# Patient Record
Sex: Female | Born: 1980 | Race: Black or African American | Hispanic: No | Marital: Single | Smoking: Former smoker
Health system: Southern US, Community
[De-identification: ages and names within clinical notes are randomized; demographics above are authoritative.]

## PROBLEM LIST (undated history)

## (undated) ENCOUNTER — Inpatient Hospital Stay (HOSPITAL_COMMUNITY): Payer: Self-pay

## (undated) ENCOUNTER — Emergency Department (HOSPITAL_COMMUNITY): Disposition: A | Discharge status: Home / Self Care | Payer: Self-pay

## (undated) ENCOUNTER — Emergency Department (HOSPITAL_COMMUNITY): Payer: Medicaid Other | Source: Home / Self Care

## (undated) DIAGNOSIS — F419 Anxiety disorder, unspecified: Secondary | ICD-10-CM

## (undated) DIAGNOSIS — F329 Major depressive disorder, single episode, unspecified: Secondary | ICD-10-CM

## (undated) DIAGNOSIS — K589 Irritable bowel syndrome without diarrhea: Secondary | ICD-10-CM

## (undated) DIAGNOSIS — Z87442 Personal history of urinary calculi: Secondary | ICD-10-CM

## (undated) DIAGNOSIS — L309 Dermatitis, unspecified: Secondary | ICD-10-CM

## (undated) DIAGNOSIS — N200 Calculus of kidney: Secondary | ICD-10-CM

## (undated) DIAGNOSIS — R51 Headache: Secondary | ICD-10-CM

## (undated) DIAGNOSIS — J302 Other seasonal allergic rhinitis: Secondary | ICD-10-CM

## (undated) DIAGNOSIS — Z8709 Personal history of other diseases of the respiratory system: Secondary | ICD-10-CM

## (undated) DIAGNOSIS — R519 Headache, unspecified: Secondary | ICD-10-CM

## (undated) DIAGNOSIS — R509 Fever, unspecified: Secondary | ICD-10-CM

## (undated) DIAGNOSIS — B009 Herpesviral infection, unspecified: Secondary | ICD-10-CM

## (undated) DIAGNOSIS — O24419 Gestational diabetes mellitus in pregnancy, unspecified control: Secondary | ICD-10-CM

## (undated) DIAGNOSIS — F32A Depression, unspecified: Secondary | ICD-10-CM

## (undated) DIAGNOSIS — J189 Pneumonia, unspecified organism: Secondary | ICD-10-CM

## (undated) HISTORY — DX: Irritable bowel syndrome, unspecified: K58.9

## (undated) HISTORY — DX: Headache, unspecified: R51.9

## (undated) HISTORY — PX: WISDOM TOOTH EXTRACTION: SHX21

## (undated) HISTORY — PX: HERNIA REPAIR: SHX51

## (undated) HISTORY — DX: Headache: R51

## (undated) HISTORY — PX: TMJ ARTHROPLASTY: SHX1066

---
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
## 2002-01-15 ENCOUNTER — Encounter: Admission: RE | Admit: 2002-01-15 | Discharge: 2002-01-15 | Payer: Self-pay | Admitting: Family Medicine

## 2002-02-05 ENCOUNTER — Encounter: Admission: RE | Admit: 2002-02-05 | Discharge: 2002-02-05 | Payer: Self-pay | Admitting: Family Medicine

## 2002-03-07 ENCOUNTER — Encounter: Admission: RE | Admit: 2002-03-07 | Discharge: 2002-03-07 | Payer: Self-pay | Admitting: Family Medicine

## 2002-03-10 ENCOUNTER — Encounter: Admission: RE | Admit: 2002-03-10 | Discharge: 2002-03-10 | Payer: Self-pay | Admitting: Family Medicine

## 2002-03-16 ENCOUNTER — Emergency Department (HOSPITAL_COMMUNITY): Admission: EM | Admit: 2002-03-16 | Discharge: 2002-03-16 | Payer: Self-pay | Admitting: Emergency Medicine

## 2002-04-03 ENCOUNTER — Encounter: Admission: RE | Admit: 2002-04-03 | Discharge: 2002-04-03 | Payer: Self-pay | Admitting: Family Medicine

## 2002-04-04 ENCOUNTER — Emergency Department (HOSPITAL_COMMUNITY): Admission: EM | Admit: 2002-04-04 | Discharge: 2002-04-04 | Payer: Self-pay | Admitting: Emergency Medicine

## 2002-05-29 ENCOUNTER — Inpatient Hospital Stay (HOSPITAL_COMMUNITY): Admission: AD | Admit: 2002-05-29 | Discharge: 2002-05-29 | Payer: Self-pay | Admitting: *Deleted

## 2002-06-04 ENCOUNTER — Encounter: Admission: RE | Admit: 2002-06-04 | Discharge: 2002-06-04 | Payer: Self-pay | Admitting: Family Medicine

## 2002-06-26 ENCOUNTER — Encounter: Admission: RE | Admit: 2002-06-26 | Discharge: 2002-06-26 | Payer: Self-pay | Admitting: Family Medicine

## 2002-06-30 ENCOUNTER — Inpatient Hospital Stay (HOSPITAL_COMMUNITY): Admission: AD | Admit: 2002-06-30 | Discharge: 2002-06-30 | Payer: Self-pay | Admitting: *Deleted

## 2002-07-21 ENCOUNTER — Emergency Department (HOSPITAL_COMMUNITY): Admission: EM | Admit: 2002-07-21 | Discharge: 2002-07-21 | Payer: Self-pay | Admitting: Emergency Medicine

## 2002-08-11 ENCOUNTER — Encounter: Admission: RE | Admit: 2002-08-11 | Discharge: 2002-08-11 | Payer: Self-pay | Admitting: Family Medicine

## 2002-10-09 ENCOUNTER — Emergency Department (HOSPITAL_COMMUNITY): Admission: EM | Admit: 2002-10-09 | Discharge: 2002-10-09 | Payer: Self-pay | Admitting: Emergency Medicine

## 2002-12-24 ENCOUNTER — Encounter: Admission: RE | Admit: 2002-12-24 | Discharge: 2002-12-24 | Payer: Self-pay | Admitting: Family Medicine

## 2002-12-24 ENCOUNTER — Other Ambulatory Visit: Admission: RE | Admit: 2002-12-24 | Discharge: 2002-12-24 | Payer: Self-pay | Admitting: Family Medicine

## 2003-01-15 ENCOUNTER — Encounter: Payer: Self-pay | Admitting: Emergency Medicine

## 2003-01-15 ENCOUNTER — Emergency Department (HOSPITAL_COMMUNITY): Admission: EM | Admit: 2003-01-15 | Discharge: 2003-01-15 | Payer: Self-pay | Admitting: Emergency Medicine

## 2003-01-16 ENCOUNTER — Encounter: Payer: Self-pay | Admitting: Emergency Medicine

## 2003-01-21 ENCOUNTER — Encounter: Payer: Self-pay | Admitting: Obstetrics and Gynecology

## 2003-01-21 ENCOUNTER — Inpatient Hospital Stay (HOSPITAL_COMMUNITY): Admission: AD | Admit: 2003-01-21 | Discharge: 2003-01-21 | Payer: Self-pay | Admitting: Obstetrics and Gynecology

## 2003-01-28 ENCOUNTER — Encounter: Payer: Self-pay | Admitting: Obstetrics and Gynecology

## 2003-01-28 ENCOUNTER — Inpatient Hospital Stay (HOSPITAL_COMMUNITY): Admission: AD | Admit: 2003-01-28 | Discharge: 2003-01-28 | Payer: Self-pay | Admitting: Obstetrics and Gynecology

## 2003-01-28 ENCOUNTER — Ambulatory Visit (HOSPITAL_COMMUNITY): Admission: RE | Admit: 2003-01-28 | Discharge: 2003-01-28 | Payer: Self-pay | Admitting: Obstetrics and Gynecology

## 2003-02-11 ENCOUNTER — Encounter: Admission: RE | Admit: 2003-02-11 | Discharge: 2003-02-11 | Payer: Self-pay

## 2003-02-17 ENCOUNTER — Inpatient Hospital Stay (HOSPITAL_COMMUNITY): Admission: AD | Admit: 2003-02-17 | Discharge: 2003-02-17 | Payer: Self-pay | Admitting: *Deleted

## 2003-02-18 ENCOUNTER — Encounter: Admission: RE | Admit: 2003-02-18 | Discharge: 2003-02-18 | Payer: Self-pay | Admitting: Family Medicine

## 2003-02-18 ENCOUNTER — Other Ambulatory Visit: Admission: RE | Admit: 2003-02-18 | Discharge: 2003-02-18 | Payer: Self-pay | Admitting: Family Medicine

## 2003-03-02 ENCOUNTER — Inpatient Hospital Stay (HOSPITAL_COMMUNITY): Admission: AD | Admit: 2003-03-02 | Discharge: 2003-03-02 | Payer: Self-pay | Admitting: Family Medicine

## 2003-03-06 ENCOUNTER — Inpatient Hospital Stay (HOSPITAL_COMMUNITY): Admission: AD | Admit: 2003-03-06 | Discharge: 2003-03-06 | Payer: Self-pay | Admitting: *Deleted

## 2003-03-20 ENCOUNTER — Encounter: Admission: RE | Admit: 2003-03-20 | Discharge: 2003-03-20 | Payer: Self-pay | Admitting: Family Medicine

## 2003-04-23 ENCOUNTER — Encounter: Admission: RE | Admit: 2003-04-23 | Discharge: 2003-04-23 | Payer: Self-pay | Admitting: Family Medicine

## 2003-04-23 ENCOUNTER — Inpatient Hospital Stay (HOSPITAL_COMMUNITY): Admission: AD | Admit: 2003-04-23 | Discharge: 2003-04-23 | Payer: Self-pay | Admitting: Obstetrics and Gynecology

## 2003-04-27 ENCOUNTER — Ambulatory Visit (HOSPITAL_COMMUNITY): Admission: RE | Admit: 2003-04-27 | Discharge: 2003-04-27 | Payer: Self-pay | Admitting: *Deleted

## 2003-04-29 ENCOUNTER — Encounter: Admission: RE | Admit: 2003-04-29 | Discharge: 2003-04-29 | Payer: Self-pay | Admitting: Family Medicine

## 2003-05-03 ENCOUNTER — Inpatient Hospital Stay (HOSPITAL_COMMUNITY): Admission: AD | Admit: 2003-05-03 | Discharge: 2003-05-03 | Payer: Self-pay | Admitting: *Deleted

## 2003-05-25 ENCOUNTER — Encounter: Admission: RE | Admit: 2003-05-25 | Discharge: 2003-05-25 | Payer: Self-pay | Admitting: Family Medicine

## 2003-06-15 ENCOUNTER — Inpatient Hospital Stay (HOSPITAL_COMMUNITY): Admission: AD | Admit: 2003-06-15 | Discharge: 2003-06-15 | Payer: Self-pay | Admitting: Obstetrics & Gynecology

## 2003-06-22 ENCOUNTER — Encounter: Admission: RE | Admit: 2003-06-22 | Discharge: 2003-06-22 | Payer: Self-pay | Admitting: Family Medicine

## 2003-06-28 ENCOUNTER — Inpatient Hospital Stay (HOSPITAL_COMMUNITY): Admission: AD | Admit: 2003-06-28 | Discharge: 2003-06-28 | Payer: Self-pay | Admitting: Obstetrics and Gynecology

## 2003-07-10 ENCOUNTER — Encounter: Admission: RE | Admit: 2003-07-10 | Discharge: 2003-07-10 | Payer: Self-pay | Admitting: Family Medicine

## 2003-07-14 ENCOUNTER — Inpatient Hospital Stay (HOSPITAL_COMMUNITY): Admission: AD | Admit: 2003-07-14 | Discharge: 2003-07-14 | Payer: Self-pay | Admitting: *Deleted

## 2003-07-24 ENCOUNTER — Encounter: Admission: RE | Admit: 2003-07-24 | Discharge: 2003-07-24 | Payer: Self-pay | Admitting: Family Medicine

## 2003-07-25 ENCOUNTER — Inpatient Hospital Stay (HOSPITAL_COMMUNITY): Admission: AD | Admit: 2003-07-25 | Discharge: 2003-07-26 | Payer: Self-pay | Admitting: *Deleted

## 2003-07-25 ENCOUNTER — Encounter: Payer: Self-pay | Admitting: Emergency Medicine

## 2003-08-07 ENCOUNTER — Encounter: Admission: RE | Admit: 2003-08-07 | Discharge: 2003-08-07 | Payer: Self-pay | Admitting: Family Medicine

## 2003-08-10 ENCOUNTER — Inpatient Hospital Stay (HOSPITAL_COMMUNITY): Admission: AD | Admit: 2003-08-10 | Discharge: 2003-08-11 | Payer: Self-pay | Admitting: Obstetrics and Gynecology

## 2003-08-13 ENCOUNTER — Inpatient Hospital Stay (HOSPITAL_COMMUNITY): Admission: AD | Admit: 2003-08-13 | Discharge: 2003-08-13 | Payer: Self-pay | Admitting: Obstetrics & Gynecology

## 2003-08-14 ENCOUNTER — Encounter: Payer: Self-pay | Admitting: Obstetrics & Gynecology

## 2003-08-14 ENCOUNTER — Inpatient Hospital Stay (HOSPITAL_COMMUNITY): Admission: AD | Admit: 2003-08-14 | Discharge: 2003-08-14 | Payer: Self-pay | Admitting: Obstetrics & Gynecology

## 2003-08-21 ENCOUNTER — Encounter: Admission: RE | Admit: 2003-08-21 | Discharge: 2003-08-21 | Payer: Self-pay | Admitting: Family Medicine

## 2003-08-27 ENCOUNTER — Inpatient Hospital Stay (HOSPITAL_COMMUNITY): Admission: AD | Admit: 2003-08-27 | Discharge: 2003-08-27 | Payer: Self-pay | Admitting: Family Medicine

## 2003-09-02 ENCOUNTER — Encounter: Admission: RE | Admit: 2003-09-02 | Discharge: 2003-09-02 | Payer: Self-pay | Admitting: Sports Medicine

## 2003-09-09 ENCOUNTER — Encounter: Admission: RE | Admit: 2003-09-09 | Discharge: 2003-09-09 | Payer: Self-pay | Admitting: Family Medicine

## 2003-09-09 ENCOUNTER — Inpatient Hospital Stay (HOSPITAL_COMMUNITY): Admission: AD | Admit: 2003-09-09 | Discharge: 2003-09-09 | Payer: Self-pay | Admitting: Obstetrics and Gynecology

## 2003-09-16 ENCOUNTER — Encounter: Admission: RE | Admit: 2003-09-16 | Discharge: 2003-09-16 | Payer: Self-pay | Admitting: Family Medicine

## 2003-09-18 ENCOUNTER — Encounter (INDEPENDENT_AMBULATORY_CARE_PROVIDER_SITE_OTHER): Payer: Self-pay

## 2003-09-18 ENCOUNTER — Inpatient Hospital Stay (HOSPITAL_COMMUNITY): Admission: RE | Admit: 2003-09-18 | Discharge: 2003-09-21 | Payer: Self-pay | Admitting: Family Medicine

## 2003-11-23 ENCOUNTER — Encounter: Admission: RE | Admit: 2003-11-23 | Discharge: 2003-11-23 | Payer: Self-pay | Admitting: Family Medicine

## 2003-12-30 ENCOUNTER — Encounter: Admission: RE | Admit: 2003-12-30 | Discharge: 2003-12-30 | Payer: Self-pay | Admitting: Family Medicine

## 2004-02-24 ENCOUNTER — Encounter: Admission: RE | Admit: 2004-02-24 | Discharge: 2004-02-24 | Payer: Self-pay | Admitting: Family Medicine

## 2004-03-16 ENCOUNTER — Encounter: Admission: RE | Admit: 2004-03-16 | Discharge: 2004-03-16 | Payer: Self-pay | Admitting: Family Medicine

## 2004-03-30 ENCOUNTER — Encounter: Admission: RE | Admit: 2004-03-30 | Discharge: 2004-03-30 | Payer: Self-pay | Admitting: Family Medicine

## 2004-04-13 ENCOUNTER — Encounter: Admission: RE | Admit: 2004-04-13 | Discharge: 2004-04-13 | Payer: Self-pay | Admitting: Family Medicine

## 2004-05-24 ENCOUNTER — Emergency Department (HOSPITAL_COMMUNITY): Admission: EM | Admit: 2004-05-24 | Discharge: 2004-05-24 | Payer: Self-pay | Admitting: Emergency Medicine

## 2004-06-26 ENCOUNTER — Emergency Department (HOSPITAL_COMMUNITY): Admission: EM | Admit: 2004-06-26 | Discharge: 2004-06-26 | Payer: Self-pay | Admitting: Emergency Medicine

## 2004-06-27 ENCOUNTER — Encounter (INDEPENDENT_AMBULATORY_CARE_PROVIDER_SITE_OTHER): Payer: Self-pay | Admitting: *Deleted

## 2004-06-27 LAB — CONVERTED CEMR LAB

## 2004-07-20 ENCOUNTER — Encounter (INDEPENDENT_AMBULATORY_CARE_PROVIDER_SITE_OTHER): Payer: Self-pay | Admitting: Family Medicine

## 2004-07-20 ENCOUNTER — Encounter: Admission: RE | Admit: 2004-07-20 | Discharge: 2004-07-20 | Payer: Self-pay | Admitting: Family Medicine

## 2004-07-27 ENCOUNTER — Encounter: Admission: RE | Admit: 2004-07-27 | Discharge: 2004-07-27 | Payer: Self-pay | Admitting: Family Medicine

## 2004-08-31 ENCOUNTER — Emergency Department (HOSPITAL_COMMUNITY): Admission: EM | Admit: 2004-08-31 | Discharge: 2004-08-31 | Payer: Self-pay | Admitting: Family Medicine

## 2004-09-29 ENCOUNTER — Ambulatory Visit: Payer: Self-pay | Admitting: Family Medicine

## 2004-10-05 ENCOUNTER — Ambulatory Visit: Payer: Self-pay | Admitting: Family Medicine

## 2004-11-17 ENCOUNTER — Emergency Department (HOSPITAL_COMMUNITY): Admission: EM | Admit: 2004-11-17 | Discharge: 2004-11-17 | Payer: Self-pay | Admitting: Family Medicine

## 2004-11-26 ENCOUNTER — Emergency Department (HOSPITAL_COMMUNITY): Admission: EM | Admit: 2004-11-26 | Discharge: 2004-11-26 | Payer: Self-pay | Admitting: Family Medicine

## 2005-08-24 ENCOUNTER — Inpatient Hospital Stay (HOSPITAL_COMMUNITY): Admission: AD | Admit: 2005-08-24 | Discharge: 2005-08-24 | Payer: Self-pay | Admitting: Obstetrics

## 2005-12-06 ENCOUNTER — Inpatient Hospital Stay (HOSPITAL_COMMUNITY): Admission: AD | Admit: 2005-12-06 | Discharge: 2005-12-06 | Payer: Self-pay | Admitting: Obstetrics

## 2005-12-11 ENCOUNTER — Emergency Department (HOSPITAL_COMMUNITY): Admission: EM | Admit: 2005-12-11 | Discharge: 2005-12-11 | Payer: Self-pay | Admitting: Family Medicine

## 2006-01-10 ENCOUNTER — Ambulatory Visit (HOSPITAL_COMMUNITY): Admission: RE | Admit: 2006-01-10 | Discharge: 2006-01-10 | Payer: Self-pay | Admitting: Obstetrics

## 2006-03-14 ENCOUNTER — Ambulatory Visit (HOSPITAL_BASED_OUTPATIENT_CLINIC_OR_DEPARTMENT_OTHER): Admission: RE | Admit: 2006-03-14 | Discharge: 2006-03-14 | Payer: Self-pay | Admitting: General Surgery

## 2006-04-13 ENCOUNTER — Inpatient Hospital Stay (HOSPITAL_COMMUNITY): Admission: AD | Admit: 2006-04-13 | Discharge: 2006-04-13 | Payer: Self-pay | Admitting: Obstetrics & Gynecology

## 2006-06-23 ENCOUNTER — Emergency Department (HOSPITAL_COMMUNITY): Admission: EM | Admit: 2006-06-23 | Discharge: 2006-06-23 | Payer: Self-pay | Admitting: Emergency Medicine

## 2006-10-06 ENCOUNTER — Emergency Department (HOSPITAL_COMMUNITY): Admission: EM | Admit: 2006-10-06 | Discharge: 2006-10-06 | Payer: Self-pay | Admitting: Family Medicine

## 2006-11-27 DIAGNOSIS — B009 Herpesviral infection, unspecified: Secondary | ICD-10-CM

## 2006-11-27 HISTORY — DX: Herpesviral infection, unspecified: B00.9

## 2007-01-03 ENCOUNTER — Encounter: Admission: RE | Admit: 2007-01-03 | Discharge: 2007-01-03 | Payer: Self-pay | Admitting: Internal Medicine

## 2007-01-16 ENCOUNTER — Encounter: Admission: RE | Admit: 2007-01-16 | Discharge: 2007-01-16 | Payer: Self-pay | Admitting: Internal Medicine

## 2007-01-24 DIAGNOSIS — A630 Anogenital (venereal) warts: Secondary | ICD-10-CM | POA: Insufficient documentation

## 2007-01-25 ENCOUNTER — Encounter (INDEPENDENT_AMBULATORY_CARE_PROVIDER_SITE_OTHER): Payer: Self-pay | Admitting: *Deleted

## 2007-02-14 ENCOUNTER — Emergency Department (HOSPITAL_COMMUNITY): Admission: EM | Admit: 2007-02-14 | Discharge: 2007-02-14 | Payer: Self-pay | Admitting: Family Medicine

## 2007-03-18 ENCOUNTER — Emergency Department (HOSPITAL_COMMUNITY): Admission: EM | Admit: 2007-03-18 | Discharge: 2007-03-18 | Payer: Self-pay | Admitting: Family Medicine

## 2007-05-27 ENCOUNTER — Emergency Department (HOSPITAL_COMMUNITY): Admission: EM | Admit: 2007-05-27 | Discharge: 2007-05-27 | Payer: Self-pay | Admitting: Emergency Medicine

## 2007-05-31 ENCOUNTER — Inpatient Hospital Stay (HOSPITAL_COMMUNITY): Admission: AD | Admit: 2007-05-31 | Discharge: 2007-05-31 | Payer: Self-pay | Admitting: Obstetrics & Gynecology

## 2007-06-01 ENCOUNTER — Inpatient Hospital Stay (HOSPITAL_COMMUNITY): Admission: AD | Admit: 2007-06-01 | Discharge: 2007-06-01 | Payer: Self-pay | Admitting: Obstetrics

## 2007-06-01 ENCOUNTER — Emergency Department (HOSPITAL_COMMUNITY): Admission: EM | Admit: 2007-06-01 | Discharge: 2007-06-01 | Payer: Self-pay | Admitting: Emergency Medicine

## 2007-06-30 ENCOUNTER — Inpatient Hospital Stay (HOSPITAL_COMMUNITY): Admission: AD | Admit: 2007-06-30 | Discharge: 2007-06-30 | Payer: Self-pay | Admitting: Obstetrics

## 2007-09-02 ENCOUNTER — Other Ambulatory Visit: Payer: Self-pay

## 2007-09-02 ENCOUNTER — Other Ambulatory Visit: Payer: Self-pay | Admitting: Emergency Medicine

## 2007-10-18 ENCOUNTER — Emergency Department (HOSPITAL_COMMUNITY): Admission: EM | Admit: 2007-10-18 | Discharge: 2007-10-18 | Payer: Self-pay | Admitting: Family Medicine

## 2007-12-22 ENCOUNTER — Emergency Department (HOSPITAL_COMMUNITY): Admission: EM | Admit: 2007-12-22 | Discharge: 2007-12-22 | Payer: Self-pay | Admitting: Emergency Medicine

## 2008-02-11 ENCOUNTER — Inpatient Hospital Stay (HOSPITAL_COMMUNITY): Admission: AD | Admit: 2008-02-11 | Discharge: 2008-02-11 | Payer: Self-pay | Admitting: Obstetrics & Gynecology

## 2008-02-18 ENCOUNTER — Inpatient Hospital Stay (HOSPITAL_COMMUNITY): Admission: AD | Admit: 2008-02-18 | Discharge: 2008-02-18 | Payer: Self-pay | Admitting: Obstetrics & Gynecology

## 2008-03-10 ENCOUNTER — Emergency Department (HOSPITAL_COMMUNITY): Admission: EM | Admit: 2008-03-10 | Discharge: 2008-03-10 | Payer: Self-pay | Admitting: Emergency Medicine

## 2008-05-06 ENCOUNTER — Emergency Department (HOSPITAL_COMMUNITY): Admission: EM | Admit: 2008-05-06 | Discharge: 2008-05-06 | Payer: Self-pay | Admitting: Family Medicine

## 2008-06-22 ENCOUNTER — Encounter: Payer: Self-pay | Admitting: *Deleted

## 2008-08-25 ENCOUNTER — Emergency Department (HOSPITAL_COMMUNITY): Admission: EM | Admit: 2008-08-25 | Discharge: 2008-08-25 | Payer: Self-pay | Admitting: Emergency Medicine

## 2008-09-07 ENCOUNTER — Emergency Department (HOSPITAL_COMMUNITY): Admission: EM | Admit: 2008-09-07 | Discharge: 2008-09-07 | Payer: Self-pay | Admitting: Emergency Medicine

## 2008-09-21 ENCOUNTER — Inpatient Hospital Stay (HOSPITAL_COMMUNITY): Admission: AD | Admit: 2008-09-21 | Discharge: 2008-09-21 | Payer: Self-pay | Admitting: Obstetrics

## 2009-02-01 ENCOUNTER — Emergency Department (HOSPITAL_COMMUNITY): Admission: EM | Admit: 2009-02-01 | Discharge: 2009-02-01 | Payer: Self-pay | Admitting: Family Medicine

## 2009-02-24 ENCOUNTER — Emergency Department (HOSPITAL_COMMUNITY): Admission: EM | Admit: 2009-02-24 | Discharge: 2009-02-24 | Payer: Self-pay | Admitting: Emergency Medicine

## 2009-02-26 ENCOUNTER — Inpatient Hospital Stay (HOSPITAL_COMMUNITY): Admission: AD | Admit: 2009-02-26 | Discharge: 2009-02-26 | Payer: Self-pay | Admitting: Obstetrics & Gynecology

## 2009-05-12 ENCOUNTER — Emergency Department (HOSPITAL_COMMUNITY): Admission: EM | Admit: 2009-05-12 | Discharge: 2009-05-12 | Payer: Self-pay | Admitting: Family Medicine

## 2009-05-25 ENCOUNTER — Inpatient Hospital Stay (HOSPITAL_COMMUNITY): Admission: AD | Admit: 2009-05-25 | Discharge: 2009-05-25 | Payer: Self-pay | Admitting: Obstetrics & Gynecology

## 2009-07-23 ENCOUNTER — Emergency Department (HOSPITAL_COMMUNITY): Admission: EM | Admit: 2009-07-23 | Discharge: 2009-07-23 | Payer: Self-pay | Admitting: Family Medicine

## 2009-09-21 ENCOUNTER — Emergency Department (HOSPITAL_COMMUNITY): Admission: EM | Admit: 2009-09-21 | Discharge: 2009-09-21 | Payer: Self-pay | Admitting: Family Medicine

## 2010-02-08 ENCOUNTER — Emergency Department (HOSPITAL_COMMUNITY): Admission: EM | Admit: 2010-02-08 | Discharge: 2010-02-08 | Payer: Self-pay | Admitting: Emergency Medicine

## 2010-02-16 ENCOUNTER — Ambulatory Visit (HOSPITAL_COMMUNITY): Admission: RE | Admit: 2010-02-16 | Discharge: 2010-02-16 | Payer: Self-pay | Admitting: Cardiology

## 2010-02-16 ENCOUNTER — Encounter (INDEPENDENT_AMBULATORY_CARE_PROVIDER_SITE_OTHER): Payer: Self-pay | Admitting: Cardiology

## 2010-04-18 ENCOUNTER — Encounter: Admission: RE | Admit: 2010-04-18 | Discharge: 2010-04-18 | Payer: Self-pay | Admitting: Gastroenterology

## 2010-05-24 ENCOUNTER — Ambulatory Visit (HOSPITAL_COMMUNITY): Admission: RE | Admit: 2010-05-24 | Discharge: 2010-05-24 | Payer: Self-pay | Admitting: Obstetrics

## 2010-06-16 ENCOUNTER — Emergency Department (HOSPITAL_COMMUNITY): Admission: EM | Admit: 2010-06-16 | Discharge: 2010-06-16 | Payer: Self-pay | Admitting: Emergency Medicine

## 2010-07-11 ENCOUNTER — Encounter: Admission: RE | Admit: 2010-07-11 | Discharge: 2010-07-11 | Payer: Self-pay | Admitting: Oral Surgery

## 2010-11-22 ENCOUNTER — Encounter
Admission: RE | Admit: 2010-11-22 | Discharge: 2010-11-22 | Payer: Self-pay | Source: Home / Self Care | Attending: Obstetrics | Admitting: Obstetrics

## 2010-12-18 ENCOUNTER — Encounter: Payer: Self-pay | Admitting: Family Medicine

## 2010-12-18 ENCOUNTER — Encounter: Payer: Self-pay | Admitting: Cardiology

## 2010-12-18 ENCOUNTER — Encounter: Payer: Self-pay | Admitting: Gastroenterology

## 2011-01-26 DIAGNOSIS — R509 Fever, unspecified: Secondary | ICD-10-CM

## 2011-01-26 HISTORY — DX: Fever, unspecified: R50.9

## 2011-01-30 ENCOUNTER — Inpatient Hospital Stay (INDEPENDENT_AMBULATORY_CARE_PROVIDER_SITE_OTHER)
Admission: RE | Admit: 2011-01-30 | Discharge: 2011-01-30 | Disposition: A | Discharge status: Home / Self Care | Payer: Medicaid Other | Source: Ambulatory Visit | Attending: Family Medicine | Admitting: Family Medicine

## 2011-01-30 DIAGNOSIS — B373 Candidiasis of vulva and vagina: Secondary | ICD-10-CM

## 2011-01-30 DIAGNOSIS — B3731 Acute candidiasis of vulva and vagina: Secondary | ICD-10-CM

## 2011-01-30 DIAGNOSIS — N760 Acute vaginitis: Secondary | ICD-10-CM

## 2011-01-30 LAB — POCT URINALYSIS DIPSTICK
Ketones, ur: NEGATIVE mg/dL
Nitrite: NEGATIVE
Protein, ur: NEGATIVE mg/dL
Specific Gravity, Urine: >=1.03 (ref 1.005–1.030)

## 2011-01-30 LAB — WET PREP, GENITAL
Clue Cells Wet Prep HPF POC: NONE SEEN
Trich, Wet Prep: NONE SEEN

## 2011-01-30 LAB — POCT PREGNANCY, URINE: Preg Test, Ur: NEGATIVE

## 2011-02-11 ENCOUNTER — Emergency Department (HOSPITAL_COMMUNITY)
Admission: EM | Admit: 2011-02-11 | Discharge: 2011-02-11 | Disposition: A | Discharge status: Home / Self Care | Payer: Medicaid Other | Attending: Emergency Medicine | Admitting: Emergency Medicine

## 2011-02-11 ENCOUNTER — Emergency Department (HOSPITAL_COMMUNITY): Payer: Medicaid Other

## 2011-02-11 DIAGNOSIS — N390 Urinary tract infection, site not specified: Secondary | ICD-10-CM | POA: Insufficient documentation

## 2011-02-11 DIAGNOSIS — R509 Fever, unspecified: Secondary | ICD-10-CM | POA: Insufficient documentation

## 2011-02-11 LAB — URINE MICROSCOPIC-ADD ON

## 2011-02-11 LAB — URINALYSIS, ROUTINE W REFLEX MICROSCOPIC
Leukocytes, UA: NEGATIVE
Protein, ur: 100 mg/dL — AB
Urobilinogen, UA: 0.2 mg/dL (ref 0.0–1.0)

## 2011-02-11 LAB — POCT PREGNANCY, URINE: Preg Test, Ur: NEGATIVE

## 2011-02-13 ENCOUNTER — Emergency Department (HOSPITAL_COMMUNITY): Payer: Medicaid Other

## 2011-02-13 ENCOUNTER — Emergency Department (HOSPITAL_COMMUNITY)
Admission: EM | Admit: 2011-02-13 | Discharge: 2011-02-13 | Disposition: A | Discharge status: Home / Self Care | Payer: Medicaid Other | Attending: Emergency Medicine | Admitting: Emergency Medicine

## 2011-02-13 DIAGNOSIS — R509 Fever, unspecified: Secondary | ICD-10-CM | POA: Insufficient documentation

## 2011-02-13 DIAGNOSIS — J069 Acute upper respiratory infection, unspecified: Secondary | ICD-10-CM | POA: Insufficient documentation

## 2011-02-13 DIAGNOSIS — R079 Chest pain, unspecified: Secondary | ICD-10-CM | POA: Insufficient documentation

## 2011-02-13 LAB — URINE MICROSCOPIC-ADD ON

## 2011-02-13 LAB — DIFFERENTIAL
Eosinophils Relative: 9 % — ABNORMAL HIGH (ref 0–5)
Monocytes Relative: 5 % (ref 3–12)
Neutrophils Relative %: 58 % (ref 43–77)

## 2011-02-13 LAB — CBC
HCT: 40.5 % (ref 36.0–46.0)
Hemoglobin: 13.2 g/dL (ref 12.0–15.0)
WBC: 5.8 10*3/uL (ref 4.0–10.5)

## 2011-02-13 LAB — POCT I-STAT, CHEM 8
BUN: 8 mg/dL (ref 6–23)
Chloride: 106 mEq/L (ref 96–112)
Glucose, Bld: 97 mg/dL (ref 70–99)
HCT: 41 % (ref 36.0–46.0)
Potassium: 3.2 mEq/L — ABNORMAL LOW (ref 3.5–5.1)

## 2011-02-13 LAB — URINALYSIS, ROUTINE W REFLEX MICROSCOPIC
Bilirubin Urine: NEGATIVE
Ketones, ur: NEGATIVE mg/dL
Nitrite: NEGATIVE
Protein, ur: 30 mg/dL — AB
pH: 6 (ref 5.0–8.0)

## 2011-02-13 LAB — POCT PREGNANCY, URINE: Preg Test, Ur: NEGATIVE

## 2011-02-15 ENCOUNTER — Emergency Department (HOSPITAL_COMMUNITY): Payer: Medicaid Other

## 2011-02-15 ENCOUNTER — Inpatient Hospital Stay (HOSPITAL_COMMUNITY)
Admission: EM | Admit: 2011-02-15 | Discharge: 2011-02-20 | DRG: 866 | Disposition: A | Discharge status: Home / Self Care | Payer: Medicaid Other | Attending: Internal Medicine | Admitting: Internal Medicine

## 2011-02-15 DIAGNOSIS — B009 Herpesviral infection, unspecified: Secondary | ICD-10-CM | POA: Diagnosis present

## 2011-02-15 DIAGNOSIS — B259 Cytomegaloviral disease, unspecified: Secondary | ICD-10-CM | POA: Diagnosis present

## 2011-02-15 DIAGNOSIS — R042 Hemoptysis: Secondary | ICD-10-CM | POA: Diagnosis present

## 2011-02-15 DIAGNOSIS — B2790 Infectious mononucleosis, unspecified without complication: Principal | ICD-10-CM | POA: Diagnosis present

## 2011-02-15 DIAGNOSIS — B2710 Cytomegaloviral mononucleosis without complications: Secondary | ICD-10-CM | POA: Insufficient documentation

## 2011-02-15 LAB — DIFFERENTIAL
Eosinophils Relative: 7 % — ABNORMAL HIGH (ref 0–5)
Lymphs Abs: 1.9 10*3/uL (ref 0.7–4.0)
Monocytes Absolute: 0.7 10*3/uL (ref 0.1–1.0)

## 2011-02-15 LAB — URINE MICROSCOPIC-ADD ON

## 2011-02-15 LAB — COMPREHENSIVE METABOLIC PANEL
ALT: 225 U/L — ABNORMAL HIGH (ref 0–35)
BUN: 7 mg/dL (ref 6–23)
Calcium: 8.7 mg/dL (ref 8.4–10.5)
Glucose, Bld: 102 mg/dL — ABNORMAL HIGH (ref 70–99)
Sodium: 141 mEq/L (ref 135–145)
Total Protein: 6.5 g/dL (ref 6.0–8.3)

## 2011-02-15 LAB — CBC
HCT: 39.7 % (ref 36.0–46.0)
MCHC: 33 g/dL (ref 30.0–36.0)
RDW: 14 % (ref 11.5–15.5)

## 2011-02-15 LAB — URINALYSIS, ROUTINE W REFLEX MICROSCOPIC
Glucose, UA: NEGATIVE mg/dL
Specific Gravity, Urine: 1.016 (ref 1.005–1.030)
pH: 6 (ref 5.0–8.0)

## 2011-02-16 ENCOUNTER — Inpatient Hospital Stay (HOSPITAL_COMMUNITY): Payer: Medicaid Other

## 2011-02-16 DIAGNOSIS — R509 Fever, unspecified: Secondary | ICD-10-CM

## 2011-02-16 DIAGNOSIS — K759 Inflammatory liver disease, unspecified: Secondary | ICD-10-CM

## 2011-02-16 LAB — COMPREHENSIVE METABOLIC PANEL
ALT: 263 U/L — ABNORMAL HIGH (ref 0–35)
Albumin: 2.9 g/dL — ABNORMAL LOW (ref 3.5–5.2)
Alkaline Phosphatase: 96 U/L (ref 39–117)
Calcium: 8.1 mg/dL — ABNORMAL LOW (ref 8.4–10.5)
Potassium: 3.5 mEq/L (ref 3.5–5.1)
Sodium: 144 mEq/L (ref 135–145)
Total Protein: 5.8 g/dL — ABNORMAL LOW (ref 6.0–8.3)

## 2011-02-16 LAB — PROTIME-INR
INR: 1.13 (ref 0.00–1.49)
Prothrombin Time: 14.7 seconds (ref 11.6–15.2)

## 2011-02-16 LAB — DRUGS OF ABUSE SCREEN W/O ALC, ROUTINE URINE
Creatinine,U: 189.3 mg/dL
Marijuana Metabolite: NEGATIVE
Methadone: NEGATIVE
Opiate Screen, Urine: NEGATIVE
Propoxyphene: NEGATIVE

## 2011-02-16 LAB — APTT: aPTT: 32 seconds (ref 24–37)

## 2011-02-16 LAB — SEDIMENTATION RATE: Sed Rate: 6 mm/hr (ref 0–22)

## 2011-02-16 LAB — HEPATITIS PANEL, ACUTE
Hep A IgM: NEGATIVE
Hepatitis B Surface Ag: NEGATIVE

## 2011-02-16 LAB — DIFFERENTIAL
Basophils Absolute: 0.2 10*3/uL — ABNORMAL HIGH (ref 0.0–0.1)
Eosinophils Absolute: 0.6 10*3/uL (ref 0.0–0.7)
Monocytes Absolute: 0.3 10*3/uL (ref 0.1–1.0)
Neutrophils Relative %: 47 % (ref 43–77)

## 2011-02-16 LAB — CBC
Platelets: 186 10*3/uL (ref 150–400)
RDW: 14 % (ref 11.5–15.5)
WBC: 7.2 10*3/uL (ref 4.0–10.5)

## 2011-02-16 LAB — HEPATITIS B SURFACE ANTIGEN: Hepatitis B Surface Ag: NEGATIVE

## 2011-02-16 LAB — PHOSPHORUS: Phosphorus: 2.9 mg/dL (ref 2.3–4.6)

## 2011-02-17 DIAGNOSIS — K759 Inflammatory liver disease, unspecified: Secondary | ICD-10-CM

## 2011-02-17 DIAGNOSIS — R509 Fever, unspecified: Secondary | ICD-10-CM

## 2011-02-17 LAB — EPSTEIN-BARR VIRUS VCA ANTIBODY PANEL
EBV EA IgG: 0.77 {ISR}
EBV NA IgG: 2.23 {ISR} — ABNORMAL HIGH
EBV VCA IgG: 2.42 {ISR} — ABNORMAL HIGH

## 2011-02-17 LAB — ROCKY MTN SPOTTED FVR AB, IGG-BLOOD: RMSF IgG: 0.09 IV

## 2011-02-17 LAB — COMPREHENSIVE METABOLIC PANEL
Albumin: 2.9 g/dL — ABNORMAL LOW (ref 3.5–5.2)
BUN: 5 mg/dL — ABNORMAL LOW (ref 6–23)
Creatinine, Ser: 0.89 mg/dL (ref 0.4–1.2)
Glucose, Bld: 97 mg/dL (ref 70–99)
Total Protein: 5.8 g/dL — ABNORMAL LOW (ref 6.0–8.3)

## 2011-02-17 LAB — URINE CULTURE
Colony Count: 5000
Culture  Setup Time: 201203220954

## 2011-02-17 LAB — CBC
HCT: 36.6 % (ref 36.0–46.0)
Hemoglobin: 11.9 g/dL — ABNORMAL LOW (ref 12.0–15.0)
RBC: 4.54 MIL/uL (ref 3.87–5.11)
WBC: 8 10*3/uL (ref 4.0–10.5)

## 2011-02-17 LAB — HEPATITIS B E ANTIBODY: Hep B E Ab: NONREACTIVE

## 2011-02-17 LAB — ANA: Anti Nuclear Antibody(ANA): POSITIVE — AB

## 2011-02-17 LAB — ANTI-NUCLEAR AB-TITER (ANA TITER): ANA Titer 1: NEGATIVE

## 2011-02-18 ENCOUNTER — Encounter (HOSPITAL_COMMUNITY): Payer: Self-pay | Admitting: Radiology

## 2011-02-18 ENCOUNTER — Inpatient Hospital Stay (HOSPITAL_COMMUNITY): Payer: Medicaid Other

## 2011-02-18 LAB — CK: Total CK: 3804 U/L — ABNORMAL HIGH (ref 7–177)

## 2011-02-18 LAB — BASIC METABOLIC PANEL
CO2: 24 mEq/L (ref 19–32)
Chloride: 105 mEq/L (ref 96–112)
Creatinine, Ser: 0.92 mg/dL (ref 0.4–1.2)
GFR calc Af Amer: >60 mL/min (ref >60)
Potassium: 3.7 mEq/L (ref 3.5–5.1)

## 2011-02-18 LAB — CBC
Hemoglobin: 11.7 g/dL — ABNORMAL LOW (ref 12.0–15.0)
MCH: 26.3 pg (ref 26.0–34.0)
Platelets: 196 10*3/uL (ref 150–400)
RBC: 4.45 MIL/uL (ref 3.87–5.11)
WBC: 8.3 10*3/uL (ref 4.0–10.5)

## 2011-02-18 LAB — MAGNESIUM: Magnesium: 1.7 mg/dL (ref 1.5–2.5)

## 2011-02-18 LAB — ANGIOTENSIN CONVERTING ENZYME: Angiotensin-Converting Enzyme: 40 U/L (ref 8–52)

## 2011-02-18 LAB — LACTATE DEHYDROGENASE: LDH: 882 U/L — ABNORMAL HIGH (ref 94–250)

## 2011-02-18 LAB — RPR: RPR Ser Ql: NONREACTIVE

## 2011-02-18 LAB — HIV-1 RNA QUANT-NO REFLEX-BLD: HIV 1 RNA Quant: <20 copies/mL (ref <20)

## 2011-02-18 MED ORDER — IOHEXOL 300 MG/ML  SOLN
80.0000 mL | Freq: Once | INTRAMUSCULAR | Status: AC | PRN
  Administered 2011-02-18: 80 mL via INTRAVENOUS

## 2011-02-19 LAB — CBC
Hemoglobin: 12.5 g/dL (ref 12.0–15.0)
MCH: 26.9 pg (ref 26.0–34.0)
MCHC: 33.7 g/dL (ref 30.0–36.0)

## 2011-02-19 LAB — TOXOPLASMA GONDII ANTIBODY, IGG: Toxoplasma IgG Ratio: 0.7 IU/mL (ref <6.4)

## 2011-02-19 LAB — DIFFERENTIAL
Basophils Absolute: 0.2 10*3/uL — ABNORMAL HIGH (ref 0.0–0.1)
Eosinophils Relative: 7 % — ABNORMAL HIGH (ref 0–5)
Lymphs Abs: 2.5 10*3/uL (ref 0.7–4.0)
Monocytes Absolute: 0.8 10*3/uL (ref 0.1–1.0)

## 2011-02-19 LAB — COMPREHENSIVE METABOLIC PANEL
ALT: 344 U/L — ABNORMAL HIGH (ref 0–35)
AST: 206 U/L — ABNORMAL HIGH (ref 0–37)
Albumin: 2.8 g/dL — ABNORMAL LOW (ref 3.5–5.2)
Alkaline Phosphatase: 110 U/L (ref 39–117)
Glucose, Bld: 116 mg/dL — ABNORMAL HIGH (ref 70–99)
Potassium: 3.4 mEq/L — ABNORMAL LOW (ref 3.5–5.1)
Sodium: 138 mEq/L (ref 135–145)
Total Protein: 6 g/dL (ref 6.0–8.3)

## 2011-02-19 LAB — EXPECTORATED SPUTUM ASSESSMENT W GRAM STAIN, RFLX TO RESP C

## 2011-02-19 LAB — FERRITIN: Ferritin: 2964 ng/mL — ABNORMAL HIGH (ref 10–291)

## 2011-02-20 LAB — URINALYSIS, ROUTINE W REFLEX MICROSCOPIC
Nitrite: NEGATIVE
Specific Gravity, Urine: 1.025 (ref 1.005–1.030)
pH: 7.5 (ref 5.0–8.0)

## 2011-02-20 LAB — CBC
Platelets: 245 10*3/uL (ref 150–400)
RBC: 4.62 MIL/uL (ref 3.87–5.11)
WBC: 9.7 10*3/uL (ref 4.0–10.5)

## 2011-02-20 LAB — COMPREHENSIVE METABOLIC PANEL
ALT: 37 U/L — ABNORMAL HIGH (ref 0–35)
AST: 36 U/L (ref 0–37)
Albumin: 3.6 g/dL (ref 3.5–5.2)
CO2: 26 mEq/L (ref 19–32)
Chloride: 106 mEq/L (ref 96–112)
GFR calc Af Amer: >60 mL/min (ref >60)
GFR calc non Af Amer: >60 mL/min (ref >60)
Sodium: 139 mEq/L (ref 135–145)
Total Bilirubin: 0.7 mg/dL (ref 0.3–1.2)

## 2011-02-20 LAB — DIFFERENTIAL
Eosinophils Absolute: 0.1 10*3/uL (ref 0.0–0.7)
Eosinophils Relative: 1 % (ref 0–5)
Lymphs Abs: 0.5 10*3/uL — ABNORMAL LOW (ref 0.7–4.0)
Monocytes Absolute: 0.6 10*3/uL (ref 0.1–1.0)

## 2011-02-20 LAB — LIPASE, BLOOD: Lipase: 27 U/L (ref 11–59)

## 2011-02-20 LAB — PREGNANCY, URINE: Preg Test, Ur: NEGATIVE

## 2011-02-20 LAB — EHRLICHIA ANTIBODY PANEL
E chaffeensis (HGE) Ab, IgG: NEGATIVE
E chaffeensis (HGE) Ab, IgM: NEGATIVE

## 2011-02-21 LAB — PROTEIN ELECTROPH W RFLX QUANT IMMUNOGLOBULINS
Albumin ELP: 50.4 % — ABNORMAL LOW (ref 55.8–66.1)
Alpha-2-Globulin: 8.1 % (ref 7.1–11.8)
Beta Globulin: 6.8 % (ref 4.7–7.2)
Gamma Globulin: 20.1 % — ABNORMAL HIGH (ref 11.1–18.8)
M-Spike, %: NOT DETECTED g/dL
Total Protein ELP: 6.6 g/dL (ref 6.0–8.3)

## 2011-02-22 LAB — CYTOMEGALOVIRUS PCR, QUALITATIVE: Cytomegalovirus DNA: DETECTED — AB

## 2011-02-22 LAB — CULTURE, BLOOD (ROUTINE X 2)
Culture  Setup Time: 201203220928
Culture: NO GROWTH

## 2011-02-22 LAB — HCV RNA QUANT

## 2011-02-22 NOTE — Discharge Summary (Signed)
Connie Jenkins, Connie Jenkins               ACCOUNT NO.:  [id number]  MEDICAL RECORD NO.:  [id number]           PATIENT TYPE:  I  LOCATION:  5505                         FACILITY:  [facility]  PHYSICIAN:  Andreas Blower, MD       DATE OF BIRTH:  09-10-1981  DATE OF ADMISSION:  02/15/2011 DATE OF DISCHARGE:  02/20/2011                              DISCHARGE SUMMARY   PRIMARY CARE PHYSICIAN:  The patient does not have one.  INFECTIOUS DISEASES DOCTOR:  Dr. Cliffton Asters.  DISCHARGE DIAGNOSES: 1. Acute cytomegalovirus mononucleosis. 2. Fever most likely due to acute cytomegalovirus mononucleosis. 3. Cough and hemoptysis, resolved. 4. Elevated liver function tests most likely due to acute     cytomegalovirus infection.  DISCHARGE MEDICATIONS: 1. Acetaminophen 650 mg every 6 hours as needed.  The patient was     instructed not to exceed more than 4 g per day. 2. Guaifenesin/DM 100/10 mg per 5 mL every 6 hours as needed for     cough. 3. Ibuprofen 400 mg p.o. q.6 h. as needed for fever.  BRIEF ADMITTING HISTORY AND PHYSICAL:  Connie Jenkins is a 30 year old African American female with no significant past medical history, who presented on February 15, 2003 with fevers and chills.  The patient had presented to the ER multiple times and was initially diagnosed as having a questionable UTI and then was also later diagnosed with having possible upper respiratory infection and was given azithromycin.  She presented on February 15, 2011 with fever and was admitted for further workup.  RADIOLOGY/IMAGING: 1. The patient had chest x-ray two-view on February 15, 2011 which showed     small left pleural effusion. 2. The patient complete abdominal ultrasound on February 16, 2011 which     showed no abdominal pathology. 3. The patient had CT of the chest, abdomen and pelvis with contrast     on February 18, 2011, which showed small bilateral pleural effusions.     Bibasilar opacities are noted left greater than right.   Appendix is     normal.  No acute findings in abdomen and pelvis.  Fatty     infiltration of liver. 4. The patient had a 2-D echocardiogram on February 16, 2011, which     showed her cavity size was normal.  Wall thickness was normal.     Systolic function was normal.  Ejection fraction was 55-60%, wall     motion was normal.  There were no regional wall motion     abnormalities.  Ventricle diastolic parameters were normal.  LABORATORY DATA:  CBC shows a white count of 7.1, hemoglobin 12.5, hematocrit 37.1, and platelet count 221.  Electrolytes normal with a creatinine of 0.92.  Liver function tests normal except AST is 209, ALT is 334, albumin is 2.8, calcium is 8.2, LDH is 882, CK is 2553, angiotensin converting enzyme was 40, and CRP is 8.0.  ESR is 26. Hepatitis panel was negative.  HIV was nonreactive.  HIV1, RNA was less than 20.  Urine drug screen was negative.  ANA was positive, but titer was negative.  RPR is nonreactive.  CMV, IgM is positive at 1.59. Ehrlichia, IgG and IgM was negative.  EBV pattern of the past infection. [facility] spotted fever.  IgM is 0.24.  Toxoplasma antibody is 0.7. Blood cultures x2 on March 21, March 24, March 25, showed no growth to date.  Sputum culture shows findings are not representative of lower respiratory secretions.  Urine culture shows 5000 colonies/mL, insignificant growth.  UA initially on presentation was negative for nitrites and leukocytes.  HOSPITAL COURSE BY PROBLEM: 1. Fever of unknown origin.  The patient was admitted and had an     extensive workup.  Infectious Diseases was consulted.  The above     labs were obtained.  The patient has hepatitis C RNA, hepatitis B     DNA, cryoglobulin, QuantiFERON-TB Gold, SPEP is pending at this     time.  After much discussion with ID.  ID believes that her fever     is most likely due to acute CMV mononucleosis for which the     treatment and supportive care.  The patient continues to  have     fevers, as a result the patient was instructed to take     acetaminophen 650 mg every 6 hours as needed for fever and not to     exceed more than 4 g daily.  She was also instructed to take     ibuprofen 400 mg every 6 hours as needed.  She was also a given     guaifenesin/DM 5 mL every 6 hours as needed for cough. 2. Cough and hemoptysis, etiology unclear.  The patient has had above     imaging.  Hemoptysis resolved and cough is improved during the     course of hospital stay. 3. Elevated liver enzymes, I suspect may be most likely secondary to     acute CMV infection.  The patient had a right upper quadrant     ultrasound and hepatitis panel which was negative.  DISPOSITION AND FOLLOWUP:  The patient was instructed to follow up with Dr. Orvan Falconer with Infectious Diseases on March 16, 2011 at 2:45 p.m. if any openings are available sooner the patient will be called with the appointment date and time.  Time spent on discharge talking to the patient, consulting and coordinating care was 35 minutes.     Andreas Blower, MD     SR/MEDQ  D:  02/20/2011  T:  02/21/2011  Job:  [id number]  Electronically Signed by Wardell Heath Katalea Ucci  on 02/22/2011 02:17:42 PM

## 2011-02-23 NOTE — H&P (Signed)
NAMEELVENA, OYER               ACCOUNT NO.:  [id number]  MEDICAL RECORD NO.:  [id number]           PATIENT TYPE:  E  LOCATION:  [facility]                         FACILITY:  [facility]  PHYSICIAN:  Michiel Cowboy, MDDATE OF BIRTH:  1981/02/24  DATE OF ADMISSION:  02/15/2011 DATE OF DISCHARGE:                             HISTORY & PHYSICAL   ATTENDING PHYSICIAN:  Michiel Cowboy, MD  PRIMARY CARE PROVIDER:  None.  CHIEF COMPLAINT:  Fevers since Sunday with slight epigastric/right upper quadrant abdominal pain, and today some mild headache.  The patient is a 30 year old female with no significant past medical history who lives at home with her daughter.  Since Sunday last week, has been having fevers and chills.  Fever was measured up to 102-103. She presented a number of times to the emergency department, once she was diagnosed with questionable UTI, was given Macrobid and another time, she was treated for possible upper respiratory infection, although she never really had a cough or runny nose.  She is on Zithromax.  She took only one dose and then stopped because it made her feel nauseous. She denies any muscle aches.  She denies any history of tick bite.  She does have a small dog, but she rarely comes in contact with.  Because it is mainly being cared by her kids.  Her daughter, a preteen and has been taking care of her, but has been well and has not contracted this illness.  There have not been any sick contacts around her.  She has not had any rashes.  She has not traveled anywhere recently.  No nausea except when she took her medications.  No vomiting.  No diarrhea.  Very mild if any cough, practically none.  No upper respiratory symptoms. Otherwise review of systems is negative except for HPI.  PAST MEDICAL HISTORY:  None.  SOCIAL HISTORY:  The patient denies tobacco abuse, alcohol abuse, or drug abuse.  Of note, she is interviewed in the presence of  her daughter, her mother, her sister, and her nephew.  FAMILY HISTORY:  Noncontributory.  ALLERGIES:  PENICILLIN, LATEX.  MEDICATIONS:  Recently, she was given azithromycin.  She is not currently taking Macrobid.  She is not taking Tylenol as needed for pain.  PHYSICAL EXAMINATION:  VITAL SIGNS:  Temperature T-max of 102.8, blood pressure 127/87, pulse 93, respirations 18, satting 98% on room air. GENERAL:  The patient appears to be in no acute distress. HEENT:  Head is nontraumatic.  Moist mucous membranes. NECK:  Supple. SKIN:  Clean and dry without any evidence of rashes. LUNGS:  Clear to auscultation bilaterally, although the patient is not making a good effort. HEART:  Regular rate and rhythm.  No murmurs appreciated.  No rubs. ABDOMEN:  Soft.  There maybe very mild epigastric tenderness, but not significant.  Good bowel sounds present. LOWER EXTREMITIES:  Without clubbing, cyanosis, or edema, although somewhat obese. NEUROLOGIC:  Grossly intact.  LABORATORY DATA:  White blood cell count 7.4 with elevated basophils and eosinophil count, hemoglobin 13.1.  Sodium 141, potassium 3.4, creatinine 0.98, total bili 0.8, alk phos 104, AST  elevated at 235, ALT 225, total protein 6.5, albumin 3.4.  Chest x-ray did show small left pleural effusion, but she has had three chest x-rays the past few days, which is showing a significant pneumonia.  She had a UA done that did show 3-6 white blood cell and few bacteria.  The prior UA did show many bacteria, but it was a clean catch.  No evidence of significant urinary tract infection at this point.  ASSESSMENT AND PLAN:  This is a 30 year old female with prolonged history of fever for a total of about 10 days now, with transaminitis.  Fevers, transaminitis:  I wonder if she has some kind of hepatitis.  We will order hepatitis serologies.  We will order right upper quadrant ultrasound to evaluate liver and gallbladder, this is most likely  an obstructive picture also this may help [place] to find if there are any lesions, but also for completion, sedimentation rate, CRP, ANA, HIV serologies.  Given that it has been lately, we will check her and assess, and Ehrlichia serologies, I would recommend Infectious Disease consult, would order also 2D echocardiogram.  I will order blood cultures and urine culture.  For now given that she did develop some headache and has prolonged fever, would cover with doxycycline, although this may be somewhat overaggressive.  Low potassium, would replace.  Prophylaxis, Lovenox with p.o. intakes.     Michiel Cowboy, MD     AVD/MEDQ  D:  02/15/2011  T:  02/15/2011  Job:  [id number]  Electronically Signed by Therisa Doyne MD on 02/23/2011 02:39:53 AM

## 2011-02-24 LAB — CULTURE, BLOOD (ROUTINE X 2)
Culture  Setup Time: 201203242028
Culture: NO GROWTH

## 2011-02-24 LAB — HEPATITIS B DNA, ULTRAQUANTITATIVE, PCR

## 2011-02-25 LAB — CULTURE, BLOOD (ROUTINE X 2)

## 2011-03-01 DIAGNOSIS — B2710 Cytomegaloviral mononucleosis without complications: Secondary | ICD-10-CM

## 2011-03-02 LAB — URINALYSIS, MICROSCOPIC ONLY
Bilirubin Urine: NEGATIVE
Glucose, UA: NEGATIVE mg/dL
Specific Gravity, Urine: 1.024 (ref 1.005–1.030)
Urobilinogen, UA: 0.2 mg/dL (ref 0.0–1.0)
pH: 5.5 (ref 5.0–8.0)

## 2011-03-02 LAB — POCT URINALYSIS DIP (DEVICE)
Bilirubin Urine: NEGATIVE
Ketones, ur: NEGATIVE mg/dL
Protein, ur: NEGATIVE mg/dL
Specific Gravity, Urine: 1.025 (ref 1.005–1.030)
pH: 5.5 (ref 5.0–8.0)

## 2011-03-02 LAB — GC/CHLAMYDIA PROBE AMP, GENITAL
Chlamydia, DNA Probe: NEGATIVE
GC Probe Amp, Genital: NEGATIVE

## 2011-03-02 LAB — QUANTIFERON TB GOLD ASSAY (BLOOD): Interferon Gamma Release Assay: NEGATIVE

## 2011-03-02 LAB — WET PREP, GENITAL: Clue Cells Wet Prep HPF POC: NONE SEEN

## 2011-03-04 LAB — WET PREP, GENITAL
Trich, Wet Prep: NONE SEEN
Yeast Wet Prep HPF POC: NONE SEEN

## 2011-03-04 LAB — POCT URINALYSIS DIP (DEVICE)
Glucose, UA: NEGATIVE mg/dL
Hgb urine dipstick: NEGATIVE
Nitrite: NEGATIVE
Protein, ur: NEGATIVE mg/dL
Specific Gravity, Urine: 1.02 (ref 1.005–1.030)
Urobilinogen, UA: 0.2 mg/dL (ref 0.0–1.0)

## 2011-03-04 LAB — GC/CHLAMYDIA PROBE AMP, GENITAL: Chlamydia, DNA Probe: NEGATIVE

## 2011-03-06 LAB — GC/CHLAMYDIA PROBE AMP, GENITAL
Chlamydia, DNA Probe: NEGATIVE
GC Probe Amp, Genital: NEGATIVE

## 2011-03-06 LAB — CBC
HCT: 41.6 % (ref 36.0–46.0)
MCV: 85.3 fL (ref 78.0–100.0)
Platelets: 251 10*3/uL (ref 150–400)
RDW: 13.2 % (ref 11.5–15.5)

## 2011-03-06 LAB — POCT URINALYSIS DIP (DEVICE)
Glucose, UA: NEGATIVE mg/dL
Nitrite: NEGATIVE
Protein, ur: NEGATIVE mg/dL
Urobilinogen, UA: 0.2 mg/dL (ref 0.0–1.0)

## 2011-03-06 LAB — URINE CULTURE
Colony Count: NO GROWTH
Culture: NO GROWTH

## 2011-03-06 LAB — POCT PREGNANCY, URINE: Preg Test, Ur: NEGATIVE

## 2011-03-08 LAB — COMPREHENSIVE METABOLIC PANEL
AST: 33 U/L (ref 0–37)
Albumin: 3.9 g/dL (ref 3.5–5.2)
Alkaline Phosphatase: 44 U/L (ref 39–117)
BUN: 15 mg/dL (ref 6–23)
GFR calc Af Amer: >60 mL/min (ref >60)
Potassium: 3.9 mEq/L (ref 3.5–5.1)
Total Protein: 6.6 g/dL (ref 6.0–8.3)

## 2011-03-08 LAB — WET PREP, GENITAL
Clue Cells Wet Prep HPF POC: NONE SEEN
Trich, Wet Prep: NONE SEEN

## 2011-03-08 LAB — CBC
HCT: 39.7 % (ref 36.0–46.0)
Platelets: 225 10*3/uL (ref 150–400)
RDW: 13 % (ref 11.5–15.5)

## 2011-03-08 LAB — GC/CHLAMYDIA PROBE AMP, GENITAL
Chlamydia, DNA Probe: NEGATIVE
GC Probe Amp, Genital: NEGATIVE

## 2011-03-08 LAB — URINALYSIS, ROUTINE W REFLEX MICROSCOPIC
Glucose, UA: NEGATIVE mg/dL
Ketones, ur: NEGATIVE mg/dL
Nitrite: NEGATIVE
Protein, ur: NEGATIVE mg/dL
Urobilinogen, UA: 0.2 mg/dL (ref 0.0–1.0)

## 2011-03-09 LAB — POCT URINALYSIS DIP (DEVICE)
Protein, ur: 30 mg/dL — AB
Specific Gravity, Urine: >=1.03 (ref 1.005–1.030)
Urobilinogen, UA: 0.2 mg/dL (ref 0.0–1.0)

## 2011-03-09 LAB — WET PREP, GENITAL

## 2011-03-09 LAB — POCT PREGNANCY, URINE: Preg Test, Ur: NEGATIVE

## 2011-03-16 ENCOUNTER — Ambulatory Visit (INDEPENDENT_AMBULATORY_CARE_PROVIDER_SITE_OTHER): Payer: Medicaid Other | Admitting: Internal Medicine

## 2011-03-16 ENCOUNTER — Encounter: Payer: Self-pay | Admitting: Internal Medicine

## 2011-03-16 DIAGNOSIS — B2790 Infectious mononucleosis, unspecified without complication: Secondary | ICD-10-CM

## 2011-03-16 DIAGNOSIS — B2710 Cytomegaloviral mononucleosis without complications: Secondary | ICD-10-CM

## 2011-03-16 NOTE — Assessment & Plan Note (Signed)
Her fever of unknown origin is now known to be due to the acute cytomegalovirus mononucleosis. It is now clinically resolved and no further evaluation or treatment is indicated. She is unlikely to have any sequelae in the future.

## 2011-03-16 NOTE — Progress Notes (Signed)
  Subjective:    Patient ID: Connie Jenkins, female    DOB: [birth year], 30 y.o.   MRN: [id number]  HPI Ms. Ackroyd is a 30 year old female who was hospitalized last month with high fever and hepatitis. She is previously healthy. Her temperatures were spiking daily to 103 and she had hepatic transaminase levels in the 2-300 range. An extensive evaluation including blood cultures, HIV antibody, RPR, GC probe, chlamydia probe, hepatitis A, B, and C serologies, on interferon cold TBSA, [place] spotted fever and early TF antibodies were all negative. I suspect that she had mononucleosis. 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 Just after discharge her CMV IgM antibody returned positive as well as her CMV PCR from serum. She is now feeling much better and back to normal. She has not had any further fever since shortly after discharge.    Review of Systems     Objective:   Physical Exam  Constitutional: She appears well-developed and well-nourished. No distress.  HENT:  Mouth/Throat: Oropharynx is clear and moist. No oropharyngeal exudate.  Abdominal: Soft. Bowel sounds are normal. She exhibits no distension and no mass. There is no tenderness.  Psychiatric: She has a normal mood and affect.          Assessment & Plan:

## 2011-03-19 ENCOUNTER — Inpatient Hospital Stay (INDEPENDENT_AMBULATORY_CARE_PROVIDER_SITE_OTHER)
Admission: RE | Admit: 2011-03-19 | Discharge: 2011-03-19 | Disposition: A | Discharge status: Home / Self Care | Payer: Medicaid Other | Source: Ambulatory Visit | Attending: Emergency Medicine | Admitting: Emergency Medicine

## 2011-03-19 DIAGNOSIS — N760 Acute vaginitis: Secondary | ICD-10-CM

## 2011-03-19 DIAGNOSIS — N3090 Cystitis, unspecified without hematuria: Secondary | ICD-10-CM

## 2011-03-19 LAB — POCT URINALYSIS DIP (DEVICE)
Bilirubin Urine: NEGATIVE
Ketones, ur: NEGATIVE mg/dL

## 2011-03-19 LAB — WET PREP, GENITAL
Trich, Wet Prep: NONE SEEN
Yeast Wet Prep HPF POC: NONE SEEN

## 2011-03-19 LAB — POCT PREGNANCY, URINE: Preg Test, Ur: NEGATIVE

## 2011-03-20 LAB — GC/CHLAMYDIA PROBE AMP, GENITAL: Chlamydia, DNA Probe: NEGATIVE

## 2011-03-21 LAB — URINE CULTURE

## 2011-04-14 ENCOUNTER — Encounter: Payer: Self-pay | Admitting: Infectious Disease

## 2011-04-14 NOTE — H&P (Signed)
[place]. [facility]  Patient:    Connie Jenkins, Connie Jenkins                      MRN: [id number] Adm. Date:  [id number] Attending:  Willow Ora Dictator:   Patricia Pesa, M.D.                         History and Physical  ATTENDING:  Asencion Partridge, M.D.  RESIDENT:  Patricia Pesa, M.D.  CHIEF COMPLAINT:  Fever and back pain.  HISTORY OF PRESENT ILLNESS:  Connie Jenkins is a 30 year old female recently treated for pyelonephritis, who presents with one-day history of fever, chills, nausea, vomiting, abdominal pain, myalgia, back pain - left greater than right, vaginal itching, and yellow vaginal discharge.  Patient was seen in the emergency department approximately two weeks ago, was discharged with UTI versus pyelonephritis, and received antibiotics for it.  Later, she returned to the emergency department because she said she was not feeling well.  At that time, her UA was normal and she had discontinued antibiotics after four days.  She states she had been feeling well until last night.  She states her abdominal pain has been on and off for the past 10 months since undergoing a C section.  She describes the pain as being over the incision site.  She has no recent sick contacts, no unusual foods.  She does complain of seeing some blood in her urine and bright red blood per rectum.  PAST MEDICAL HISTORY:  Chronic abdominal pain, proteinuria, and genital herpes simplex.  MEDICATIONS:  Depo-Provera 150 mg every three months.  ALLERGIES:  PENICILLIN.  SOCIAL HISTORY:  Patient lives in [place] with her mom.  She attends [employer]. Denies alcohol use or drug use.  FAMILY HISTORY:  Significant for hypertension.  No diabetes, renal disease, or coronary artery disease.  REVIEW OF SYSTEMS:  No weight loss.  She does complain of palpitations but no crushing or sharp chest pain.  Currently, no shortness of breath.  She does complain of diarrhea.  Denies dysuria or polyuria.   No anemia.  PHYSICAL EXAMINATION:  VITAL SIGNS:  Temperature 100.6, blood pressure 103/62, pulse 126, respiratory rate 20, SAO2 97% on room air.  GENERAL:  Ill-appearing female in no respiratory distress.  Patient is alert and oriented x 3.  HEENT:  PERRL, EOMI.  Mucous membranes are tacky.  No pharyngeal erythema or exudate.  NECK:  No lymphadenopathy.  No thyromegaly.  CORONARY:  Rapid rate, regular rhythm.  No murmurs.  LUNGS:  Clear to auscultation bilaterally.  ABDOMEN:  Soft, mild tenderness in lower quadrant midline.  No rebound, no guarding.  Bowel sounds are normal.  No masses.  There is an 8 cm incision lower quadrant.  BACK:  Diffuse tenderness along the LS spine with CVA tenderness, left greater than right.  PELVIC:  Speculum exam reveals very little vaginal discharge for EDP. Bimanual exam revealed mild cervical motion tenderness per EDP.  RECTAL:  No external hemorrhoids visualized, was weakly heme positive.  LABORATORY DATA:  Sodium 143, potassium  3.9, chloride 110, CO2 29, BUN 21, creatinine 0.6, with a glucose of 91.  WBC 11.4, hemoglobin 13.1, hematocrit 38.7, platelets 250, 93% neutrophils, 3% lymphocytes.  Wet prep is negative. UA with specific gravity 1.023; ketones greater than 80; negative for bilirubin, protein, nitrate, or leukocyte esterase.  Urine pregnancy is negative.  Urine culture from May 31, 2000 grew  greater than 100,000 colonies of multiple species.  GC and chlamydia from June 01, 2000 were both negative.  ASSESSMENT AND PLAN:  A 30 year old with recent UTI/pyelonephritis, was partially treated, presents with one-day history of fever, chills, CVA tenderness, tachycardia, and dehydration.  1. Fever.  Likely infectious etiology, given mildly increased wbcs with left    shift.  Most likely, this is due to partially-treated pyelonephritis.    Will treat for this with Levaquin and reculture the urine.  Other    infectious etiologies to  consider include PID, appendicitis, viral    syndrome, [place] Spotted Fever. 2. Dehydration, mild.  Will give a normal saline bolus and treat with    maintenance IV fluids. 3. FEN.  Will give clear liquid diet and advance as tolerated. 4. Abdominal pain.  This is a chronic problem that may be exacerbated by    pyelonephritis.  Will follow clinically in the hospital. 5. History of proteinuria.  Stable at this time.  Will discuss further with    patients primary care physician. DD:  06/11/00 TD:  06/11/00 Job: [id number]

## 2011-04-14 NOTE — Op Note (Signed)
Connie Jenkins, Connie Jenkins               ACCOUNT NO.:  [id number]   MEDICAL RECORD NO.:  [id number]          PATIENT TYPE:  AMB   LOCATION:  [facility]                          FACILITY:  [facility]   PHYSICIAN:  Leonie Man, M.D.   DATE OF BIRTH:  July 04, 1981   DATE OF PROCEDURE:  03/14/2006  DATE OF DISCHARGE:                                 OPERATIVE REPORT   PREOPERATIVE DIAGNOSES:  Ventral hernia   POSTOPERATIVE DIAGNOSIS:  Ventral hernia.   PROCEDURE:  Repair of ventral hernia with mesh (Ventralex).   SURGEON:  Leonie Man, M.D.   ASSISTANT:  OR tech.   ANESTHESIA:  General.   SPECIMENS:  No specimens to lab.   ESTIMATED BLOOD LOSS:  Minimal.   COMPLICATIONS:  There were no complications during this operation.   DISPOSITION:  The patient returned to the PACU in excellent condition.   INDICATIONS:  The patient is a 30 year old female with a recurrent mass  extending above the umbilicus which causes pain, nausea, and occasional  vomiting.  She comes to the operating room for repair of a ventral hernia  having discussed the risks and potential benefits of surgery and having had  all questions answered and consent obtained.   PROCEDURE:  The patient was positioned supinely. Following the induction of  satisfactory general anesthesia, the abdomen is prepped with Betadine.  A  midline incision is carried down over the hernia extending from the superior  rim of the umbilicus up towards the hernia mass.  This was deepened through  the skin and subcutaneous tissue with dissection carried down to the hernia  sac.  The hernia sac was dissected free on all sides with dissection carried  down to the fascia.  The incarcerated omentum was dissected free and reduced  back into the peritoneal cavity.  The space beneath the hernia was cleared.  I use a 6.4 cm Ventralex patch and placed this into the defect and this was  then sewn to the fascial edges with interrupted 2-0 Novofil sutures.  The  repair was noted to be intact.  I then close the fascia over the mesh with a  running 2-0 Novofil suture.  The sponge, instrument, and sharp counts were  verified.  The subcutaneous tissues were then closed with interrupted 2-0  Vicryl sutures and the skin was closed with 4-0  Monocryl suture and then reinforced with Steri-Strips.  Sterile dressings  were applied, the anesthetic reversed, and the patient removed from the  operating room to the recovery room in stable condition.  She tolerated the  procedure well.      Leonie Man, M.D.  Electronically Signed     PB/MEDQ  D:  03/14/2006  T:  03/14/2006  Job:  [id number]

## 2011-04-14 NOTE — Discharge Summary (Signed)
[place]. [facility]  Patient:    Connie Jenkins, Connie Jenkins                      MRN: [id number] Adm. Date:  [id number] Disc. Date: [id number] Attending:  Asencion Partridge L Dictator:   Ilene Qua, M.D.                           Discharge Summary  DATE OF BIRTH:  22-Sep-1981  SERVICE:  Family practice teaching service.  CONSULTS:  None.  PROCEDURES:  None.  HISTORY OF PRESENT ILLNESS:  The patient is a 30 year old black female who came into the emergency room on July 16 with a one day history of fevers, chills, nausea, and vomiting along with abdominal pain, diffuse myalgias, vaginal itching, and yellow vaginal discharge.  The patient has complained of chronic abdominal pain on and off for the past 10 months.  She did not have any tick exposures or sick contacts, but she did notice some blood in her urine and bright red blood per rectum.  She also had severe back pain, right greater than left.  She was diagnosed with a urinary tract infection two weeks ago and took four days of antibiotics, and then returned to the emergency department and was told that the urinary tract infection resolved and discontinue her antibiotic.  ADMISSION PHYSICAL:  Vitals - temperature was 100.6, blood pressure 103/62, pulse 126.  She is saturating 97% on room air.  In general, she was ill-appearing, but in no respiratory distress.  HEENT - moist mucous membranes.  There was no erythema or exudate.  Cardiovascular - she had a rapid rate, but with regular rhythm.  GI - she was tender over the lower quadrant over her C-section incision, but she did not have rebound and did have positive bowel sounds.  Skin was warm and dry.  Rectal was heme positive and no evidence of external hemorrhoids.  LABS ON ADMISSION:  White count was 11.4, hemoglobin 13.1, hematocrit 38.7 with a platelet count of 250.  Differential was 93 neutrophils, 7% lymphs.  Wet prep was negative.  BMP was  normal.  Urine pregnancy was negative.  UA revealed no protein, but greater than 80 ketones.  Chest x-ray was normal.  Urine culture on May 31, 2000 revealed greater than 100,000 colonies, multi-species.  GC and Chlamydia was negative on June 01, 2000.  HOSPITAL COURSE:  #1 - PYELONEPHRITIS.  _______ as the patient had a partially treated urinary tract infection with taking only four days of antibiotics before discontinuing then, and with her CVA tenderness, and fevers and chills, it was felt that it would likely represent pyelonephritis.  She was started on Levaquin 250 mg IV a day.  GC and Chlamydia cultures were obtained to rule out PID.  On the same evening of her hospital admission, the patients temperature came down after Tylenol was given and her first dose of Levaquin, and she remained afebrile throughout the course of her hospital stay.  Her temperature on the day of discharge was 98.5.  Also, a follow up CBC revealed a white count down to 4.4 with only 60% segs and ANC was 2.7 on the day of discharge.  The urine cultures were negative x 1 day on the day of discharge, and also blood cultures drawn on the day of admission were negative x 1 day.  GC, Chlamydia, and beta Strep cultures were  still pending upon discharge.  #2 - DEHYDRATION.  The patient was given a bolus of fluid and then continued on IV maintenance therapy and she did tolerate p.o. on the evening of admission and the following morning with nausea or vomiting, and so her IV was discontinued on the day of discharge.  She was felt to be adequately rehydrated.  #3 - HYPOKALEMIA.  On the day of discharge, the patients labs revealed a potassium of 3.3 which was replaced with Micro-K, and she was told on follow up to eat high potassium foods such as bananas, etc.  #4 - CHRONIC ABDOMINAL PAIN.  This seems to have resolved on the day of discharge.  She does not complain of any abdominal pain and is not tender on exam.   This could have been secondary to a flare secondary to her pyelonephritis versus the chronic pain she has suffered after her C-section. She is not felt to have an acute abdomen and since her abdominal pain had resolved on the day discharge, no further workup was obtained.  DISCHARGE CONDITION:  Good.  DISPOSITION:  The patient is discharged to home.  DISCHARGE MEDICATIONS: 1. Levaquin 250 mg one p.o. q.d. x 8 days. 2. Tylenol as needed for pain.  ACTIVITY:  The patient can resume her same activity prior to admission.  She may return to work on Thursday, June 14, 2000.  She is given a note for this.  DIET:  She can advance to a regular diet as she tolerates.  FOLLOWUP:  She does have a follow up appointment with Asencion Partridge, M.D. at the [facility] on Friday, July 27 at 10:45 a.m. DD:  06/12/00 TD:  06/13/00 Job: [id number]
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
## 2011-04-14 NOTE — Op Note (Signed)
NAME:  Connie Jenkins, Connie Jenkins                         ACCOUNT NO.:  [id number]   MEDICAL RECORD NO.:  [id number]                   PATIENT TYPE:  INP   LOCATION:  9140                                 [facility]   PHYSICIAN:  Lesly Dukes, M.D.              DATE OF BIRTH:  [birth year]   DATE OF PROCEDURE:  09/18/2003  DATE OF DISCHARGE:                                 OPERATIVE REPORT   PREOPERATIVE DIAGNOSIS:  Para 0-1-0-1 at 11 weeks estimated gestational age  in labor for repeat low flap transverse cesarean section.   POSTOPERATIVE DIAGNOSIS:  Para 0-1-0-1 at 39 weeks estimated gestational age  in labor for repeat low flap transverse cesarean section.   PROCEDURE:  Repeat low flap transverse cesarean section.   SURGEON:  Lesly Dukes, M.D.   ASSISTANT:  Shelbie Proctor. Shawnie Pons, M.D.   ANESTHESIA:  Spinal anesthesia.   ESTIMATED BLOOD LOSS:  800 mL.   PATHOLOGY:  Placenta.   FINDINGS:  A viable female infant, Apgars 8 at one and 9 at five, delivered  by vertex OP presentation.  There was meconium stained fluid.  The lower  uterine segment was thin and had a 3 cm window.  Normal tubes and ovaries  bilaterally and normal uterus.  Pediatrics at delivery.   DESCRIPTION OF PROCEDURE:  After informed consent was obtained, the patient  was taken to the operating room with IV running.  Spinal anesthesia was  found to be adequate and the patient was placed in the dorsal supine  position and prepared in the normal sterile fashion.  Foley was placed in  the bladder.  A Pfannenstiel skin incision was made with the scalpel and  carried down to the underlying layer of fascia.  The fascia was incised in  the midline and extended bilaterally with the Mayo scissors.  The superior  and inferior aspect of the fascial incision were grasped with Kocher clamps,  tented up and dissected off sharply and bluntly from the underlying layers  of rectus muscles.  The rectus muscles were separated in the  midline and the  peritoneum was identified, tented up and entered sharply with Metzenbaum  scissors.  This incision was extended both superiorly and inferiorly with  good position of the bladder.  The bladder blade was inserted.  The  vesicouterine peritoneum was identified, tented up and entered sharply with  Metzenbaum scissors. This incision was extended bilaterally and bladder flap  was created digitally.  Bladder blade was reinserted.  At this point,  it  was noted that there was a window in the lower uterine segment.  The uterine  incision was made with the scalpel in the transverse fashion in lower  uterine segment.  This is extended bilaterally.  There was meconium stained  fluid.  The infant's head delivered without incident.  The nose and mouth  were suctioned with the DeLee suction.  The rest  of the baby's body was  delivered without incident.  The cord was doubly clamped and cut and the  baby was handed off to the awaiting pediatrician.  Cord blood was sent for  type and screen.  Cord gases were also sent.  The placenta was delivered  spontaneously with three vessel cord.  The uterus was cleared of all clots  and debris.  The uterine incision was closed with 0 Vicryl in a running  locked fashion.  Excellent hemostasis was noted.  The abdomen was copiously  irrigated and hemostasis was noted one last time.  Intercede was laced on  the uterine incision to help avoid adhesion formation.  The fascia was then  closed with 0 Vicryl in a running fashion.  Good hemostasis was noted from  these layers as well.  The subcutaneous tissue was copiously irrigated and  the skin was closed with staples.  Dressing was placed in the skin and the  patient went to the recovery room with Foley in place.                                               Lesly Dukes, M.D.    Lora Paula  D:  09/18/2003  T:  09/18/2003  Job:  [id number]

## 2011-05-16 ENCOUNTER — Ambulatory Visit (HOSPITAL_COMMUNITY): Admission: RE | Admit: 2011-05-16 | Payer: Medicaid Other | Source: Ambulatory Visit | Admitting: Oral Surgery

## 2011-06-11 ENCOUNTER — Inpatient Hospital Stay (INDEPENDENT_AMBULATORY_CARE_PROVIDER_SITE_OTHER)
Admission: RE | Admit: 2011-06-11 | Discharge: 2011-06-11 | Disposition: A | Discharge status: Home / Self Care | Payer: Medicaid Other | Source: Ambulatory Visit | Attending: Family Medicine | Admitting: Family Medicine

## 2011-06-11 DIAGNOSIS — N390 Urinary tract infection, site not specified: Secondary | ICD-10-CM

## 2011-06-11 LAB — POCT URINALYSIS DIP (DEVICE)
Glucose, UA: NEGATIVE mg/dL
Nitrite: NEGATIVE
Urobilinogen, UA: 0.2 mg/dL (ref 0.0–1.0)

## 2011-06-11 LAB — WET PREP, GENITAL

## 2011-06-11 LAB — POCT PREGNANCY, URINE: Preg Test, Ur: NEGATIVE

## 2011-06-12 LAB — URINE CULTURE: Colony Count: 75000

## 2011-06-13 LAB — GC/CHLAMYDIA PROBE AMP, GENITAL: Chlamydia, DNA Probe: NEGATIVE

## 2011-07-21 ENCOUNTER — Inpatient Hospital Stay (HOSPITAL_COMMUNITY)
Admission: AD | Admit: 2011-07-21 | Discharge: 2011-07-21 | Disposition: A | Discharge status: Home / Self Care | Payer: Medicaid Other | Source: Ambulatory Visit | Attending: Obstetrics | Admitting: Obstetrics

## 2011-07-21 ENCOUNTER — Encounter (HOSPITAL_COMMUNITY): Payer: Self-pay | Admitting: Obstetrics and Gynecology

## 2011-07-21 DIAGNOSIS — R109 Unspecified abdominal pain: Secondary | ICD-10-CM | POA: Diagnosis present

## 2011-07-21 DIAGNOSIS — R1110 Vomiting, unspecified: Secondary | ICD-10-CM

## 2011-07-21 DIAGNOSIS — A499 Bacterial infection, unspecified: Secondary | ICD-10-CM

## 2011-07-21 DIAGNOSIS — R1032 Left lower quadrant pain: Secondary | ICD-10-CM | POA: Insufficient documentation

## 2011-07-21 DIAGNOSIS — N760 Acute vaginitis: Secondary | ICD-10-CM | POA: Insufficient documentation

## 2011-07-21 DIAGNOSIS — O2390 Unspecified genitourinary tract infection in pregnancy, unspecified trimester: Secondary | ICD-10-CM | POA: Insufficient documentation

## 2011-07-21 DIAGNOSIS — B9689 Other specified bacterial agents as the cause of diseases classified elsewhere: Secondary | ICD-10-CM

## 2011-07-21 HISTORY — DX: Fever, unspecified: R50.9

## 2011-07-21 LAB — GC/CHLAMYDIA PROBE AMP, GENITAL: Chlamydia, DNA Probe: NEGATIVE

## 2011-07-21 LAB — URINE MICROSCOPIC-ADD ON

## 2011-07-21 LAB — URINALYSIS, ROUTINE W REFLEX MICROSCOPIC
Bilirubin Urine: NEGATIVE
Ketones, ur: NEGATIVE mg/dL
Nitrite: NEGATIVE
pH: 6 (ref 5.0–8.0)

## 2011-07-21 LAB — WET PREP, GENITAL
Trich, Wet Prep: NONE SEEN
Yeast Wet Prep HPF POC: NONE SEEN

## 2011-07-21 LAB — CBC
MCHC: 32 g/dL (ref 30.0–36.0)
Platelets: 259 10*3/uL (ref 150–400)
RDW: 14 % (ref 11.5–15.5)

## 2011-07-21 MED ORDER — CLINDAMYCIN HCL 300 MG PO CAPS: 300.0000 mg | ORAL_CAPSULE | Freq: Two times a day (BID) | ORAL | Status: AC

## 2011-07-21 NOTE — Progress Notes (Signed)
"  I've been vomiting for about 2 weeks.  I'm having LLQ pain for a month.  I went to Urgent Care and they said I had a UTI, but it hasn't gone away."

## 2011-07-21 NOTE — ED Provider Notes (Signed)
History     Chief Complaint  Patient presents with  . Abdominal Pain   HPI Pt c/o LLQ pain x 1 month, nausea and vomiting x  2 weeks, "every time I eat". + vaginal discharge, but no itching or pain. States that abdominal pain improves with ibuprofen, but didn't want to keep taking it for fear that it would mask the pain getting worse. No vaginal bleeding, dysuria, fever, chills, constipation or diarrhea. LMP in June. Was treated for UTI recently by Urgent Care, but symptoms never improved.   OB History    Grav Para Term Preterm Abortions TAB SAB Ect Mult Living   3 2 1 1 1  1   2       Past Medical History  Diagnosis Date  . Febrile illness, acute 01/2011    Hospitalized for 6 days    Past Surgical History  Procedure Date  . Cesarean section 2000 & 2004    "1st one I dilated 1cm, then the 2nd one was a rpt    No family history on file.  History  Substance Use Topics  . Smoking status: Never Smoker   . Smokeless tobacco: Never Used  . Alcohol Use: No    Allergies:  Allergies  Allergen Reactions  . Flagyl (Metronidazole) Nausea And Vomiting  . Latex Itching  . Penicillins Other (See Comments)    Childhood allergy; reaction unknown    No prescriptions prior to admission    Review of Systems  Constitutional: Negative.   Respiratory: Negative.   Cardiovascular: Negative.   Gastrointestinal: Positive for nausea, vomiting and abdominal pain. Negative for diarrhea and constipation.  Genitourinary: Negative for dysuria, urgency, frequency, hematuria and flank pain.       Negative for vaginal bleeding, vaginal discharge, dyspareunia  Musculoskeletal: Negative.   Neurological: Negative.   Psychiatric/Behavioral: Negative.    Physical Exam   Blood pressure 105/70, pulse 80, temperature 99.3 F (37.4 C), temperature source Oral, resp. rate 18, height 5\' 3"  (1.6 m), weight 82.736 kg (182 lb 6.4 oz), last menstrual period 05/01/2011.  Physical Exam  Constitutional:  She is oriented to person, place, and time. She appears well-developed and well-nourished. No distress.  HENT:  Head: Normocephalic and atraumatic.  Cardiovascular: Normal rate, regular rhythm and normal heart sounds.   Respiratory: Effort normal and breath sounds normal. No respiratory distress.  GI: Soft. Bowel sounds are normal. She exhibits no distension and no mass. There is no tenderness. There is no rebound and no guarding.  Genitourinary: There is no rash or lesion on the right labia. There is no rash or lesion on the left labia. Uterus is not deviated, not enlarged, not fixed and not tender. Cervix exhibits no motion tenderness, no discharge and no friability. Right adnexum displays no mass, no tenderness and no fullness. Left adnexum displays no mass, no tenderness and no fullness. No erythema, tenderness or bleeding around the vagina. No vaginal discharge found.  Neurological: She is alert and oriented to person, place, and time.  Skin: Skin is warm and dry.  Psychiatric: She has a normal mood and affect.    MAU Course  Procedures Results for orders placed during the hospital encounter of 07/21/11 (from the past 24 hour(s))  POCT PREGNANCY, URINE     Status: Normal   Collection Time   07/21/11  1:59 AM      Component Value Range   Preg Test, Ur NEGATIVE    URINALYSIS, ROUTINE W REFLEX MICROSCOPIC  Status: Abnormal   Collection Time   07/21/11  2:00 AM      Component Value Range   Color, Urine YELLOW  YELLOW    Appearance CLEAR  CLEAR    Specific Gravity, Urine >1.030 (*) 1.005 - 1.030    pH 6.0  5.0 - 8.0    Glucose, UA NEGATIVE  NEGATIVE (mg/dL)   Hgb urine dipstick TRACE (*) NEGATIVE    Bilirubin Urine NEGATIVE  NEGATIVE    Ketones, ur NEGATIVE  NEGATIVE (mg/dL)   Protein, ur NEGATIVE  NEGATIVE (mg/dL)   Urobilinogen, UA 0.2  0.0 - 1.0 (mg/dL)   Nitrite NEGATIVE  NEGATIVE    Leukocytes, UA NEGATIVE  NEGATIVE   URINE MICROSCOPIC-ADD ON     Status: Abnormal    Collection Time   07/21/11  2:00 AM      Component Value Range   Squamous Epithelial / LPF RARE  RARE    WBC, UA 3-6  <3 (WBC/hpf)   RBC / HPF 7-10  <3 (RBC/hpf)   Bacteria, UA FEW (*) RARE   WET PREP, GENITAL     Status: Abnormal   Collection Time   07/21/11  2:32 AM      Component Value Range   Yeast, Wet Prep NONE SEEN  NONE SEEN    Trich, Wet Prep NONE SEEN  NONE SEEN    Clue Cells, Wet Prep MODERATE (*) NONE SEEN    WBC, Wet Prep HPF POC FEW (*) NONE SEEN      Offered Ibuprofen in MAU, pt declines, states, "I have some at home if I need it, I don't really need anything now".   Assessment and Plan  30 y.o. [id number] BV - treat with Clindamycin  Vomiting and abd pain - If symptoms do not improve, pt states she will call her prior GI doctor  Connie Jenkins 07/21/2011, 2:50 AM

## 2011-07-27 ENCOUNTER — Other Ambulatory Visit (HOSPITAL_COMMUNITY): Payer: Self-pay | Admitting: Obstetrics

## 2011-07-27 DIAGNOSIS — R1032 Left lower quadrant pain: Secondary | ICD-10-CM

## 2011-08-02 ENCOUNTER — Ambulatory Visit (HOSPITAL_COMMUNITY)
Admission: RE | Admit: 2011-08-02 | Discharge: 2011-08-02 | Payer: Medicaid Other | Source: Ambulatory Visit | Attending: Obstetrics | Admitting: Obstetrics

## 2011-08-18 LAB — GC/CHLAMYDIA PROBE AMP, GENITAL: Chlamydia, DNA Probe: NEGATIVE

## 2011-08-18 LAB — WET PREP, GENITAL: WBC, Wet Prep HPF POC: NONE SEEN

## 2011-08-18 LAB — POCT URINALYSIS DIP (DEVICE)
Bilirubin Urine: NEGATIVE
Ketones, ur: NEGATIVE
Specific Gravity, Urine: 1.02
pH: 7

## 2011-08-18 LAB — POCT PREGNANCY, URINE
Operator id: 235561
Preg Test, Ur: NEGATIVE

## 2011-08-21 LAB — URINALYSIS, ROUTINE W REFLEX MICROSCOPIC
Bilirubin Urine: NEGATIVE
Glucose, UA: NEGATIVE
Nitrite: POSITIVE — AB
Specific Gravity, Urine: >1.03 — ABNORMAL HIGH
pH: 6

## 2011-08-21 LAB — WET PREP, GENITAL
Clue Cells Wet Prep HPF POC: NONE SEEN
Trich, Wet Prep: NONE SEEN

## 2011-08-21 LAB — URINE MICROSCOPIC-ADD ON

## 2011-08-22 LAB — URINALYSIS, ROUTINE W REFLEX MICROSCOPIC
Bilirubin Urine: NEGATIVE
Glucose, UA: NEGATIVE
Hgb urine dipstick: NEGATIVE
Ketones, ur: NEGATIVE
Nitrite: NEGATIVE
Protein, ur: NEGATIVE
Specific Gravity, Urine: 1.027
Urobilinogen, UA: 1
pH: 7

## 2011-08-22 LAB — POCT PREGNANCY, URINE
Operator id: 257131
Preg Test, Ur: NEGATIVE

## 2011-08-22 LAB — WET PREP, GENITAL
Trich, Wet Prep: NEGATIVE — AB
Yeast Wet Prep HPF POC: NEGATIVE — AB

## 2011-08-22 LAB — URINE MICROSCOPIC-ADD ON

## 2011-08-22 LAB — GC/CHLAMYDIA PROBE AMP, GENITAL
Chlamydia, DNA Probe: NEGATIVE
GC Probe Amp, Genital: NEGATIVE

## 2011-08-22 LAB — RPR: RPR Ser Ql: NONREACTIVE

## 2011-08-24 LAB — URINE CULTURE: Colony Count: 60000

## 2011-08-24 LAB — POCT URINALYSIS DIP (DEVICE): pH: >=9

## 2011-08-24 LAB — GC/CHLAMYDIA PROBE AMP, GENITAL: Chlamydia, DNA Probe: NEGATIVE

## 2011-08-24 LAB — WET PREP, GENITAL: WBC, Wet Prep HPF POC: NONE SEEN

## 2011-08-24 LAB — POCT PREGNANCY, URINE: Operator id: 239701

## 2011-08-28 LAB — URINE MICROSCOPIC-ADD ON

## 2011-08-28 LAB — URINALYSIS, ROUTINE W REFLEX MICROSCOPIC
Glucose, UA: NEGATIVE
Hgb urine dipstick: NEGATIVE
Leukocytes, UA: NEGATIVE
Nitrite: NEGATIVE
Protein, ur: NEGATIVE
Specific Gravity, Urine: 1.03
Specific Gravity, Urine: >1.03 — ABNORMAL HIGH
Urobilinogen, UA: 1
pH: 6

## 2011-08-28 LAB — GC/CHLAMYDIA PROBE AMP, GENITAL
Chlamydia, DNA Probe: NEGATIVE
GC Probe Amp, Genital: NEGATIVE

## 2011-08-28 LAB — POCT PREGNANCY, URINE: Preg Test, Ur: NEGATIVE

## 2011-08-28 LAB — WET PREP, GENITAL: Trich, Wet Prep: NONE SEEN

## 2011-08-29 LAB — URINALYSIS, ROUTINE W REFLEX MICROSCOPIC
Protein, ur: 30 — AB
Urobilinogen, UA: 1

## 2011-08-29 LAB — URINE MICROSCOPIC-ADD ON

## 2011-08-29 LAB — WET PREP, GENITAL: Yeast Wet Prep HPF POC: NONE SEEN

## 2011-08-29 LAB — POCT PREGNANCY, URINE: Preg Test, Ur: NEGATIVE

## 2011-08-29 LAB — URINE CULTURE: Colony Count: NO GROWTH

## 2011-09-05 LAB — POCT URINALYSIS DIP (DEVICE)
Glucose, UA: NEGATIVE
Ketones, ur: NEGATIVE
Operator id: 247071
Specific Gravity, Urine: 1.02

## 2011-09-05 LAB — WET PREP, GENITAL
Clue Cells Wet Prep HPF POC: NONE SEEN
Trich, Wet Prep: NONE SEEN

## 2011-09-05 LAB — POCT PREGNANCY, URINE
Operator id: 247071
Preg Test, Ur: NEGATIVE

## 2011-09-07 LAB — POCT URINALYSIS DIP (DEVICE)
Ketones, ur: NEGATIVE
Protein, ur: NEGATIVE
Specific Gravity, Urine: 1.025
pH: 6.5

## 2011-09-07 LAB — POCT PREGNANCY, URINE: Preg Test, Ur: NEGATIVE

## 2011-09-11 LAB — URINE MICROSCOPIC-ADD ON

## 2011-09-11 LAB — URINALYSIS, ROUTINE W REFLEX MICROSCOPIC
Glucose, UA: NEGATIVE
Hgb urine dipstick: NEGATIVE
Specific Gravity, Urine: >1.03 — ABNORMAL HIGH
Urobilinogen, UA: 1

## 2011-09-11 LAB — POCT PREGNANCY, URINE: Operator id: 263291

## 2011-09-13 LAB — WET PREP, GENITAL: Trich, Wet Prep: NONE SEEN

## 2011-09-13 LAB — HERPES SIMPLEX VIRUS CULTURE: Culture: DETECTED

## 2011-09-13 LAB — POCT URINALYSIS DIP (DEVICE)
Bilirubin Urine: NEGATIVE
Glucose, UA: NEGATIVE
Ketones, ur: NEGATIVE
Operator id: 200941
Protein, ur: 100 — AB
Specific Gravity, Urine: >=1.03

## 2011-09-13 LAB — POCT PREGNANCY, URINE: Operator id: 200941

## 2011-09-18 ENCOUNTER — Emergency Department (HOSPITAL_COMMUNITY)
Admission: EM | Admit: 2011-09-18 | Discharge: 2011-09-18 | Discharge status: Against Medical Advice | Payer: Medicaid Other | Attending: Emergency Medicine | Admitting: Emergency Medicine

## 2011-12-13 ENCOUNTER — Encounter (HOSPITAL_COMMUNITY): Payer: Self-pay | Admitting: *Deleted

## 2011-12-13 ENCOUNTER — Inpatient Hospital Stay (HOSPITAL_COMMUNITY)
Admission: AD | Admit: 2011-12-13 | Discharge: 2011-12-13 | Disposition: A | Discharge status: Home / Self Care | Payer: Medicaid Other | Source: Ambulatory Visit | Attending: Obstetrics | Admitting: Obstetrics

## 2011-12-13 DIAGNOSIS — R109 Unspecified abdominal pain: Secondary | ICD-10-CM | POA: Insufficient documentation

## 2011-12-13 DIAGNOSIS — M545 Low back pain, unspecified: Secondary | ICD-10-CM | POA: Insufficient documentation

## 2011-12-13 DIAGNOSIS — M549 Dorsalgia, unspecified: Secondary | ICD-10-CM

## 2011-12-13 LAB — WET PREP, GENITAL
Clue Cells Wet Prep HPF POC: NONE SEEN
Trich, Wet Prep: NONE SEEN
Yeast Wet Prep HPF POC: NONE SEEN

## 2011-12-13 LAB — URINALYSIS, ROUTINE W REFLEX MICROSCOPIC
Ketones, ur: NEGATIVE mg/dL
Nitrite: NEGATIVE
Protein, ur: NEGATIVE mg/dL

## 2011-12-13 LAB — POCT PREGNANCY, URINE: Preg Test, Ur: NEGATIVE

## 2011-12-13 NOTE — Progress Notes (Signed)
Pt states she does not know what color.

## 2011-12-13 NOTE — Progress Notes (Signed)
SSE per CNM.  Wet prep and cultures collected.  VE done.  

## 2011-12-13 NOTE — Progress Notes (Signed)
Pt presents to [facility] withc/o back pain that started a week ago.  States she is having some cramping as well.  States it is worse on left side.

## 2011-12-13 NOTE — Progress Notes (Signed)
N. Frazier, CNM at bedside.  Assessment done and poc discussed with pt.  

## 2011-12-13 NOTE — ED Provider Notes (Signed)
History     Chief Complaint  Patient presents with  . Back Pain   HPI 31 y.o. [id number] with low back pain , worse on left side, x 1 week. Has just started school and has been carrying a heavy bag. Hurts with bending and lifting. Also c/o some low abd pain and vaginal discharge, "runny", + odor.    Past Medical History  Diagnosis Date  . Febrile illness, acute 01/2011    Hospitalized for 6 days    Past Surgical History  Procedure Date  . Cesarean section 2000 & 2004    "1st one I dilated 1cm, then the 2nd one was a rpt  . Hernia repair     History reviewed. No pertinent family history.  History  Substance Use Topics  . Smoking status: Never Smoker   . Smokeless tobacco: Never Used  . Alcohol Use: No    Allergies:  Allergies  Allergen Reactions  . Flagyl (Metronidazole) Nausea And Vomiting  . Latex Itching  . Penicillins Other (See Comments)    Childhood allergy; reaction unknown    No prescriptions prior to admission    Review of Systems  Constitutional: Negative.   Respiratory: Negative.   Cardiovascular: Negative.   Gastrointestinal: Negative for nausea, vomiting, abdominal pain, diarrhea and constipation.  Genitourinary: Negative for dysuria, urgency, frequency, hematuria and flank pain.       Positive for vaginal discharge  Musculoskeletal: Positive for back pain.  Neurological: Negative.   Psychiatric/Behavioral: Negative.    Physical Exam   Blood pressure 122/72, pulse 67, resp. rate 18, height 5\' 3"  (1.6 m), weight 201 lb (91.173 kg), last menstrual period 12/13/2011.  Physical Exam  Nursing note and vitals reviewed. Constitutional: She is oriented to person, place, and time. She appears well-developed and well-nourished. No distress.       Appears in no distress at all, laughing/talking/ambulating with no difficulty   HENT:  Head: Normocephalic and atraumatic.  Cardiovascular: Normal rate, regular rhythm and normal heart sounds.   Respiratory:  Effort normal and breath sounds normal. No respiratory distress.  GI: Soft. Bowel sounds are normal. She exhibits no distension and no mass. There is no tenderness. There is no rebound, no guarding and no CVA tenderness.  Genitourinary: There is no rash or lesion on the right labia. There is no rash or lesion on the left labia. Uterus is not deviated, not enlarged, not fixed and not tender. Cervix exhibits no motion tenderness, no discharge and no friability. Right adnexum displays no mass, no tenderness and no fullness. Left adnexum displays no mass, no tenderness and no fullness. No erythema, tenderness or bleeding around the vagina. Vaginal discharge (thin, white) found.  Musculoskeletal: Normal range of motion.       Mild left lumbar tenderness and tightness  Neurological: She is alert and oriented to person, place, and time.  Skin: Skin is warm and dry.  Psychiatric: She has a normal mood and affect.    MAU Course  Procedures  Results for orders placed during the hospital encounter of 12/13/11 (from the past 24 hour(s))  WET PREP, GENITAL     Status: Abnormal   Collection Time   12/13/11  3:20 AM      Component Value Range   Yeast, Wet Prep NONE SEEN  NONE SEEN    Trich, Wet Prep NONE SEEN  NONE SEEN    Clue Cells, Wet Prep NONE SEEN  NONE SEEN    WBC, Wet Prep HPF POC MODERATE (*)  NONE SEEN   URINALYSIS, ROUTINE W REFLEX MICROSCOPIC     Status: Abnormal   Collection Time   12/13/11  3:20 AM      Component Value Range   Color, Urine YELLOW  YELLOW    APPearance CLEAR  CLEAR    Specific Gravity, Urine >1.030 (*) 1.005 - 1.030    pH 5.5  5.0 - 8.0    Glucose, UA NEGATIVE  NEGATIVE (mg/dL)   Hgb urine dipstick TRACE (*) NEGATIVE    Bilirubin Urine NEGATIVE  NEGATIVE    Ketones, ur NEGATIVE  NEGATIVE (mg/dL)   Protein, ur NEGATIVE  NEGATIVE (mg/dL)   Urobilinogen, UA 0.2  0.0 - 1.0 (mg/dL)   Nitrite NEGATIVE  NEGATIVE    Leukocytes, UA TRACE (*) NEGATIVE   URINE MICROSCOPIC-ADD  ON     Status: Normal   Collection Time   12/13/11  3:20 AM      Component Value Range   Squamous Epithelial / LPF RARE  RARE    WBC, UA 0-2  <3 (WBC/hpf)   RBC / HPF 0-2  <3 (RBC/hpf)   Bacteria, UA RARE  RARE      Assessment and Plan  31 y.o. [id number] with back pain, likely muscle strain Ibuprofen, ice, heat Exercises rev'd F/U PRN  FRAZIER,NATALIE 12/13/2011, 3:53 AM

## 2012-01-12 ENCOUNTER — Inpatient Hospital Stay (HOSPITAL_COMMUNITY)
Admission: AD | Admit: 2012-01-12 | Discharge: 2012-01-12 | Disposition: A | Discharge status: Home / Self Care | Payer: Medicaid Other | Source: Ambulatory Visit | Attending: Obstetrics | Admitting: Obstetrics

## 2012-01-12 ENCOUNTER — Encounter (HOSPITAL_COMMUNITY): Payer: Self-pay | Admitting: Family

## 2012-01-12 DIAGNOSIS — N72 Inflammatory disease of cervix uteri: Secondary | ICD-10-CM

## 2012-01-12 DIAGNOSIS — M549 Dorsalgia, unspecified: Secondary | ICD-10-CM | POA: Insufficient documentation

## 2012-01-12 DIAGNOSIS — B3731 Acute candidiasis of vulva and vagina: Secondary | ICD-10-CM | POA: Insufficient documentation

## 2012-01-12 DIAGNOSIS — N390 Urinary tract infection, site not specified: Secondary | ICD-10-CM | POA: Insufficient documentation

## 2012-01-12 DIAGNOSIS — R109 Unspecified abdominal pain: Secondary | ICD-10-CM | POA: Insufficient documentation

## 2012-01-12 DIAGNOSIS — B373 Candidiasis of vulva and vagina: Secondary | ICD-10-CM | POA: Insufficient documentation

## 2012-01-12 HISTORY — DX: Herpesviral infection, unspecified: B00.9

## 2012-01-12 LAB — URINALYSIS, ROUTINE W REFLEX MICROSCOPIC
Ketones, ur: NEGATIVE mg/dL
Nitrite: NEGATIVE
Specific Gravity, Urine: 1.025 (ref 1.005–1.030)
Urobilinogen, UA: 0.2 mg/dL (ref 0.0–1.0)
pH: 6.5 (ref 5.0–8.0)

## 2012-01-12 LAB — URINE MICROSCOPIC-ADD ON

## 2012-01-12 LAB — WET PREP, GENITAL
Clue Cells Wet Prep HPF POC: NONE SEEN
Trich, Wet Prep: NONE SEEN

## 2012-01-12 MED ORDER — SULFAMETHOXAZOLE-TRIMETHOPRIM 800-160 MG PO TABS: 1.0000 | ORAL_TABLET | Freq: Two times a day (BID) | ORAL | Status: AC

## 2012-01-12 MED ORDER — CEFTRIAXONE SODIUM 250 MG IJ SOLR
250.0000 mg | Freq: Once | INTRAMUSCULAR | Status: AC
  Administered 2012-01-12: 250 mg via INTRAMUSCULAR
  Filled 2012-01-12: qty 250

## 2012-01-12 MED ORDER — AZITHROMYCIN 250 MG PO TABS
1000.0000 mg | ORAL_TABLET | Freq: Once | ORAL | Status: AC
  Administered 2012-01-12: 1000 mg via ORAL
  Filled 2012-01-12: qty 4

## 2012-01-12 NOTE — Progress Notes (Signed)
"  I'm having sharp, deep pains in my lower abd.  My d/c vaginal d/c is thick, runny and itchy.  I treated myself with Monostat about a week ago, because I thought it was a yeast infection.  I've been hurting for about a month now.  I was nauseated and had loose stools last week, but nothing this week.  I called Dr. Elsie Stain office this Monday and they won't be able to see me until March.  No new sexual partners, but not sure if partner does (been with this partner about 6 months)."

## 2012-01-12 NOTE — Discharge Instructions (Signed)
Cervicitis Cervicitis is a soreness and swelling (inflammation) of the cervix. Your cervix is located at the bottom of your uterus which opens up to the vagina.  CAUSES   Sexually transmitted infections (STIs).   Allergic reaction.   Medicines or birth control devices that are put in the vagina.   Injury to the cervix.   Bacterial infections.  SYMPTOMS  There may be no symptoms. If symptoms occur, they may include:  Grey, white, yellow, or bad smelling vaginal discharge.   Pain or itching of the area outside the vagina.   Painful sexual intercourse.   Lower abdominal or lower back pain, especially during intercourse.   Frequent urination.   Abnormal vaginal bleeding between periods, after sexual intercourse, or after menopause.   Pressure or a heavy feeling in the pelvis.  DIAGNOSIS  Diagnosis is made after a pelvic exam. Other tests may include:  Examination of any discharge under a microscope (wet prep).   A Pap test.  TREATMENT  Treatment will depend on the cause of cervicitis. If it is caused by an STI, both you and your partner will need to be treated. Antibiotic medicines will be given. HOME CARE INSTRUCTIONS   Do not have sexual intercourse until your caregiver says it is okay.   Do not have sexual intercourse until your partner has been treated if your cervicitis is caused by an STI.   Take your antibiotics as directed. Finish them even if you start to feel better.  SEEK IMMEDIATE MEDICAL CARE IF:   Your symptoms come back.   You have a fever.   You experience any problems that may be related to the medicine you are taking.  MAKE SURE YOU:   Understand these instructions.   Will watch your condition.   Will get help right away if you are not doing well or get worse.  Document Released: 11/13/2005 Document Revised: 07/26/2011 Document Reviewed: 06/12/2011 [employer] Patient Information 2012 [employer]. 

## 2012-01-12 NOTE — ED Provider Notes (Signed)
History     Chief Complaint  Patient presents with  . Abdominal Pain  . Back Pain  . Vaginal Discharge   HPI  Pt states, " I've had vaginal discharge with itching for 2 1/2 wks. Denies UTI symptoms.  I treated it with Monistat for 7 days, but it didn't help. I've had pain in my low abdomen and low back for about a month."  Does not recall LMP do to recently stopping Depo Provera.   Past Medical History  Diagnosis Date  . Febrile illness, acute 01/2011    Hospitalized for 6 days    Past Surgical History  Procedure Date  . Cesarean section 2000 & 2004    "1st one I dilated 1cm, then the 2nd one was a rpt  . Hernia repair     No family history on file.  History  Substance Use Topics  . Smoking status: Never Smoker   . Smokeless tobacco: Never Used  . Alcohol Use: No    Allergies:  Allergies  Allergen Reactions  . Flagyl (Metronidazole) Nausea And Vomiting  . Latex Itching  . Penicillins Other (See Comments)    Childhood allergy; reaction unknown    Prescriptions prior to admission  Medication Sig Dispense Refill  . DiphenhydrAMINE HCl (BENADRYL ALLERGY PO) Take 2 capsules by mouth daily as needed. Patient took this medication because of itching that occurred from eating fish.      Marland Kitchen OVER THE COUNTER MEDICATION Take 5 mLs by mouth daily as needed. Patient used this medication for a cold.  The medication was called Nite Time.      . valACYclovir (VALTREX) 500 MG tablet Take 500 mg by mouth 2 (two) times daily.        Review of Systems  Gastrointestinal: Positive for abdominal pain.  Genitourinary:       +vaginal itching and discharge  Musculoskeletal: Positive for back pain.   Physical Exam   Blood pressure 109/71, pulse 76, temperature 99.4 F (37.4 C), temperature source Oral, resp. rate 16, height 5' 2.75" (1.594 m), weight 86.24 kg (190 lb 2 oz).  Physical Exam  Constitutional: She is oriented to person, place, and time. She appears well-developed and  well-nourished.  HENT:  Head: Normocephalic.  Neck: Normal range of motion. Neck supple.  Cardiovascular: Normal rate and regular rhythm.   Respiratory: Effort normal and breath sounds normal.  GI: Soft. She exhibits no mass. There is no tenderness. There is no guarding.  Genitourinary: Cervix exhibits no motion tenderness. Vaginal discharge (copius yellowish discharge) found.       +fishy odor  Neurological: She is alert and oriented to person, place, and time.  Skin: Skin is warm and dry.    MAU Course  Procedures  Wet prep GC/CT Rocephin IM Zithromax PO  Results for orders placed during the hospital encounter of 01/12/12 (from the past 24 hour(s))  URINALYSIS, ROUTINE W REFLEX MICROSCOPIC     Status: Abnormal   Collection Time   01/12/12  5:55 PM      Component Value Range   Color, Urine YELLOW  YELLOW    APPearance HAZY (*) CLEAR    Specific Gravity, Urine 1.025  1.005 - 1.030    pH 6.5  5.0 - 8.0    Glucose, UA NEGATIVE  NEGATIVE (mg/dL)   Hgb urine dipstick SMALL (*) NEGATIVE    Bilirubin Urine NEGATIVE  NEGATIVE    Ketones, ur NEGATIVE  NEGATIVE (mg/dL)   Protein, ur NEGATIVE  NEGATIVE (mg/dL)   Urobilinogen, UA 0.2  0.0 - 1.0 (mg/dL)   Nitrite NEGATIVE  NEGATIVE    Leukocytes, UA LARGE (*) NEGATIVE   URINE MICROSCOPIC-ADD ON     Status: Abnormal   Collection Time   01/12/12  5:55 PM      Component Value Range   Squamous Epithelial / LPF MANY (*) RARE    WBC, UA 11-20  <3 (WBC/hpf)   RBC / HPF 0-2  <3 (RBC/hpf)   Bacteria, UA MANY (*) RARE   POCT PREGNANCY, URINE     Status: Normal   Collection Time   01/12/12  5:57 PM      Component Value Range   Preg Test, Ur NEGATIVE  NEGATIVE   WET PREP, GENITAL     Status: Abnormal   Collection Time   01/12/12  7:59 PM      Component Value Range   Yeast Wet Prep HPF POC FEW (*) NONE SEEN    Trich, Wet Prep NONE SEEN  NONE SEEN    Clue Cells Wet Prep HPF POC NONE SEEN  NONE SEEN    WBC, Wet Prep HPF POC TOO NUMEROUS TO  COUNT (*) NONE SEEN     Assessment and Plan  Cervicitis UTI Yeast Infection  Plan: Bactrim DS OTC Yeast Infection cream Rocephin IM Zithromax PO    MUHAMMAD,Ottis Sarnowski 01/12/2012, 7:42 PM

## 2012-01-12 NOTE — Progress Notes (Signed)
Pt states, " I've had vaginal discharge with itching for 2 1/2 wks. I treated it with Monistat for 7 days, but it didn't help. I've had pain in my low abdomen and low back for about a month."

## 2012-01-13 LAB — GC/CHLAMYDIA PROBE AMP, GENITAL: Chlamydia, DNA Probe: NEGATIVE

## 2012-04-01 ENCOUNTER — Encounter (HOSPITAL_COMMUNITY): Payer: Self-pay

## 2012-04-01 ENCOUNTER — Emergency Department (HOSPITAL_COMMUNITY)
Admission: EM | Admit: 2012-04-01 | Discharge: 2012-04-01 | Disposition: A | Discharge status: Home / Self Care | Payer: Medicaid Other | Source: Home / Self Care

## 2012-04-01 DIAGNOSIS — N949 Unspecified condition associated with female genital organs and menstrual cycle: Secondary | ICD-10-CM

## 2012-04-01 DIAGNOSIS — N9489 Other specified conditions associated with female genital organs and menstrual cycle: Secondary | ICD-10-CM

## 2012-04-01 LAB — POCT URINALYSIS DIP (DEVICE)
Glucose, UA: NEGATIVE mg/dL
Leukocytes, UA: NEGATIVE
Specific Gravity, Urine: 1.025 (ref 1.005–1.030)
Urobilinogen, UA: 0.2 mg/dL (ref 0.0–1.0)

## 2012-04-01 LAB — WET PREP, GENITAL: Yeast Wet Prep HPF POC: NONE SEEN

## 2012-04-01 LAB — POCT PREGNANCY, URINE: Preg Test, Ur: NEGATIVE

## 2012-04-01 MED ORDER — AZITHROMYCIN 250 MG PO TABS
ORAL_TABLET | ORAL | Status: AC
  Filled 2012-04-01: qty 4

## 2012-04-01 MED ORDER — CEFTRIAXONE SODIUM 1 G IJ SOLR
INTRAMUSCULAR | Status: AC
  Filled 2012-04-01: qty 10

## 2012-04-01 MED ORDER — CEFTRIAXONE SODIUM 250 MG IJ SOLR
250.0000 mg | Freq: Once | INTRAMUSCULAR | Status: AC
  Administered 2012-04-01: 250 mg via INTRAMUSCULAR

## 2012-04-01 MED ORDER — LIDOCAINE HCL (PF) 1 % IJ SOLN
INTRAMUSCULAR | Status: AC
  Filled 2012-04-01: qty 5

## 2012-04-01 MED ORDER — AZITHROMYCIN 1 G PO PACK: 1.0000 g | PACK | Freq: Once | ORAL | Status: DC

## 2012-04-01 MED ORDER — AZITHROMYCIN 250 MG PO TABS
1000.0000 mg | ORAL_TABLET | Freq: Once | ORAL | Status: AC
  Administered 2012-04-01: 1000 mg via ORAL

## 2012-04-01 NOTE — ED Provider Notes (Signed)
Connie Jenkins is a 31 y.o. female who presents to Urgent Care today for days of vaginal pain. Associated with mild discharge and dysuria. Patient has pain with urination. She denies any fevers or chills or new sexual partners. Her last dose. Was several years ago however she has been on Depo-Provera until one year ago.  She denies any abdominal pain nausea vomiting or diarrhea.   PMH reviewed. Significant for genital herpes and anovulatory state. ROS as above otherwise neg.  no chest pains, palpitations, fevers, chills, abdominal pain nausea or vomiting. Medications reviewed. Current Facility-Administered Medications  Medication Dose Route Frequency Provider Last Rate Last Dose  . azithromycin (ZITHROMAX) tablet 1,000 mg  1,000 mg Oral Once Rodolph Bong, MD      . cefTRIAXone (ROCEPHIN) injection 250 mg  250 mg Intramuscular Once Rodolph Bong, MD      . DISCONTD: azithromycin (ZITHROMAX) powder 1 g  1 g Oral Once Rodolph Bong, MD       Current Outpatient Prescriptions  Medication Sig Dispense Refill  . DiphenhydrAMINE HCl (BENADRYL ALLERGY PO) Take 2 capsules by mouth daily as needed. Patient took this medication because of itching that occurred from eating fish.      Marland Kitchen OVER THE COUNTER MEDICATION Take 5 mLs by mouth daily as needed. Patient used this medication for a cold.  The medication was called Nite Time.      . valACYclovir (VALTREX) 500 MG tablet Take 500 mg by mouth 2 (two) times daily.       patient has a known allergy to penicillin.  She cannot remember what exactly she had however there was a rash.  She notes that she has had a ceftriaxone injection before and done well without any anaphylaxis.  Exam:  BP 100/64  Pulse 80  Temp(Src) 99.8 F (37.7 C) (Oral)  Resp 18  SpO2 97% Gen: Well NAD ABD: NABS, NT, ND GYN: Normal external genitalia. White discharge present. Cervical motion tenderness. No adnexal mass or tenderness  Results for orders placed during the hospital  encounter of 04/01/12 (from the past 24 hour(s))  WET PREP, GENITAL     Status: Abnormal   Collection Time   04/01/12  5:03 PM      Component Value Range   Yeast Wet Prep HPF POC NONE SEEN  NONE SEEN    Trich, Wet Prep NONE SEEN  NONE SEEN    Clue Cells Wet Prep HPF POC FEW (*) NONE SEEN    WBC, Wet Prep HPF POC FEW (*) NONE SEEN   POCT URINALYSIS DIP (DEVICE)     Status: Abnormal   Collection Time   04/01/12  5:04 PM      Component Value Range   Glucose, UA NEGATIVE  NEGATIVE (mg/dL)   Bilirubin Urine SMALL (*) NEGATIVE    Ketones, ur TRACE (*) NEGATIVE (mg/dL)   Specific Gravity, Urine 1.025  1.005 - 1.030    Hgb urine dipstick TRACE (*) NEGATIVE    pH 6.0  5.0 - 8.0    Protein, ur NEGATIVE  NEGATIVE (mg/dL)   Urobilinogen, UA 0.2  0.0 - 1.0 (mg/dL)   Nitrite POSITIVE (*) NEGATIVE    Leukocytes, UA NEGATIVE  NEGATIVE   POCT PREGNANCY, URINE     Status: Normal   Collection Time   04/01/12  5:09 PM      Component Value Range   Preg Test, Ur NEGATIVE  NEGATIVE    No results found.  Assessment and  Plan: 31 y.o. female with cervical motion tenderness.  This is obviously concerning for pelvic inflammatory disease. Plan to treat empirically with ceftriaxone 250 mg IM and azithromycin 1 g by mouth.  We will followup wet prep and gonorrhea chlamydia swabs were taken today. Recommend followup with OB/GYN or primary care doctor in one week.  Additionally her urine shows positive nitrites however it also shows protein ketones and hemoglobin which makes me think this is probably contaminated.  Regardless 250 mg IM ceftriaxone should take care of urinary tract infection as well.       Rodolph Bong, MD 04/01/12 [phone]

## 2012-04-01 NOTE — Discharge Instructions (Signed)
Thank you for coming in today. I think you may have PID.  We will know for sure in a few days.  We will do the treatment today to be sure.  Let me know if you get worse.

## 2012-04-01 NOTE — ED Notes (Signed)
C/o vaginal pain and discharge, pressure with urination, urinary frequency and voiding small amounts.  Sx started on Saturday.

## 2012-04-02 NOTE — ED Provider Notes (Signed)
Medical screening examination/treatment/procedure(s) were performed by PGY-3 [facility] resident and as supervising physician I was immediately available for consultation/collaboration.   Sharin Grave, MD   Sharin Grave, MD 04/02/12 [phone]

## 2012-06-12 ENCOUNTER — Inpatient Hospital Stay (HOSPITAL_COMMUNITY)
Admission: AD | Admit: 2012-06-12 | Discharge: 2012-06-12 | Disposition: A | Discharge status: Home / Self Care | Payer: Medicaid Other | Source: Ambulatory Visit | Attending: Obstetrics | Admitting: Obstetrics

## 2012-06-12 ENCOUNTER — Encounter (HOSPITAL_COMMUNITY): Payer: Self-pay | Admitting: *Deleted

## 2012-06-12 DIAGNOSIS — R1013 Epigastric pain: Secondary | ICD-10-CM | POA: Insufficient documentation

## 2012-06-12 DIAGNOSIS — N912 Amenorrhea, unspecified: Secondary | ICD-10-CM | POA: Insufficient documentation

## 2012-06-12 DIAGNOSIS — Z3202 Encounter for pregnancy test, result negative: Secondary | ICD-10-CM | POA: Insufficient documentation

## 2012-06-12 DIAGNOSIS — R109 Unspecified abdominal pain: Secondary | ICD-10-CM | POA: Insufficient documentation

## 2012-06-12 DIAGNOSIS — D279 Benign neoplasm of unspecified ovary: Secondary | ICD-10-CM | POA: Diagnosis present

## 2012-06-12 DIAGNOSIS — R1010 Upper abdominal pain, unspecified: Secondary | ICD-10-CM

## 2012-06-12 LAB — URINALYSIS, ROUTINE W REFLEX MICROSCOPIC
Bilirubin Urine: NEGATIVE
Ketones, ur: NEGATIVE mg/dL
Leukocytes, UA: NEGATIVE
Nitrite: NEGATIVE
Specific Gravity, Urine: 1.02 (ref 1.005–1.030)
Urobilinogen, UA: 0.2 mg/dL (ref 0.0–1.0)
pH: 7 (ref 5.0–8.0)

## 2012-06-12 LAB — WET PREP, GENITAL: Clue Cells Wet Prep HPF POC: NONE SEEN

## 2012-06-12 MED ORDER — GI COCKTAIL ~~LOC~~
30.0000 mL | Freq: Once | ORAL | Status: AC
  Administered 2012-06-12: 30 mL via ORAL
  Filled 2012-06-12: qty 30

## 2012-06-12 MED ORDER — PANTOPRAZOLE SODIUM 20 MG PO TBEC: 20.0000 mg | DELAYED_RELEASE_TABLET | Freq: Every day | ORAL | Status: DC

## 2012-06-12 NOTE — MAU Provider Note (Signed)
History     CSN: [id number]  Arrival date and time: 06/12/12 1657   First Provider Initiated Contact with Patient 06/12/12 1758      Chief Complaint  Patient presents with  . Abdominal Pain  . Nausea  . Possible Pregnancy   HPI This is a 31 y.o. female who presents with c/o upper abdominal pain for 3-4 days. Also has had nausea for that long. Also concerned that her last DepoProvera was 4 yrs ago and she has  Not had a period since. Had unprotected IC 30 days ago and is worried she might have an STD or be pregnant.  Denies vomiting, diarrhea, constipation or fever.  Eating makes epigastric pain worse.  States has called office twice and they never called her back.  Hx remarkable for febrile illness last year (?diagnosis) and hernia surgery.   OB History    Grav Para Term Preterm Abortions TAB SAB Ect Mult Living   3 2 1 1 1  1   2       Past Medical History  Diagnosis Date  . Febrile illness, acute 01/2011    Hospitalized for 6 days  . Herpes 2008  . Asthma     Past Surgical History  Procedure Date  . Cesarean section 2000 & 2004    "1st one I dilated 1cm, then the 2nd one was a rpt  . Hernia repair     History reviewed. No pertinent family history.  History  Substance Use Topics  . Smoking status: Never Smoker   . Smokeless tobacco: Never Used  . Alcohol Use: No    Allergies:  Allergies  Allergen Reactions  . Flagyl (Metronidazole) Nausea And Vomiting  . Latex Itching  . Penicillins Other (See Comments)    Childhood allergy; reaction unknown    Prescriptions prior to admission  Medication Sig Dispense Refill  . DiphenhydrAMINE HCl (BENADRYL ALLERGY PO) Take 2 capsules by mouth daily as needed. Patient took this medication because of itching that occurred from eating fish.      Marland Kitchen OVER THE COUNTER MEDICATION Take 5 mLs by mouth daily as needed. Patient used this medication for a cold.  The medication was called Nite Time.      . valACYclovir (VALTREX)  500 MG tablet Take 500 mg by mouth 2 (two) times daily.        ROS As listed in HPI  Physical Exam   Blood pressure 111/68, pulse 104, temperature 99.5 F (37.5 C), temperature source Oral, resp. rate 18, height 5' 3.5" (1.613 m), weight 204 lb (92.534 kg), SpO2 100.00%.  Physical Exam  Constitutional: She is oriented to person, place, and time. She appears well-developed and well-nourished. No distress.  HENT:  Head: Normocephalic.  Cardiovascular: Normal rate.   Respiratory: Effort normal.  GI: Soft. She exhibits no distension and no mass. There is tenderness (slight epigastric). There is no rebound and no guarding.  Genitourinary: Uterus normal. Vaginal discharge (thick white) found.       No adnexal tenderness No uterine tenderness No CMT  Musculoskeletal: Normal range of motion.  Neurological: She is alert and oriented to person, place, and time.  Skin: Skin is warm and dry.  Psychiatric: She has a normal mood and affect.   Results for orders placed during the hospital encounter of 06/12/12 (from the past 24 hour(s))  POCT PREGNANCY, URINE     Status: Normal   Collection Time   06/12/12  5:23 PM  Component Value Range   Preg Test, Ur NEGATIVE  NEGATIVE  URINALYSIS, ROUTINE W REFLEX MICROSCOPIC     Status: Normal   Collection Time   06/12/12  5:32 PM      Component Value Range   Color, Urine YELLOW  YELLOW   APPearance CLEAR  CLEAR   Specific Gravity, Urine 1.020  1.005 - 1.030   pH 7.0  5.0 - 8.0   Glucose, UA NEGATIVE  NEGATIVE mg/dL   Hgb urine dipstick NEGATIVE  NEGATIVE   Bilirubin Urine NEGATIVE  NEGATIVE   Ketones, ur NEGATIVE  NEGATIVE mg/dL   Protein, ur NEGATIVE  NEGATIVE mg/dL   Urobilinogen, UA 0.2  0.0 - 1.0 mg/dL   Nitrite NEGATIVE  NEGATIVE   Leukocytes, UA NEGATIVE  NEGATIVE  WET PREP, GENITAL     Status: Abnormal   Collection Time   06/12/12  6:10 PM      Component Value Range   Yeast Wet Prep HPF POC NONE SEEN  NONE SEEN   Trich, Wet Prep  NONE SEEN  NONE SEEN   Clue Cells Wet Prep HPF POC NONE SEEN  NONE SEEN   WBC, Wet Prep HPF POC FEW (*) NONE SEEN     MAU Course  Procedures  MDM GC/Chlamydia and Wet Prep done >> Wet prep normal  Gave GI cocktail to see if it helped upper abd. Pain >> feels somewhat better  Assessment and Plan  A:  Upper abdominal/epigastric pain, suspect GERD or PUD      Secondary Amenorrhea 4 yrs following DepoProvera      Negative Pregnancy test P:  Discharge home       Rx Protonix       Followup in office      Will refer to [facility] for medical evaluation and care of upper GI pain  [facility] 06/12/2012, 6:12 PM

## 2012-06-12 NOTE — MAU Note (Signed)
Patient states she started having mid to upper abdominal pain and nausea with eating on 7-14. Has been having a white discharge for about one week. States she had Depo last about 4 years ago and has never had a period since that time.

## 2012-06-13 LAB — GC/CHLAMYDIA PROBE AMP, GENITAL: Chlamydia, DNA Probe: NEGATIVE

## 2012-09-25 ENCOUNTER — Encounter (HOSPITAL_COMMUNITY): Payer: Self-pay

## 2012-09-25 ENCOUNTER — Emergency Department (HOSPITAL_COMMUNITY)
Admission: EM | Admit: 2012-09-25 | Discharge: 2012-09-25 | Disposition: A | Discharge status: Home / Self Care | Payer: Medicaid Other | Source: Home / Self Care | Attending: Family Medicine | Admitting: Family Medicine

## 2012-09-25 DIAGNOSIS — J40 Bronchitis, not specified as acute or chronic: Secondary | ICD-10-CM

## 2012-09-25 DIAGNOSIS — J069 Acute upper respiratory infection, unspecified: Secondary | ICD-10-CM

## 2012-09-25 MED ORDER — IBUPROFEN 600 MG PO TABS: 600.0000 mg | ORAL_TABLET | Freq: Three times a day (TID) | ORAL | Status: DC | PRN

## 2012-09-25 MED ORDER — PREDNISONE 20 MG PO TABS: ORAL_TABLET | ORAL | Status: DC

## 2012-09-25 MED ORDER — GUAIFENESIN-CODEINE 100-10 MG/5ML PO SYRP: 5.0000 mL | ORAL_SOLUTION | Freq: Three times a day (TID) | ORAL | Status: DC | PRN

## 2012-09-25 MED ORDER — BENZONATATE 100 MG PO CAPS: 100.0000 mg | ORAL_CAPSULE | Freq: Three times a day (TID) | ORAL | Status: DC

## 2012-09-25 MED ORDER — CETIRIZINE HCL 10 MG PO TABS: 10.0000 mg | ORAL_TABLET | Freq: Every day | ORAL | Status: DC

## 2012-09-25 MED ORDER — ALBUTEROL SULFATE HFA 108 (90 BASE) MCG/ACT IN AERS: 1.0000 | INHALATION_SPRAY | Freq: Four times a day (QID) | RESPIRATORY_TRACT | Status: DC | PRN

## 2012-09-25 NOTE — ED Notes (Signed)
Sore throat, non productive cough, fever on Sat/Sun

## 2012-09-26 NOTE — ED Provider Notes (Signed)
History     CSN: [id number]  Arrival date & time 09/25/12  7829   First MD Initiated Contact with Patient 09/25/12 [phone]      Chief Complaint  Patient presents with  . Sore Throat    (Consider location/radiation/quality/duration/timing/severity/associated sxs/prior treatment) HPI Comments: 31 y/o female with h/o seasonal allergies, second hand smoking and asthma here c/o persistent cough with non productive sputum associated with nasal congestion, intermittent wheezing and sore throat. Patient reports subjective fever last time 3 days ago. Tolerating fluids and solids well. No rash, no nausea vomiting or diarrhea. No chest pain or abdominal pain although chest feels thight at night time. Does not have albuterol inhaler at home. Cough really bothering her.   Past Medical History  Diagnosis Date  . Febrile illness, acute 01/2011    Hospitalized for 6 days  . Herpes 2008  . Asthma     Past Surgical History  Procedure Date  . Cesarean section 2000 & 2004    "1st one I dilated 1cm, then the 2nd one was a rpt  . Hernia repair     No family history on file.  History  Substance Use Topics  . Smoking status: Never Smoker   . Smokeless tobacco: Never Used  . Alcohol Use: No    OB History    Grav Para Term Preterm Abortions TAB SAB Ect Mult Living   3 2 1 1 1  1   2       Review of Systems  Constitutional: Negative for diaphoresis, appetite change and fatigue.  HENT: Positive for congestion, sore throat, rhinorrhea and sneezing. Negative for ear pain, nosebleeds, facial swelling, trouble swallowing and neck pain.   Eyes: Negative for discharge.  Respiratory: Positive for cough, chest tightness and wheezing.   Cardiovascular: Negative for chest pain and leg swelling.  Gastrointestinal: Negative for nausea, vomiting, abdominal pain, diarrhea and abdominal distention.  Musculoskeletal: Negative for myalgias and arthralgias.  Skin: Negative for rash.  Neurological: Negative  for dizziness and headaches.  All other systems reviewed and are negative.    Allergies  Flagyl; Latex; and Penicillins  Home Medications   Current Outpatient Rx  Name Route Sig Dispense Refill  . ALBUTEROL SULFATE HFA 108 (90 BASE) MCG/ACT IN AERS Inhalation Inhale 1-2 puffs into the lungs every 6 (six) hours as needed for wheezing. 1 Inhaler 0  . BENZONATATE 100 MG PO CAPS Oral Take 1 capsule (100 mg total) by mouth every 8 (eight) hours. 20 capsule 0  . CETIRIZINE HCL 10 MG PO TABS Oral Take 1 tablet (10 mg total) by mouth daily. 30 tablet 0  . GUAIFENESIN-CODEINE 100-10 MG/5ML PO SYRP Oral Take 5 mLs by mouth 3 (three) times daily as needed for cough. 120 mL 0  . IBUPROFEN 600 MG PO TABS Oral Take 1 tablet (600 mg total) by mouth every 8 (eight) hours as needed for pain or fever. 20 tablet 0  . OVER THE COUNTER MEDICATION Oral Take 5 mLs by mouth daily as needed. Patient used this medication for a cold.  The medication was called Nite Time.    Marland Kitchen PANTOPRAZOLE SODIUM 20 MG PO TBEC Oral Take 1 tablet (20 mg total) by mouth daily. 30 tablet 1  . PREDNISONE 20 MG PO TABS  2 tabs po daily for 3 days. 10 tablet 0  . VALACYCLOVIR HCL 500 MG PO TABS Oral Take 500 mg by mouth 2 (two) times daily.      BP 120/75  Pulse  88  Temp 98.3 F (36.8 C) (Oral)  Resp 17  SpO2 97%  Physical Exam  Nursing note and vitals reviewed. Constitutional: She is oriented to person, place, and time. She appears well-developed and well-nourished. No distress.  HENT:  Head: Normocephalic and atraumatic.       Nasal Congestion with erythema and swelling of nasal turbinates, clear rhinorrhea.  pharyngeal erythema no exudates. No uvula deviation. No trismus. TM's normal  Eyes: Conjunctivae normal and EOM are normal. Pupils are equal, round, and reactive to light. Right eye exhibits no discharge. Left eye exhibits no discharge.  Neck: Normal range of motion. Neck supple. No JVD present.  Cardiovascular: Normal  rate, regular rhythm and normal heart sounds.   Pulmonary/Chest: Effort normal. No respiratory distress. She has no wheezes. She has no rales. She exhibits no tenderness.       Scattered rhonchi bilaterally. Bronchitic cough. No active wheezing.  Lymphadenopathy:    She has no cervical adenopathy.  Neurological: She is alert and oriented to person, place, and time.  Skin: No rash noted. She is not diaphoretic.    ED Course  Procedures (including critical care time)   Labs Reviewed  POCT RAPID STREP A ([facility] ONLY)   No results found.   1. URI (upper respiratory infection)   2. Bronchitis       MDM  Negative rapid strep test. Impress viral bronchitis vs asthma exacerbation triggered by URI. Prescribed albuterol, prednisone, tessalon Perls, guaifenesin/codein and ibuprofen. Supportive care and red flags that should prompt her return to medical attention discussed with patient and provided in writing.         Sharin Grave, MD 09/26/12 [phone]

## 2012-09-27 DIAGNOSIS — Z8709 Personal history of other diseases of the respiratory system: Secondary | ICD-10-CM

## 2012-09-27 HISTORY — DX: Personal history of other diseases of the respiratory system: Z87.09

## 2012-10-01 ENCOUNTER — Encounter (HOSPITAL_BASED_OUTPATIENT_CLINIC_OR_DEPARTMENT_OTHER): Payer: Self-pay | Admitting: *Deleted

## 2012-10-01 ENCOUNTER — Emergency Department (HOSPITAL_BASED_OUTPATIENT_CLINIC_OR_DEPARTMENT_OTHER)
Admission: EM | Admit: 2012-10-01 | Discharge: 2012-10-01 | Disposition: A | Discharge status: Home / Self Care | Payer: Medicaid Other | Attending: Emergency Medicine | Admitting: Emergency Medicine

## 2012-10-01 DIAGNOSIS — Z3202 Encounter for pregnancy test, result negative: Secondary | ICD-10-CM | POA: Insufficient documentation

## 2012-10-01 DIAGNOSIS — R197 Diarrhea, unspecified: Secondary | ICD-10-CM

## 2012-10-01 DIAGNOSIS — J45909 Unspecified asthma, uncomplicated: Secondary | ICD-10-CM | POA: Insufficient documentation

## 2012-10-01 DIAGNOSIS — Z791 Long term (current) use of non-steroidal anti-inflammatories (NSAID): Secondary | ICD-10-CM | POA: Insufficient documentation

## 2012-10-01 DIAGNOSIS — Z79899 Other long term (current) drug therapy: Secondary | ICD-10-CM | POA: Insufficient documentation

## 2012-10-01 DIAGNOSIS — Z8619 Personal history of other infectious and parasitic diseases: Secondary | ICD-10-CM | POA: Insufficient documentation

## 2012-10-01 LAB — URINALYSIS, ROUTINE W REFLEX MICROSCOPIC
Leukocytes, UA: NEGATIVE
Nitrite: NEGATIVE
Protein, ur: NEGATIVE mg/dL
Specific Gravity, Urine: 1.024 (ref 1.005–1.030)
Urobilinogen, UA: 0.2 mg/dL (ref 0.0–1.0)

## 2012-10-01 LAB — PREGNANCY, URINE: Preg Test, Ur: NEGATIVE

## 2012-10-01 LAB — URINE MICROSCOPIC-ADD ON

## 2012-10-01 MED ORDER — LOPERAMIDE HCL 2 MG PO CAPS: 2.0000 mg | ORAL_CAPSULE | Freq: Four times a day (QID) | ORAL | Status: DC | PRN

## 2012-10-01 NOTE — ED Notes (Signed)
One hour ago started having sharp abdominal pain hot flashes ambulatory from ems to exam room with steady gait

## 2012-10-01 NOTE — ED Provider Notes (Addendum)
History     CSN: [id number]  Arrival date & time 10/01/12  1315   First MD Initiated Contact with Patient 10/01/12 1316      Chief Complaint  Patient presents with  . Abdominal Pain    HPI One hour prior to arrival the patient had some abdominal pain and diarrhea associated with some crampy abdominal pain.  2 associated with some hot flashes and sweatiness.  Pain is now gone.  Patient feels back to normal.  Patient denies any blood in stool.  Patient has had no antibiotics recently.  Unsure about pregnancy status. Past Medical History  Diagnosis Date  . Febrile illness, acute 01/2011    Hospitalized for 6 days  . Herpes 2008  . Asthma     Past Surgical History  Procedure Date  . Cesarean section 2000 & 2004    "1st one I dilated 1cm, then the 2nd one was a rpt  . Hernia repair     History reviewed. No pertinent family history.  History  Substance Use Topics  . Smoking status: Never Smoker   . Smokeless tobacco: Never Used  . Alcohol Use: No    OB History    Grav Para Term Preterm Abortions TAB SAB Ect Mult Living   3 2 1 1 1  1   2       Review of Systems All other systems reviewed and are negative Allergies  Flagyl; Latex; and Penicillins  Home Medications   Current Outpatient Rx  Name  Route  Sig  Dispense  Refill  . ALBUTEROL SULFATE HFA 108 (90 BASE) MCG/ACT IN AERS   Inhalation   Inhale 1-2 puffs into the lungs every 6 (six) hours as needed for wheezing.   1 Inhaler   0   . BENZONATATE 100 MG PO CAPS   Oral   Take 1 capsule (100 mg total) by mouth every 8 (eight) hours.   20 capsule   0   . CETIRIZINE HCL 10 MG PO TABS   Oral   Take 1 tablet (10 mg total) by mouth daily.   30 tablet   0   . GUAIFENESIN-CODEINE 100-10 MG/5ML PO SYRP   Oral   Take 5 mLs by mouth 3 (three) times daily as needed for cough.   120 mL   0   . IBUPROFEN 600 MG PO TABS   Oral   Take 1 tablet (600 mg total) by mouth every 8 (eight) hours as needed for pain or  fever.   20 tablet   0   . LOPERAMIDE HCL 2 MG PO CAPS   Oral   Take 1 capsule (2 mg total) by mouth 4 (four) times daily as needed for diarrhea or loose stools.   12 capsule   0   . OVER THE COUNTER MEDICATION   Oral   Take 5 mLs by mouth daily as needed. Patient used this medication for a cold.  The medication was called Nite Time.         Marland Kitchen PANTOPRAZOLE SODIUM 20 MG PO TBEC   Oral   Take 1 tablet (20 mg total) by mouth daily.   30 tablet   1   . PREDNISONE 20 MG PO TABS      2 tabs po daily for 3 days.   10 tablet   0   . VALACYCLOVIR HCL 500 MG PO TABS   Oral   Take 500 mg by mouth 2 (two) times daily.  BP 111/78  Pulse 73  Temp 98.3 F (36.8 C) (Oral)  Resp 18  Ht 5\' 2"  (1.575 m)  Wt 200 lb (90.719 kg)  BMI 36.58 kg/m2  SpO2 100%  Physical Exam  Nursing note and vitals reviewed. Constitutional: She is oriented to person, place, and time. She appears well-developed and well-nourished. No distress.  HENT:  Head: Normocephalic and atraumatic.  Eyes: Pupils are equal, round, and reactive to light.  Neck: Normal range of motion.  Cardiovascular: Normal rate and intact distal pulses.   Pulmonary/Chest: No respiratory distress.  Abdominal: Normal appearance. She exhibits no distension. There is no tenderness. There is no rebound and no guarding.  Musculoskeletal: Normal range of motion.  Neurological: She is alert and oriented to person, place, and time. No cranial nerve deficit.  Skin: Skin is warm and dry. No rash noted.  Psychiatric: She has a normal mood and affect. Her behavior is normal.    ED Course  Procedures (including critical care time)  Labs Reviewed  URINALYSIS, ROUTINE W REFLEX MICROSCOPIC - Abnormal; Notable for the following:    APPearance CLOUDY (*)     Hgb urine dipstick TRACE (*)     All other components within normal limits  URINE MICROSCOPIC-ADD ON - Abnormal; Notable for the following:    Squamous Epithelial / LPF  MANY (*)     Bacteria, UA MANY (*)     All other components within normal limits  PREGNANCY, URINE  URINE CULTURE   No results found.   1. Diarrhea       MDM          Nelia Shi, MD 10/01/12 1410  Nelia Shi, MD 10/01/12 [phone]

## 2012-10-01 NOTE — ED Notes (Signed)
MD at bedside. 

## 2012-10-03 LAB — URINE CULTURE: Colony Count: 40000

## 2012-10-04 NOTE — ED Notes (Signed)
+   Urine Chart sent to EDP office for review. 

## 2012-10-05 ENCOUNTER — Telehealth (HOSPITAL_COMMUNITY): Payer: Self-pay | Admitting: Emergency Medicine

## 2012-10-05 NOTE — ED Notes (Signed)
Chart returned from EDP office. Prescribed Cipro 500 mg PO BID x 7 days. Prescribed by Hannah Muthersbaugh PA-C. °

## 2012-10-06 ENCOUNTER — Telehealth (HOSPITAL_COMMUNITY): Payer: Self-pay | Admitting: Emergency Medicine

## 2012-12-14 ENCOUNTER — Encounter (HOSPITAL_COMMUNITY): Payer: Self-pay | Admitting: Obstetrics and Gynecology

## 2012-12-14 ENCOUNTER — Inpatient Hospital Stay (HOSPITAL_COMMUNITY)
Admission: AD | Admit: 2012-12-14 | Discharge: 2012-12-14 | Disposition: A | Discharge status: Home / Self Care | Payer: Medicaid Other | Source: Ambulatory Visit | Attending: Obstetrics | Admitting: Obstetrics

## 2012-12-14 DIAGNOSIS — Z202 Contact with and (suspected) exposure to infections with a predominantly sexual mode of transmission: Secondary | ICD-10-CM

## 2012-12-14 DIAGNOSIS — N949 Unspecified condition associated with female genital organs and menstrual cycle: Secondary | ICD-10-CM

## 2012-12-14 DIAGNOSIS — N911 Secondary amenorrhea: Secondary | ICD-10-CM

## 2012-12-14 DIAGNOSIS — N912 Amenorrhea, unspecified: Secondary | ICD-10-CM

## 2012-12-14 LAB — URINALYSIS, ROUTINE W REFLEX MICROSCOPIC
Bilirubin Urine: NEGATIVE
Ketones, ur: NEGATIVE mg/dL
Nitrite: NEGATIVE
Protein, ur: 100 mg/dL — AB
Specific Gravity, Urine: >1.03 — ABNORMAL HIGH (ref 1.005–1.030)
Urobilinogen, UA: 0.2 mg/dL (ref 0.0–1.0)

## 2012-12-14 LAB — URINE MICROSCOPIC-ADD ON

## 2012-12-14 MED ORDER — CEFTRIAXONE SODIUM 250 MG IJ SOLR
250.0000 mg | INTRAMUSCULAR | Status: AC
  Administered 2012-12-14: 250 mg via INTRAMUSCULAR
  Filled 2012-12-14: qty 250

## 2012-12-14 MED ORDER — AZITHROMYCIN 250 MG PO TABS
1000.0000 mg | ORAL_TABLET | ORAL | Status: AC
  Administered 2012-12-14: 1000 mg via ORAL
  Filled 2012-12-14: qty 4
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
## 2012-12-14 NOTE — MAU Provider Note (Signed)
Chief Complaint: Vaginal Discharge and Exposure to STD   First Provider Initiated Contact with Patient 12/14/12 1011     SUBJECTIVE HPI: Connie Jenkins is a 32 y.o. [id number] at Unknown by LMP who presents to maternity admissions reporting known exposure to chlamydia by her ex-boyfriend.  She has hx of HSV and is having irritation in her vagina x3 days but is not sure if she is having an outbreak or if something else is going on.  She has not had a menses since 2008 but has had some breast tenderness recently and is concerned she is pregnant.  She had regular periods prior to Depo Provera but has not had any vaginal bleeding since stopping Depo in 2008, despite various hormonal treatments per pt.  She denies vaginal bleeding, urinary symptoms, h/a, dizziness, n/v, or fever/chills.     Past Medical History  Diagnosis Date  . Febrile illness, acute 01/2011    Hospitalized for 6 days  . Herpes 2008  . Asthma    Past Surgical History  Procedure Date  . Cesarean section 2000 & 2004    "1st one I dilated 1cm, then the 2nd one was a rpt  . Hernia repair    History   Social History  . Marital Status: Single    Spouse Name: N/A    Number of Children: N/A  . Years of Education: N/A   Occupational History  . Not on file.   Social History Main Topics  . Smoking status: Never Smoker   . Smokeless tobacco: Never Used  . Alcohol Use: No  . Drug Use: No  . Sexually Active: Yes -- Female partner(s)    Birth Control/ Protection: Condom     Comment: last intercourse was 2 weeks   Other Topics Concern  . Not on file   Social History Narrative  . No narrative on file   No current facility-administered medications on file prior to encounter.   Current Outpatient Prescriptions on File Prior to Encounter  Medication Sig Dispense Refill  . valACYclovir (VALTREX) 500 MG tablet Take 500 mg by mouth daily.       Marland Kitchen albuterol (PROVENTIL HFA;VENTOLIN HFA) 108 (90 BASE) MCG/ACT inhaler Inhale 1-2  puffs into the lungs every 6 (six) hours as needed for wheezing.  1 Inhaler  0  . [DISCONTINUED] DiphenhydrAMINE HCl (BENADRYL ALLERGY PO) Take 2 capsules by mouth daily as needed. Patient took this medication because of itching that occurred from eating fish.       Allergies  Allergen Reactions  . Flagyl (Metronidazole) Nausea And Vomiting  . Latex Itching  . Penicillins Hives and Other (See Comments)    Childhood allergy; reaction unknown    ROS: Pertinent items in HPI  OBJECTIVE Blood pressure 111/74, pulse 96, temperature 98.3 F (36.8 C), temperature source Oral, resp. rate 18, height 5\' 3"  (1.6 m), weight 95.981 kg (211 lb 9.6 oz). GENERAL: Well-developed, well-nourished female in no acute distress.  HEENT: Normocephalic HEART: normal rate RESP: normal effort ABDOMEN: Soft, non-tender EXTREMITIES: Nontender, no edema NEURO: Alert and oriented Pelvic exam: Cervix pink, visually closed, without lesion, moderate amount yellow thick discharge, vaginal walls and external genitalia normal Bimanual exam: Cervix 0/long/high, firm, anterior, neg CMT, uterus nontender, nonenlarged, adnexa without tenderness, enlargement, or mass  LAB RESULTS Results for orders placed during the hospital encounter of 12/14/12 (from the past 24 hour(s))  URINALYSIS, ROUTINE W REFLEX MICROSCOPIC     Status: Abnormal   Collection Time  12/14/12  9:30 AM      Component Value Range   Color, Urine YELLOW  YELLOW   APPearance CLOUDY (*) CLEAR   Specific Gravity, Urine >1.030 (*) 1.005 - 1.030   pH 6.0  5.0 - 8.0   Glucose, UA NEGATIVE  NEGATIVE mg/dL   Hgb urine dipstick LARGE (*) NEGATIVE   Bilirubin Urine NEGATIVE  NEGATIVE   Ketones, ur NEGATIVE  NEGATIVE mg/dL   Protein, ur 161 (*) NEGATIVE mg/dL   Urobilinogen, UA 0.2  0.0 - 1.0 mg/dL   Nitrite NEGATIVE  NEGATIVE   Leukocytes, UA TRACE (*) NEGATIVE  URINE MICROSCOPIC-ADD ON     Status: Abnormal   Collection Time   12/14/12  9:30 AM       Component Value Range   Squamous Epithelial / LPF MANY (*) RARE   WBC, UA TOO NUMEROUS TO COUNT  <3 WBC/hpf   RBC / HPF 21-50  <3 RBC/hpf   Bacteria, UA MANY (*) RARE  POCT PREGNANCY, URINE     Status: Normal   Collection Time   12/14/12  9:54 AM      Component Value Range   Preg Test, Ur NEGATIVE  NEGATIVE  WET PREP, GENITAL     Status: Abnormal   Collection Time   12/14/12 10:07 AM      Component Value Range   Yeast Wet Prep HPF POC NONE SEEN  NONE SEEN   Trich, Wet Prep NONE SEEN  NONE SEEN   Clue Cells Wet Prep HPF POC FEW (*) NONE SEEN   WBC, Wet Prep HPF POC TOO NUMEROUS TO COUNT (*) NONE SEEN     ASSESSMENT 1. Exposure to sexually transmitted disease (STD)   2. Secondary amenorrhea     PLAN Azithromycin 1000 mg PO and Rocephin 250 mg IM given in MAU for STD exposure Discharge home F/U with provider regarding amenorrhea Increase daily water intake Return to MAU as needed    Medication List     As of 12/14/2012 10:21 AM    ASK your doctor about these medications         albuterol 108 (90 BASE) MCG/ACT inhaler   Commonly known as: PROVENTIL HFA;VENTOLIN HFA   Inhale 1-2 puffs into the lungs every 6 (six) hours as needed for wheezing.      valACYclovir 500 MG tablet   Commonly known as: VALTREX   Take 500 mg by mouth daily.         Sharen Counter Certified Nurse-Midwife 12/14/2012  10:21 AM

## 2012-12-14 NOTE — MAU Note (Signed)
Pt reports she was exposed to chlamydia. C/o vag discharge that is white and thick. Reports she has not had a period for 4-5 years. Has seen her doctor about it no dx. Worried she might be pregnant.

## 2012-12-15 LAB — GC/CHLAMYDIA PROBE AMP: CT Probe RNA: NEGATIVE

## 2012-12-16 LAB — URINE CULTURE

## 2012-12-18 ENCOUNTER — Other Ambulatory Visit: Payer: Self-pay | Admitting: Medical

## 2012-12-18 ENCOUNTER — Other Ambulatory Visit (HOSPITAL_COMMUNITY): Payer: Self-pay | Admitting: Medical

## 2012-12-18 DIAGNOSIS — N390 Urinary tract infection, site not specified: Secondary | ICD-10-CM

## 2012-12-18 MED ORDER — CIPROFLOXACIN HCL 500 MG PO TABS: 500.0000 mg | ORAL_TABLET | Freq: Two times a day (BID) | ORAL | Status: DC

## 2012-12-18 NOTE — Progress Notes (Signed)
Called patient and informed her of UTI and Rx at pharmacy. Patient voiced understanding. Patient confirmed she is not currently pregnant.

## 2013-01-02 ENCOUNTER — Other Ambulatory Visit (HOSPITAL_COMMUNITY)
Admission: RE | Admit: 2013-01-02 | Discharge: 2013-01-02 | Disposition: A | Discharge status: Home / Self Care | Payer: Medicaid Other | Source: Ambulatory Visit | Attending: Medical | Admitting: Medical

## 2013-01-02 ENCOUNTER — Ambulatory Visit (INDEPENDENT_AMBULATORY_CARE_PROVIDER_SITE_OTHER): Payer: Medicaid Other | Admitting: Medical

## 2013-01-02 ENCOUNTER — Encounter: Payer: Self-pay | Admitting: Medical

## 2013-01-02 VITALS — BP 115/77 | HR 86 | Temp 98.0°F | Ht 63.5 in | Wt 209.9 lb

## 2013-01-02 DIAGNOSIS — Z113 Encounter for screening for infections with a predominantly sexual mode of transmission: Secondary | ICD-10-CM | POA: Insufficient documentation

## 2013-01-02 DIAGNOSIS — N898 Other specified noninflammatory disorders of vagina: Secondary | ICD-10-CM

## 2013-01-02 DIAGNOSIS — Z7251 High risk heterosexual behavior: Secondary | ICD-10-CM

## 2013-01-02 DIAGNOSIS — N760 Acute vaginitis: Secondary | ICD-10-CM | POA: Insufficient documentation

## 2013-01-02 DIAGNOSIS — N912 Amenorrhea, unspecified: Secondary | ICD-10-CM

## 2013-01-02 LAB — POCT URINALYSIS DIP (DEVICE)
Bilirubin Urine: NEGATIVE
Ketones, ur: NEGATIVE mg/dL
Leukocytes, UA: NEGATIVE
pH: 5.5 (ref 5.0–8.0)

## 2013-01-02 LAB — HEMOGLOBIN A1C
Hgb A1c MFr Bld: 5.4 % (ref <5.7)
Mean Plasma Glucose: 108 mg/dL (ref <117)

## 2013-01-02 NOTE — Progress Notes (Signed)
Patient ID: Connie Jenkins, female   DOB: 12-28-1980, 32 y.o.   MRN: [id number]  Patient c/o unresolved UTI symptoms, discharge, and odor.

## 2013-01-02 NOTE — Patient Instructions (Signed)
Secondary Amenorrhea   Secondary amenorrhea is the stopping of menstrual flow for 3 to 6 months in a female who has previously had periods. There are many possible causes. Most of these causes are not serious. Usually treating the underlying problem causing the loss of menses will return your periods to normal.  CAUSES   Some common and uncommon causes of not menstruating include:  · Malnutrition.  · Low blood sugar (hypoglycemia).  · Polycystic ovarian disease.  · Stress or fear.  · Breastfeeding.  · Hormone imbalance.  · Ovarian failure.  · Medications.  · Extreme obesity.  · Cystic fibrosis.  · Low body weight or drastic weight reduction from any cause.  · Early menopause.  · Removal of ovaries or uterus.  · Contraceptives.  · Illness.  · Long term (chronic) illnesses.  · Cushing's syndrome.  · Thyroid problems.  · Birth control pills, patches, or vaginal rings for birth control.  DIAGNOSIS   This diagnosis is made by your caregiver taking a medical history and doing a physical exam. Pregnancy must be ruled out. Often times, numerous blood tests of different hormones in the body may be measured. Urine testing may be done. Specialized x-rays may have to be done as well as measuring the body mass index (BMI).  TREATMENT   Treatment depends on the cause of the amenorrhea. If an eating disorder is present, this can be treated with an adequate diet and therapy. Chronic illnesses may improve with treatment of the illness. Overall, the outlook is good. The amenorrhea may be corrected with medications, lifestyle changes, or surgery. If the amenorrhea cannot be corrected, it is sometimes possible to create a false menstruation with medications.  Document Released: 12/25/2006 Document Revised: 02/05/2012 Document Reviewed: 11/01/2007  Exit[employer]® Patient Information ©2013 [employer].

## 2013-01-02 NOTE — Progress Notes (Signed)
Patient ID: Connie Jenkins, female   DOB: 30-Jul-1981, 30 y.o.   MRN: [id number]  History:  Connie Jenkins is a 32 y.o. [id number] who presents to clinic today for evaluation of amenorrhea x 5 years. The patient states that she has previously seen Dr. Gaynell Face for this, but would prefer to see a female provider. She was on Depo Provera x 1 year and since discontinuing the Depo she has not had a period. The patient has been put on OCPs x 3 months and did not have a period. The patient also had a 10 day Provera challenge and did not have a period. The patient states that she has breast tenderness and mild cramping around the time that she would have a period most months. The patient is currently sexually active and uses condoms. She would like STD testing today. She is having a vaginal discharge and odor. She often has BV and yeast infections.    The following portions of the patient's history were reviewed and updated as appropriate: allergies, current medications, past family history, past medical history, past social history, past surgical history and problem list.  Review of Systems:  Pertinent items are noted in HPI.  Objective:  Physical Exam BP 115/77  Pulse 86  Temp 98 F (36.7 C) (Oral)  Ht 5' 3.5" (1.613 m)  Wt 209 lb 14.4 oz (95.21 kg)  BMI 36.60 kg/m2 GENERAL: Well-developed, well-nourished female in no acute distress.  HEENT: Normocephalic, atraumatic.  NECK: Supple. Normal thyroid.  LUNGS: Normal rate. Clear to auscultation bilaterally.  HEART: Regular rate and rhythm with no adventitious sounds.  ABDOMEN: Soft, nontender, nondistended. No organomegaly. Normal bowel sounds appreciated in all quadrants.  PELVIC: Normal external female genitalia. Vagina is pink and rugated.  Normal discharge. Normal cervix contour. Aptima obtained. Uterus is normal in size. No adnexal mass or tenderness.  EXTREMITIES: No cyanosis, clubbing, or edema.  Labs and Imaging FSH, LH, Prolactin, TSH and  Hgb A1c obtained today Wet prep, GC/Chlamydia, HIV, RPR, Hep B and Hep C obtained today  Assessment & Plan:  Assessment: Amenorrhea, secondary Vaginal discharge HSV  Plans: 1. Patient will follow-up in 2 weeks for results of labs obtained today 2. Patient will be contacted with any abnormal results from her wet prep and STD testing today  3. Patient will continue on Acyclovir as previously prescribed 4. Patient will need appointment for annual exam following results visit for March, 2014  Freddi Starr, [place] 01/02/2013 4:22 PM

## 2013-01-03 LAB — HIV ANTIBODY (ROUTINE TESTING W REFLEX): HIV: NONREACTIVE

## 2013-01-03 LAB — FOLLICLE STIMULATING HORMONE: FSH: 4.3 m[IU]/mL

## 2013-01-03 LAB — LUTEINIZING HORMONE: LH: 3.4 m[IU]/mL

## 2013-01-03 LAB — PROLACTIN: Prolactin: 11 ng/mL

## 2013-01-03 LAB — HEPATITIS B SURFACE ANTIGEN: Hepatitis B Surface Ag: NEGATIVE

## 2013-01-16 ENCOUNTER — Encounter: Payer: Self-pay | Admitting: Obstetrics & Gynecology

## 2013-01-16 ENCOUNTER — Ambulatory Visit (INDEPENDENT_AMBULATORY_CARE_PROVIDER_SITE_OTHER): Payer: Medicaid Other | Admitting: Obstetrics & Gynecology

## 2013-01-16 VITALS — BP 118/72 | HR 71 | Temp 97.3°F | Ht 63.0 in | Wt 211.8 lb

## 2013-01-16 DIAGNOSIS — N912 Amenorrhea, unspecified: Secondary | ICD-10-CM

## 2013-01-16 DIAGNOSIS — N911 Secondary amenorrhea: Secondary | ICD-10-CM | POA: Insufficient documentation

## 2013-01-16 NOTE — Progress Notes (Signed)
Subjective:     Patient ID: Connie Jenkins, female   DOB: 12-01-1980, 32 y.o.   MRN: [id number]  HPI Pt is a 32 yo G2P2002 with a 5 year h/o amenorrhea.  Pt reports that she has had many evals and no answer for her amenorrhea.  She reports molimenal sx every month at the same time her cycles are scheduled to come.  She reports that she has been on OCP's and Provera in the past with no withdrawal bleed.  She denies h/o uterine instrumentation after her section.  She was on Depo Provera for a while after the delivery of her child and bled on the Depo but, has not had a menses since that time.   Review of Systems     Objective:   Physical Exam BP 118/72  Pulse 71  Temp(Src) 97.3 F (36.3 C) (Oral)  Ht 5\' 3"  (1.6 m)  Wt 211 lb 12.8 oz (96.072 kg)  BMI 37.53 kg/m2     Assessment:     Secondary amenorrhea- reviewed pts labs from her last visit. All were within normal limits.  I discussed with her the option of an other Provera challenge which she declined.  She is interested in having her endometrium viewed directly to r/o Asherman's.  She expressed understanding that it may result in a normal finding     Plan:     Patient desires surgical management with diagnostic hysteroscopy.  The risks of surgery were discussed in detail with the patient including but not limited to: perforation , infection and bleeding The patient also understands the alternative treatment options which were discussed in full. All questions were answered.  She was told that she will be contacted by our surgical scheduler regarding the time and date of her surgery; routine preoperative instructions of having nothing to eat or drink after midnight on the day prior to surgery and also coming to the hospital 1 1/2 hours prior to her time of surgery were also emphasized.  She was told she may be called for a preoperative appointment about a week prior to surgery and will be given further preoperative instructions at that visit.  Printed patient education handouts about the procedure were given to the patient to review at home.

## 2013-01-16 NOTE — Patient Instructions (Addendum)
Secondary Amenorrhea  Secondary amenorrhea is the stopping of menstrual flow for 3 to 6 months in a female who has previously had periods. There are many possible causes. Most of these causes are not serious. Usually treating the underlying problem causing the loss of menses will return your periods to normal. CAUSES  Some common and uncommon causes of not menstruating include:  Malnutrition.  Low blood sugar (hypoglycemia).  Polycystic ovarian disease.  Stress or fear.  Breastfeeding.  Hormone imbalance.  Ovarian failure.  Medications.  Extreme obesity.  Cystic fibrosis.  Low body weight or drastic weight reduction from any cause.  Early menopause.  Removal of ovaries or uterus.  Contraceptives.  Illness.  Long term (chronic) illnesses.  Cushing's syndrome.  Thyroid problems.  Birth control pills, patches, or vaginal rings for birth control. DIAGNOSIS  This diagnosis is made by your caregiver taking a medical history and doing a physical exam. Pregnancy must be ruled out. Often times, numerous blood tests of different hormones in the body may be measured. Urine testing may be done. Specialized x-rays may have to be done as well as measuring the body mass index (BMI). TREATMENT  Treatment depends on the cause of the amenorrhea. If an eating disorder is present, this can be treated with an adequate diet and therapy. Chronic illnesses may improve with treatment of the illness. Overall, the outlook is good. The amenorrhea may be corrected with medications, lifestyle changes, or surgery. If the amenorrhea cannot be corrected, it is sometimes possible to create a false menstruation with medications. Document Released: 12/25/2006 Document Revised: 02/05/2012 Document Reviewed: 11/01/2007 [facility] Patient Information 2013 [place], [place]. Hysteroscopy Hysteroscopy is a procedure used for looking inside the womb (uterus). It may be done for many different reasons,  including:  To evaluate abnormal bleeding, fibroid (benign, noncancerous) tumors, polyps, scar tissue (adhesions), and possibly cancer of the uterus.  To look for lumps (tumors) and other uterine growths.  To look for causes of why a woman cannot get pregnant (infertility), causes of recurrent loss of pregnancy (miscarriages), or a lost intrauterine device (IUD).  To perform a sterilization by blocking the fallopian tubes from inside the uterus. A hysteroscopy should be done right after a menstrual period to be sure you are not pregnant. LET YOUR CAREGIVER KNOW ABOUT:   Allergies.  Medicines taken, including herbs, eyedrops, over-the-counter medicines, and creams.  Use of steroids (by mouth or creams).  Previous problems with anesthetics or numbing medicines.  History of bleeding or blood problems.  History of blood clots.  Possibility of pregnancy, if this applies.  Previous surgery.  Other health problems. RISKS AND COMPLICATIONS   Putting a hole in the uterus.  Excessive bleeding.  Infection.  Damage to the cervix.  Injury to other organs.  Allergic reaction to medicines.  Too much fluid used in the uterus for the procedure. BEFORE THE PROCEDURE   Do not take aspirin or blood thinners for a week before the procedure, or as directed. It can cause bleeding.  Arrive at least 60 minutes before the procedure or as directed to read and sign the necessary forms.  Arrange for someone to take you home after the procedure.  If you smoke, do not smoke for 2 weeks before the procedure. PROCEDURE   Your caregiver may give you medicine to relax you. He or she may also give you a medicine that numbs the area around the cervix (local anesthetic) or a medicine that makes you sleep (general anesthesia).  Sometimes,  a medicine is placed in the cervix the day before the procedure. This medicine makes the cervix have a larger opening (dilate). This makes it easier for the  instrument to be inserted into the uterus.  A small instrument (hysteroscope) is inserted through the vagina into the uterus. This instrument is similar to a pencil-sized telescope with a light.  During the procedure, air or a liquid is put into the uterus, which allows the surgeon to see better.  Sometimes, tissue is gently scraped from inside the uterus. These tissue samples are sent to a specialist who looks at tissue samples (pathologist). The pathologist will give a report to your caregiver. This will help your caregiver decide if further treatment is necessary. The report will also help your caregiver decide on the best treatment if the test comes back abnormal. AFTER THE PROCEDURE   If you had a general anesthetic, you may be groggy for a couple hours after the procedure.  If you had a local anesthetic, you will be advised to rest at the surgical center or caregiver's office until you are stable and feel ready to go home.  You may have some cramping for a couple days.  You may have bleeding, which varies from light spotting for a few days to menstrual-like bleeding for up to 3 to 7 days. This is normal.  Have someone take you home. FINDING OUT THE RESULTS OF YOUR TEST Not all test results are available during your visit. If your test results are not back during the visit, make an appointment with your caregiver to find out the results. Do not assume everything is normal if you have not heard from your caregiver or the medical facility. It is important for you to follow up on all of your test results. HOME CARE INSTRUCTIONS   Do not drive for 24 hours or as instructed.  Only take over-the-counter or prescription medicines for pain, discomfort, or fever as directed by your caregiver.  Do not take aspirin. It can cause or aggravate bleeding.  Do not drive or drink alcohol while taking pain medicine.  You may resume your usual diet.  Do not use tampons, douche, or have sexual  intercourse for 2 weeks, or as advised by your caregiver.  Rest and sleep for the first 24 to 48 hours.  Take your temperature twice a day for 4 to 5 days. Write it down. Give these temperatures to your caregiver if they are abnormal (above 98.6 F or 37.0 C).  Take medicines your caregiver has ordered as directed.  Follow your caregiver's advice regarding diet, exercise, lifting, driving, and general activities.  Take showers instead of baths for 2 weeks, or as recommended by your caregiver.  If you develop constipation:  Take a mild laxative with the advice of your caregiver.  Eat bran foods.  Drink enough water and fluids to keep your urine clear or pale yellow.  Try to have someone with you or available to you for the first 24 to 48 hours, especially if you had a general anesthetic.  Make sure you and your family understand everything about your operation and recovery.  Follow your caregiver's advice regarding follow-up appointments and Pap smears. SEEK MEDICAL CARE IF:   You feel dizzy or lightheaded.  You feel sick to your stomach (nauseous).  You develop abnormal vaginal discharge.  You develop a rash.  You have an abnormal reaction or allergy to your medicine.  You need stronger pain medicine. SEEK IMMEDIATE MEDICAL CARE  IF:   Bleeding is heavier than a normal menstrual period or you have blood clots.  You have an oral temperature above 102 F (38.9 C), not controlled by medicine.  You have increasing cramps or pains not relieved with medicine.  You develop belly (abdominal) pain that does not seem to be related to the same area of earlier cramping and pain.  You pass out.  You develop pain in the tops of your shoulders (shoulder strap areas).  You develop shortness of breath. MAKE SURE YOU:   Understand these instructions.  Will watch your condition.  Will get help right away if you are not doing well or get worse. Document Released: 02/19/2001  Document Revised: 02/05/2012 Document Reviewed: 06/14/2009 [facility] Patient Information 2013 [place], [place].

## 2013-03-03 ENCOUNTER — Encounter (HOSPITAL_COMMUNITY): Payer: Self-pay | Admitting: *Deleted

## 2013-03-12 ENCOUNTER — Ambulatory Visit (INDEPENDENT_AMBULATORY_CARE_PROVIDER_SITE_OTHER): Payer: Medicaid Other | Admitting: Obstetrics & Gynecology

## 2013-03-12 ENCOUNTER — Encounter: Payer: Self-pay | Admitting: Obstetrics & Gynecology

## 2013-03-12 VITALS — BP 113/71 | HR 79 | Temp 98.7°F | Ht 63.0 in | Wt 208.6 lb

## 2013-03-12 DIAGNOSIS — N912 Amenorrhea, unspecified: Secondary | ICD-10-CM

## 2013-03-12 DIAGNOSIS — N911 Secondary amenorrhea: Secondary | ICD-10-CM

## 2013-03-12 NOTE — Progress Notes (Signed)
Patient ID: Connie Jenkins, female   DOB: 07-28-1981, 32 y.o.   MRN: [id number] Patient desires surgical management with diagnostic hysteroscopy.  The risks of surgery were discussed in detail with the patient including but not limited to: bleeding which may require transfusion or reoperation; infection which may require prolonged hospitalization or re-hospitalization and antibiotic therapy; injury to bowel, bladder, ureters and major vessels or other surrounding organs; need for additional procedures including laparotomy; thromboembolic phenomenon, incisional problems and other postoperative or anesthesia complications.  Patient was told that this is a diagnostic procedure and that it is unlikely to alter her symptoms; the postoperative expectations were also discussed in detail. All questions were answered.  Routine preoperative instructions of having nothing to eat or drink after midnight on the day prior to surgery and also coming to the hospital 1 1/2 hours prior to her time of surgery were also emphasized.  Printed patient education handouts about the procedure were previously given to the patient to review at home.

## 2013-03-12 NOTE — Patient Instructions (Addendum)
Secondary Amenorrhea   Secondary amenorrhea is the stopping of menstrual flow for 3 to 6 months in a female who has previously had periods. There are many possible causes. Most of these causes are not serious. Usually treating the underlying problem causing the loss of menses will return your periods to normal.  CAUSES   Some common and uncommon causes of not menstruating include:  · Malnutrition.  · Low blood sugar (hypoglycemia).  · Polycystic ovarian disease.  · Stress or fear.  · Breastfeeding.  · Hormone imbalance.  · Ovarian failure.  · Medications.  · Extreme obesity.  · Cystic fibrosis.  · Low body weight or drastic weight reduction from any cause.  · Early menopause.  · Removal of ovaries or uterus.  · Contraceptives.  · Illness.  · Long term (chronic) illnesses.  · Cushing's syndrome.  · Thyroid problems.  · Birth control pills, patches, or vaginal rings for birth control.  DIAGNOSIS   This diagnosis is made by your caregiver taking a medical history and doing a physical exam. Pregnancy must be ruled out. Often times, numerous blood tests of different hormones in the body may be measured. Urine testing may be done. Specialized x-rays may have to be done as well as measuring the body mass index (BMI).  TREATMENT   Treatment depends on the cause of the amenorrhea. If an eating disorder is present, this can be treated with an adequate diet and therapy. Chronic illnesses may improve with treatment of the illness. Overall, the outlook is good. The amenorrhea may be corrected with medications, lifestyle changes, or surgery. If the amenorrhea cannot be corrected, it is sometimes possible to create a false menstruation with medications.  Document Released: 12/25/2006 Document Revised: 02/05/2012 Document Reviewed: 11/01/2007  [employer]® Patient Information ©2013 [employer].

## 2013-03-17 ENCOUNTER — Encounter (HOSPITAL_COMMUNITY): Payer: Self-pay | Admitting: Pharmacy Technician

## 2013-03-24 ENCOUNTER — Ambulatory Visit (HOSPITAL_COMMUNITY)
Admission: RE | Admit: 2013-03-24 | Discharge: 2013-03-24 | Disposition: A | Discharge status: Home / Self Care | Payer: Medicaid Other | Source: Ambulatory Visit | Attending: Obstetrics & Gynecology | Admitting: Obstetrics & Gynecology

## 2013-03-24 ENCOUNTER — Encounter (HOSPITAL_COMMUNITY): Payer: Self-pay | Admitting: *Deleted

## 2013-03-24 ENCOUNTER — Ambulatory Visit (HOSPITAL_COMMUNITY): Payer: Medicaid Other | Admitting: Anesthesiology

## 2013-03-24 ENCOUNTER — Encounter (HOSPITAL_COMMUNITY): Payer: Self-pay | Admitting: Anesthesiology

## 2013-03-24 ENCOUNTER — Inpatient Hospital Stay (HOSPITAL_COMMUNITY)
Admission: AD | Admit: 2013-03-24 | Discharge: 2013-03-24 | Disposition: A | Discharge status: Home / Self Care | Payer: Medicaid Other | Source: Ambulatory Visit | Attending: Obstetrics and Gynecology | Admitting: Obstetrics and Gynecology

## 2013-03-24 ENCOUNTER — Encounter (HOSPITAL_COMMUNITY)
Admission: RE | Disposition: A | Discharge status: Home / Self Care | Payer: Self-pay | Source: Ambulatory Visit | Attending: Obstetrics & Gynecology

## 2013-03-24 DIAGNOSIS — G8918 Other acute postprocedural pain: Secondary | ICD-10-CM

## 2013-03-24 DIAGNOSIS — R5082 Postprocedural fever: Secondary | ICD-10-CM | POA: Insufficient documentation

## 2013-03-24 DIAGNOSIS — N912 Amenorrhea, unspecified: Secondary | ICD-10-CM | POA: Insufficient documentation

## 2013-03-24 DIAGNOSIS — N911 Secondary amenorrhea: Secondary | ICD-10-CM

## 2013-03-24 HISTORY — PX: HYSTEROSCOPY: SHX211

## 2013-03-24 HISTORY — DX: Anxiety disorder, unspecified: F41.9

## 2013-03-24 HISTORY — DX: Other seasonal allergic rhinitis: J30.2

## 2013-03-24 HISTORY — DX: Personal history of other diseases of the respiratory system: Z87.09

## 2013-03-24 LAB — CBC
HCT: 41.4 % (ref 36.0–46.0)
Hemoglobin: 13.2 g/dL (ref 12.0–15.0)
MCH: 27.3 pg (ref 26.0–34.0)
MCHC: 32.5 g/dL (ref 30.0–36.0)
MCHC: 32.6 g/dL (ref 30.0–36.0)
MCV: 83.5 fL (ref 78.0–100.0)
Platelets: 281 10*3/uL (ref 150–400)
RDW: 13.3 % (ref 11.5–15.5)
RDW: 13.1 % (ref 11.5–15.5)
WBC: 9.1 10*3/uL (ref 4.0–10.5)

## 2013-03-24 LAB — COMPREHENSIVE METABOLIC PANEL
ALT: 26 U/L (ref 0–35)
Albumin: 3.9 g/dL (ref 3.5–5.2)
Alkaline Phosphatase: 68 U/L (ref 39–117)
Potassium: 4.2 mEq/L (ref 3.5–5.1)
Sodium: 136 mEq/L (ref 135–145)
Total Protein: 7 g/dL (ref 6.0–8.3)

## 2013-03-24 LAB — PREGNANCY, URINE: Preg Test, Ur: NEGATIVE

## 2013-03-24 SURGERY — HYSTEROSCOPY
Anesthesia: General | Site: Vagina | Wound class: Clean Contaminated

## 2013-03-24 MED ORDER — BUPIVACAINE HCL (PF) 0.5 % IJ SOLN
INTRAMUSCULAR | Status: AC
  Filled 2013-03-24: qty 30

## 2013-03-24 MED ORDER — LACTATED RINGERS IV SOLN
INTRAVENOUS | Status: DC
  Administered 2013-03-24 (×2): via INTRAVENOUS

## 2013-03-24 MED ORDER — KETOROLAC TROMETHAMINE 30 MG/ML IJ SOLN: 15.0000 mg | Freq: Once | INTRAMUSCULAR | Status: DC | PRN

## 2013-03-24 MED ORDER — DEXAMETHASONE SODIUM PHOSPHATE 4 MG/ML IJ SOLN
INTRAMUSCULAR | Status: DC | PRN
  Administered 2013-03-24: 10 mg via INTRAVENOUS

## 2013-03-24 MED ORDER — ALBUTEROL SULFATE HFA 108 (90 BASE) MCG/ACT IN AERS: 2.0000 | INHALATION_SPRAY | Freq: Once | RESPIRATORY_TRACT | Status: AC

## 2013-03-24 MED ORDER — ONDANSETRON HCL 4 MG/2ML IJ SOLN
INTRAMUSCULAR | Status: DC | PRN
  Administered 2013-03-24: 4 mg via INTRAVENOUS

## 2013-03-24 MED ORDER — PROPOFOL 10 MG/ML IV EMUL
INTRAVENOUS | Status: DC | PRN
  Administered 2013-03-24: 200 mg via INTRAVENOUS

## 2013-03-24 MED ORDER — FENTANYL CITRATE 0.05 MG/ML IJ SOLN
INTRAMUSCULAR | Status: AC
  Filled 2013-03-24: qty 2

## 2013-03-24 MED ORDER — LACTATED RINGERS IR SOLN
Status: DC | PRN
  Administered 2013-03-24: 3000 mL

## 2013-03-24 MED ORDER — MIDAZOLAM HCL 2 MG/2ML IJ SOLN
INTRAMUSCULAR | Status: AC
  Filled 2013-03-24: qty 2

## 2013-03-24 MED ORDER — LIDOCAINE HCL (CARDIAC) 20 MG/ML IV SOLN
INTRAVENOUS | Status: DC | PRN
  Administered 2013-03-24: 100 mg via INTRAVENOUS

## 2013-03-24 MED ORDER — FENTANYL CITRATE 0.05 MG/ML IJ SOLN
INTRAMUSCULAR | Status: DC | PRN
  Administered 2013-03-24 (×2): 50 ug via INTRAVENOUS

## 2013-03-24 MED ORDER — FENTANYL CITRATE 0.05 MG/ML IJ SOLN
INTRAMUSCULAR | Status: AC
  Administered 2013-03-24: 50 ug via INTRAVENOUS
  Filled 2013-03-24: qty 2

## 2013-03-24 MED ORDER — FENTANYL CITRATE 0.05 MG/ML IJ SOLN
25.0000 ug | INTRAMUSCULAR | Status: DC | PRN
  Administered 2013-03-24: 50 ug via INTRAVENOUS

## 2013-03-24 MED ORDER — MEPERIDINE HCL 25 MG/ML IJ SOLN: 6.2500 mg | INTRAMUSCULAR | Status: DC | PRN

## 2013-03-24 MED ORDER — MIDAZOLAM HCL 5 MG/5ML IJ SOLN
INTRAMUSCULAR | Status: DC | PRN
  Administered 2013-03-24: 2 mg via INTRAVENOUS

## 2013-03-24 MED ORDER — PROMETHAZINE HCL 25 MG/ML IJ SOLN: 6.2500 mg | INTRAMUSCULAR | Status: DC | PRN

## 2013-03-24 MED ORDER — ALBUTEROL SULFATE HFA 108 (90 BASE) MCG/ACT IN AERS
INHALATION_SPRAY | RESPIRATORY_TRACT | Status: AC
  Administered 2013-03-24: 2 via RESPIRATORY_TRACT
  Filled 2013-03-24: qty 6.7

## 2013-03-24 MED ORDER — ONDANSETRON HCL 4 MG/2ML IJ SOLN
INTRAMUSCULAR | Status: AC
  Filled 2013-03-24: qty 2

## 2013-03-24 MED ORDER — BUPIVACAINE HCL (PF) 0.5 % IJ SOLN
INTRAMUSCULAR | Status: DC | PRN
  Administered 2013-03-24: 10 mL

## 2013-03-24 MED ORDER — VALACYCLOVIR HCL 500 MG PO TABS: 500.0000 mg | ORAL_TABLET | Freq: Three times a day (TID) | ORAL | Status: DC

## 2013-03-24 MED ORDER — MIDAZOLAM HCL 2 MG/2ML IJ SOLN: 0.5000 mg | Freq: Once | INTRAMUSCULAR | Status: DC | PRN

## 2013-03-24 MED ORDER — LACTATED RINGERS IV SOLN: INTRAVENOUS | Status: DC

## 2013-03-24 SURGICAL SUPPLY — 16 items
CATH ROBINSON RED A/P 16FR (CATHETERS) ×1 IMPLANT
CLOTH BEACON ORANGE TIMEOUT ST (SAFETY) ×2 IMPLANT
CONTAINER PREFILL 10% NBF 60ML (FORM) ×1 IMPLANT
DRESSING TELFA 8X3 (GAUZE/BANDAGES/DRESSINGS) ×2 IMPLANT
GLOVE BIO SURGEON STRL SZ7 (GLOVE) ×1 IMPLANT
GLOVE BIOGEL PI IND STRL 7.0 (GLOVE) ×1 IMPLANT
GLOVE BIOGEL PI INDICATOR 7.0 (GLOVE)
GLOVE SKINSENSE NS SZ6.5 (GLOVE) ×1
GLOVE SKINSENSE NS SZ7.0 (GLOVE) ×1
GLOVE SKINSENSE STRL SZ6.5 (GLOVE) IMPLANT
GLOVE SKINSENSE STRL SZ7.0 (GLOVE) IMPLANT
GOWN STRL REIN XL XLG (GOWN DISPOSABLE) ×4 IMPLANT
PACK HYSTEROSCOPY LF (CUSTOM PROCEDURE TRAY) ×2 IMPLANT
PAD OB MATERNITY 4.3X12.25 (PERSONAL CARE ITEMS) ×2 IMPLANT
TOWEL OR 17X24 6PK STRL BLUE (TOWEL DISPOSABLE) ×4 IMPLANT
WATER STERILE IRR 1000ML POUR (IV SOLUTION) ×2 IMPLANT

## 2013-03-24 NOTE — Op Note (Signed)
03/24/2013  12:03 PM  PATIENT:  Connie Jenkins  32 y.o. female  PRE-OPERATIVE DIAGNOSIS:  cpt [phone] - amenorrhea  POST-OPERATIVE DIAGNOSIS:  cpt# [phone] amenorrhea  PROCEDURE:  Procedure(s) with comments: HYSTEROSCOPY / Dilitation and curettage/endometrial curettings (N/A) - Diagnostic hysteroscopy  SURGEON:  Surgeon(s) and Role:    * Willodean Rosenthal, MD - Primary  ANESTHESIA:   general  EBL:  Total I/O In: 1000 [I.V.:1000] Out: 30 [Urine:30]  BLOOD ADMINISTERED:none  DRAINS: none   LOCAL MEDICATIONS USED:  MARCAINE     SPECIMEN:  Source of Specimen:  enodometrial currettings   DISPOSITION OF SPECIMEN:  PATHOLOGY  COUNTS:  YES  TOURNIQUET:  * No tourniquets in log *  DICTATION: .Note written in EPIC  PLAN OF CARE: Discharge to home after PACU  PATIENT DISPOSITION:  PACU - hemodynamically stable.   Delay start of Pharmacological VTE agent (>24hrs) due to surgical blood loss or risk of bleeding: yes  INDICATIONS: 32 y.o. [id number]  here for scheduled surgery for hysteroscopy and dilation and currettage.   Risks of surgery were discussed with the patient including but not limited to: bleeding which may require transfusion; infection which may require antibiotics; injury to uterus or surrounding organs; intrauterine scarring which may impair future fertility; need for additional procedures including laparotomy or laparoscopy; and other postoperative/anesthesia complications. Written informed consent was obtained.    FINDINGS:  A 8 week size uterus.  Diffuse proliferative endometrium.  Normal ostia bilaterally.  PROCEDURE DETAILS:  The patient was taken to the operating room where general anesthesia was administered with LMA and was found to be adequate.  After an adequate timeout was performed, she was placed in the dorsal lithotomy position and examined; then prepped and draped in the sterile manner.   Her bladder was catheterized for an unmeasured amount of clear,  yellow urine. A speculum was then placed in the patient's vagina and a single tooth tenaculum was applied to the anterior lip of the cervix.   The cervix was sounded to 8cm and dilated manually with metal dilators to accommodate the 5 mm diagnostic hysteroscope.  Once the cervix was dilated, the hysteroscope was inserted under direct visualization using 1.5% glycine as a suspension medium.  The uterine cavity was carefully examined, both ostia were recognized, and diffusely proliferative endometrium was noted.   After further careful visualization of the uterine cavity, the hysteroscope was removed under direct visualization.  A sharp curettage was then performed to obtain a moderate amount of endometrial curettings.  The hysteroscope was reinserted and the cavity was found to be intact with no lesions or masses appreciated. The tenaculum was removed from the anterior lip of the cervix and the vaginal speculum was removed after noting good hemostasis.  The patient tolerated the procedure well and was taken to the recovery area awake, extubated and in stable condition.  The patient will be discharged to home as per PACU criteria.  Routine postoperative instructions given.  She was prescribed Percocet, Ibuprofen and Colace.  She will follow up in the clinic in 6 weeks for postoperative evaluation.

## 2013-03-24 NOTE — MAU Note (Signed)
Pt reports she had a hysteroscopy today. Went home felt hot took her temp and it was 107. Pt called and was told to come to the hospital. Pt came temp 97.8 on out=r thermometer. Pt had her electronic thermometer and it registered her temp at 105.4. Pt skin temp is normal HR 79. Dr. Malen Gauze at bedside cbc ordered and other labs canceled at this time. Pt stable. Home thermometer in error.

## 2013-03-24 NOTE — Anesthesia Preprocedure Evaluation (Signed)
Anesthesia Evaluation  Patient identified by MRN, date of birth, ID band Patient awake    Reviewed: Allergy & Precautions, H&P , Patient's Chart, lab work & pertinent test results, reviewed documented beta blocker date and time   History of Anesthesia Complications Negative for: history of anesthetic complications  Airway Mallampati: III TM Distance: >3 FB Neck ROM: full    Dental no notable dental hx.    Pulmonary neg pulmonary ROS, asthma ,  breath sounds clear to auscultation  Pulmonary exam normal       Cardiovascular Exercise Tolerance: Good negative cardio ROS  Rhythm:regular Rate:Normal     Neuro/Psych Anxiety negative neurological ROS  negative psych ROS   GI/Hepatic negative GI ROS, Neg liver ROS,   Endo/Other  negative endocrine ROS  Renal/GU negative Renal ROS     Musculoskeletal   Abdominal   Peds  Hematology negative hematology ROS (+)   Anesthesia Other Findings Febrile illness, acute 01/2011 Hospitalized for 6 days Herpes 2008      Seasonal allergies     Asthma   inhaler rarely used - twice year    Anxiety   no meds Hx of bronchitis 09/2012    Reproductive/Obstetrics negative OB ROS                           Anesthesia Physical Anesthesia Plan  ASA: II  Anesthesia Plan: General LMA   Post-op Pain Management:    Induction:   Airway Management Planned:   Additional Equipment:   Intra-op Plan:   Post-operative Plan:   Informed Consent: I have reviewed the patients History and Physical, chart, labs and discussed the procedure including the risks, benefits and alternatives for the proposed anesthesia with the patient or authorized representative who has indicated his/her understanding and acceptance.   Dental Advisory Given  Plan Discussed with: CRNA, Surgeon and Anesthesiologist  Anesthesia Plan Comments:         Anesthesia Quick Evaluation

## 2013-03-24 NOTE — Anesthesia Procedure Notes (Addendum)
Procedure Name: LMA Insertion Date/Time: 03/24/2013 11:34 AM Performed by: Graciela Husbands Pre-anesthesia Checklist: Patient identified, Emergency Drugs available, Suction available, Patient being monitored and Timeout performed Patient Re-evaluated:Patient Re-evaluated prior to inductionOxygen Delivery Method: Circle system utilized and Simple face mask Preoxygenation: Pre-oxygenation with 100% oxygen Intubation Type: IV induction LMA: LMA inserted LMA Size: 4.0 Number of attempts: 1 Placement Confirmation: positive ETCO2,  CO2 detector and breath sounds checked- equal and bilateral Dental Injury: Teeth and Oropharynx as per pre-operative assessment

## 2013-03-24 NOTE — Transfer of Care (Signed)
Immediate Anesthesia Transfer of Care Note  Patient: Connie Jenkins  Procedure(s) Performed: Procedure(s) with comments: HYSTEROSCOPY / Dilitation and curettage/endometrial curettings (N/A) - Diagnostic hysteroscopy  Patient Location: PACU  Anesthesia Type:General  Level of Consciousness: awake, alert  and oriented  Airway & Oxygen Therapy: Patient Spontanous Breathing and Patient connected to nasal cannula oxygen  Post-op Assessment: Report given to PACU RN and Post -op Vital signs reviewed and stable  Post vital signs: Reviewed and stable  Complications: No apparent anesthesia complications

## 2013-03-24 NOTE — Progress Notes (Signed)
Patient called up earlier this pm to report a temperature of 107.75F She was instructed to proceed directly to MAU. Suspicion for MH was high. She arrived in MAU approximately 2 1/2 hours after initial phone call. Her temperature on admission to MAU was 97.7 per the hospital thermometer. She had brought her digital thermometer from home and it registered 105.90F Clearly this was a case of a malfunctioning personal thermometer. CBC was normal. She was discharged home.

## 2013-03-24 NOTE — Addendum Note (Signed)
Addendum created 03/24/13 2256 by Tyrone Apple. Malen Gauze, MD   Modules edited: Clinical Notes   Clinical Notes:  File: [id number]

## 2013-03-24 NOTE — Anesthesia Postprocedure Evaluation (Signed)
  Anesthesia Post Note  Patient: Connie Jenkins  Procedure(s) Performed: Procedure(s) (LRB): HYSTEROSCOPY / Dilitation and curettage/endometrial curettings (N/A)  Anesthesia type: GA  Patient location: PACU  Post pain: Pain level controlled  Post assessment: Post-op Vital signs reviewed  Last Vitals:  Filed Vitals:   03/24/13 1300  BP: 126/74  Pulse: 70  Temp:   Resp: 24    Post vital signs: Reviewed  Level of consciousness: sedated  Complications: No apparent anesthesia complications

## 2013-03-24 NOTE — MAU Provider Note (Signed)
[name] is a 32 y.o. [id number] who presents to MAU today post-op day #1 after D&C with complaint of fever. The patient called in stating fever was 107.50F at home.   BP 139/69  Pulse 81  Temp(Src) 97.8 F (36.6 C) (Oral)  Resp 22  SpO2 98% GENERAL: Well-developed, well-nourished female in no acute distress.  HEENT: Normocephalic, atraumatic.   LUNGS: Effort normal HEART: Regular rate  SKIN: Warm, dry and without erythema PSYCH: Normal mood and affect  MDM Discussed with Dr. Malen Gauze and Dr. Jolayne Panther. Patient ok for discharge. Follow-up in the clinic as scheduled  A: Normal post op exam  P: Discharge home Care after procedure information given on AVS Patient may return to MAU as needed or if her condition were to change or worsen  Freddi Starr, PA-C 03/24/2013 11:08 PM

## 2013-03-24 NOTE — H&P (Signed)
  HPI Pt is a 32 yo G2P2002 with a 5 year h/o amenorrhea. Pt reports that she has had many evals and no answer for her amenorrhea. She reports molimenal sx every month at the same time her cycles are scheduled to come. She was on Depo Provera for 3 shots in 2008 and has not had bleeding since that time. She reports that she has been on OCP's and Provera in the past with no withdrawal bleed. She denies h/o uterine instrumentation after her section.    Past Medical History  Diagnosis Date  . Febrile illness, acute 01/2011    Hospitalized for 6 days  . Herpes 2008  . Seasonal allergies   . Asthma     inhaler rarely used - twice year  . Anxiety     no meds  . Hx of bronchitis 09/2012   Past Surgical History  Procedure Laterality Date  . Cesarean section  2000 & 2004    "1st one I dilated 1cm, then the 2nd one was a rpt  . Hernia repair    . Wisdom tooth extraction    . Tmj arthroplasty     No current facility-administered medications on file prior to encounter.   Current Outpatient Prescriptions on File Prior to Encounter  Medication Sig Dispense Refill  . albuterol (PROVENTIL HFA;VENTOLIN HFA) 108 (90 BASE) MCG/ACT inhaler Inhale 1-2 puffs into the lungs every 6 (six) hours as needed for wheezing.  1 Inhaler  0  . cetirizine (ZYRTEC) 10 MG tablet Take 10 mg by mouth daily.      . valACYclovir (VALTREX) 500 MG tablet Take 500 mg by mouth daily.       . [DISCONTINUED] DiphenhydrAMINE HCl (BENADRYL ALLERGY PO) Take 2 capsules by mouth daily as needed. Patient took this medication because of itching that occurred from eating fish.       Allergies  Allergen Reactions  . Flagyl (Metronidazole) Nausea And Vomiting  . Latex Itching  . Penicillins Hives and Other (See Comments)    Childhood allergy; reaction unknown   History reviewed. No pertinent family history. History   Social History  . Marital Status: Single    Spouse Name: N/A    Number of Children: N/A  . Years of Education:  N/A   Occupational History  . Not on file.   Social History Main Topics  . Smoking status: Never Smoker   . Smokeless tobacco: Never Used  . Alcohol Use: No  . Drug Use: No  . Sexually Active: Yes -- Female partner(s)    Birth Control/ Protection: Condom     Comment: last intercourse was 2 weeks   Other Topics Concern  . Not on file   Social History Narrative  . No narrative on file    Review of Systems  Objective:   Physical Exam  BP 114/67  Pulse 70  Temp(Src) 98.2 F (36.8 C) (Oral)  Resp 20  Ht 5\' 3"  (1.6 m)  Wt 205 lb (92.987 kg)  BMI 36.32 kg/m2  SpO2 100% Lungs: CV: Abd: GYN: not done   Assessment:   Secondary amenorrhea- reviewed chart and labs all were within normal limits. I discussed with her the option of an other Provera challenge which she declined. Proceeding with hysteroscopy to eval for Asherman's pr other structural causes of secondary amenorrhea.  Plan: proceed with hysteroscopy

## 2013-03-25 ENCOUNTER — Encounter (HOSPITAL_COMMUNITY): Payer: Self-pay | Admitting: Obstetrics & Gynecology

## 2013-03-25 NOTE — MAU Provider Note (Signed)
According to MAU staff, patient's thermometer takes inaccurate readings as she was afebrile in MAU but her home thermometer read 105 in MAU.  Attestation of Attending Supervision of Advanced Practitioner (CNM/NP): Evaluation and management procedures were performed by the Advanced Practitioner under my supervision and collaboration.  I have reviewed the Advanced Practitioner's note and chart, and I agree with the management and plan.  Johniya Durfee 03/25/2013 4:35 AM

## 2013-03-27 ENCOUNTER — Encounter: Payer: Self-pay | Admitting: *Deleted

## 2013-04-08 ENCOUNTER — Encounter (HOSPITAL_COMMUNITY): Payer: Self-pay | Admitting: *Deleted

## 2013-04-08 ENCOUNTER — Inpatient Hospital Stay (HOSPITAL_COMMUNITY)
Admission: AD | Admit: 2013-04-08 | Discharge: 2013-04-08 | Disposition: A | Discharge status: Home / Self Care | Payer: Medicaid Other | Source: Ambulatory Visit | Attending: Obstetrics & Gynecology | Admitting: Obstetrics & Gynecology

## 2013-04-08 DIAGNOSIS — L03317 Cellulitis of buttock: Secondary | ICD-10-CM

## 2013-04-08 DIAGNOSIS — L723 Sebaceous cyst: Secondary | ICD-10-CM

## 2013-04-08 DIAGNOSIS — D235 Other benign neoplasm of skin of trunk: Secondary | ICD-10-CM

## 2013-04-08 DIAGNOSIS — L0231 Cutaneous abscess of buttock: Secondary | ICD-10-CM | POA: Insufficient documentation

## 2013-04-08 DIAGNOSIS — L720 Epidermal cyst: Secondary | ICD-10-CM

## 2013-04-08 MED ORDER — OXYCODONE-ACETAMINOPHEN 5-325 MG PO TABS: 2.0000 | ORAL_TABLET | ORAL | Status: DC | PRN

## 2013-04-08 MED ORDER — CEPHALEXIN 500 MG PO CAPS: 500.0000 mg | ORAL_CAPSULE | Freq: Four times a day (QID) | ORAL | Status: DC

## 2013-04-08 MED ORDER — HYDROMORPHONE HCL PF 1 MG/ML IJ SOLN
2.0000 mg | Freq: Once | INTRAMUSCULAR | Status: AC
  Administered 2013-04-08: 2 mg via INTRAMUSCULAR
  Filled 2013-04-08: qty 2

## 2013-04-08 MED ORDER — KETOROLAC TROMETHAMINE 60 MG/2ML IM SOLN
60.0000 mg | Freq: Once | INTRAMUSCULAR | Status: AC
  Administered 2013-04-08: 60 mg via INTRAMUSCULAR
  Filled 2013-04-08: qty 2

## 2013-04-08 NOTE — MAU Note (Signed)
Boil at the crack of her...; first noted it hurting yesterday. [facility] did hysteroscopy on April 28.  Been feeling flu like: fever and chills, eyes watery, throat sore- ?? Allergies.

## 2013-04-08 NOTE — Progress Notes (Addendum)
Upon exam, R side of buttock near center has small raised area. Out from that has appearance of cellulitis. Firm and tender to touch. Foul odor noted from area, but no drainage. Took ibuprofen 600mg  around 0730. Did not relieve pain.

## 2013-04-08 NOTE — MAU Provider Note (Signed)
History     CSN: [id number]  Arrival date and time: 04/08/13 1215   None     Chief Complaint  Patient presents with  . Abscess   HPI Connie Jenkins is a 32 y.o. female who presents today complaining of a painful bump that is at the top and between her buttocks, involving the right.  She states that she first noticed this yesterday morning and that it has gotten bigger since then.  She states that she has 9/10 pain in the area, but a 10/10 when palpated.  She characterizes the pain as waxing and waining and is sharp and throbbing, but also dull. She states that this is the first time this has occurred to her; she has taken ibuprofen for the pain which helped a little and soaked herself in a sitz bath which helped with the throbbing pain while she was in the water.  Incidentally, she is complaining of a fishy vaginal odor that she has been seen in clinic for and has a follow up in the morning.     OB History   Grav Para Term Preterm Abortions TAB SAB Ect Mult Living   3 2 1 1 1  1   2       Past Medical History  Diagnosis Date  . Febrile illness, acute 01/2011    Hospitalized for 6 days  . Herpes 2008  . Seasonal allergies   . Asthma     inhaler rarely used - twice year  . Anxiety     no meds  . Hx of bronchitis 09/2012    Past Surgical History  Procedure Laterality Date  . Cesarean section  2000 & 2004    "1st one I dilated 1cm, then the 2nd one was a rpt  . Hernia repair    . Wisdom tooth extraction    . Tmj arthroplasty    . Hysteroscopy N/A 03/24/2013    Procedure: HYSTEROSCOPY / Dilitation and curettage/endometrial curettings;  Surgeon: Willodean Rosenthal, MD;  Location: [facility];  Service: Gynecology;  Laterality: N/A;  Diagnostic hysteroscopy    History reviewed. No pertinent family history.  History  Substance Use Topics  . Smoking status: Never Smoker   . Smokeless tobacco: Never Used  . Alcohol Use: No    Allergies:  Allergies  Allergen Reactions  .  Flagyl (Metronidazole) Nausea And Vomiting  . Latex Itching  . Penicillins Hives and Other (See Comments)    Childhood allergy; reaction unknown    Prescriptions prior to admission  Medication Sig Dispense Refill  . ibuprofen (ADVIL,MOTRIN) 200 MG tablet Take 200 mg by mouth every 6 (six) hours as needed for pain.      Marland Kitchen albuterol (PROVENTIL HFA;VENTOLIN HFA) 108 (90 BASE) MCG/ACT inhaler Inhale 1-2 puffs into the lungs every 6 (six) hours as needed for wheezing.  1 Inhaler  0  . valACYclovir (VALTREX) 500 MG tablet Take 1 tablet (500 mg total) by mouth 3 (three) times daily.  60 tablet  1    Review of Systems  Constitutional: Positive for chills. Negative for fever and malaise/fatigue.  Gastrointestinal: Negative for nausea, vomiting, abdominal pain, diarrhea, constipation, blood in stool and melena.  Genitourinary: Negative for dysuria, urgency and frequency.  Skin: Negative for itching and rash.       Area involving the right buttock as described above.     Physical Exam   Blood pressure 118/73, pulse 95, temperature 99.1 F (37.3 C), temperature source Oral, resp. rate 20.  Physical Exam  Constitutional: She appears well-developed and well-nourished.  Skin: Skin is warm, dry and intact.     Patient has a 5cmx5cm area on the superior aspect of the right buttock that is warm to palpation.  This is exquisitely tender to touch and appears to be fluctuant.  There is a central raised area measuring 1cmx1cm that is more erythematous.  There is no head to the lesion and there is no discharge from the lesion.  (Area highlighted in red on graphic).      MAU Course  Procedures Patient was given dilaudid IM for pain with satisfactory repsonse  PROCEDURE: I&D   A timeout protocol was performed prior to initiating the procedure and the patient's consent was viewed to be signed. The area was prepared and draped in the usual, sterile manner. The site was anesthetized with 1% lidocaine. A  linear incision was made into the central raised area and purulent material expressed from the abcess. Bleeding was minimal. The wound was covered with a peripad.    Followup: The patient tolerated the procedure well without complications. Standard post-procedure care was explained and return precautions were given.   Pt got great relief from I&D  Assessment and Plan  Inclusion cyst ?pilonidial cyst with ?cellulitis- I&D and Keflex 500mg  QID for 7days F/u in GYN clinic tomorrow as scheduled-consider surgical consult;.  She was further instructed to follow up sooner should she develop signs of a worsening infection such as worsening pain, redness, or fever.   Prescription for Keflex QID for 7 days and Percocet Warm soaks I saw pt with Anna Genre and agree with evaluation and management Anna Genre 04/08/2013, 1:36 PM

## 2013-04-09 ENCOUNTER — Encounter: Payer: Self-pay | Admitting: Obstetrics & Gynecology

## 2013-04-09 ENCOUNTER — Ambulatory Visit (INDEPENDENT_AMBULATORY_CARE_PROVIDER_SITE_OTHER): Payer: Medicaid Other | Admitting: Obstetrics & Gynecology

## 2013-04-09 VITALS — BP 119/77 | HR 98 | Temp 97.3°F | Ht 63.0 in | Wt 210.0 lb

## 2013-04-09 DIAGNOSIS — A499 Bacterial infection, unspecified: Secondary | ICD-10-CM

## 2013-04-09 DIAGNOSIS — B9689 Other specified bacterial agents as the cause of diseases classified elsewhere: Secondary | ICD-10-CM

## 2013-04-09 DIAGNOSIS — N912 Amenorrhea, unspecified: Secondary | ICD-10-CM

## 2013-04-09 DIAGNOSIS — N760 Acute vaginitis: Secondary | ICD-10-CM

## 2013-04-09 MED ORDER — METRONIDAZOLE 500 MG PO TABS: 500.0000 mg | ORAL_TABLET | Freq: Two times a day (BID) | ORAL | Status: AC

## 2013-04-09 MED ORDER — ESTROGENS CONJUGATED 1.25 MG PO TABS: 1.2500 mg | ORAL_TABLET | Freq: Every day | ORAL | Status: DC

## 2013-04-09 NOTE — Progress Notes (Signed)
Subjective:     Patient ID: Connie Jenkins, female   DOB: [birth year], 32 y.o.   MRN: [id number]  HPI Pt with no complaints after her Endometrial ablation. She had a pilonidal cyst drained last night.  Now in significant pain because she did not take her meds because she did not want to be sleepy during her GYN visit.  She had some bleeding after the D&C but, not like a normal period.   Review of Systems     Objective:   Physical Exam BP 119/77  Pulse 98  Temp(Src) 97.3 F (36.3 C) (Oral)  Ht 5\' 3"  (1.6 m)  Wt 210 lb (95.255 kg)  BMI 37.21 kg/m2 Pt appears uncomfortable  Buttocks:  There is purulent drainage ~1TBSP coming from area of I&D.  Non fluctuant. SL tender     03/24/2013 Diagnosis Endometrium, curettage - BLOOD WITH SCANTY INACTIVE ENDOMETRIUM AND BENIGN ENDOCERVIX. NO HYPERPLASIA OR CARCINOMA.     Assessment:     Amenorrhea- inactive endometrium.  Will attempt to build up with EES BV Pilonidal cyst- s/p drainage in ER last pm     Plan:     Premarin 1.25mg  1 po q day x 2 months Metronidazole 500mg  bid x 7 days F/u in 3 months or sooner prn

## 2013-04-09 NOTE — Patient Instructions (Signed)
Pilonidal Cyst Care After A pilonidal cyst occurs when hairs get trapped (ingrown) beneath the skin in the crease between the buttocks over your sacrum (the bone under that crease). Pilonidal cysts are most common in young men with a lot of body hair. When the cyst breaks(ruptured) or leaks, fluid from the cyst may cause burning and itching. If the cyst becomes infected, it causes a painful swelling filled with pus (abscess). The pus and trapped hairs need to be removed (often by lancing) so that the infection can heal. The word pilonidal means hair nest. HOME CARE INSTRUCTIONS If the pilonidal sinus was NOT DRAINING OR LANCED:  Keep the area clean and dry. Bathe or shower daily. Wash the area well with a germ-killing soap. Hot tub baths may help prevent infection. Dry the area well with a towel.  Avoid tight clothing in order to keep area as moisture-free as possible.  Keep area between buttocks as free from hair as possible. A depilatory may be used.  Take antibiotics as directed.  Only take over-the-counter or prescription medicines for pain, discomfort, or fever as directed by your caregiver. If the cyst WAS INFECTED AND NEEDED TO BE DRAINED:  Your caregiver may have packed the wound with gauze to keep the wound open. This allows the wound to heal from the inside outward and continue to drain.  Return as directed for a wound check.  If you take tub baths or showers, repack the wound with gauze as directed following. Sponge baths are a good alternative. Sitz baths may be used three to four times a day or as directed.  If an antibiotic was ordered to fight the infection, take as directed.  Only take over-the-counter or prescription medicines for pain, discomfort, or fever as directed by your caregiver.  If a drain was in place and removed, use sitz baths for 20 minutes 4 times per day. Clean the wound gently with mild unscented soap, pat dry, and then apply a dry dressing as  directed. If you had surgery and IT WAS MARSUPIALIZED (LEFT OPEN):  Your wound was packed with gauze to keep the wound open. This allows the wound to heal from the inside outwards and continue draining. The changing of the dressing regularly also helps keep the wound clean.  Return as directed for a wound check.  If you take tub baths or showers, repack the wound with gauze as directed following. Sponge baths are a good alternative. Sitz baths can also be used. This may be done three to four times a day or as directed.  If an antibiotic was ordered to fight the infection, take as directed.  Only take over-the-counter or prescription medicines for pain, discomfort, or fever as directed by your caregiver.  If you had surgery and the wound was closed you may care for it as directed. This generally includes keeping it dry and clean and dressing it as directed. SEEK MEDICAL CARE IF:   You have increased pain, swelling, redness, drainage, or bleeding from the area.  You have a fever.  You have muscles aches, dizziness, or a general ill feeling. Document Released: 12/14/2006 Document Revised: 02/05/2012 Document Reviewed: 02/28/2007 [facility] Patient Information 2013 [place], [place]. Pilonidal Cyst A pilonidal cyst occurs when hairs get trapped (ingrown) beneath the skin in the crease between the buttocks over your sacrum (the bone under that crease). Pilonidal cysts are most common in young men with a lot of body hair. When the cyst is ruptured (breaks) or leaking, fluid  from the cyst may cause burning and itching. If the cyst becomes infected, it causes a painful swelling filled with pus (abscess). The pus and trapped hairs need to be removed (often by lancing) so that the infection can heal. However, recurrence is common and an operation may be needed to remove the cyst. HOME CARE INSTRUCTIONS   If the cyst was NOT INFECTED:  Keep the area clean and dry. Bathe or shower daily. Wash the area  well with a germ-killing soap. Warm tub baths may help prevent infection and help with drainage. Dry the area well with a towel.  Avoid tight clothing to keep area as moisture free as possible.  Keep area between buttocks as free of hair as possible. A depilatory may be used.  If the cyst WAS INFECTED and needed to be drained:  Your caregiver packed the wound with gauze to keep the wound open. This allows the wound to heal from the inside outwards and continue draining.  Return for a wound check in 1 day or as suggested.  If you take tub baths or showers, repack the wound with gauze following them. Sponge baths (at the sink) are a good alternative.  If an antibiotic was ordered to fight the infection, take as directed.  Only take over-the-counter or prescription medicines for pain, discomfort, or fever as directed by your caregiver.  After the drain is removed, use sitz baths for 20 minutes 4 times per day. Clean the wound gently with mild unscented soap, pat dry, and then apply a dry dressing. SEEK MEDICAL CARE IF:   You have increased pain, swelling, redness, drainage, or bleeding from the area.  You have a fever.  You have muscles aches, dizziness, or a general ill feeling. Document Released: 11/10/2000 Document Revised: 02/05/2012 Document Reviewed: 01/08/2009 [facility] Patient Information 2013 [place], [place].

## 2013-04-10 ENCOUNTER — Telehealth: Payer: Self-pay

## 2013-04-10 MED ORDER — CLINDAMYCIN HCL 300 MG PO CAPS: 300.0000 mg | ORAL_CAPSULE | Freq: Two times a day (BID) | ORAL | Status: DC

## 2013-04-10 NOTE — Telephone Encounter (Signed)
Pt called and stated that she allergic to flagyl can someone please call in something else.  Called pt and pt informed me that she wanted clindamycin.  Per Dr. Erin Fulling pt can clindamycin 300mg  po bid for 7 days.  Pt stated understanding.

## 2013-07-22 ENCOUNTER — Other Ambulatory Visit (HOSPITAL_COMMUNITY)
Admission: RE | Admit: 2013-07-22 | Discharge: 2013-07-22 | Disposition: A | Discharge status: Home / Self Care | Payer: Medicaid Other | Source: Ambulatory Visit | Attending: Family Medicine | Admitting: Family Medicine

## 2013-07-22 ENCOUNTER — Encounter (HOSPITAL_COMMUNITY): Payer: Self-pay | Admitting: Emergency Medicine

## 2013-07-22 ENCOUNTER — Emergency Department (HOSPITAL_COMMUNITY)
Admission: EM | Admit: 2013-07-22 | Discharge: 2013-07-22 | Disposition: A | Discharge status: Home / Self Care | Payer: Medicaid Other | Source: Home / Self Care | Attending: Family Medicine | Admitting: Family Medicine

## 2013-07-22 DIAGNOSIS — N760 Acute vaginitis: Secondary | ICD-10-CM | POA: Insufficient documentation

## 2013-07-22 DIAGNOSIS — Z113 Encounter for screening for infections with a predominantly sexual mode of transmission: Secondary | ICD-10-CM | POA: Insufficient documentation

## 2013-07-22 DIAGNOSIS — R300 Dysuria: Secondary | ICD-10-CM

## 2013-07-22 LAB — POCT URINALYSIS DIP (DEVICE)
Ketones, ur: NEGATIVE mg/dL
Leukocytes, UA: NEGATIVE
Protein, ur: NEGATIVE mg/dL
pH: 6 (ref 5.0–8.0)

## 2013-07-22 MED ORDER — CEPHALEXIN 500 MG PO CAPS: 500.0000 mg | ORAL_CAPSULE | Freq: Two times a day (BID) | ORAL | Status: DC

## 2013-07-22 MED ORDER — PHENAZOPYRIDINE HCL 200 MG PO TABS: 200.0000 mg | ORAL_TABLET | Freq: Three times a day (TID) | ORAL | Status: DC

## 2013-07-22 NOTE — ED Provider Notes (Signed)
CSN: [id number]     Arrival date & time 07/22/13  1758 History   First MD Initiated Contact with Patient 07/22/13 1833     Chief Complaint  Patient presents with  . 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 Here complaining of burning on urination and suprapubic pressure and urinary frequency for 2 days. Patient just started with her menstrual period today. She also wants to be checked for STDs as she admits of having unprotected sex with multiple partners. Denies vaginal discharge prior the beginning of her menstrual period. Denies fever or chills. No back pain. No headache. No nausea or vomiting. Patient had a urine culture in January this year with >100,000 colonies of Escherichia coli.   Past Medical History  Diagnosis Date  . Febrile illness, acute 01/2011    Hospitalized for 6 days  . Herpes 2008  . Seasonal allergies   . Asthma     inhaler rarely used - twice year  . Anxiety     no meds  . Hx of bronchitis 09/2012   Past Surgical History  Procedure Laterality Date  . Cesarean section  2000 & 2004    "1st one I dilated 1cm, then the 2nd one was a rpt  . Hernia repair    . Wisdom tooth extraction    . Tmj arthroplasty    . Hysteroscopy N/A 03/24/2013    Procedure: HYSTEROSCOPY / Dilitation and curettage/endometrial curettings;  Surgeon: Willodean Rosenthal, MD;  Location: [facility];  Service: Gynecology;  Laterality: N/A;  Diagnostic hysteroscopy   History reviewed. No pertinent family history. History  Substance Use Topics  . Smoking status: Never Smoker   . Smokeless tobacco: Never Used  . Alcohol Use: No   OB History   Grav Para Term Preterm Abortions TAB SAB Ect Mult Living   3 2 1 1 1  1   2      Review of Systems  Constitutional: Negative for fever, chills, diaphoresis and appetite change.   Gastrointestinal: Negative for nausea, vomiting and abdominal pain.  Genitourinary: Positive for dysuria, frequency and vaginal bleeding. Negative for flank pain and pelvic pain.  Musculoskeletal: Negative for back pain.  Neurological: Negative for headaches.    Allergies  Flagyl; Latex; and Penicillins  Home Medications   Current Outpatient Rx  Name  Route  Sig  Dispense  Refill  . albuterol (PROVENTIL HFA;VENTOLIN HFA) 108 (90 BASE) MCG/ACT inhaler   Inhalation   Inhale 1-2 puffs into the lungs every 6 (six) hours as needed for wheezing.   1 Inhaler   0   . cephALEXin (KEFLEX) 500 MG capsule   Oral   Take 1 capsule (500 mg total) by mouth 2 (two) times daily.   20 capsule   0   . estrogens, conjugated, (PREMARIN) 1.25 MG tablet   Oral   Take 1 tablet (1.25 mg total) by mouth daily.   30 tablet   1   . ibuprofen (ADVIL,MOTRIN) 200 MG tablet   Oral   Take 200 mg by mouth every 6 (six) hours as needed for pain.         . phenazopyridine (PYRIDIUM) 200 MG tablet   Oral   Take 1 tablet (200 mg total) by mouth 3 (three) times daily.   6 tablet   0   . valACYclovir (VALTREX) 500 MG tablet   Oral   Take 1 tablet (500 mg  total) by mouth 3 (three) times daily.   60 tablet   1     Take 500mg  tid for 7 days then 2 a day.    LMP 07/22/2013 Physical Exam  Nursing note and vitals reviewed. Constitutional: She is oriented to person, place, and time. She appears well-developed and well-nourished. No distress.  HENT:  Head: Normocephalic and atraumatic.  Mouth/Throat: Oropharynx is clear and moist.  Eyes: No scleral icterus.  Cardiovascular: Normal heart sounds.   Pulmonary/Chest: Breath sounds normal.  Abdominal: Soft. There is no tenderness.  No CVT  Genitourinary: Uterus normal. Cervix exhibits no motion tenderness and no friability. Right adnexum displays no mass, no tenderness and no fullness. Left adnexum displays no mass, no tenderness and no fullness. There  is bleeding around the vagina.  Neurological: She is alert and oriented to person, place, and time.  Skin: No rash noted. She is not diaphoretic.    ED Course  Procedures (including critical care time) Labs Review Labs Reviewed  POCT URINALYSIS DIP (DEVICE) - Abnormal; Notable for the following:    Hgb urine dipstick LARGE (*)    All other components within normal limits  POCT PREGNANCY, URINE  CERVICOVAGINAL ANCILLARY ONLY   Imaging Review No results found.  MDM   1. Dysuria    No nitrates or LE on point-of-care urinalysis. Blood in urine likely related to menstrual period. No rash suggestive of genital herpes outbreak.  Patient perseverates she felt like she has a UTI; symptoms consistent with a possible UTI. Treated with Keflex and Pyridium. Urine sent for culture. GC Chlamydia and affirm test results pending. Supportive care and red flags that should prompt her return to medical attention discussed with patient and provided in writing.  Sharin Grave, MD 07/24/13 [phone]

## 2013-07-22 NOTE — ED Notes (Signed)
C/o UTI.  Patient is having pressure in abd area and burns when she urinates.  Patient wants to check for STD while here.

## 2013-07-24 LAB — URINE CULTURE: Special Requests: NORMAL

## 2013-07-24 NOTE — ED Notes (Signed)
GC/Chlamydia and Affirm tests all neg., Urine culture: >100,000 colonies E. Coli. Pt. adequately treated with Keflex. Vassie Moselle 07/24/2013

## 2013-07-29 ENCOUNTER — Telehealth (HOSPITAL_COMMUNITY): Payer: Self-pay | Admitting: *Deleted

## 2013-07-29 NOTE — ED Notes (Signed)
Pt. called and said someone had called her today from here. I accessed her chart and told her I did not call.  Pt. verified x 2 and given results.  Pt. told she was adequately treated for UTI with Keflex.  If any symptoms of UTI after she finished antibiotics to come back and be rechecked. Pt. voiced understanding. Vassie Moselle 07/29/2013

## 2013-11-21 ENCOUNTER — Emergency Department (HOSPITAL_BASED_OUTPATIENT_CLINIC_OR_DEPARTMENT_OTHER)
Admission: EM | Admit: 2013-11-21 | Discharge: 2013-11-21 | Disposition: A | Discharge status: Home / Self Care | Payer: Medicaid Other | Attending: Emergency Medicine | Admitting: Emergency Medicine

## 2013-11-21 ENCOUNTER — Encounter (HOSPITAL_BASED_OUTPATIENT_CLINIC_OR_DEPARTMENT_OTHER): Payer: Self-pay | Admitting: Emergency Medicine

## 2013-11-21 DIAGNOSIS — Z8659 Personal history of other mental and behavioral disorders: Secondary | ICD-10-CM | POA: Insufficient documentation

## 2013-11-21 DIAGNOSIS — Z880 Allergy status to penicillin: Secondary | ICD-10-CM | POA: Insufficient documentation

## 2013-11-21 DIAGNOSIS — Z8619 Personal history of other infectious and parasitic diseases: Secondary | ICD-10-CM | POA: Insufficient documentation

## 2013-11-21 DIAGNOSIS — R197 Diarrhea, unspecified: Secondary | ICD-10-CM | POA: Insufficient documentation

## 2013-11-21 DIAGNOSIS — Z79899 Other long term (current) drug therapy: Secondary | ICD-10-CM | POA: Insufficient documentation

## 2013-11-21 DIAGNOSIS — J45901 Unspecified asthma with (acute) exacerbation: Secondary | ICD-10-CM | POA: Insufficient documentation

## 2013-11-21 DIAGNOSIS — Z91040 Latex allergy status: Secondary | ICD-10-CM | POA: Insufficient documentation

## 2013-11-21 DIAGNOSIS — Z3202 Encounter for pregnancy test, result negative: Secondary | ICD-10-CM | POA: Insufficient documentation

## 2013-11-21 DIAGNOSIS — N390 Urinary tract infection, site not specified: Secondary | ICD-10-CM | POA: Insufficient documentation

## 2013-11-21 LAB — CBC
Hemoglobin: 13.2 g/dL (ref 12.0–15.0)
MCHC: 32.6 g/dL (ref 30.0–36.0)
Platelets: 261 10*3/uL (ref 150–400)
RDW: 13.8 % (ref 11.5–15.5)

## 2013-11-21 LAB — COMPREHENSIVE METABOLIC PANEL
ALT: 27 U/L (ref 0–35)
AST: 45 U/L — ABNORMAL HIGH (ref 0–37)
Albumin: 4 g/dL (ref 3.5–5.2)
Alkaline Phosphatase: 62 U/L (ref 39–117)
Potassium: 4 mEq/L (ref 3.5–5.1)
Sodium: 137 mEq/L (ref 135–145)
Total Protein: 7.2 g/dL (ref 6.0–8.3)

## 2013-11-21 LAB — OCCULT BLOOD X 1 CARD TO LAB, STOOL: Fecal Occult Bld: POSITIVE — AB

## 2013-11-21 LAB — URINALYSIS, ROUTINE W REFLEX MICROSCOPIC
Bilirubin Urine: NEGATIVE
Hgb urine dipstick: NEGATIVE
Specific Gravity, Urine: 1.019 (ref 1.005–1.030)
pH: 8 (ref 5.0–8.0)

## 2013-11-21 LAB — URINE MICROSCOPIC-ADD ON: RBC / HPF: NONE SEEN RBC/hpf (ref <3)

## 2013-11-21 LAB — CG4 I-STAT (LACTIC ACID): Lactic Acid, Venous: 1.81 mmol/L (ref 0.5–2.2)

## 2013-11-21 MED ORDER — DICYCLOMINE HCL 10 MG PO CAPS
10.0000 mg | ORAL_CAPSULE | Freq: Once | ORAL | Status: AC
  Administered 2013-11-21: 10 mg via ORAL
  Filled 2013-11-21: qty 1

## 2013-11-21 MED ORDER — CIPROFLOXACIN IN D5W 400 MG/200ML IV SOLN
400.0000 mg | Freq: Once | INTRAVENOUS | Status: AC
  Administered 2013-11-21: 400 mg via INTRAVENOUS
  Filled 2013-11-21: qty 200

## 2013-11-21 MED ORDER — MORPHINE SULFATE 4 MG/ML IJ SOLN
4.0000 mg | Freq: Once | INTRAMUSCULAR | Status: AC
  Administered 2013-11-21: 4 mg via INTRAVENOUS
  Filled 2013-11-21: qty 1

## 2013-11-21 MED ORDER — CIPROFLOXACIN HCL 500 MG PO TABS: 500.0000 mg | ORAL_TABLET | Freq: Two times a day (BID) | ORAL | Status: DC

## 2013-11-21 NOTE — ED Notes (Signed)
Pt reports lower abdominal pain and bloody diarrhea since this morning.  Denies vomiting.  Denies fever.

## 2013-11-21 NOTE — ED Provider Notes (Signed)
CSN: [id number]     Arrival date & time 11/21/13  7829 History   First MD Initiated Contact with Patient 11/21/13 [phone]     Chief Complaint  Patient presents with  . Abdominal Pain   (Consider location/radiation/quality/duration/timing/severity/associated sxs/prior Treatment) Patient is a 32 y.o. female presenting with abdominal pain. The history is provided by the patient.  Abdominal Pain Pain location:  Generalized Pain quality: cramping and squeezing   Pain radiates to:  Does not radiate Pain severity:  Moderate Onset quality:  Sudden Timing:  Intermittent Progression:  Worsening Chronicity:  New Context: recent illness (was treated for unknown bacterial infection recently, cannot tell me if she was on an antibiotic) and suspicious food intake   Context: not diet changes, not eating and not recent travel   Relieved by:  Bowel activity Worsened by:  Nothing tried Associated symptoms: chills, diarrhea and shortness of breath   Associated symptoms: no cough, no fever and no vomiting     Past Medical History  Diagnosis Date  . Febrile illness, acute 01/2011    Hospitalized for 6 days  . Herpes 2008  . Seasonal allergies   . Asthma     inhaler rarely used - twice year  . Anxiety     no meds  . Hx of bronchitis 09/2012   Past Surgical History  Procedure Laterality Date  . Cesarean section  2000 & 2004    "1st one I dilated 1cm, then the 2nd one was a rpt  . Hernia repair    . Wisdom tooth extraction    . Tmj arthroplasty    . Hysteroscopy N/A 03/24/2013    Procedure: HYSTEROSCOPY / Dilitation and curettage/endometrial curettings;  Surgeon: Willodean Rosenthal, MD;  Location: [facility];  Service: Gynecology;  Laterality: N/A;  Diagnostic hysteroscopy   History reviewed. No pertinent family history. History  Substance Use Topics  . Smoking status: Never Smoker   . Smokeless tobacco: Never Used  . Alcohol Use: No   OB History   Grav Para Term Preterm Abortions TAB SAB  Ect Mult Living   3 2 1 1 1  1   2      Review of Systems  Constitutional: Positive for chills. Negative for fever.  Respiratory: Positive for shortness of breath. Negative for cough.   Gastrointestinal: Positive for abdominal pain (cramping) and diarrhea. Negative for vomiting.  All other systems reviewed and are negative.    Allergies  Flagyl; Latex; and Penicillins  Home Medications   Current Outpatient Rx  Name  Route  Sig  Dispense  Refill  . albuterol (PROVENTIL HFA;VENTOLIN HFA) 108 (90 BASE) MCG/ACT inhaler   Inhalation   Inhale 1-2 puffs into the lungs every 6 (six) hours as needed for wheezing.   1 Inhaler   0   . cephALEXin (KEFLEX) 500 MG capsule   Oral   Take 1 capsule (500 mg total) by mouth 2 (two) times daily.   20 capsule   0   . estrogens, conjugated, (PREMARIN) 1.25 MG tablet   Oral   Take 1 tablet (1.25 mg total) by mouth daily.   30 tablet   1   . ibuprofen (ADVIL,MOTRIN) 200 MG tablet   Oral   Take 200 mg by mouth every 6 (six) hours as needed for pain.         . phenazopyridine (PYRIDIUM) 200 MG tablet   Oral   Take 1 tablet (200 mg total) by mouth 3 (three) times daily.  6 tablet   0   . valACYclovir (VALTREX) 500 MG tablet   Oral   Take 1 tablet (500 mg total) by mouth 3 (three) times daily.   60 tablet   1     Take 500mg  tid for 7 days then 2 a day.    BP 120/76  Temp(Src) 98.4 F (36.9 C) (Oral)  Resp 20  Ht 5\' 3"  (1.6 m)  Wt 195 lb (88.451 kg)  BMI 34.55 kg/m2  SpO2 100% Physical Exam  Nursing note and vitals reviewed. Constitutional: She is oriented to person, place, and time. She appears well-developed and well-nourished. No distress.  HENT:  Head: Normocephalic and atraumatic.  Eyes: EOM are normal. Pupils are equal, round, and reactive to light.  Neck: Normal range of motion. Neck supple.  Cardiovascular: Normal rate and regular rhythm.  Exam reveals no friction rub.   No murmur heard. Pulmonary/Chest: Effort  normal and breath sounds normal. No respiratory distress. She has no wheezes. She has no rales.  Abdominal: Soft. She exhibits no distension. There is tenderness (mild, diffuse). There is no rebound and no guarding.  Musculoskeletal: Normal range of motion. She exhibits no edema.  Neurological: She is alert and oriented to person, place, and time. No cranial nerve deficit. She exhibits normal muscle tone.  Skin: No rash noted. She is not diaphoretic.    ED Course  Procedures (including critical care time) Labs Review Labs Reviewed  CBC - Abnormal; Notable for the following:    WBC 10.6 (*)    All other components within normal limits  COMPREHENSIVE METABOLIC PANEL - Abnormal; Notable for the following:    Glucose, Bld 118 (*)    AST 45 (*)    All other components within normal limits  URINALYSIS, ROUTINE W REFLEX MICROSCOPIC - Abnormal; Notable for the following:    APPearance CLOUDY (*)    Leukocytes, UA SMALL (*)    All other components within normal limits  OCCULT BLOOD X 1 CARD TO LAB, STOOL - Abnormal; Notable for the following:    Fecal Occult Bld POSITIVE (*)    All other components within normal limits  URINE MICROSCOPIC-ADD ON - Abnormal; Notable for the following:    Bacteria, UA FEW (*)    All other components within normal limits  LIPASE, BLOOD  PREGNANCY, URINE  CG4 I-STAT (LACTIC ACID)   Imaging Review No results found.  EKG Interpretation   None       MDM   1. UTI (urinary tract infection)   2. Diarrhea    32 year old female presents with lower abdominal pain and diarrhea since this morning. She's had multiple episodes of crampy abdominal pain and diarrhea. She reports one episode of blood in her diarrhea. Has not been persistent blood in her diarrhea. She is not on any anticoagulants. She had ham for the first time yesterday, but is a normal pork he urinates patient regularly. She had a large persistent as she took yesterday. She did not have any other  people gets sick from the scanner. She has had no recent travel. She is not febrile. She has normal blood pressures. She has mild abdominal pain without any focal tenderness with guarding or rebound. I think patient likely has a colitis or diarrhea. I'll check labs, give morphine and Bentyl. Brown stool rectal exam, no gross blood. She reports possible antibiotic use recently, however cannot tell me what for. Labs show UTI. Will treat with Cipro. Patient has mild white count, nonspecific.  She has hemoccult positive blood, with young age and likely colitis, no need for admission. No frank hematochezia, she is not on blood thinners.  Sleeping comfortably on re-exam, stable for discharge.     Dagmar Hait, MD 11/21/13 [phone]

## 2014-03-15 ENCOUNTER — Emergency Department (HOSPITAL_BASED_OUTPATIENT_CLINIC_OR_DEPARTMENT_OTHER)
Admission: EM | Admit: 2014-03-15 | Discharge: 2014-03-16 | Disposition: A | Discharge status: Home / Self Care | Payer: Medicaid Other | Attending: Emergency Medicine | Admitting: Emergency Medicine

## 2014-03-15 ENCOUNTER — Encounter (HOSPITAL_BASED_OUTPATIENT_CLINIC_OR_DEPARTMENT_OTHER): Payer: Self-pay | Admitting: Emergency Medicine

## 2014-03-15 DIAGNOSIS — Z8659 Personal history of other mental and behavioral disorders: Secondary | ICD-10-CM | POA: Insufficient documentation

## 2014-03-15 DIAGNOSIS — Z8619 Personal history of other infectious and parasitic diseases: Secondary | ICD-10-CM | POA: Insufficient documentation

## 2014-03-15 DIAGNOSIS — N949 Unspecified condition associated with female genital organs and menstrual cycle: Secondary | ICD-10-CM | POA: Insufficient documentation

## 2014-03-15 DIAGNOSIS — Z79899 Other long term (current) drug therapy: Secondary | ICD-10-CM | POA: Insufficient documentation

## 2014-03-15 DIAGNOSIS — R102 Pelvic and perineal pain: Secondary | ICD-10-CM

## 2014-03-15 DIAGNOSIS — J45909 Unspecified asthma, uncomplicated: Secondary | ICD-10-CM | POA: Insufficient documentation

## 2014-03-15 DIAGNOSIS — Z880 Allergy status to penicillin: Secondary | ICD-10-CM | POA: Insufficient documentation

## 2014-03-15 DIAGNOSIS — N72 Inflammatory disease of cervix uteri: Secondary | ICD-10-CM | POA: Insufficient documentation

## 2014-03-15 DIAGNOSIS — Z3202 Encounter for pregnancy test, result negative: Secondary | ICD-10-CM | POA: Insufficient documentation

## 2014-03-15 DIAGNOSIS — Z792 Long term (current) use of antibiotics: Secondary | ICD-10-CM | POA: Insufficient documentation

## 2014-03-15 DIAGNOSIS — Z91040 Latex allergy status: Secondary | ICD-10-CM | POA: Insufficient documentation

## 2014-03-15 DIAGNOSIS — N898 Other specified noninflammatory disorders of vagina: Secondary | ICD-10-CM

## 2014-03-15 LAB — CBC WITH DIFFERENTIAL/PLATELET
BASOS PCT: 0 % (ref 0–1)
Basophils Absolute: 0 10*3/uL (ref 0.0–0.1)
EOS ABS: 0.2 10*3/uL (ref 0.0–0.7)
EOS PCT: 2 % (ref 0–5)
HCT: 38.2 % (ref 36.0–46.0)
Hemoglobin: 12.4 g/dL (ref 12.0–15.0)
Lymphocytes Relative: 30 % (ref 12–46)
Lymphs Abs: 2.5 10*3/uL (ref 0.7–4.0)
MCH: 27.9 pg (ref 26.0–34.0)
MCHC: 32.5 g/dL (ref 30.0–36.0)
MCV: 85.8 fL (ref 78.0–100.0)
Monocytes Absolute: 0.7 10*3/uL (ref 0.1–1.0)
Monocytes Relative: 8 % (ref 3–12)
Neutro Abs: 5.1 10*3/uL (ref 1.7–7.7)
Neutrophils Relative %: 60 % (ref 43–77)
PLATELETS: 271 10*3/uL (ref 150–400)
RBC: 4.45 MIL/uL (ref 3.87–5.11)
RDW: 13 % (ref 11.5–15.5)
WBC: 8.5 10*3/uL (ref 4.0–10.5)

## 2014-03-15 LAB — URINALYSIS, ROUTINE W REFLEX MICROSCOPIC
BILIRUBIN URINE: NEGATIVE
Glucose, UA: NEGATIVE mg/dL
Hgb urine dipstick: NEGATIVE
KETONES UR: NEGATIVE mg/dL
Leukocytes, UA: NEGATIVE
NITRITE: NEGATIVE
PROTEIN: NEGATIVE mg/dL
Specific Gravity, Urine: 1.024 (ref 1.005–1.030)
Urobilinogen, UA: 0.2 mg/dL (ref 0.0–1.0)
pH: 6 (ref 5.0–8.0)

## 2014-03-15 LAB — BASIC METABOLIC PANEL
BUN: 17 mg/dL (ref 6–23)
CALCIUM: 9.5 mg/dL (ref 8.4–10.5)
CO2: 26 mEq/L (ref 19–32)
Chloride: 105 mEq/L (ref 96–112)
Creatinine, Ser: 0.8 mg/dL (ref 0.50–1.10)
GFR calc Af Amer: >90 mL/min (ref >90)
Glucose, Bld: 92 mg/dL (ref 70–99)
Potassium: 3.8 mEq/L (ref 3.7–5.3)
SODIUM: 142 meq/L (ref 137–147)

## 2014-03-15 LAB — WET PREP, GENITAL
TRICH WET PREP: NONE SEEN
YEAST WET PREP: NONE SEEN

## 2014-03-15 LAB — PREGNANCY, URINE: Preg Test, Ur: NEGATIVE

## 2014-03-15 MED ORDER — CEFTRIAXONE SODIUM 250 MG IJ SOLR
250.0000 mg | Freq: Once | INTRAMUSCULAR | Status: AC
  Administered 2014-03-16: 250 mg via INTRAMUSCULAR
  Filled 2014-03-15: qty 250

## 2014-03-15 MED ORDER — AZITHROMYCIN 250 MG PO TABS
1000.0000 mg | ORAL_TABLET | Freq: Once | ORAL | Status: AC
  Administered 2014-03-16: 1000 mg via ORAL
  Filled 2014-03-15: qty 4

## 2014-03-15 NOTE — ED Provider Notes (Signed)
CSN: [id number]     Arrival date & time 03/15/14  2227 History   First MD Initiated Contact with Patient 03/15/14 2243     Chief Complaint  Patient presents with  . Abdominal Pain     (Consider location/radiation/quality/duration/timing/severity/associated sxs/prior Treatment) Patient is a 33 y.o. female presenting with vaginal discharge. The history is provided by the patient.  Vaginal Discharge Quality:  Yellow Severity:  Moderate Onset quality:  Gradual Duration:  2 weeks Timing:  Constant Progression:  Worsening Chronicity:  New Context: spontaneously   Relieved by:  Nothing Worsened by:  Nothing tried Ineffective treatments:  None tried Associated symptoms: abdominal pain and vaginal itching   Associated symptoms: no dysuria, no fever, no nausea and no vomiting   Associated symptoms comment:  Pelvic pain Risk factors: new sexual partner, STI and unprotected sex  STD exposure: ?    Connie Jenkins is a 33 y.o. female who presents to the ED with vaginal discharge that started 2 weeks ago and pelvic pain for the past couple days. She reports having had unprotected sex a little over a month ago and thinks she may have gotten an infection. Request STD testing. She has a history of HSV.  Past Medical History  Diagnosis Date  . Febrile illness, acute 01/2011    Hospitalized for 6 days  . Herpes 2008  . Seasonal allergies   . Asthma     inhaler rarely used - twice year  . Anxiety     no meds  . Hx of bronchitis 09/2012   Past Surgical History  Procedure Laterality Date  . Cesarean section  2000 & 2004    "1st one I dilated 1cm, then the 2nd one was a rpt  . Hernia repair    . Wisdom tooth extraction    . Tmj arthroplasty    . Hysteroscopy N/A 03/24/2013    Procedure: HYSTEROSCOPY / Dilitation and curettage/endometrial curettings;  Surgeon: Lavonia Drafts, MD;  Location: [facility];  Service: Gynecology;  Laterality: N/A;  Diagnostic hysteroscopy   History reviewed.  No pertinent family history. History  Substance Use Topics  . Smoking status: Never Smoker   . Smokeless tobacco: Never Used  . Alcohol Use: No   OB History   Grav Para Term Preterm Abortions TAB SAB Ect Mult Living   3 2 1 1 1  1   2      Review of Systems  Constitutional: Negative for fever and chills.  HENT: Positive for congestion and sore throat. Negative for dental problem and ear pain.   Gastrointestinal: Positive for abdominal pain. Negative for nausea and vomiting.  Genitourinary: Positive for vaginal discharge and pelvic pain. Negative for dysuria and frequency.  Musculoskeletal: Negative for back pain and myalgias.  Skin: Negative for rash.  Neurological: Negative for light-headedness and headaches.  Psychiatric/Behavioral: Negative for confusion. The patient is not nervous/anxious.       Allergies  Flagyl; Latex; and Penicillins  Home Medications   Prior to Admission medications   Medication Sig Start Date End Date Taking? Authorizing Provider  albuterol (PROVENTIL HFA;VENTOLIN HFA) 108 (90 BASE) MCG/ACT inhaler Inhale 1-2 puffs into the lungs every 6 (six) hours as needed for wheezing. 09/25/12   Adlih Moreno-Coll, MD  cephALEXin (KEFLEX) 500 MG capsule Take 1 capsule (500 mg total) by mouth 2 (two) times daily. 07/22/13   Adlih Moreno-Coll, MD  ciprofloxacin (CIPRO) 500 MG tablet Take 1 tablet (500 mg total) by mouth 2 (two) times daily. 11/21/13  Osvaldo Shipper, MD  estrogens, conjugated, (PREMARIN) 1.25 MG tablet Take 1 tablet (1.25 mg total) by mouth daily. 04/09/13   Lavonia Drafts, MD  ibuprofen (ADVIL,MOTRIN) 200 MG tablet Take 200 mg by mouth every 6 (six) hours as needed for pain.    Historical Provider, MD  phenazopyridine (PYRIDIUM) 200 MG tablet Take 1 tablet (200 mg total) by mouth 3 (three) times daily. 07/22/13   Adlih Moreno-Coll, MD  valACYclovir (VALTREX) 500 MG tablet Take 1 tablet (500 mg total) by mouth 3 (three) times daily. 03/24/13    Lavonia Drafts, MD   BP 132/78  Pulse 76  Temp(Src) 98.5 F (36.9 C) (Oral)  Resp 16  SpO2 100%  LMP 01/26/2014 Physical Exam  Nursing note and vitals reviewed. Constitutional: She is oriented to person, place, and time. She appears well-developed and well-nourished. No distress.  HENT:  Head: Normocephalic.  Eyes: EOM are normal.  Neck: Neck supple.  Cardiovascular: Normal rate.   Pulmonary/Chest: Effort normal.  Abdominal: Soft. There is tenderness. There is no rebound, no guarding and no CVA tenderness.  Tender with palpation lower abdomen  Genitourinary:  External genitalia without lesions, frothy discharge vaginal vault, positive CMT, bilateral adnexal tenderness that is mild, uterus without palpable enlargement.    Musculoskeletal: Normal range of motion.  Neurological: She is alert and oriented to person, place, and time. No cranial nerve deficit.  Skin: Skin is warm and dry.  Psychiatric: She has a normal mood and affect. Her behavior is normal.    ED Course: Labs, Rocephin 250 mg IM, Zithromax 1 gram PO  Procedures  MDM   Results for orders placed during the hospital encounter of 03/15/14 (from the past 24 hour(s))  URINALYSIS, ROUTINE W REFLEX MICROSCOPIC     Status: None   Collection Time    03/15/14 10:45 PM      Result Value Ref Range   Color, Urine YELLOW  YELLOW   APPearance CLEAR  CLEAR   Specific Gravity, Urine 1.024  1.005 - 1.030   pH 6.0  5.0 - 8.0   Glucose, UA NEGATIVE  NEGATIVE mg/dL   Hgb urine dipstick NEGATIVE  NEGATIVE   Bilirubin Urine NEGATIVE  NEGATIVE   Ketones, ur NEGATIVE  NEGATIVE mg/dL   Protein, ur NEGATIVE  NEGATIVE mg/dL   Urobilinogen, UA 0.2  0.0 - 1.0 mg/dL   Nitrite NEGATIVE  NEGATIVE   Leukocytes, UA NEGATIVE  NEGATIVE  PREGNANCY, URINE     Status: None   Collection Time    03/15/14 10:45 PM      Result Value Ref Range   Preg Test, Ur NEGATIVE  NEGATIVE  WET PREP, GENITAL     Status: Abnormal   Collection Time     03/15/14 11:10 PM      Result Value Ref Range   Yeast Wet Prep HPF POC NONE SEEN  NONE SEEN   Trich, Wet Prep NONE SEEN  NONE SEEN   Clue Cells Wet Prep HPF POC MANY (*) NONE SEEN   WBC, Wet Prep HPF POC FEW (*) NONE SEEN  CBC WITH DIFFERENTIAL     Status: None   Collection Time    03/15/14 11:15 PM      Result Value Ref Range   WBC 8.5  4.0 - 10.5 K/uL   RBC 4.45  3.87 - 5.11 MIL/uL   Hemoglobin 12.4  12.0 - 15.0 g/dL   HCT 38.2  36.0 - 46.0 %   MCV 85.8  78.0 -  100.0 fL   MCH 27.9  26.0 - 34.0 pg   MCHC 32.5  30.0 - 36.0 g/dL   RDW 13.0  11.5 - 15.5 %   Platelets 271  150 - 400 K/uL   Neutrophils Relative % 60  43 - 77 %   Neutro Abs 5.1  1.7 - 7.7 K/uL   Lymphocytes Relative 30  12 - 46 %   Lymphs Abs 2.5  0.7 - 4.0 K/uL   Monocytes Relative 8  3 - 12 %   Monocytes Absolute 0.7  0.1 - 1.0 K/uL   Eosinophils Relative 2  0 - 5 %   Eosinophils Absolute 0.2  0.0 - 0.7 K/uL   Basophils Relative 0  0 - 1 %   Basophils Absolute 0.0  0.0 - 0.1 K/uL   33 y.o. female with vaginal discharge and pelvic pain. Will treat for cervicitis and BV. Cultures sent for GC and Chlamydia. Patient stable for discharge with negative pregnancy test so no concern for ectopic, normal CBC. Discussed with the patient and all questioned fully answered. She will return tomorrow for pelvic ultrasound.    Medication List    TAKE these medications       clindamycin 150 MG capsule  Commonly known as:  CLEOCIN  Take 1 capsule (150 mg total) by mouth 3 (three) times daily.      ASK your doctor about these medications       albuterol 108 (90 BASE) MCG/ACT inhaler  Commonly known as:  PROVENTIL HFA;VENTOLIN HFA  Inhale 1-2 puffs into the lungs every 6 (six) hours as needed for wheezing.     cephALEXin 500 MG capsule  Commonly known as:  KEFLEX  Take 1 capsule (500 mg total) by mouth 2 (two) times daily.     ciprofloxacin 500 MG tablet  Commonly known as:  CIPRO  Take 1 tablet (500 mg total) by  mouth 2 (two) times daily.     estrogens (conjugated) 1.25 MG tablet  Commonly known as:  PREMARIN  Take 1 tablet (1.25 mg total) by mouth daily.     ibuprofen 200 MG tablet  Commonly known as:  ADVIL,MOTRIN  Take 200 mg by mouth every 6 (six) hours as needed for pain.     phenazopyridine 200 MG tablet  Commonly known as:  PYRIDIUM  Take 1 tablet (200 mg total) by mouth 3 (three) times daily.     valACYclovir 500 MG tablet  Commonly known as:  VALTREX  Take 1 tablet (500 mg total) by mouth 3 (three) times daily.               [facility] Bunnie Pion, [place] 03/16/14 [phone]

## 2014-03-15 NOTE — ED Notes (Signed)
Pt reports abd pain and vaginal discharge x 2 weeks and believes it is related to unprotected sex 1.5 mos ago

## 2014-03-16 ENCOUNTER — Ambulatory Visit (HOSPITAL_BASED_OUTPATIENT_CLINIC_OR_DEPARTMENT_OTHER)
Admission: RE | Admit: 2014-03-16 | Discharge: 2014-03-16 | Disposition: A | Discharge status: Home / Self Care | Payer: Medicaid Other | Source: Ambulatory Visit | Attending: Nurse Practitioner | Admitting: Nurse Practitioner

## 2014-03-16 ENCOUNTER — Other Ambulatory Visit (HOSPITAL_BASED_OUTPATIENT_CLINIC_OR_DEPARTMENT_OTHER): Payer: Self-pay | Admitting: Nurse Practitioner

## 2014-03-16 DIAGNOSIS — R102 Pelvic and perineal pain: Secondary | ICD-10-CM

## 2014-03-16 LAB — GC/CHLAMYDIA PROBE AMP
CT Probe RNA: NEGATIVE
GC PROBE AMP APTIMA: NEGATIVE

## 2014-03-16 MED ORDER — CLINDAMYCIN HCL 150 MG PO CAPS: 150.0000 mg | ORAL_CAPSULE | Freq: Three times a day (TID) | ORAL | Status: DC

## 2014-03-16 MED ORDER — LIDOCAINE HCL (PF) 1 % IJ SOLN
INTRAMUSCULAR | Status: AC
  Administered 2014-03-16: 5 mL
  Filled 2014-03-16: qty 5

## 2014-03-16 NOTE — ED Provider Notes (Signed)
Medical screening examination/treatment/procedure(s) were performed by non-physician practitioner and as supervising physician I was immediately available for consultation/collaboration.   EKG Interpretation None       Alice Burnside K Colletta Spillers-Rasch, MD 03/16/14 [phone]

## 2014-03-16 NOTE — Discharge Instructions (Signed)
Return tomorrow for an ultrasound. Take the medication as directed. Take ibuprofen for pain.

## 2014-05-12 ENCOUNTER — Inpatient Hospital Stay (HOSPITAL_COMMUNITY)
Admission: AD | Admit: 2014-05-12 | Discharge: 2014-05-12 | Disposition: A | Discharge status: Home / Self Care | Payer: Medicaid Other | Source: Ambulatory Visit | Attending: Obstetrics | Admitting: Obstetrics

## 2014-05-12 ENCOUNTER — Encounter (HOSPITAL_COMMUNITY): Payer: Self-pay | Admitting: *Deleted

## 2014-05-12 DIAGNOSIS — N949 Unspecified condition associated with female genital organs and menstrual cycle: Secondary | ICD-10-CM | POA: Insufficient documentation

## 2014-05-12 DIAGNOSIS — N898 Other specified noninflammatory disorders of vagina: Secondary | ICD-10-CM | POA: Insufficient documentation

## 2014-05-12 DIAGNOSIS — R102 Pelvic and perineal pain: Secondary | ICD-10-CM

## 2014-05-12 DIAGNOSIS — N926 Irregular menstruation, unspecified: Secondary | ICD-10-CM | POA: Insufficient documentation

## 2014-05-12 LAB — WET PREP, GENITAL
CLUE CELLS WET PREP: NONE SEEN
Trich, Wet Prep: NONE SEEN
Yeast Wet Prep HPF POC: NONE SEEN

## 2014-05-12 LAB — URINALYSIS, ROUTINE W REFLEX MICROSCOPIC
BILIRUBIN URINE: NEGATIVE
Glucose, UA: NEGATIVE mg/dL
HGB URINE DIPSTICK: NEGATIVE
Ketones, ur: NEGATIVE mg/dL
Nitrite: NEGATIVE
PROTEIN: NEGATIVE mg/dL
Specific Gravity, Urine: 1.015 (ref 1.005–1.030)
UROBILINOGEN UA: 0.2 mg/dL (ref 0.0–1.0)
pH: 7 (ref 5.0–8.0)

## 2014-05-12 LAB — POCT PREGNANCY, URINE: Preg Test, Ur: NEGATIVE

## 2014-05-12 LAB — URINE MICROSCOPIC-ADD ON

## 2014-05-12 MED ORDER — CEFTRIAXONE SODIUM 250 MG IJ SOLR
250.0000 mg | INTRAMUSCULAR | Status: DC
  Administered 2014-05-12: 250 mg via INTRAMUSCULAR
  Filled 2014-05-12: qty 250

## 2014-05-12 MED ORDER — AZITHROMYCIN 250 MG PO TABS
1000.0000 mg | ORAL_TABLET | Freq: Once | ORAL | Status: AC
  Administered 2014-05-12: 1000 mg via ORAL
  Filled 2014-05-12: qty 4

## 2014-05-12 NOTE — MAU Note (Signed)
Patient presents with complaints of pelvic pain X 1 month and a vaginal discharge X 1 week and possible pregnancy.

## 2014-05-12 NOTE — Progress Notes (Signed)
Written and verbal d/c instructions given and understanding voiced. 

## 2014-05-12 NOTE — MAU Provider Note (Signed)
History     CSN: [id number]  Arrival date and time: 05/12/14 1329   First Provider Initiated Contact with Patient 05/12/14 1428      Chief Complaint  Patient presents with  . Vaginal Discharge  . Possible Pregnancy  . Pelvic Pain   HPI [name] is a 32 y.o. [phone] who presents to MAU today with complaint of vaginal discharge x 1 week and pelvic pain x 1 month. The patient states a thick, grey discharge that is itching and irritative. She states that in April she was diagnosed with an ovarian cyst and is unsure if that could be causing her pain. The patient also states that her partner mentioned burning with urination and went to the doctor and "was given a shot." She also endorses mild nausea without vomiting, diarrhea or constipation. She denies UTI symptoms, vaginal bleeding or fever. She states LMP of 03/28/14 and a history of irregular periods.   OB History   Grav Para Term Preterm Abortions TAB SAB Ect Mult Living   3 2 1 1 1  1   2       Past Medical History  Diagnosis Date  . Febrile illness, acute 01/2011    Hospitalized for 6 days  . Herpes 2008  . Seasonal allergies   . Asthma     inhaler rarely used - twice year  . Anxiety     no meds  . Hx of bronchitis 09/2012    Past Surgical History  Procedure Laterality Date  . Cesarean section  2000 & 2004    "1st one I dilated 1cm, then the 2nd one was a rpt  . Hernia repair    . Wisdom tooth extraction    . Tmj arthroplasty    . Hysteroscopy N/A 03/24/2013    Procedure: HYSTEROSCOPY / Dilitation and curettage/endometrial curettings;  Surgeon: Lavonia Drafts, MD;  Location: [facility];  Service: Gynecology;  Laterality: N/A;  Diagnostic hysteroscopy    History reviewed. No pertinent family history.  History  Substance Use Topics  . Smoking status: Never Smoker   . Smokeless tobacco: Never Used  . Alcohol Use: No    Allergies:  Allergies  Allergen Reactions  . Flagyl [Metronidazole] Nausea And  Vomiting  . Latex Itching  . Penicillins Hives and Other (See Comments)    Childhood allergy    Prescriptions prior to admission  Medication Sig Dispense Refill  . valACYclovir (VALTREX) 500 MG tablet Take 500 mg by mouth daily as needed (For outbreak.).      . [DISCONTINUED] valACYclovir (VALTREX) 500 MG tablet Take 1 tablet (500 mg total) by mouth 3 (three) times daily.  60 tablet  1  . albuterol (PROVENTIL HFA;VENTOLIN HFA) 108 (90 BASE) MCG/ACT inhaler Inhale 1-2 puffs into the lungs every 6 (six) hours as needed for wheezing.  1 Inhaler  0    Review of Systems  Constitutional: Negative for fever and malaise/fatigue.  Gastrointestinal: Positive for nausea and abdominal pain. Negative for vomiting, diarrhea and constipation.  Genitourinary: Negative for dysuria, urgency and frequency.       + vaginal discharge Neg - vaginal bleeding   Physical Exam   Blood pressure 127/81, pulse 68, temperature 98 F (36.7 C), temperature source Oral, resp. rate 18, height 5\' 3"  (1.6 m), weight 200 lb (90.719 kg), last menstrual period 03/28/2014.  Physical Exam  Constitutional: She is oriented to person, place, and time. She appears well-developed and well-nourished. No distress.  HENT:  Head: Normocephalic  and atraumatic.  Cardiovascular: Normal rate.   Respiratory: Effort normal.  GI: Soft. Bowel sounds are normal. She exhibits no distension and no mass. There is no tenderness. There is no rebound and no guarding.  Genitourinary: Uterus is not enlarged and not tender. Cervix exhibits no motion tenderness, no discharge and no friability. Right adnexum displays tenderness (mild). Right adnexum displays no mass. Left adnexum displays tenderness. Left adnexum displays no mass. No bleeding around the vagina. Vaginal discharge (small amount of thin, white discharge noted) found.  Neurological: She is alert and oriented to person, place, and time.  Skin: Skin is warm and dry. No erythema.   Psychiatric: She has a normal mood and affect.   Results for orders placed during the hospital encounter of 05/12/14 (from the past 24 hour(s))  URINALYSIS, ROUTINE W REFLEX MICROSCOPIC     Status: Abnormal   Collection Time    05/12/14  1:52 PM      Result Value Ref Range   Color, Urine YELLOW  YELLOW   APPearance CLEAR  CLEAR   Specific Gravity, Urine 1.015  1.005 - 1.030   pH 7.0  5.0 - 8.0   Glucose, UA NEGATIVE  NEGATIVE mg/dL   Hgb urine dipstick NEGATIVE  NEGATIVE   Bilirubin Urine NEGATIVE  NEGATIVE   Ketones, ur NEGATIVE  NEGATIVE mg/dL   Protein, ur NEGATIVE  NEGATIVE mg/dL   Urobilinogen, UA 0.2  0.0 - 1.0 mg/dL   Nitrite NEGATIVE  NEGATIVE   Leukocytes, UA TRACE (*) NEGATIVE  URINE MICROSCOPIC-ADD ON     Status: Abnormal   Collection Time    05/12/14  1:52 PM      Result Value Ref Range   Squamous Epithelial / LPF MANY (*) RARE   WBC, UA 3-6  <3 WBC/hpf   RBC / HPF 0-2  <3 RBC/hpf   Urine-Other MUCOUS PRESENT    POCT PREGNANCY, URINE     Status: None   Collection Time    05/12/14  1:53 PM      Result Value Ref Range   Preg Test, Ur NEGATIVE  NEGATIVE  WET PREP, GENITAL     Status: Abnormal   Collection Time    05/12/14  2:40 PM      Result Value Ref Range   Yeast Wet Prep HPF POC NONE SEEN  NONE SEEN   Trich, Wet Prep NONE SEEN  NONE SEEN   Clue Cells Wet Prep HPF POC NONE SEEN  NONE SEEN   WBC, Wet Prep HPF POC FEW (*) NONE SEEN    MAU Course  Procedures None  MDM Will treat today for possible STD based on partner ? Treatment and patient symptoms 1 G PO Zithromax and 250 mg Rocephin IM given today Assessment and Plan  A: Irregular menses Pelvic pain  P: Discharge home Patient treated with Zithromax and Rocephin in MAU today Patient advised to follow-up with Dr. Ruthann Cancer for annual exam and if pain persists or worsens Patient may return to MAU as needed or if her condition were to change or worsen  Farris Has, PA-C  05/12/2014, 3:16 PM

## 2014-05-12 NOTE — Discharge Instructions (Signed)
Pelvic Pain, Female °Female pelvic pain can be caused by many different things and start from a variety of places. Pelvic pain refers to pain that is located in the lower half of the abdomen and between your hips. The pain may occur over a short period of time (acute) or may be reoccurring (chronic). The cause of pelvic pain may be related to disorders affecting the female reproductive organs (gynecologic), but it may also be related to the bladder, kidney stones, an intestinal complication, or muscle or skeletal problems. Getting help right away for pelvic pain is important, especially if there has been severe, sharp, or a sudden onset of unusual pain. It is also important to get help right away because some types of pelvic pain can be life threatening.  °CAUSES  °Below are only some of the causes of pelvic pain. The causes of pelvic pain can be in one of several categories.  °· Gynecologic. °· Pelvic inflammatory disease. °· Sexually transmitted infection. °· Ovarian cyst or a twisted ovarian ligament (ovarian torsion). °· Uterine lining that grows outside the uterus (endometriosis). °· Fibroids, cysts, or tumors. °· Ovulation. °· Pregnancy. °· Pregnancy that occurs outside the uterus (ectopic pregnancy). °· Miscarriage. °· Labor. °· Abruption of the placenta or ruptured uterus. °· Infection. °· Uterine infection (endometritis). °· Bladder infection. °· Diverticulitis. °· Miscarriage related to a uterine infection (septic abortion). °· Bladder. °· Inflammation of the bladder (cystitis). °· Kidney stone(s). °· Gastrointenstinal. °· Constipation. °· Diverticulitis. °· Neurologic. °· Trauma. °· Feeling pelvic pain because of mental or emotional causes (psychosomatic). °· Cancers of the bowel or pelvis. °EVALUATION  °Your caregiver will want to take a careful history of your concerns. This includes recent changes in your health, a careful gynecologic history of your periods (menses), and a sexual history. Obtaining  your family history and medical history is also important. Your caregiver may suggest a pelvic exam. A pelvic exam will help identify the location and severity of the pain. It also helps in the evaluation of which organ system may be involved. In order to identify the cause of the pelvic pain and be properly treated, your caregiver may order tests. These tests may include:  °· A pregnancy test. °· Pelvic ultrasonography. °· An X-ray exam of the abdomen. °· A urinalysis or evaluation of vaginal discharge. °· Blood tests. °HOME CARE INSTRUCTIONS  °· Only take over-the-counter or prescription medicines for pain, discomfort, or fever as directed by your caregiver.   °· Rest as directed by your caregiver.   °· Eat a balanced diet.   °· Drink enough fluids to make your urine clear or pale yellow, or as directed.   °· Avoid sexual intercourse if it causes pain.   °· Apply warm or cold compresses to the lower abdomen depending on which one helps the pain.   °· Avoid stressful situations.   °· Keep a journal of your pelvic pain. Write down when it started, where the pain is located, and if there are things that seem to be associated with the pain, such as food or your menstrual cycle. °· Follow up with your caregiver as directed.   °SEEK MEDICAL CARE IF: °· Your medicine does not help your pain. °· You have abnormal vaginal discharge. °SEEK IMMEDIATE MEDICAL CARE IF:  °· You have heavy bleeding from the vagina.   °· Your pelvic pain increases.   °· You feel lightheaded or faint.   °· You have chills.   °· You have pain with urination or blood in your urine.   °· You have uncontrolled   diarrhea or vomiting.   °· You have a fever or persistent symptoms for more than 3 days. °· You have a fever and your symptoms suddenly get worse.   °· You are being physically or sexually abused.   °MAKE SURE YOU: °· Understand these instructions. °· Will watch your condition. °· Will get help if you are not doing well or get worse. °Document  Released: 10/10/2004 Document Revised: 05/14/2012 Document Reviewed: 03/04/2012 °[employer]® Patient Information ©2014 [employer]. ° °

## 2014-05-13 LAB — GC/CHLAMYDIA PROBE AMP
CT Probe RNA: NEGATIVE
GC Probe RNA: NEGATIVE

## 2014-05-22 ENCOUNTER — Emergency Department (HOSPITAL_COMMUNITY)
Admission: EM | Admit: 2014-05-22 | Discharge: 2014-05-23 | Disposition: A | Discharge status: Home / Self Care | Payer: Medicaid Other | Attending: Emergency Medicine | Admitting: Emergency Medicine

## 2014-05-22 ENCOUNTER — Emergency Department (HOSPITAL_COMMUNITY): Payer: Medicaid Other

## 2014-05-22 ENCOUNTER — Encounter (HOSPITAL_COMMUNITY): Payer: Self-pay | Admitting: Emergency Medicine

## 2014-05-22 DIAGNOSIS — J309 Allergic rhinitis, unspecified: Secondary | ICD-10-CM | POA: Insufficient documentation

## 2014-05-22 DIAGNOSIS — R51 Headache: Secondary | ICD-10-CM | POA: Insufficient documentation

## 2014-05-22 DIAGNOSIS — Z8659 Personal history of other mental and behavioral disorders: Secondary | ICD-10-CM | POA: Insufficient documentation

## 2014-05-22 DIAGNOSIS — J45909 Unspecified asthma, uncomplicated: Secondary | ICD-10-CM | POA: Insufficient documentation

## 2014-05-22 DIAGNOSIS — B349 Viral infection, unspecified: Secondary | ICD-10-CM

## 2014-05-22 DIAGNOSIS — Z79899 Other long term (current) drug therapy: Secondary | ICD-10-CM | POA: Insufficient documentation

## 2014-05-22 DIAGNOSIS — B9789 Other viral agents as the cause of diseases classified elsewhere: Secondary | ICD-10-CM | POA: Insufficient documentation

## 2014-05-22 DIAGNOSIS — Z91040 Latex allergy status: Secondary | ICD-10-CM | POA: Insufficient documentation

## 2014-05-22 DIAGNOSIS — Z880 Allergy status to penicillin: Secondary | ICD-10-CM | POA: Insufficient documentation

## 2014-05-22 DIAGNOSIS — Z8619 Personal history of other infectious and parasitic diseases: Secondary | ICD-10-CM | POA: Insufficient documentation

## 2014-05-22 DIAGNOSIS — J069 Acute upper respiratory infection, unspecified: Secondary | ICD-10-CM | POA: Insufficient documentation

## 2014-05-22 DIAGNOSIS — Z9109 Other allergy status, other than to drugs and biological substances: Secondary | ICD-10-CM

## 2014-05-22 LAB — RAPID STREP SCREEN (MED CTR MEBANE ONLY): Streptococcus, Group A Screen (Direct): NEGATIVE

## 2014-05-22 NOTE — ED Notes (Signed)
Pt. reports sore throat with dry cough onset 4 days ago , denies fever or chills . Airway intact / respirations unlabored .

## 2014-05-22 NOTE — ED Notes (Signed)
Pt st's she has had a sore throat x's 1 week, painful to swallow.  No resp distress present.

## 2014-05-22 NOTE — ED Provider Notes (Signed)
CSN: [id number]     Arrival date & time 05/22/14  1950 History   First MD Initiated Contact with Patient 05/22/14 2104   This chart was scribed for non-physician practitioner Jamse Mead, PA-C  working with Richarda Blade, MD by Anastasia Pall, ED scribe. This patient was seen in room TR10C/TR10C and the patient's care was started at 10:24 PM.    Chief Complaint  Patient presents with  . Sore Throat   The history is provided by the patient. No language interpreter was used.   HPI Comments: Connie Jenkins is a 33 y.o. female with PMHx of herpes, seasonal allergies, asthma, anxiety, bronchitis presents to the Emergency Department complaining of a constant sore throat, onset 4 days ago, with associated symptoms of dry cough, headache, bilateral ear pain, and feverish feeling. Stated that she has been taking Aleve without relief. Pt reports having seasonal allergies, takes OTC allergy medications without relief. Pt reports that she has remained hydrated. Pt denies being around any known sick contacts. Pt denies chills, nausea, vomiting, neck pain, neck stiffness, abdominal pain, weakness. She denies h/o smoking.   Connie Heller, MD  Past Medical History  Diagnosis Date  . Febrile illness, acute 01/2011    Hospitalized for 6 days  . Herpes 2008  . Seasonal allergies   . Asthma     inhaler rarely used - twice year  . Anxiety     no meds  . Hx of bronchitis 09/2012   Past Surgical History  Procedure Laterality Date  . Cesarean section  2000 & 2004    "1st one I dilated 1cm, then the 2nd one was a rpt  . Hernia repair    . Wisdom tooth extraction    . Tmj arthroplasty    . Hysteroscopy N/A 03/24/2013    Procedure: HYSTEROSCOPY / Dilitation and curettage/endometrial curettings;  Surgeon: Lavonia Drafts, MD;  Location: [facility];  Service: Gynecology;  Laterality: N/A;  Diagnostic hysteroscopy   No family history on file. History  Substance Use Topics  . Smoking  status: Never Smoker   . Smokeless tobacco: Never Used  . Alcohol Use: No   OB History   Grav Para Term Preterm Abortions TAB SAB Ect Mult Living   3 2 1 1 1  1   2      Review of Systems  Constitutional: Positive for fever (low grade). Negative for chills.  HENT: Positive for congestion, ear pain and sore throat. Negative for ear discharge and trouble swallowing.   Eyes: Positive for itching. Negative for pain and discharge.  Respiratory: Positive for cough.   Gastrointestinal: Negative for nausea, vomiting and diarrhea.  Genitourinary: Negative for dysuria, urgency, frequency and difficulty urinating.  Musculoskeletal: Negative for back pain, neck pain and neck stiffness.  Allergic/Immunologic: Positive for environmental allergies.  Neurological: Positive for headaches.   Allergies  Flagyl; Latex; and Penicillins  Home Medications   Prior to Admission medications   Medication Sig Start Date End Date Taking? Authorizing Sabian Kuba  cetirizine (ZYRTEC) 10 MG tablet Take 1 tablet (10 mg total) by mouth daily. 05/23/14   Marissa Sciacca, PA-C   BP 107/62  Pulse 83  Temp(Src) 98.9 F (37.2 C) (Oral)  Resp 14  Ht 5\' 3"  (1.6 m)  Wt 198 lb (89.812 kg)  BMI 35.08 kg/m2  SpO2 99%  LMP 05/12/2014 Physical Exam  Nursing note and vitals reviewed. Constitutional: She is oriented to person, place, and time. She appears well-developed and well-nourished. No distress.  HENT:  Head: Normocephalic and atraumatic.  Right Ear: Hearing, tympanic membrane, external ear and ear canal normal.  Left Ear: Hearing, tympanic membrane, external ear and ear canal normal.  Mouth/Throat: Oropharynx is clear and moist. No oropharyngeal exudate.  Negative swelling to the tonsils bilaterally. Uvula midline with symmetrical elevation. Negative exudate. Negative petechiae. Negative posterior oropharynx swelling.  Eyes: Conjunctivae and EOM are normal. Pupils are equal, round, and reactive to light. Right eye  exhibits no discharge. Left eye exhibits no discharge.  Neck: Normal range of motion. Neck supple. No tracheal deviation present.  Negative neck stiffness Negative nuchal rigidity Next cervical lymphadenopathy Negative meningeal signs  Cardiovascular: Normal rate, regular rhythm and normal heart sounds.  Exam reveals no friction rub.   No murmur heard. Pulses:      Radial pulses are 2+ on the right side, and 2+ on the left side.       Dorsalis pedis pulses are 2+ on the right side, and 2+ on the left side.  Pulmonary/Chest: Effort normal and breath sounds normal. No respiratory distress. She has no wheezes. She has no rales.  Patient is able to speak in full sentences without difficulty Negative use of accessory muscles Negative stridor  Musculoskeletal: Normal range of motion.  Full ROM to upper and lower extremities without difficulty noted, negative ataxia noted.  Lymphadenopathy:    She has no cervical adenopathy.  Neurological: She is alert and oriented to person, place, and time. No cranial nerve deficit. She exhibits normal muscle tone. Coordination normal.  Cranial nerves III-XII grossly intact Strength 5+/5+ to upper and lower extremities bilaterally with resistance applied, equal distribution noted Equal grip strength bilaterally Negative arm drift Fine motor skills intact Heel to knee down shin normal bilaterally Gait proper, proper balance - negative sway, negative drift, negative step-offs  Skin: Skin is warm and dry. No rash noted. She is not diaphoretic. No erythema.  Psychiatric: She has a normal mood and affect. Her behavior is normal. Thought content normal.    ED Course  Procedures (including critical care time) DIAGNOSTIC STUDIES: Oxygen Saturation is 99% on RA, normal by my interpretation.    COORDINATION OF CARE: 10:30 PM- Discussed negative strep screen with pt. Discussed treatment plan which includes CXR with pt at bedside and pt agreed to plan.    Medications - No data to display  Results for orders placed during the hospital encounter of 05/22/14  RAPID STREP SCREEN      Result Value Ref Range   Streptococcus, Group A Screen (Direct) NEGATIVE  NEGATIVE   Dg Chest 2 View  05/22/2014   CLINICAL DATA:  Cough.  EXAM: CHEST  2 VIEW  COMPARISON:  February 15, 2011.  FINDINGS: The heart size and mediastinal contours are within normal limits. Both lungs are clear. No pneumothorax or pleural effusion is noted. The visualized skeletal structures are unremarkable.  IMPRESSION: No acute cardiopulmonary abnormality seen.   Electronically Signed   By: Sabino Dick M.D.   On: 05/22/2014 23:38  .   EKG Interpretation None      MDM   Final diagnoses:  Environmental allergies  Upper respiratory infection  Viral illness    Filed Vitals:   05/22/14 2008  BP: 107/62  Pulse: 83  Temp: 98.9 F (37.2 C)  TempSrc: Oral  Resp: 14  Height: 5\' 3"  (1.6 m)  Weight: 198 lb (89.812 kg)  SpO2: 99%   Rapid strep test negative. Chest x-ray negative for acute cardiac pulmonary disease.  Doubt peritonsillar abscess. Doubt Streptococcus pharyngitis. Doubt retropharyngeal abscess. Doubt pneumonia. Suspicion to be upper respiratory infection, viral in nature-cannot rule out possible allergies. Patient stable, afebrile. Patient not septic appearing. Discharged patient. Discharged patient with allergy medication. Discussed with patient to rest and stay hydrated. Discussed with patient to avoid any physical or shortness activity. Discussed with patient to stay hydrated and drink plenty of water. Referred patient to primary care Earnestene Angello. Discussed with patient to closely monitor symptoms and if symptoms are to worsen or change to report back to the ED - strict return instructions given.  Patient agreed to plan of care, understood, all questions answered.   Patient left without discharge paperwork.   Jamse Mead, PA-C 05/23/14 1301

## 2014-05-22 NOTE — ED Notes (Signed)
Pt to xray at this time.

## 2014-05-23 MED ORDER — CETIRIZINE HCL 10 MG PO TABS: 10.0000 mg | ORAL_TABLET | Freq: Every day | ORAL | Status: DC

## 2014-05-23 NOTE — Discharge Instructions (Signed)
Please call your doctor for a followup appointment within 24-48 hours. When you talk to your doctor please let them know that you were seen in the emergency department and have them acquire all of your records so that they can discuss the findings with you and formulate a treatment plan to fully care for your new and ongoing problems. Please call and set-up an appointment with your primary care provider to be seen and re-assessed  Please rest and stay hydrated Please avoid any physical or strenuous activity Please drink plenty of water Please continue to monitor symptoms closely and if symptoms are to worsen or change (fever greater than 101, chills, chest pain, shortness of breath, difficulty breathing, numbness, tingling, worsening or changes to pain pattern, coughing up blood, worsening changes to pain symptoms, neck swelling, nausea, vomiting, diarrhea, stomach pain) please report back to the ED immediately   Allergies  Allergies may happen from anything your body is sensitive to. This may be food, medicines, pollens, chemicals, and many other things. Food allergies can be severe and deadly.  HOME CARE  If you do not know what causes a reaction, keep a diary. Write down the foods you ate and the symptoms that followed. Avoid foods that cause reactions.  If you have red raised spots (hives) or a rash:  Take medicine as told by your doctor.  Use medicines for red raised spots and itching as needed.  Apply cold cloths (compresses) to the skin. Take a cool bath. Avoid hot baths or showers.  If you are severely allergic:  It is often necessary to go to the hospital after you have treated your reaction.  Wear your medical alert jewelry.  You and your family must learn how to give a allergy shot or use an allergy kit (anaphylaxis kit).  Always carry your allergy kit or shot with you. Use this medicine as told by your doctor if a severe reaction is occurring. GET HELP RIGHT AWAY IF:  You  have trouble breathing or are making high-pitched whistling sounds (wheezing).  You have a tight feeling in your chest or throat.  You have a puffy (swollen) mouth.  You have red raised spots, puffiness (swelling), or itching all over your body.  You have had a severe reaction that was helped by your allergy kit or shot. The reaction can return once the medicine has worn off.  You think you are having a food allergy. Symptoms most often happen within 30 minutes of eating a food.  Your symptoms have not gone away within 2 days or are getting worse.  You have new symptoms.  You want to retest yourself with a food or drink you think causes an allergic reaction. Only do this under the care of a doctor. MAKE SURE YOU:   Understand these instructions.  Will watch your condition.  Will get help right away if you are not doing well or get worse. Document Released: 03/10/2013 Document Reviewed: 03/10/2013 [facility] Patient Information 2015 [place]. This information is not intended to replace advice given to you by your health care provider. Make sure you discuss any questions you have with your health care provider. Upper Respiratory Infection, Adult An upper respiratory infection (URI) is also known as the common cold. It is often caused by a type of germ (virus). Colds are easily spread (contagious). You can pass it to others by kissing, coughing, sneezing, or drinking out of the same glass. Usually, you get better in 1 or 2 weeks.  HOME CARE   Only take medicine as told by your doctor.  Use a warm mist humidifier or breathe in steam from a hot shower.  Drink enough water and fluids to keep your pee (urine) clear or pale yellow.  Get plenty of rest.  Return to work when your temperature is back to normal or as told by your doctor. You may use a face mask and wash your hands to stop your cold from spreading. GET HELP RIGHT AWAY IF:   After the first few days, you feel you are  getting worse.  You have questions about your medicine.  You have chills, shortness of breath, or brown or red spit (mucus).  You have yellow or brown snot (nasal discharge) or pain in the face, especially when you bend forward.  You have a fever, puffy (swollen) neck, pain when you swallow, or white spots in the back of your throat.  You have a bad headache, ear pain, sinus pain, or chest pain.  You have a high-pitched whistling sound when you breathe in and out (wheezing).  You have a lasting cough or cough up blood.  You have sore muscles or a stiff neck. MAKE SURE YOU:   Understand these instructions.  Will watch your condition.  Will get help right away if you are not doing well or get worse. Document Released: 05/01/2008 Document Revised: 02/05/2012 Document Reviewed: 03/20/2011 [facility] Patient Information 2015 [place], [place]. This information is not intended to replace advice given to you by your health care provider. Make sure you discuss any questions you have with your health care provider.

## 2014-05-23 NOTE — ED Provider Notes (Signed)
Medical screening examination/treatment/procedure(s) were performed by non-physician practitioner and as supervising physician I was immediately available for consultation/collaboration.  Richarda Blade, MD 05/23/14 1539

## 2014-05-23 NOTE — ED Notes (Signed)
Pt left prior to discharge papers being received.

## 2014-05-24 LAB — CULTURE, GROUP A STREP

## 2014-06-30 LAB — PROCEDURE REPORT - SCANNED: Pap: NEGATIVE

## 2014-08-02 ENCOUNTER — Other Ambulatory Visit (HOSPITAL_COMMUNITY)
Admission: RE | Admit: 2014-08-02 | Discharge: 2014-08-02 | Disposition: A | Discharge status: Home / Self Care | Payer: Medicaid Other | Source: Ambulatory Visit | Attending: Emergency Medicine | Admitting: Emergency Medicine

## 2014-08-02 ENCOUNTER — Emergency Department (HOSPITAL_COMMUNITY)
Admission: EM | Admit: 2014-08-02 | Discharge: 2014-08-02 | Disposition: A | Discharge status: Home / Self Care | Payer: Medicaid Other | Source: Home / Self Care | Attending: Emergency Medicine | Admitting: Emergency Medicine

## 2014-08-02 DIAGNOSIS — N760 Acute vaginitis: Secondary | ICD-10-CM | POA: Diagnosis present

## 2014-08-02 DIAGNOSIS — Z113 Encounter for screening for infections with a predominantly sexual mode of transmission: Secondary | ICD-10-CM | POA: Diagnosis present

## 2014-08-02 DIAGNOSIS — N730 Acute parametritis and pelvic cellulitis: Secondary | ICD-10-CM

## 2014-08-02 LAB — POCT URINALYSIS DIP (DEVICE)
BILIRUBIN URINE: NEGATIVE
GLUCOSE, UA: NEGATIVE mg/dL
Ketones, ur: 15 mg/dL — AB
LEUKOCYTES UA: NEGATIVE
NITRITE: NEGATIVE
PH: 5.5 (ref 5.0–8.0)
Protein, ur: 30 mg/dL — AB
Specific Gravity, Urine: >=1.03 (ref 1.005–1.030)
UROBILINOGEN UA: 0.2 mg/dL (ref 0.0–1.0)

## 2014-08-02 LAB — POCT PREGNANCY, URINE: Preg Test, Ur: NEGATIVE

## 2014-08-02 MED ORDER — CEFTRIAXONE SODIUM 250 MG IJ SOLR
250.0000 mg | Freq: Once | INTRAMUSCULAR | Status: AC
  Administered 2014-08-02: 250 mg via INTRAMUSCULAR

## 2014-08-02 MED ORDER — IBUPROFEN 800 MG PO TABS
ORAL_TABLET | ORAL | Status: AC
  Filled 2014-08-02: qty 1

## 2014-08-02 MED ORDER — DOXYCYCLINE HYCLATE 100 MG PO TABS: 100.0000 mg | ORAL_TABLET | Freq: Two times a day (BID) | ORAL | Status: DC

## 2014-08-02 MED ORDER — LIDOCAINE HCL (PF) 1 % IJ SOLN
INTRAMUSCULAR | Status: AC
  Filled 2014-08-02: qty 5

## 2014-08-02 MED ORDER — AZITHROMYCIN 250 MG PO TABS
ORAL_TABLET | ORAL | Status: AC
  Filled 2014-08-02: qty 4

## 2014-08-02 MED ORDER — IBUPROFEN 800 MG PO TABS
800.0000 mg | ORAL_TABLET | Freq: Once | ORAL | Status: AC
  Administered 2014-08-02: 800 mg via ORAL

## 2014-08-02 MED ORDER — ONDANSETRON 8 MG PO TBDP: 8.0000 mg | ORAL_TABLET | Freq: Three times a day (TID) | ORAL | Status: DC | PRN

## 2014-08-02 MED ORDER — AZITHROMYCIN 250 MG PO TABS
1000.0000 mg | ORAL_TABLET | Freq: Once | ORAL | Status: AC
  Administered 2014-08-02: 1000 mg via ORAL

## 2014-08-02 MED ORDER — HYDROCODONE-ACETAMINOPHEN 5-325 MG PO TABS: ORAL_TABLET | ORAL | Status: DC

## 2014-08-02 MED ORDER — CEFTRIAXONE SODIUM 250 MG IJ SOLR
INTRAMUSCULAR | Status: AC
  Filled 2014-08-02: qty 250

## 2014-08-02 NOTE — ED Notes (Signed)
C/o abdominal pain since Thursday, sx include nausea and vomiting, last got sick yesterday, denies fever. Pain in middle of abdomen and left side, also c/o lower back pain.

## 2014-08-02 NOTE — Discharge Instructions (Signed)
Pelvic Inflammatory Disease Pelvic inflammatory disease (PID) refers to an infection in some or all of the female organs. The infection can be in the uterus, ovaries, fallopian tubes, or the surrounding tissues in the pelvis. PID can cause abdominal or pelvic pain that comes on suddenly (acute pelvic pain). PID is a serious infection because it can lead to lasting (chronic) pelvic pain or the inability to have children (infertile).  CAUSES  The infection is often caused by the normal bacteria found in the vaginal tissues. PID may also be caused by an infection that is spread during sexual contact. PID can also occur following:   The birth of a baby.   A miscarriage.   An abortion.   Major pelvic surgery.   The use of an intrauterine device (IUD).   A sexual assault.  RISK FACTORS Certain factors can put a person at higher risk for PID, such as:  Being younger than 25 years.  Being sexually active at [place] age.  Usingnonbarrier contraception.  Havingmultiple sexual partners.  Having sex with someone who has symptoms of a genital infection.  Using oral contraception. Other times, certain behaviors can increase the possibility of getting PID, such as:  Having sex during your period.  Using a vaginal douche.  Having an intrauterine device (IUD) in place. SYMPTOMS   Abdominal or pelvic pain.   Fever.   Chills.   Abnormal vaginal discharge.  Abnormal uterine bleeding.   Unusual pain shortly after finishing your period. DIAGNOSIS  Your caregiver will choose some of the following methods to make a diagnosis, such as:   Performinga physical exam and history. A pelvic exam typically reveals a very tender uterus and surrounding pelvis.   Ordering laboratory tests including a pregnancy test, blood tests, and urine test.  Orderingcultures of the vagina and cervix to check for a sexually transmitted infection (STI).  Performing an ultrasound.    Performing a laparoscopic procedure to look inside the pelvis.  TREATMENT   Antibiotic medicines may be prescribed and taken by mouth.   Sexual partners may be treated when the infection is caused by a sexually transmitted disease (STD).   Hospitalization may be needed to give antibiotics intravenously.  Surgery may be needed, but this is rare. It may take weeks until you are completely well. If you are diagnosed with PID, you should also be checked for human immunodeficiency virus (HIV). HOME CARE INSTRUCTIONS   If given, take your antibiotics as directed. Finish the medicine even if you start to feel better.   Only take over-the-counter or prescription medicines for pain, discomfort, or fever as directed by your caregiver.   Do not have sexual intercourse until treatment is completed or as directed by your caregiver. If PID is confirmed, your recent sexual partner(s) will need treatment.   Keep your follow-up appointments. SEEK MEDICAL CARE IF:   You have increased or abnormal vaginal discharge.   You need prescription medicine for your pain.   You vomit.   You cannot take your medicines.   Your partner has an STD.  SEEK IMMEDIATE MEDICAL CARE IF:   You have a fever.   You have increased abdominal or pelvic pain.   You have chills.   You have pain when you urinate.   You are not better after 72 hours following treatment.  MAKE SURE YOU:   Understand these instructions.  Will watch your condition.  Will get help right away if you are not doing well or get worse.  Document Released: 11/13/2005 Document Revised: 03/10/2013 Document Reviewed: 11/09/2011 [facility] Patient Information 2015 [place], [place]. This information is not intended to replace advice given to you by your health care provider. Make sure you discuss any questions you have with your health care provider.

## 2014-08-02 NOTE — ED Provider Notes (Signed)
Chief Complaint   Chief Complaint  Patient presents with  . Abdominal Pain    History of Present Illness   Connie Jenkins is a 33 year old female who has had a one-month history of crampy lower abdominal pain, worse for the past 4 days. This is crampy and feels like a labor pain. Is rated 7/10 at worst in right now is a 7/10. It's localized to the lower abdomen, it is worse on the left on the right. It's been associated with nausea and she vomited twice. She hasn't had much of an appetite. She notes some urinary frequency, and some lower back pain. The patient's last normal period it was July 16. Last month she had some bleeding on the fourth and the 29th. This was very heavy and dark. She is sexually active without use of birth control. She denies any fever, chills, or urinary symptoms. She's had no vaginal itching or discharge.  Review of Systems   Other than as noted above, the patient denies any of the following symptoms: Constitutional:  No fever, chills, weight loss or anorexia. Abdomen:  No nausea, vomiting, hematememesis, melena, diarrhea, or hematochezia. GU:  No dysuria, frequency, urgency, or hematuria. Gyn:  No vaginal discharge, itching, abnormal bleeding, dyspareunia, or pelvic pain.  [place]   Past medical history, family history, social history, meds, and allergies were reviewed. She is allergic to penicillin, Flagyl, and latex. No prior history of STDs or GYN problems.  Physical Exam     Vital signs:  BP 105/74  Pulse 69  Temp(Src) 98.1 F (36.7 C) (Oral)  Resp 14  SpO2 98%  LMP 07/25/2014 Gen:  Alert, oriented, in no distress. Lungs:  Breath sounds clear and equal bilaterally.  No wheezes, rales or rhonchi. Heart:  Regular rhythm.  No gallops or murmers.   Abdomen:  Soft, flat, nondistended. No organomegaly or mass. Bowel sounds are normally active. No tenderness, guarding, or rebound. Pelvic:  Has a moderate amount of white, clumpy discharge. Vaginal and  cervical mucosa were otherwise normal. She has mild pain on cervical motion. Uterus is normal in size and shape and nontender. She has mild adnexal tenderness on the right and no mass in moderate adnexal tenderness on the left without a mass.  DNA probes for gonorrhea, Chlamydia, Trichomonas, Gardnerella, and Candida were obtained. Skin:  Clear, warm and dry.  No rash.  Labs   Results for orders placed during the hospital encounter of 08/02/14  POCT URINALYSIS DIP (DEVICE)      Result Value Ref Range   Glucose, UA NEGATIVE  NEGATIVE mg/dL   Bilirubin Urine NEGATIVE  NEGATIVE   Ketones, ur 15 (*) NEGATIVE mg/dL   Specific Gravity, Urine >=1.030  1.005 - 1.030   Hgb urine dipstick MODERATE (*) NEGATIVE   pH 5.5  5.0 - 8.0   Protein, ur 30 (*) NEGATIVE mg/dL   Urobilinogen, UA 0.2  0.0 - 1.0 mg/dL   Nitrite NEGATIVE  NEGATIVE   Leukocytes, UA NEGATIVE  NEGATIVE  POCT PREGNANCY, URINE      Result Value Ref Range   Preg Test, Ur NEGATIVE  NEGATIVE   The urine was cultured.  Course in [facility]gent [place]   The following medications were given:  Medications  cefTRIAXone (ROCEPHIN) injection 250 mg (not administered)  azithromycin (ZITHROMAX) tablet 1,000 mg (not administered)  ibuprofen (ADVIL,MOTRIN) tablet 800 mg (not administered)   Assessment   The encounter diagnosis was PID (acute pelvic inflammatory disease).  There is no evidence  of pregnancy-related pain. Differential diagnosis also includes fibroid uterus or ovarian cyst.  Plan     1.  Meds:  The following meds were prescribed:   New Prescriptions   DOXYCYCLINE (VIBRA-TABS) 100 MG TABLET    Take 1 tablet (100 mg total) by mouth 2 (two) times daily.   HYDROCODONE-ACETAMINOPHEN (NORCO/VICODIN) 5-325 MG PER TABLET    1 to 2 tabs every 4 to 6 hours as needed for pain.   ONDANSETRON (ZOFRAN ODT) 8 MG DISINTEGRATING TABLET    Take 1 tablet (8 mg total) by mouth every 8 (eight) hours as needed for nausea.    2.  Patient  Education/Counseling:  The patient was given appropriate handouts, self care instructions, and instructed in symptomatic relief.  Suggested rest and avoidance of intercourse for the next 3-4 days.  3.  Follow up:  The patient was told to follow up here if no better in 3 to 4 days, or sooner if becoming worse in any way, and given some red flag symptoms such as worsening pain, fever, vomiting, or evidence of GI bleeding which would prompt immediate return. Followup at [facility] hospital clinics if no better in one week.    Harden Mo, MD 08/02/14 1027

## 2014-08-03 LAB — URINE CULTURE

## 2014-08-05 ENCOUNTER — Telehealth (HOSPITAL_COMMUNITY): Payer: Self-pay | Admitting: Emergency Medicine

## 2014-08-05 MED ORDER — FLUCONAZOLE 150 MG PO TABS: 150.0000 mg | ORAL_TABLET | Freq: Once | ORAL | Status: DC

## 2014-08-05 NOTE — ED Notes (Signed)
Patient's DNA probe came back positive for Candida. She was treated for PID, but did not receive any treatment for yeast. A prescription will be sent in to her pharmacy, [facility] on [address] for Diflucan 150 mg, 1 tablet, one time only. We will need to call the patient and inform her of this result.  Harden Mo, MD 08/05/14 [phone]

## 2014-08-05 NOTE — ED Notes (Signed)
I called pt. Pt. verified x 2 and given results.  Pt. Told she needs Diflucand for her yeast infection and where to pick up her Rx.  Pt. voiced understanding. Connie Jenkins 08/05/2014

## 2014-08-05 NOTE — ED Notes (Signed)
Opened in error  Harden Mo, MD 08/05/14 [phone]

## 2014-08-24 ENCOUNTER — Emergency Department (HOSPITAL_COMMUNITY)
Admission: EM | Admit: 2014-08-24 | Discharge: 2014-08-24 | Disposition: A | Discharge status: Home / Self Care | Payer: Medicaid Other | Attending: Emergency Medicine | Admitting: Emergency Medicine

## 2014-08-24 ENCOUNTER — Emergency Department (HOSPITAL_COMMUNITY): Payer: Medicaid Other

## 2014-08-24 ENCOUNTER — Encounter (HOSPITAL_COMMUNITY): Payer: Self-pay | Admitting: Emergency Medicine

## 2014-08-24 ENCOUNTER — Encounter (HOSPITAL_COMMUNITY): Payer: Self-pay

## 2014-08-24 ENCOUNTER — Inpatient Hospital Stay (EMERGENCY_DEPARTMENT_HOSPITAL)
Admission: AD | Admit: 2014-08-24 | Discharge: 2014-08-24 | Disposition: A | Discharge status: Home / Self Care | Payer: Medicaid Other | Source: Ambulatory Visit | Attending: Obstetrics | Admitting: Obstetrics

## 2014-08-24 DIAGNOSIS — Z792 Long term (current) use of antibiotics: Secondary | ICD-10-CM | POA: Insufficient documentation

## 2014-08-24 DIAGNOSIS — J45909 Unspecified asthma, uncomplicated: Secondary | ICD-10-CM | POA: Insufficient documentation

## 2014-08-24 DIAGNOSIS — Z8619 Personal history of other infectious and parasitic diseases: Secondary | ICD-10-CM | POA: Insufficient documentation

## 2014-08-24 DIAGNOSIS — Z880 Allergy status to penicillin: Secondary | ICD-10-CM | POA: Insufficient documentation

## 2014-08-24 DIAGNOSIS — R079 Chest pain, unspecified: Secondary | ICD-10-CM | POA: Insufficient documentation

## 2014-08-24 DIAGNOSIS — R52 Pain, unspecified: Secondary | ICD-10-CM

## 2014-08-24 DIAGNOSIS — R109 Unspecified abdominal pain: Secondary | ICD-10-CM | POA: Insufficient documentation

## 2014-08-24 DIAGNOSIS — Z8659 Personal history of other mental and behavioral disorders: Secondary | ICD-10-CM | POA: Insufficient documentation

## 2014-08-24 DIAGNOSIS — R1033 Periumbilical pain: Secondary | ICD-10-CM

## 2014-08-24 DIAGNOSIS — Z91040 Latex allergy status: Secondary | ICD-10-CM | POA: Diagnosis not present

## 2014-08-24 DIAGNOSIS — N200 Calculus of kidney: Secondary | ICD-10-CM | POA: Diagnosis not present

## 2014-08-24 LAB — COMPREHENSIVE METABOLIC PANEL
ALBUMIN: 3.5 g/dL (ref 3.5–5.2)
ALT: 39 U/L — AB (ref 0–35)
AST: 60 U/L — AB (ref 0–37)
Alkaline Phosphatase: 56 U/L (ref 39–117)
Anion gap: 15 (ref 5–15)
BILIRUBIN TOTAL: 0.3 mg/dL (ref 0.3–1.2)
BUN: 13 mg/dL (ref 6–23)
CO2: 23 meq/L (ref 19–32)
CREATININE: 0.82 mg/dL (ref 0.50–1.10)
Calcium: 8.8 mg/dL (ref 8.4–10.5)
Chloride: 102 mEq/L (ref 96–112)
GFR calc Af Amer: >90 mL/min (ref >90)
GFR calc non Af Amer: >90 mL/min (ref >90)
Glucose, Bld: 153 mg/dL — ABNORMAL HIGH (ref 70–99)
POTASSIUM: 3.6 meq/L — AB (ref 3.7–5.3)
Sodium: 140 mEq/L (ref 137–147)
Total Protein: 6.7 g/dL (ref 6.0–8.3)

## 2014-08-24 LAB — POCT PREGNANCY, URINE: Preg Test, Ur: NEGATIVE

## 2014-08-24 LAB — WET PREP, GENITAL
CLUE CELLS WET PREP: NONE SEEN
Trich, Wet Prep: NONE SEEN
Yeast Wet Prep HPF POC: NONE SEEN

## 2014-08-24 LAB — I-STAT TROPONIN, ED: TROPONIN I, POC: 0 ng/mL (ref 0.00–0.08)

## 2014-08-24 LAB — URINE MICROSCOPIC-ADD ON

## 2014-08-24 LAB — CBC
HCT: 38.4 % (ref 36.0–46.0)
Hemoglobin: 12.4 g/dL (ref 12.0–15.0)
MCH: 27.4 pg (ref 26.0–34.0)
MCHC: 32.3 g/dL (ref 30.0–36.0)
MCV: 85 fL (ref 78.0–100.0)
PLATELETS: 270 10*3/uL (ref 150–400)
RBC: 4.52 MIL/uL (ref 3.87–5.11)
RDW: 13.5 % (ref 11.5–15.5)
WBC: 9.7 10*3/uL (ref 4.0–10.5)

## 2014-08-24 LAB — URINALYSIS, ROUTINE W REFLEX MICROSCOPIC
BILIRUBIN URINE: NEGATIVE
Glucose, UA: NEGATIVE mg/dL
Ketones, ur: NEGATIVE mg/dL
Leukocytes, UA: NEGATIVE
Nitrite: NEGATIVE
PH: 6 (ref 5.0–8.0)
Protein, ur: NEGATIVE mg/dL
Urobilinogen, UA: 0.2 mg/dL (ref 0.0–1.0)

## 2014-08-24 LAB — HIV ANTIBODY (ROUTINE TESTING W REFLEX): HIV: NONREACTIVE

## 2014-08-24 MED ORDER — OXYCODONE-ACETAMINOPHEN 5-325 MG PO TABS: 1.0000 | ORAL_TABLET | Freq: Four times a day (QID) | ORAL | Status: DC | PRN

## 2014-08-24 MED ORDER — ONDANSETRON HCL 4 MG/2ML IJ SOLN
4.0000 mg | Freq: Once | INTRAMUSCULAR | Status: AC
  Administered 2014-08-24: 4 mg via INTRAVENOUS
  Filled 2014-08-24: qty 2

## 2014-08-24 MED ORDER — HYDROMORPHONE HCL 1 MG/ML IJ SOLN
1.0000 mg | Freq: Once | INTRAMUSCULAR | Status: AC
  Administered 2014-08-24: 1 mg via INTRAVENOUS
  Filled 2014-08-24: qty 1

## 2014-08-24 MED ORDER — PROMETHAZINE HCL 25 MG PO TABS
25.0000 mg | ORAL_TABLET | Freq: Once | ORAL | Status: DC
  Filled 2014-08-24: qty 1

## 2014-08-24 MED ORDER — TAMSULOSIN HCL 0.4 MG PO CAPS: 0.4000 mg | ORAL_CAPSULE | Freq: Every day | ORAL | Status: DC

## 2014-08-24 MED ORDER — SODIUM CHLORIDE 0.9 % IV SOLN
Freq: Once | INTRAVENOUS | Status: AC
  Administered 2014-08-24: 21:00:00 via INTRAVENOUS

## 2014-08-24 MED ORDER — KETOROLAC TROMETHAMINE 30 MG/ML IJ SOLN
30.0000 mg | Freq: Once | INTRAMUSCULAR | Status: AC
  Administered 2014-08-24: 30 mg via INTRAVENOUS
  Filled 2014-08-24: qty 1

## 2014-08-24 MED ORDER — PROMETHAZINE HCL 25 MG PO TABS: 25.0000 mg | ORAL_TABLET | Freq: Once | ORAL | Status: DC

## 2014-08-24 MED ORDER — PROMETHAZINE HCL 25 MG/ML IJ SOLN
12.5000 mg | Freq: Once | INTRAMUSCULAR | Status: AC
  Administered 2014-08-24: 12.5 mg via INTRAVENOUS
  Filled 2014-08-24: qty 1

## 2014-08-24 MED ORDER — PROMETHAZINE HCL 25 MG PO TABS: 25.0000 mg | ORAL_TABLET | Freq: Three times a day (TID) | ORAL | Status: DC | PRN

## 2014-08-24 MED ORDER — KETOROLAC TROMETHAMINE 60 MG/2ML IM SOLN
60.0000 mg | Freq: Once | INTRAMUSCULAR | Status: AC
  Administered 2014-08-24: 60 mg via INTRAMUSCULAR
  Filled 2014-08-24: qty 2

## 2014-08-24 MED ORDER — HYDROMORPHONE HCL 1 MG/ML IJ SOLN
1.0000 mg | Freq: Once | INTRAMUSCULAR | Status: DC
  Filled 2014-08-24: qty 1

## 2014-08-24 MED ORDER — OXYCODONE-ACETAMINOPHEN 5-325 MG PO TABS
1.0000 | ORAL_TABLET | Freq: Once | ORAL | Status: AC
  Administered 2014-08-24: 1 via ORAL
  Filled 2014-08-24: qty 1

## 2014-08-24 NOTE — ED Notes (Signed)
Pt vomited sandwich into emesis bag; verbal Zofran order given by PA

## 2014-08-24 NOTE — Discharge Instructions (Signed)
Food Choices to Help Relieve Diarrhea °When you have diarrhea, the foods you eat and your eating habits are very important. Choosing the right foods and drinks can help relieve diarrhea. Also, because diarrhea can last up to 7 days, you need to replace lost fluids and electrolytes (such as sodium, potassium, and chloride) in order to help prevent dehydration.  °WHAT GENERAL GUIDELINES DO I NEED TO FOLLOW? °· Slowly drink 1 cup (8 oz) of fluid for each episode of diarrhea. If you are getting enough fluid, your urine will be clear or pale yellow. °· Eat starchy foods. Some good choices include white rice, white toast, pasta, low-fiber cereal, baked potatoes (without the skin), saltine crackers, and bagels. °· Avoid large servings of any cooked vegetables. °· Limit fruit to two servings per day. A serving is ½ cup or 1 small piece. °· Choose foods with less than 2 g of fiber per serving. °· Limit fats to less than 8 tsp (38 g) per day. °· Avoid fried foods. °· Eat foods that have probiotics in them. Probiotics can be found in certain dairy products. °· Avoid foods and beverages that may increase the speed at which food moves through the stomach and intestines (gastrointestinal tract). Things to avoid include: °¨ High-fiber foods, such as dried fruit, raw fruits and vegetables, nuts, seeds, and whole grain foods. °¨ Spicy foods and high-fat foods. °¨ Foods and beverages sweetened with high-fructose corn syrup, honey, or sugar alcohols such as xylitol, sorbitol, and mannitol. °WHAT FOODS ARE RECOMMENDED? °Grains °White rice. White, French, or pita breads (fresh or toasted), including plain rolls, buns, or bagels. White pasta. Saltine, soda, or graham crackers. Pretzels. Low-fiber cereal. Cooked cereals made with water (such as cornmeal, farina, or cream cereals). Plain muffins. Matzo. Melba toast. Zwieback.  °Vegetables °Potatoes (without the skin). Strained tomato and vegetable juices. Most well-cooked and canned  vegetables without seeds. Tender lettuce. °Fruits °Cooked or canned applesauce, apricots, cherries, fruit cocktail, grapefruit, peaches, pears, or plums. Fresh bananas, apples without skin, cherries, grapes, cantaloupe, grapefruit, peaches, oranges, or plums.  °Meat and Other Protein Products °Baked or boiled chicken. Eggs. Tofu. Fish. Seafood. Smooth peanut butter. Ground or well-cooked tender beef, ham, veal, lamb, pork, or poultry.  °Dairy °Plain yogurt, kefir, and unsweetened liquid yogurt. Lactose-free milk, buttermilk, or soy milk. Plain hard cheese. °Beverages °Sport drinks. Clear broths. Diluted fruit juices (except prune). Regular, caffeine-free sodas such as ginger ale. Water. Decaffeinated teas. Oral rehydration solutions. Sugar-free beverages not sweetened with sugar alcohols. °Other °Bouillon, broth, or soups made from recommended foods.  °The items listed above may not be a complete list of recommended foods or beverages. Contact your dietitian for more options. °WHAT FOODS ARE NOT RECOMMENDED? °Grains °Whole grain, whole wheat, bran, or rye breads, rolls, pastas, crackers, and cereals. Wild or brown rice. Cereals that contain more than 2 g of fiber per serving. Corn tortillas or taco shells. Cooked or dry oatmeal. Granola. Popcorn. °Vegetables °Raw vegetables. Cabbage, broccoli, Brussels sprouts, artichokes, baked beans, beet greens, corn, kale, legumes, peas, sweet potatoes, and yams. Potato skins. Cooked spinach and cabbage. °Fruits °Dried fruit, including raisins and dates. Raw fruits. Stewed or dried prunes. Fresh apples with skin, apricots, mangoes, pears, raspberries, and strawberries.  °Meat and Other Protein Products °Chunky peanut butter. Nuts and seeds. Beans and lentils. Bacon.  °Dairy °High-fat cheeses. Milk, chocolate milk, and beverages made with milk, such as milk shakes. Cream. Ice cream. °Sweets and Desserts °Sweet rolls, doughnuts, and sweet breads. Pancakes   and waffles. °Fats and  Oils °Butter. Cream sauces. Margarine. Salad oils. Plain salad dressings. Olives. Avocados.  °Beverages °Caffeinated beverages (such as coffee, tea, soda, or energy drinks). Alcoholic beverages. Fruit juices with pulp. Prune juice. Soft drinks sweetened with high-fructose corn syrup or sugar alcohols. °Other °Coconut. Hot sauce. Chili powder. Mayonnaise. Gravy. Cream-based or milk-based soups.  °The items listed above may not be a complete list of foods and beverages to avoid. Contact your dietitian for more information. °WHAT SHOULD I DO IF I BECOME DEHYDRATED? °Diarrhea can sometimes lead to dehydration. Signs of dehydration include dark urine and dry mouth and skin. If you think you are dehydrated, you should rehydrate with an oral rehydration solution. These solutions can be purchased at pharmacies, retail stores, or online.  °Drink ½-1 cup (120-240 mL) of oral rehydration solution each time you have an episode of diarrhea. If drinking this amount makes your diarrhea worse, try drinking smaller amounts more often. For example, drink 1-3 tsp (5-15 mL) every 5-10 minutes.  °A general rule for staying hydrated is to drink 1½-2 L of fluid per day. Talk to your health care provider about the specific amount you should be drinking each day. Drink enough fluids to keep your urine clear or pale yellow. °Document Released: 02/03/2004 Document Revised: 11/18/2013 Document Reviewed: 10/06/2013 °Exit[employer]® Patient Information ©2015 [employer]. This information is not intended to replace advice given to you by your health care provider. Make sure you discuss any questions you have with your health care provider. °Viral Gastroenteritis °Viral gastroenteritis is also known as stomach flu. This condition affects the stomach and intestinal tract. It can cause sudden diarrhea and vomiting. The illness typically lasts 3 to 8 days. Most people develop an immune response that eventually gets rid of the virus. While this natural  response develops, the virus can make you quite ill. °CAUSES  °Many different viruses can cause gastroenteritis, such as rotavirus or noroviruses. You can catch one of these viruses by consuming contaminated food or water. You may also catch a virus by sharing utensils or other personal items with an infected person or by touching a contaminated surface. °SYMPTOMS  °The most common symptoms are diarrhea and vomiting. These problems can cause a severe loss of body fluids (dehydration) and a body salt (electrolyte) imbalance. Other symptoms may include: °· Fever. °· Headache. °· Fatigue. °· Abdominal pain. °DIAGNOSIS  °Your caregiver can usually diagnose viral gastroenteritis based on your symptoms and a physical exam. A stool sample may also be taken to test for the presence of viruses or other infections. °TREATMENT  °This illness typically goes away on its own. Treatments are aimed at rehydration. The most serious cases of viral gastroenteritis involve vomiting so severely that you are not able to keep fluids down. In these cases, fluids must be given through an intravenous line (IV). °HOME CARE INSTRUCTIONS  °· Drink enough fluids to keep your urine clear or pale yellow. Drink small amounts of fluids frequently and increase the amounts as tolerated. °· Ask your caregiver for specific rehydration instructions. °· Avoid: °¨ Foods high in sugar. °¨ Alcohol. °¨ Carbonated drinks. °¨ Tobacco. °¨ Juice. °¨ Caffeine drinks. °¨ Extremely hot or cold fluids. °¨ Fatty, greasy foods. °¨ Too much intake of anything at one time. °¨ Dairy products until 24 to 48 hours after diarrhea stops. °· You may consume probiotics. Probiotics are active cultures of beneficial bacteria. They may lessen the amount and number of diarrheal stools in adults. Probiotics   can be found in yogurt with active cultures and in supplements.  Wash your hands well to avoid spreading the virus.  Only take over-the-counter or prescription medicines for  pain, discomfort, or fever as directed by your caregiver. Do not give aspirin to children. Antidiarrheal medicines are not recommended.  Ask your caregiver if you should continue to take your regular prescribed and over-the-counter medicines.  Keep all follow-up appointments as directed by your caregiver. SEEK MEDICAL CARE IF:   You do not urinate at least once every 6 to 8 hours.  You develop shortness of breath.  You notice blood in your stool or vomit. This may look like coffee grounds.  You have persistent vomiting or diarrhea.  You have a fever.  MAKE SURE YOU:   Understand these instructions.  Will watch your condition.  Will get help right away if you are not doing well or get worse. Document Released: 11/13/2005 Document Revised: 02/05/2012 Document Reviewed: 08/30/2011 [facility] Patient Information 2015 [place], [place]. This information is not intended to replace advice given to you by your health care provider. Make sure you discuss any questions you have with your health care provider.

## 2014-08-24 NOTE — ED Notes (Signed)
Pt given Sprite and crackers per [place] PA

## 2014-08-24 NOTE — ED Provider Notes (Signed)
CSN: [id number]     Arrival date & time 08/24/14  1016 History   First MD Initiated Contact with Patient 08/24/14 1410     Chief Complaint  Patient presents with  . Chest Pain  . Back Pain  . Abdominal Pain     (Consider location/radiation/quality/duration/timing/severity/associated sxs/prior Treatment) HPI Connie Jenkins is a 33 y.o. female comes in for evaluation of abdominal pain. Patient states this morning at 4:00 AM she was awoken by a sharp, dull, crampy pain in her lower left abdomen that does not radiate. She reports 3 hours later at 7:00 AM she threw up "green stuff with streaks of blood in it". She also reports having [facility] to [facility] for this complaint, they did a pelvic exam and gave her a shot for pain and told her "your ovaries are good" and sent her here for further evaluation and to "check on my appendix". She also reports some shortness of breath when she gets up sometimes. Aggravating factors include palpation and movement. Patient states "it is difficult for me to find a comfortable position". She also reports some burning when she urinates, but no hematuria No abdominal surgeries. Last menstrual period started on September 20. She denies fevers, hematochezia, melena, diarrhea, constipation. No chest pain, numbness or weakness  Past Medical History  Diagnosis Date  . Febrile illness, acute 01/2011    Hospitalized for 6 days  . Herpes 2008  . Seasonal allergies   . Asthma     inhaler rarely used - twice year  . Anxiety     no meds  . Hx of bronchitis 09/2012   Past Surgical History  Procedure Laterality Date  . Cesarean section  2000 & 2004    "1st one I dilated 1cm, then the 2nd one was a rpt  . Hernia repair    . Wisdom tooth extraction    . Tmj arthroplasty    . Hysteroscopy N/A 03/24/2013    Procedure: HYSTEROSCOPY / Dilitation and curettage/endometrial curettings;  Surgeon: Lavonia Drafts, MD;  Location: [facility];  Service:  Gynecology;  Laterality: N/A;  Diagnostic hysteroscopy   No family history on file. History  Substance Use Topics  . Smoking status: Never Smoker   . Smokeless tobacco: Never Used  . Alcohol Use: No   OB History   Grav Para Term Preterm Abortions TAB SAB Ect Mult Living   3 2 1 1 1  1   2      Review of Systems  Constitutional: Negative for fever.  HENT: Negative for sore throat.   Eyes: Negative for visual disturbance.  Respiratory: Positive for shortness of breath.   Cardiovascular: Negative for chest pain.  Gastrointestinal: Positive for nausea, vomiting and abdominal pain.  Endocrine: Negative for polyuria.  Genitourinary: Positive for dysuria and flank pain.  Skin: Negative for rash.  Neurological: Negative for headaches.      Allergies  Flagyl; Latex; and Penicillins  Home Medications   Prior to Admission medications   Medication Sig Start Date End Date Taking? Authorizing Provider  doxycycline (VIBRA-TABS) 100 MG tablet Take 1 tablet (100 mg total) by mouth 2 (two) times daily. 08/02/14   Harden Mo, MD  fluconazole (DIFLUCAN) 150 MG tablet Take 1 tablet (150 mg total) by mouth once. 08/05/14   Harden Mo, MD  promethazine (PHENERGAN) 25 MG tablet Take 1 tablet (25 mg total) by mouth once. 08/24/14   Mathis Bud, CNM   BP 118/62  Pulse  76  Temp(Src) 98.2 F (36.8 C) (Oral)  Resp 18  Ht 5\' 3"  (1.6 m)  Wt 196 lb (88.905 kg)  BMI 34.73 kg/m2  SpO2 100%  LMP 07/23/2014 Physical Exam  Nursing note and vitals reviewed. Constitutional: She is oriented to person, place, and time. She appears well-developed and well-nourished.  HENT:  Head: Normocephalic and atraumatic.  Mouth/Throat: Oropharynx is clear and moist.  Eyes: Conjunctivae are normal. Pupils are equal, round, and reactive to light. Right eye exhibits no discharge. Left eye exhibits no discharge. No scleral icterus.  Neck: Neck supple.  Cardiovascular: Normal rate, regular rhythm and  normal heart sounds.   Pulmonary/Chest: Effort normal and breath sounds normal. No respiratory distress. She has no wheezes. She has no rales.  Abdominal: Soft.  Mild tenderness to deep palpation in the lower left quadrant, left upper quadrant. No rebound, guarding or masses. Negative McBurney's. No other obvious lesions or rashes appreciated  Musculoskeletal: She exhibits no tenderness.  Patient is able to ambulate independently without any ataxia or antalgic gait  Neurological: She is alert and oriented to person, place, and time.  Cranial Nerves II-XII grossly intact  Skin: Skin is warm and dry. No rash noted.  Psychiatric: She has a normal mood and affect.    ED Course  Procedures (including critical care time) Labs Review Labs Reviewed  Randolm Idol, ED    Imaging Review Dg Chest 2 View  08/24/2014   CLINICAL DATA:  Left flank pain.  EXAM: CHEST  2 VIEW  COMPARISON:  05/22/2014  FINDINGS: The heart size and mediastinal contours are within normal limits. Both lungs are clear. The visualized skeletal structures are unremarkable.  IMPRESSION: No active cardiopulmonary disease.   Electronically Signed   By: Shon Hale M.D.   On: 08/24/2014 12:10     EKG Interpretation None     Meds given in ED:  Medications  oxyCODONE-acetaminophen (PERCOCET/ROXICET) 5-325 MG per tablet 1 tablet (1 tablet Oral Given 08/24/14 1339)    New Prescriptions   No medications on file   Filed Vitals:   08/24/14 1333  BP: 118/62  Pulse: 76  Temp:   Resp: 18    MDM  Vitals stable - WNL -afebrile Pt resting comfortably in ED. PE and clinical picture not consistent with appendicitis. Lab work done this morning at [facility] are significant for large hemoglobin in the urine as well as 21-50 red blood cells in the urine. CBC, CMP, urinalysis otherwise noncontributory. Pelvic exam done at MAU also not concerning at this time. Due to blood in the urine and the patient presentation, decision made to  obtain Renal US and OB US.  4:11 PM Care signed out to [facility], PA-C, pending [place] results.      Final diagnoses:  None        Verl Dicker, PA-C 08/24/14 1820

## 2014-08-24 NOTE — ED Notes (Addendum)
Pt continues to vomit after zofran administration, pain 9/10 following dilaudid. PA aware; phenergan ordered, waiting for medication from pharmacy

## 2014-08-24 NOTE — MAU Provider Note (Signed)
History     CSN: [id number]  Arrival date and time: 08/24/14 6160   First Provider Initiated Contact with Patient 08/24/14 [phone]      Chief Complaint  Patient presents with  . Abdominal Pain   HPI  Connie Jenkins is a 33 y.o. [id number] who presents today with sudden onset left sided abdominal pain. She states that the pain is at the level of the umbilicus on the left side. She reports that she had a BM when she arrived here, and it was softer than normal. She rates her pain 8/10. She denies any VB, and her LMP was 07/25/14 . She has not taken anything for the pain at this time. She has had one episode of emesis while here, but no other nausea or vomiting prior to that. She denies any fever.   Past Medical History  Diagnosis Date  . Febrile illness, acute 01/2011    Hospitalized for 6 days  . Herpes 2008  . Seasonal allergies   . Asthma     inhaler rarely used - twice year  . Anxiety     no meds  . Hx of bronchitis 09/2012    Past Surgical History  Procedure Laterality Date  . Cesarean section  2000 & 2004    "1st one I dilated 1cm, then the 2nd one was a rpt  . Hernia repair    . Wisdom tooth extraction    . Tmj arthroplasty    . Hysteroscopy N/A 03/24/2013    Procedure: HYSTEROSCOPY / Dilitation and curettage/endometrial curettings;  Surgeon: Lavonia Drafts, MD;  Location: [facility];  Service: Gynecology;  Laterality: N/A;  Diagnostic hysteroscopy    History reviewed. No pertinent family history.  History  Substance Use Topics  . Smoking status: Never Smoker   . Smokeless tobacco: Never Used  . Alcohol Use: No    Allergies:  Allergies  Allergen Reactions  . Flagyl [Metronidazole] Nausea And Vomiting  . Latex Itching  . Penicillins Hives and Other (See Comments)    Childhood allergy    Prescriptions prior to admission  Medication Sig Dispense Refill  . doxycycline (VIBRA-TABS) 100 MG tablet Take 1 tablet (100 mg total) by mouth 2 (two) times daily.  20  tablet  0  . fluconazole (DIFLUCAN) 150 MG tablet Take 1 tablet (150 mg total) by mouth once.  1 tablet  0    ROS Physical Exam   Blood pressure 121/64, pulse 76, temperature 97.8 F (36.6 C), temperature source Oral, resp. rate 20, last menstrual period 07/25/2014, SpO2 100.00%.  Physical Exam  Nursing note and vitals reviewed. Constitutional: She is oriented to person, place, and time. She appears well-developed and well-nourished. No distress.  Cardiovascular: Normal rate.   Respiratory: Effort normal.  GI: Soft. She exhibits no mass. There is no tenderness. There is no rebound and no guarding.  Genitourinary:   External: no lesion Vagina: small amount of white discharge Cervix: pink, smooth, no CMT Uterus: NSSC Adnexa: NT   Neurological: She is alert and oriented to person, place, and time.  Skin: Skin is warm and dry.  Psychiatric: She has a normal mood and affect.    MAU Course  Procedures  Results for orders placed during the hospital encounter of 08/24/14 (from the past 24 hour(s))  URINALYSIS, ROUTINE W REFLEX MICROSCOPIC     Status: Abnormal   Collection Time    08/24/14  5:30 AM      Result Value Ref Range   Color,  Urine YELLOW  YELLOW   APPearance CLOUDY (*) CLEAR   Specific Gravity, Urine >1.030 (*) 1.005 - 1.030   pH 6.0  5.0 - 8.0   Glucose, UA NEGATIVE  NEGATIVE mg/dL   Hgb urine dipstick LARGE (*) NEGATIVE   Bilirubin Urine NEGATIVE  NEGATIVE   Ketones, ur NEGATIVE  NEGATIVE mg/dL   Protein, ur NEGATIVE  NEGATIVE mg/dL   Urobilinogen, UA 0.2  0.0 - 1.0 mg/dL   Nitrite NEGATIVE  NEGATIVE   Leukocytes, UA NEGATIVE  NEGATIVE  URINE MICROSCOPIC-ADD ON     Status: Abnormal   Collection Time    08/24/14  5:30 AM      Result Value Ref Range   Squamous Epithelial / LPF FEW (*) RARE   WBC, UA 0-2  <3 WBC/hpf   RBC / HPF 21-50  <3 RBC/hpf   Bacteria, UA RARE  RARE   Urine-Other MUCOUS PRESENT    POCT PREGNANCY, URINE     Status: None   Collection Time     08/24/14  5:52 AM      Result Value Ref Range   Preg Test, Ur NEGATIVE  NEGATIVE  WET PREP, GENITAL     Status: Abnormal   Collection Time    08/24/14  6:25 AM      Result Value Ref Range   Yeast Wet Prep HPF POC NONE SEEN  NONE SEEN   Trich, Wet Prep NONE SEEN  NONE SEEN   Clue Cells Wet Prep HPF POC NONE SEEN  NONE SEEN   WBC, Wet Prep HPF POC FEW (*) NONE SEEN  CBC     Status: None   Collection Time    08/24/14  6:26 AM      Result Value Ref Range   WBC 9.7  4.0 - 10.5 K/uL   RBC 4.52  3.87 - 5.11 MIL/uL   Hemoglobin 12.4  12.0 - 15.0 g/dL   HCT 38.4  36.0 - 46.0 %   MCV 85.0  78.0 - 100.0 fL   MCH 27.4  26.0 - 34.0 pg   MCHC 32.3  30.0 - 36.0 g/dL   RDW 13.5  11.5 - 15.5 %   Platelets 270  150 - 400 K/uL  COMPREHENSIVE METABOLIC PANEL     Status: Abnormal   Collection Time    08/24/14  6:26 AM      Result Value Ref Range   Sodium 140  137 - 147 mEq/L   Potassium 3.6 (*) 3.7 - 5.3 mEq/L   Chloride 102  96 - 112 mEq/L   CO2 23  19 - 32 mEq/L   Glucose, Bld 153 (*) 70 - 99 mg/dL   BUN 13  6 - 23 mg/dL   Creatinine, Ser 0.82  0.50 - 1.10 mg/dL   Calcium 8.8  8.4 - 10.5 mg/dL   Total Protein 6.7  6.0 - 8.3 g/dL   Albumin 3.5  3.5 - 5.2 g/dL   AST 60 (*) 0 - 37 U/L   ALT 39 (*) 0 - 35 U/L   Alkaline Phosphatase 56  39 - 117 U/L   Total Bilirubin 0.3  0.3 - 1.2 mg/dL   GFR calc non Af Amer >90  >90 mL/min   GFR calc Af Amer >90  >90 mL/min   Anion gap 15  5 - 15   0720: Patient has had IM toradol. Pain is improved. She has refused phenergan for the nausea.  Assessment and Plan   1.  Abdominal pain, acute    Likely related to a virus or food consumption BRAT diet discussed Return precautions reviewed  GC/CT pending Urine culture pending  Return to MAU as needed FU with Dr. Gretchen Portela, Tamala Bari 08/24/2014, 6:19 AM

## 2014-08-24 NOTE — ED Notes (Signed)
Pt updated on wait time.  

## 2014-08-24 NOTE — ED Notes (Signed)
Pt lethargic on reassessment; reports nausea and pain 9/10. Spoke to PA; NS bolus to be given then reassess

## 2014-08-24 NOTE — ED Notes (Signed)
Pt reports pain is 7/10 and can't get prescriptions fill tonight. PA aware

## 2014-08-24 NOTE — ED Notes (Addendum)
Pt states she woke from sleep this morning with central CP that radiates to her back.  Pt also c/o abdominal pain.  Pt states she presented to [facility] b/c she thought she had a cyst.  Pt was sent here for eval.  Pt did have some labs drawn at [facility] this morning.

## 2014-08-24 NOTE — Discharge Instructions (Signed)
Return here as needed. Your pain is most likely from a kidney stone. Follow up with the urologist provided

## 2014-08-24 NOTE — MAU Note (Signed)
Sudden onset of LLQ pain this morning around 5am that woke her up. Denies vaginal bleeding or discharge. LMP 8/29. No birth control. Some nausea since pain started. Denies vomiting/diarrhea/constipation.

## 2014-08-25 LAB — GC/CHLAMYDIA PROBE AMP
CT PROBE, AMP APTIMA: NEGATIVE
GC PROBE AMP APTIMA: NEGATIVE

## 2014-08-25 NOTE — ED Provider Notes (Signed)
Medical screening examination/treatment/procedure(s) were conducted as a shared visit with non-physician practitioner(s) and myself.  I personally evaluated the patient during the encounter.   EKG Interpretation None      Patient here with LLQ pain. Seen at [facility], sent home after toradol - reported to have benign exam. Patient states severe pain with pelvic exam - palpation of L adnexa. No hx of kidney stone. Hematuria on UA, other labs ok, not pregnant. Ordered US of pelvis and kidney - ovarian cyst/torsion vs stone.  Evelina Bucy, MD 08/25/14 [phone]

## 2014-08-26 ENCOUNTER — Other Ambulatory Visit: Payer: Self-pay | Admitting: Urology

## 2014-08-26 ENCOUNTER — Encounter (HOSPITAL_COMMUNITY): Payer: Medicaid Other | Admitting: Anesthesiology

## 2014-08-26 ENCOUNTER — Ambulatory Visit (HOSPITAL_COMMUNITY): Payer: Medicaid Other | Admitting: Anesthesiology

## 2014-08-26 ENCOUNTER — Encounter (HOSPITAL_COMMUNITY): Payer: Self-pay | Admitting: *Deleted

## 2014-08-26 ENCOUNTER — Encounter (HOSPITAL_COMMUNITY)
Admission: EM | Disposition: A | Discharge status: Home / Self Care | Payer: Self-pay | Source: Ambulatory Visit | Attending: Urology

## 2014-08-26 ENCOUNTER — Ambulatory Visit (HOSPITAL_COMMUNITY)
Admission: EM | Admit: 2014-08-26 | Discharge: 2014-08-26 | Disposition: A | Discharge status: Home / Self Care | Payer: Medicaid Other | Source: Ambulatory Visit | Attending: Urology | Admitting: Urology

## 2014-08-26 DIAGNOSIS — F411 Generalized anxiety disorder: Secondary | ICD-10-CM | POA: Diagnosis not present

## 2014-08-26 DIAGNOSIS — Z880 Allergy status to penicillin: Secondary | ICD-10-CM | POA: Diagnosis not present

## 2014-08-26 DIAGNOSIS — J45909 Unspecified asthma, uncomplicated: Secondary | ICD-10-CM | POA: Diagnosis not present

## 2014-08-26 DIAGNOSIS — Z91040 Latex allergy status: Secondary | ICD-10-CM | POA: Insufficient documentation

## 2014-08-26 DIAGNOSIS — Z8619 Personal history of other infectious and parasitic diseases: Secondary | ICD-10-CM | POA: Diagnosis not present

## 2014-08-26 DIAGNOSIS — Z883 Allergy status to other anti-infective agents status: Secondary | ICD-10-CM | POA: Insufficient documentation

## 2014-08-26 DIAGNOSIS — N200 Calculus of kidney: Secondary | ICD-10-CM

## 2014-08-26 DIAGNOSIS — N201 Calculus of ureter: Secondary | ICD-10-CM | POA: Insufficient documentation

## 2014-08-26 HISTORY — DX: Depression, unspecified: F32.A

## 2014-08-26 HISTORY — DX: Calculus of kidney: N20.0

## 2014-08-26 HISTORY — DX: Major depressive disorder, single episode, unspecified: F32.9

## 2014-08-26 HISTORY — PX: CYSTOSCOPY WITH RETROGRADE PYELOGRAM, URETEROSCOPY AND STENT PLACEMENT: SHX5789

## 2014-08-26 LAB — URINE CULTURE: Colony Count: 15000

## 2014-08-26 LAB — BASIC METABOLIC PANEL
ANION GAP: 10 (ref 5–15)
BUN: 14 mg/dL (ref 6–23)
CALCIUM: 8.9 mg/dL (ref 8.4–10.5)
CO2: 26 mEq/L (ref 19–32)
CREATININE: 1.38 mg/dL — AB (ref 0.50–1.10)
Chloride: 103 mEq/L (ref 96–112)
GFR calc non Af Amer: 50 mL/min — ABNORMAL LOW (ref >90)
GFR, EST AFRICAN AMERICAN: 58 mL/min — AB (ref >90)
Glucose, Bld: 85 mg/dL (ref 70–99)
Potassium: 4.2 mEq/L (ref 3.7–5.3)
Sodium: 139 mEq/L (ref 137–147)

## 2014-08-26 SURGERY — CYSTOURETEROSCOPY, WITH RETROGRADE PYELOGRAM AND STENT INSERTION
Anesthesia: General | Site: Ureter | Laterality: Left

## 2014-08-26 MED ORDER — ONDANSETRON HCL 4 MG/2ML IJ SOLN
INTRAMUSCULAR | Status: AC
  Filled 2014-08-26: qty 2

## 2014-08-26 MED ORDER — PROPOFOL 10 MG/ML IV BOLUS
INTRAVENOUS | Status: DC | PRN
  Administered 2014-08-26: 150 mg via INTRAVENOUS

## 2014-08-26 MED ORDER — MIDAZOLAM HCL 5 MG/5ML IJ SOLN
INTRAMUSCULAR | Status: DC | PRN
  Administered 2014-08-26: 2 mg via INTRAVENOUS

## 2014-08-26 MED ORDER — CIPROFLOXACIN IN D5W 400 MG/200ML IV SOLN
400.0000 mg | INTRAVENOUS | Status: AC
  Administered 2014-08-26: 400 mg via INTRAVENOUS

## 2014-08-26 MED ORDER — FENTANYL CITRATE 0.05 MG/ML IJ SOLN
INTRAMUSCULAR | Status: AC
  Filled 2014-08-26: qty 2

## 2014-08-26 MED ORDER — CIPROFLOXACIN IN D5W 400 MG/200ML IV SOLN
INTRAVENOUS | Status: AC
  Filled 2014-08-26: qty 200

## 2014-08-26 MED ORDER — SODIUM CHLORIDE 0.9 % IJ SOLN
INTRAMUSCULAR | Status: AC
  Filled 2014-08-26: qty 10

## 2014-08-26 MED ORDER — ONDANSETRON HCL 4 MG/2ML IJ SOLN
INTRAMUSCULAR | Status: DC | PRN
  Administered 2014-08-26: 4 mg via INTRAVENOUS

## 2014-08-26 MED ORDER — LACTATED RINGERS IV SOLN: INTRAVENOUS | Status: DC

## 2014-08-26 MED ORDER — FENTANYL CITRATE 0.05 MG/ML IJ SOLN: 25.0000 ug | INTRAMUSCULAR | Status: DC | PRN

## 2014-08-26 MED ORDER — FENTANYL CITRATE 0.05 MG/ML IJ SOLN
INTRAMUSCULAR | Status: DC | PRN
  Administered 2014-08-26 (×2): 50 ug via INTRAVENOUS

## 2014-08-26 MED ORDER — BELLADONNA ALKALOIDS-OPIUM 16.2-60 MG RE SUPP
RECTAL | Status: AC
  Filled 2014-08-26: qty 1

## 2014-08-26 MED ORDER — PROPOFOL 10 MG/ML IV BOLUS
INTRAVENOUS | Status: AC
  Filled 2014-08-26: qty 20

## 2014-08-26 MED ORDER — 0.9 % SODIUM CHLORIDE (POUR BTL) OPTIME
TOPICAL | Status: DC | PRN
  Administered 2014-08-26: 1000 mL

## 2014-08-26 MED ORDER — LACTATED RINGERS IV SOLN
INTRAVENOUS | Status: DC
  Administered 2014-08-26: 21:00:00 via INTRAVENOUS
  Administered 2014-08-26: 1000 mL via INTRAVENOUS

## 2014-08-26 MED ORDER — IOHEXOL 300 MG/ML  SOLN
INTRAMUSCULAR | Status: DC | PRN
  Administered 2014-08-26: 1.5 mL

## 2014-08-26 MED ORDER — MIDAZOLAM HCL 2 MG/2ML IJ SOLN
INTRAMUSCULAR | Status: AC
  Filled 2014-08-26: qty 2

## 2014-08-26 MED ORDER — OXYCODONE-ACETAMINOPHEN 5-325 MG PO TABS: 1.0000 | ORAL_TABLET | ORAL | Status: DC | PRN

## 2014-08-26 MED ORDER — LIDOCAINE HCL (CARDIAC) 20 MG/ML IV SOLN
INTRAVENOUS | Status: AC
  Filled 2014-08-26: qty 5

## 2014-08-26 MED ORDER — SODIUM CHLORIDE 0.9 % IR SOLN
Status: DC | PRN
  Administered 2014-08-26: 4000 mL

## 2014-08-26 SURGICAL SUPPLY — 15 items
BAG URO CATCHER STRL LF (DRAPE) ×2 IMPLANT
BASKET ZERO TIP NITINOL 2.4FR (BASKET) IMPLANT
BSKT STON RTRVL ZERO TP 2.4FR (BASKET)
CATH INTERMIT  6FR 70CM (CATHETERS) ×1 IMPLANT
CLOTH BEACON ORANGE TIMEOUT ST (SAFETY) ×2 IMPLANT
DRAPE CAMERA CLOSED 9X96 (DRAPES) ×2 IMPLANT
EXTRACTOR STONE NITINOL NGAGE (UROLOGICAL SUPPLIES) ×1 IMPLANT
FIBER LASER FLEXIVA 365 (UROLOGICAL SUPPLIES) ×1 IMPLANT
GLOVE BIOGEL M STRL SZ7.5 (GLOVE) ×1 IMPLANT
GOWN STRL REUS W/TWL LRG LVL3 (GOWN DISPOSABLE) ×4 IMPLANT
GUIDEWIRE ANG ZIPWIRE 038X150 (WIRE) IMPLANT
GUIDEWIRE STR DUAL SENSOR (WIRE) ×2 IMPLANT
MANIFOLD NEPTUNE II (INSTRUMENTS) ×2 IMPLANT
PACK CYSTO (CUSTOM PROCEDURE TRAY) ×2 IMPLANT
TUBING CONNECTING 10 (TUBING) ×2 IMPLANT

## 2014-08-26 NOTE — Anesthesia Postprocedure Evaluation (Signed)
  Anesthesia Post-op Note  Patient: Connie Jenkins  Procedure(s) Performed: Procedure(s) (LRB): CYSTOSCOPY WITH RETROGRADE PYELOGRAM, URETEROSCOPY, LASER, STONE EXTRACTION WITH BASKET (Left)  Patient Location: PACU  Anesthesia Type: General  Level of Consciousness: awake and alert   Airway and Oxygen Therapy: Patient Spontanous Breathing  Post-op Pain: mild  Post-op Assessment: Post-op Vital signs reviewed, Patient's Cardiovascular Status Stable, Respiratory Function Stable, Patent Airway and No signs of Nausea or vomiting  Last Vitals:  Filed Vitals:   08/26/14 2045  BP: 101/62  Pulse: 57  Temp:   Resp: 17    Post-op Vital Signs: stable   Complications: No apparent anesthesia complications

## 2014-08-26 NOTE — Op Note (Signed)
Preoperative diagnosis: Left ureteral calculus  Postoperative diagnosis: Left ureteral calculus  Procedure:  1. Cystoscopy 2. Left ureteroscopy and stone removal 3. Ureteroscopic laser lithotripsy 4. Left retrograde pyelography with interpretation  Surgeon: Pryor Curia. M.D.  Anesthesia: General  Complications: None  Intraoperative findings: Left retrograde pyelography demonstrated a filling defect within the distal left ureter consistent with the patient's known calculus without other abnormalities.  EBL: Minimal  Specimens: 1. Left ureteral calculus  Disposition of specimens: [facility] for stone analysis  Indication: Connie Jenkins is a 33 y.o. year old patient with urolithiasis and a distal left ureteral stone. After reviewing the management options for treatment, the patient elected to proceed with the above surgical procedure(s). We have discussed the potential benefits and risks of the procedure, side effects of the proposed treatment, the likelihood of the patient achieving the goals of the procedure, and any potential problems that might occur during the procedure or recuperation. Informed consent has been obtained.  Description of procedure:  The patient was taken to the operating room and general anesthesia was induced.  The patient was placed in the dorsal lithotomy position, prepped and draped in the usual sterile fashion, and preoperative antibiotics were administered. A preoperative time-out was performed.   Cystourethroscopy was performed.  The patient's urethra was examined and was normal. The bladder was then systematically examined in its entirety. There was no evidence for any bladder tumors, stones, or other mucosal pathology.    Attention then turned to the left ureteral orifice and a ureteral catheter was used to intubate the ureteral orifice.  Omnipaque contrast was injected through the ureteral catheter and a retrograde pyelogram  was performed with findings as dictated above.  A 0.38 sensor guidewire was then advanced up the left ureter into the renal pelvis under fluoroscopic guidance. The 6 Fr semirigid ureteroscope was then advanced into the ureter next to the guidewire and the calculus was identified.   The stone was then fragmented with the 365 micron holmium laser fiber on a setting of 0.6 J and frequency of 6 Hz.   All stones were then removed from the ureter with an N gauge basket.  Reinspection of the ureter revealed no remaining visible stones or fragments.  The wire was removed as it was felt no stent would be needed.  The bladder was then emptied and the procedure ended.  The patient appeared to tolerate the procedure well and without complications.  The patient was able to be awakened and transferred to the recovery unit in satisfactory condition.

## 2014-08-26 NOTE — Anesthesia Preprocedure Evaluation (Addendum)
Anesthesia Evaluation  Patient identified by MRN, date of birth, ID band Patient awake    Reviewed: Allergy & Precautions, H&P , Patient's Chart, lab work & pertinent test results  History of Anesthesia Complications Negative for: history of anesthetic complications  Airway Mallampati: II TM Distance: >3 FB Neck ROM: full    Dental no notable dental hx. (+) Teeth Intact, Dental Advisory Given   Pulmonary asthma ,  Mild asthma breath sounds clear to auscultation  Pulmonary exam normal       Cardiovascular Exercise Tolerance: Good negative cardio ROS  Rhythm:regular Rate:Normal  Sepsis. shock   Neuro/Psych Anxiety negative neurological ROS  negative psych ROS   GI/Hepatic negative GI ROS, Neg liver ROS,   Endo/Other  negative endocrine ROS  Renal/GU negative Renal ROS     Musculoskeletal   Abdominal   Peds  Hematology negative hematology ROS (+)   Anesthesia Other Findings Febrile illness, acute 01/2011 Hospitalized for 6 days Herpes 2008      Seasonal allergies     Asthma   inhaler rarely used - twice year    Anxiety   no meds Hx of bronchitis 09/2012    Reproductive/Obstetrics negative OB ROS                         Anesthesia Physical Anesthesia Plan  ASA: II  Anesthesia Plan:    Post-op Pain Management:    Induction:   Airway Management Planned: LMA  Additional Equipment:   Intra-op Plan:   Post-operative Plan: Extubation in OR  Informed Consent:   Plan Discussed with: Surgeon  Anesthesia Plan Comments:       Anesthesia Quick Evaluation

## 2014-08-26 NOTE — Discharge Instructions (Signed)
1. You may see some blood in the urine and may have some burning with urination for 48-72 hours. You also may notice that you have to urinate more frequently or urgently after your procedure which is normal.  °2. You should call should you develop an inability urinate, fever > 101, persistent nausea and vomiting that prevents you from eating or drinking to stay hydrated.  °

## 2014-08-26 NOTE — H&P (Signed)
Chief Complaint Left ureteral stone   History of Present Illness Connie Jenkins is a 33 year old female who developed severe left-sided flank and abdominal pain 2 days ago. She presented to the emergency department and underwent further evaluation including a CT scan of the abdomen and pelvis without contrast which demonstrated what appears to be a 3 mm distal left ureteral calculus with left hydronephrosis. She has had associated nausea and vomiting but denies any fever. Laboratory tests 2 days ago demonstrated a serum creatinine of 0.82, white blood count 9.7, urinalysis which demonstrated 20/1/50 red blood cells, and a urine pregnancy was negative. Her pain has been fairly poorly controlled with oral pain medication. In addition, she has had persistent nausea and vomiting and has been unable to keep anything down over the past 12 hours.   Past Medical History Problems  1. History of Anxiety (300.00) 2. History of asthma (V12.69) 3. History of herpes simplex infection (V12.09)  Surgical History Problems  1. History of Cesarean Section 2. History of Hernia Repair 3. History of Hysterectomy 4. History of Oral Surgery Tooth Extraction 5. History of TMJ Arthroscopy (Therapeutic)  Current Meds 1. Oxycodone-Acetaminophen 5-325 MG Oral Tablet;  Therapy: (Recorded:30Sep2015) to Recorded 2. Tamsulosin HCl - 0.4 MG Oral Capsule;  Therapy: (Recorded:30Sep2015) to Recorded  Allergies Medication  1. Flagyl TABS 2. Penicillins Non-Medication  3. Latex  Family History Problems  1. Family history of diabetes mellitus (V18.0) : Mother 2. Family history of hypertension (V17.49) : Grandmother  Social History Problems    Denied: History of Alcohol use   Denied: History of Caffeine use   Never a smoker   Single   Two children  Review of Systems Genitourinary, constitutional, skin, eye, otolaryngeal, hematologic/lymphatic, cardiovascular, pulmonary, endocrine, musculoskeletal,  gastrointestinal, neurological and psychiatric system(s) were reviewed and pertinent findings if present are noted.  Genitourinary: no hematuria.  Gastrointestinal: nausea and vomiting.  Constitutional: night sweats.  Respiratory: shortness of breath.  Musculoskeletal: back pain.  Neurological: dizziness.  Psychiatric: depression and anxiety.    Vitals Vital Signs [Data Includes: Last 1 Day]  Recorded: 30Sep2015 11:37AM  Height: 5 ft 3 in Weight: 196 lb  BMI Calculated: 34.72 BSA Calculated: 1.92 Blood Pressure: 117 / 82 Temperature: 98.1 F Heart Rate: 68  Physical Exam Constitutional: Well nourished and well developed . No acute distress.  ENT:. The ears and nose are normal in appearance.  Neck: The appearance of the neck is normal and no neck mass is present.  Pulmonary: No respiratory distress, normal respiratory rhythm and effort and clear bilateral breath sounds.  Cardiovascular: Heart rate and rhythm are normal . No peripheral edema.  Abdomen: No masses are palpated. Moderate tenderness in the LUQ is present. moderate left CVA tenderness. No hernias are palpable. No hepatosplenomegaly noted.  Skin: Normal skin turgor, no visible rash and no visible skin lesions.  Neuro/Psych:. Mood and affect are appropriate.    Results/Data Urine [Data Includes: Last 1 Day]   30Sep2015  COLOR YELLOW   APPEARANCE CLEAR   SPECIFIC GRAVITY 1.025   pH 6.0   GLUCOSE NEG mg/dL  BILIRUBIN SMALL   KETONE NEG mg/dL  BLOOD LARGE   PROTEIN NEG mg/dL  UROBILINOGEN 0.2 mg/dL  NITRITE NEG   LEUKOCYTE ESTERASE NEG   SQUAMOUS EPITHELIAL/HPF MANY   WBC 3-6 WBC/hpf  RBC 11-20 RBC/hpf  BACTERIA MODERATE   CRYSTALS NONE SEEN   CASTS NONE SEEN    Urine appears contaminated but will be sent for culture.    I  haven't apparently reviewed her recent medical records including her laboratory studies and her CT images as stated above from 08/24/14.   Assessment Assessed  1. Ureteral stone with  hydronephrosis (592.1,591)  Plan Health Maintenance  1. UA With REFLEX; [Do Not Release]; Status:Complete;   Done: [id number] 12:04PM Ureteral stone with hydronephrosis  2. Administer: Ketorolac Tromethamine 30 MG/ML Injection Solution; inject 1 ml; To Be  Done: 30Sep2015 3. URINE CULTURE; Status:In Progress - Specimen/Data Collected;   Done: 30Sep2015  Discussion/Summary Distal left ureteral calculus: Considering that her pain is poorly controlled and considering her persistent nausea and vomiting, she does wish to proceed with definitive therapy today. We therefore discussed options and I recommended receiving with left ureteroscopic laser lithotripsy and possible ureteral stent placement. We did review potential risks and complications as well as the expected recovery process and alternative treatment options. She wishes to proceed as planned.       Signatures Electronically signed by : Raynelle Bring, M.D.; Aug 26 2014 12:35PM EST

## 2014-08-26 NOTE — Transfer of Care (Signed)
Immediate Anesthesia Transfer of Care Note  Patient: Connie Jenkins  Procedure(s) Performed: Procedure(s): CYSTOSCOPY WITH RETROGRADE PYELOGRAM, URETEROSCOPY, LASER, STONE EXTRACTION WITH BASKET (Left)  Patient Location: PACU  Anesthesia Type:General  Level of Consciousness: awake, sedated and responds to stimulation  Airway & Oxygen Therapy: Patient Spontanous Breathing and Patient connected to face mask oxygen  Post-op Assessment: Report given to PACU RN and Post -op Vital signs reviewed and stable  Post vital signs: Reviewed and stable  Complications: No apparent anesthesia complications

## 2014-08-27 ENCOUNTER — Encounter (HOSPITAL_COMMUNITY): Payer: Self-pay | Admitting: Urology

## 2014-09-28 ENCOUNTER — Encounter (HOSPITAL_COMMUNITY): Payer: Self-pay | Admitting: Urology

## 2014-12-05 ENCOUNTER — Other Ambulatory Visit (HOSPITAL_COMMUNITY)
Admission: RE | Admit: 2014-12-05 | Discharge: 2014-12-05 | Disposition: A | Discharge status: Home / Self Care | Payer: Medicaid Other | Source: Ambulatory Visit | Attending: Emergency Medicine | Admitting: Emergency Medicine

## 2014-12-05 ENCOUNTER — Emergency Department (HOSPITAL_COMMUNITY)
Admission: EM | Admit: 2014-12-05 | Discharge: 2014-12-05 | Disposition: A | Discharge status: Home / Self Care | Payer: Medicaid Other | Source: Home / Self Care | Attending: Emergency Medicine | Admitting: Emergency Medicine

## 2014-12-05 ENCOUNTER — Encounter (HOSPITAL_COMMUNITY): Payer: Self-pay | Admitting: Emergency Medicine

## 2014-12-05 DIAGNOSIS — N730 Acute parametritis and pelvic cellulitis: Secondary | ICD-10-CM

## 2014-12-05 DIAGNOSIS — N760 Acute vaginitis: Secondary | ICD-10-CM | POA: Insufficient documentation

## 2014-12-05 DIAGNOSIS — Z113 Encounter for screening for infections with a predominantly sexual mode of transmission: Secondary | ICD-10-CM | POA: Diagnosis not present

## 2014-12-05 LAB — POCT URINALYSIS DIP (DEVICE)
Bilirubin Urine: NEGATIVE
Glucose, UA: NEGATIVE mg/dL
Hgb urine dipstick: NEGATIVE
KETONES UR: NEGATIVE mg/dL
Leukocytes, UA: NEGATIVE
NITRITE: NEGATIVE
PROTEIN: NEGATIVE mg/dL
Specific Gravity, Urine: >=1.03 (ref 1.005–1.030)
Urobilinogen, UA: 0.2 mg/dL (ref 0.0–1.0)
pH: 5.5 (ref 5.0–8.0)

## 2014-12-05 LAB — POCT PREGNANCY, URINE: PREG TEST UR: NEGATIVE

## 2014-12-05 MED ORDER — HYDROCODONE-ACETAMINOPHEN 5-325 MG PO TABS: ORAL_TABLET | ORAL | Status: DC

## 2014-12-05 MED ORDER — LEVOFLOXACIN 500 MG PO TABS: 500.0000 mg | ORAL_TABLET | Freq: Every day | ORAL | Status: DC

## 2014-12-05 MED ORDER — CLINDAMYCIN HCL 300 MG PO CAPS: 300.0000 mg | ORAL_CAPSULE | Freq: Four times a day (QID) | ORAL | Status: DC

## 2014-12-05 NOTE — ED Provider Notes (Signed)
Chief Complaint   Urinary Tract Infection and Vaginal Discharge   History of Present Illness   Connie Jenkins is a 34 year old female who has had a four-day history of sharp, bilateral pelvic pain, slightly worse with intercourse, rated 6-7/10 in intensity. No radiation to the back area and she's had some vaginal discharge and slight odor. She denies any itching. She's also had some dysuria and frequency and felt nauseated. She denies any fevers, chills, vomiting, diarrhea, or blood in the urine. She has had no prior history of STDs, female problems, or pelvic infections. Her last menstrual period was one month ago. She is sexually active.  Review of Systems   Other than as noted above, the patient denies any of the following symptoms: Systemic:  No fever or chills GI:  No abdominal pain, nausea, vomiting, diarrhea, constipation, melena or hematochezia. GU:  No dysuria, frequency, urgency, hematuria, vaginal discharge, itching, or abnormal vaginal bleeding.  [place]   Past medical history, family history, social history, meds, and allergies were reviewed.  She is allergic to penicillin which has caused her to break out in hives and to Flagyl.  Physical Examination    Vital signs:  BP 110/84 mmHg  Pulse 76  Temp(Src) 98.2 F (36.8 C) (Oral)  Resp 18  SpO2 99% General:  Alert, oriented and in no distress. Lungs:  Breath sounds clear and equal bilaterally.  No wheezes, rales or rhonchi. Heart:  Regular rhythm.  No gallops or murmers. Abdomen:  Soft, flat and non-distended.  No organomegaly or mass.  No tenderness, guarding or rebound.  Bowel sounds normally active. Pelvic exam:  Normal external genitalia. Vaginal and cervical mucosa were normal. There was no discharge, no odor, and no bleeding. She has moderate cervical motion pain. Uterus is normal in size and shape and moderately tender. She has moderate bilateral adnexal pain with no masses.  DNA probes for gonorrhea, Chlamydia,  Trichomonas, Gardnerella, Candida were obtained. Skin:  Clear, warm and dry.  Labs   Results for orders placed or performed during the hospital encounter of 12/05/14  POCT urinalysis dip (device)  Result Value Ref Range   Glucose, UA NEGATIVE NEGATIVE mg/dL   Bilirubin Urine NEGATIVE NEGATIVE   Ketones, ur NEGATIVE NEGATIVE mg/dL   Specific Gravity, Urine >=1.030 1.005 - 1.030   Hgb urine dipstick NEGATIVE NEGATIVE   pH 5.5 5.0 - 8.0   Protein, ur NEGATIVE NEGATIVE mg/dL   Urobilinogen, UA 0.2 0.0 - 1.0 mg/dL   Nitrite NEGATIVE NEGATIVE   Leukocytes, UA NEGATIVE NEGATIVE  Pregnancy, urine POC  Result Value Ref Range   Preg Test, Ur NEGATIVE NEGATIVE    Urine was cultured and serologies for HIV and syphilis were obtained.  Assessment   The encounter diagnosis was PID (acute pelvic inflammatory disease).  Since she has an urticarial reaction to penicillin and is allergic to Flagyl, treatment is somewhat problematic. Reviewing suggestions in the [employer] guide, suggest that if gonorrhea is not a problem, can use Levaquin 500 mg daily for 14 days. It was suggested that Flagyl be had to this, but since she is allergic to this we'll add instead clindamycin 300 mg 4 times a day for 14 days. If positive for gonorrhea, will need to rethink this regimen, and possibly consider azithromycin 2000 mg as a single dose.       Plan    1.  Meds:  The following meds were prescribed:   Discharge Medication List as of 12/05/2014 10:52 AM  START taking these medications   Details  clindamycin (CLEOCIN) 300 MG capsule Take 1 capsule (300 mg total) by mouth 4 (four) times daily., Starting 12/05/2014, Until Discontinued, Normal    HYDROcodone-acetaminophen (NORCO/VICODIN) 5-325 MG per tablet 1 to 2 tabs every 4 to 6 hours as needed for pain., Print    levofloxacin (LEVAQUIN) 500 MG tablet Take 1 tablet (500 mg total) by mouth daily., Starting 12/05/2014, Until Discontinued, Normal        2.  Patient  Education/Counseling:  The patient was given appropriate handouts, self care instructions, and instructed in symptomatic relief.    3.  Follow up:  The patient was told to follow up here if no better in 3 to 4 days, or sooner if becoming worse in any way, and given some red flag symptoms such as worsening pain, fever, persistent vomiting, or heavy vaginal bleeding which would prompt immediate return.       Harden Mo, MD 12/05/14 1134

## 2014-12-05 NOTE — Discharge Instructions (Signed)
Pelvic Inflammatory Disease Pelvic inflammatory disease (PID) refers to an infection in some or all of the female organs. The infection can be in the uterus, ovaries, fallopian tubes, or the surrounding tissues in the pelvis. PID can cause abdominal or pelvic pain that comes on suddenly (acute pelvic pain). PID is a serious infection because it can lead to lasting (chronic) pelvic pain or the inability to have children (infertile).  CAUSES  The infection is often caused by the normal bacteria found in the vaginal tissues. PID may also be caused by an infection that is spread during sexual contact. PID can also occur following:   The birth of a baby.   A miscarriage.   An abortion.   Major pelvic surgery.   The use of an intrauterine device (IUD).   A sexual assault.  RISK FACTORS Certain factors can put a person at higher risk for PID, such as:  Being younger than 25 years.  Being sexually active at [place] age.  Usingnonbarrier contraception.  Havingmultiple sexual partners.  Having sex with someone who has symptoms of a genital infection.  Using oral contraception. Other times, certain behaviors can increase the possibility of getting PID, such as:  Having sex during your period.  Using a vaginal douche.  Having an intrauterine device (IUD) in place. SYMPTOMS   Abdominal or pelvic pain.   Fever.   Chills.   Abnormal vaginal discharge.  Abnormal uterine bleeding.   Unusual pain shortly after finishing your period. DIAGNOSIS  Your caregiver will choose some of the following methods to make a diagnosis, such as:   Performinga physical exam and history. A pelvic exam typically reveals a very tender uterus and surrounding pelvis.   Ordering laboratory tests including a pregnancy test, blood tests, and urine test.  Orderingcultures of the vagina and cervix to check for a sexually transmitted infection (STI).  Performing an ultrasound.    Performing a laparoscopic procedure to look inside the pelvis.  TREATMENT   Antibiotic medicines may be prescribed and taken by mouth.   Sexual partners may be treated when the infection is caused by a sexually transmitted disease (STD).   Hospitalization may be needed to give antibiotics intravenously.  Surgery may be needed, but this is rare. It may take weeks until you are completely well. If you are diagnosed with PID, you should also be checked for human immunodeficiency virus (HIV). HOME CARE INSTRUCTIONS   If given, take your antibiotics as directed. Finish the medicine even if you start to feel better.   Only take over-the-counter or prescription medicines for pain, discomfort, or fever as directed by your caregiver.   Do not have sexual intercourse until treatment is completed or as directed by your caregiver. If PID is confirmed, your recent sexual partner(s) will need treatment.   Keep your follow-up appointments. SEEK MEDICAL CARE IF:   You have increased or abnormal vaginal discharge.   You need prescription medicine for your pain.   You vomit.   You cannot take your medicines.   Your partner has an STD.  SEEK IMMEDIATE MEDICAL CARE IF:   You have a fever.   You have increased abdominal or pelvic pain.   You have chills.   You have pain when you urinate.   You are not better after 72 hours following treatment.  MAKE SURE YOU:   Understand these instructions.  Will watch your condition.  Will get help right away if you are not doing well or get worse.  Document Released: 11/13/2005 Document Revised: 03/10/2013 Document Reviewed: 11/09/2011 [facility] Patient Information 2015 [place], [place]. This information is not intended to replace advice given to you by your health care provider. Make sure you discuss any questions you have with your health care provider.

## 2014-12-05 NOTE — ED Notes (Signed)
Pt states that she has had abdominal and vaginal pain with discharge and odor.

## 2014-12-06 LAB — RPR: RPR Ser Ql: NONREACTIVE

## 2014-12-06 LAB — HIV ANTIBODY (ROUTINE TESTING W REFLEX)
HIV 1/O/2 Abs-Index Value: <1 (ref <1.00)
HIV-1/HIV-2 Ab: NONREACTIVE

## 2014-12-07 LAB — CERVICOVAGINAL ANCILLARY ONLY
Chlamydia: NEGATIVE
NEISSERIA GONORRHEA: NEGATIVE

## 2014-12-08 ENCOUNTER — Telehealth (HOSPITAL_COMMUNITY): Payer: Self-pay | Admitting: *Deleted

## 2014-12-08 LAB — URINE CULTURE: SPECIAL REQUESTS: NORMAL

## 2014-12-08 NOTE — Progress Notes (Signed)
Quick Note:  Result is abnormal as noted. We will inform patient about abnormal result.   Her urine culture grew out MRSA which was intermediate sensitivity to levofloxacin. She was treated with levofloxacin and clindamycin. I think this will be sufficient. We will call her for clinical improvement, and have her return again for a repeat culture after antibiotics. ______

## 2014-12-08 NOTE — ED Notes (Addendum)
HIV/RPR non-reactive.  Urine culture:  15,000 colonies MRSA.  Treated with Levaquin-intermediate and Clindamycin that is not on the sensitivity.  Shown to Dr. Jake Michaelis. He wants pt. to finish all of medication and come back here or with PCP  in 2 weeks for a repeat urine culture.  I called pt. Pt. verified x 2 and given results. Pt. given instructions.  She has Dr. Ruthann Cancer as her GYN.  I told her she can see him or come back here for her recheck.  Pt.'s questions answered and she voiced understanding. Roselyn Meier 12/08/2014 HIV/RPR non-reactive, GC/Chlamydia neg. 12/08/2014 I called Peter Congo in Cytology and she said the Affirm is all neg. She will look into getting it posted on her chart. 12/09/2014

## 2014-12-10 LAB — CERVICOVAGINAL ANCILLARY ONLY
Wet Prep (BD Affirm): NEGATIVE
Wet Prep (BD Affirm): NEGATIVE
Wet Prep (BD Affirm): NEGATIVE

## 2015-01-19 ENCOUNTER — Inpatient Hospital Stay (HOSPITAL_COMMUNITY)
Admission: AD | Admit: 2015-01-19 | Discharge: 2015-01-20 | Disposition: A | Discharge status: Home / Self Care | Payer: Medicaid Other | Source: Ambulatory Visit | Attending: Obstetrics | Admitting: Obstetrics

## 2015-01-19 ENCOUNTER — Encounter (HOSPITAL_COMMUNITY): Payer: Self-pay

## 2015-01-19 ENCOUNTER — Inpatient Hospital Stay (HOSPITAL_COMMUNITY): Payer: Medicaid Other

## 2015-01-19 DIAGNOSIS — R1032 Left lower quadrant pain: Secondary | ICD-10-CM | POA: Diagnosis not present

## 2015-01-19 LAB — URINALYSIS, ROUTINE W REFLEX MICROSCOPIC
BILIRUBIN URINE: NEGATIVE
GLUCOSE, UA: NEGATIVE mg/dL
Ketones, ur: NEGATIVE mg/dL
Leukocytes, UA: NEGATIVE
Nitrite: NEGATIVE
PH: 6 (ref 5.0–8.0)
PROTEIN: NEGATIVE mg/dL
Specific Gravity, Urine: >1.03 — ABNORMAL HIGH (ref 1.005–1.030)
Urobilinogen, UA: 0.2 mg/dL (ref 0.0–1.0)

## 2015-01-19 LAB — WET PREP, GENITAL
CLUE CELLS WET PREP: NONE SEEN
TRICH WET PREP: NONE SEEN
WBC, Wet Prep HPF POC: NONE SEEN
Yeast Wet Prep HPF POC: NONE SEEN

## 2015-01-19 LAB — CBC WITH DIFFERENTIAL/PLATELET
BASOS ABS: 0 10*3/uL (ref 0.0–0.1)
Basophils Relative: 0 % (ref 0–1)
EOS PCT: 2 % (ref 0–5)
Eosinophils Absolute: 0.1 10*3/uL (ref 0.0–0.7)
HEMATOCRIT: 40.2 % (ref 36.0–46.0)
HEMOGLOBIN: 13.1 g/dL (ref 12.0–15.0)
LYMPHS ABS: 2.9 10*3/uL (ref 0.7–4.0)
LYMPHS PCT: 43 % (ref 12–46)
MCH: 27.8 pg (ref 26.0–34.0)
MCHC: 32.6 g/dL (ref 30.0–36.0)
MCV: 85.4 fL (ref 78.0–100.0)
MONOS PCT: 7 % (ref 3–12)
Monocytes Absolute: 0.5 10*3/uL (ref 0.1–1.0)
NEUTROS ABS: 3.2 10*3/uL (ref 1.7–7.7)
Neutrophils Relative %: 48 % (ref 43–77)
Platelets: 262 10*3/uL (ref 150–400)
RBC: 4.71 MIL/uL (ref 3.87–5.11)
RDW: 13.6 % (ref 11.5–15.5)
WBC: 6.7 10*3/uL (ref 4.0–10.5)

## 2015-01-19 LAB — URINE MICROSCOPIC-ADD ON

## 2015-01-19 LAB — POCT PREGNANCY, URINE: Preg Test, Ur: NEGATIVE

## 2015-01-19 NOTE — MAU Provider Note (Signed)
History     CSN: [id number]  Arrival date and time: 01/19/15 2156   First Provider Initiated Contact with Patient 01/19/15 2317      Chief Complaint  Patient presents with  . Abdominal Pain   HPI Connie Jenkins is a 34 y.o. [phone] who presents to MAU today with complaint of LLQ pain x 1-2 weeks. The patient states LMP 11/18/14. She had started Depo Provera and had one dose and missed her next dose in December stating that she "does not want to be on birth control." She states mild dysuria once today and N/V yesterday that resolved. She also states fever yesterday that resolved without medication. She denies diarrhea, vaginal bleeding or discharge. She states last BM was this morning.  OB History    Gravida Para Term Preterm AB TAB SAB Ectopic Multiple Living   3 2 1 1 1  1   2       Past Medical History  Diagnosis Date  . Febrile illness, acute 01/2011    Hospitalized for 6 days  . Herpes 2008  . Seasonal allergies   . Asthma     inhaler rarely used - twice year  . Anxiety     no meds  . Hx of bronchitis 09/2012  . Renal stone 08/26/2014  . Depression     no current Tx.    Past Surgical History  Procedure Laterality Date  . Cesarean section  2000 & 2004    "1st one I dilated 1cm, then the 2nd one was a rpt  . Hernia repair    . Wisdom tooth extraction    . Tmj arthroplasty    . Hysteroscopy N/A 03/24/2013    Procedure: HYSTEROSCOPY / Dilitation and curettage/endometrial curettings;  Surgeon: Lavonia Drafts, MD;  Location: [facility];  Service: Gynecology;  Laterality: N/A;  Diagnostic hysteroscopy  . Cystoscopy with retrograde pyelogram, ureteroscopy and stent placement Left 08/26/2014    Procedure: CYSTOSCOPY WITH RETROGRADE PYELOGRAM, URETEROSCOPY, LASER, STONE EXTRACTION WITH BASKET;  Surgeon: Raynelle Bring, MD;  Location: [facility];  Service: Urology;  Laterality: Left;    History reviewed. No pertinent family history.  History  Substance Use Topics  .  Smoking status: Never Smoker   . Smokeless tobacco: Never Used  . Alcohol Use: No    Allergies:  Allergies  Allergen Reactions  . Flagyl [Metronidazole] Nausea And Vomiting  . Latex Itching  . Penicillins Hives and Other (See Comments)    Childhood allergy    Prescriptions prior to admission  Medication Sig Dispense Refill Last Dose  . clindamycin (CLEOCIN) 300 MG capsule Take 1 capsule (300 mg total) by mouth 4 (four) times daily. (Patient not taking: Reported on 01/19/2015) 60 capsule 0 Completed Course at Unknown time  . HYDROcodone-acetaminophen (NORCO/VICODIN) 5-325 MG per tablet 1 to 2 tabs every 4 to 6 hours as needed for pain. (Patient not taking: Reported on 01/19/2015) 20 tablet 0 Not Taking at Unknown time  . levofloxacin (LEVAQUIN) 500 MG tablet Take 1 tablet (500 mg total) by mouth daily. (Patient not taking: Reported on 01/19/2015) 14 tablet 0 Completed Course at Unknown time  . oxyCODONE-acetaminophen (ROXICET) 5-325 MG per tablet Take 1 tablet by mouth every 4 (four) hours as needed for severe pain. (Patient not taking: Reported on 01/19/2015) 30 tablet 0 Not Taking at Unknown time    Review of Systems  Constitutional: Negative for fever and malaise/fatigue.  Gastrointestinal: Positive for abdominal pain. Negative for nausea, vomiting, diarrhea and  constipation.  Genitourinary: Positive for dysuria. Negative for urgency and frequency.       Neg - vaginal bleeding, discharge   Physical Exam   Blood pressure 105/68, pulse 78, temperature 98.1 F (36.7 C), temperature source Oral, resp. rate 16, height 5\' 3"  (1.6 m), weight 192 lb (87.091 kg), last menstrual period 11/18/2014, SpO2 99 %.  Physical Exam  Constitutional: She is oriented to person, place, and time. She appears well-developed and well-nourished. No distress.  HENT:  Head: Normocephalic.  Cardiovascular: Normal rate.   Respiratory: Effort normal.  GI: Soft. She exhibits no distension and no mass. There is  tenderness (mild tenderness to palpation of the LLQ ). There is no rebound and no guarding.  Genitourinary: Uterus is not enlarged and not tender. Cervix exhibits no motion tenderness, no discharge and no friability. Right adnexum displays no mass and no tenderness. Left adnexum displays no mass and no tenderness. No bleeding in the vagina. No vaginal discharge found.  Neurological: She is alert and oriented to person, place, and time.  Skin: Skin is warm and dry. No erythema.  Psychiatric: She has a normal mood and affect.   Results for orders placed or performed during the hospital encounter of 01/19/15 (from the past 24 hour(s))  Urinalysis, Routine w reflex microscopic     Status: Abnormal   Collection Time: 01/19/15 10:32 PM  Result Value Ref Range   Color, Urine YELLOW YELLOW   APPearance CLEAR CLEAR   Specific Gravity, Urine >1.030 (H) 1.005 - 1.030   pH 6.0 5.0 - 8.0   Glucose, UA NEGATIVE NEGATIVE mg/dL   Hgb urine dipstick SMALL (A) NEGATIVE   Bilirubin Urine NEGATIVE NEGATIVE   Ketones, ur NEGATIVE NEGATIVE mg/dL   Protein, ur NEGATIVE NEGATIVE mg/dL   Urobilinogen, UA 0.2 0.0 - 1.0 mg/dL   Nitrite NEGATIVE NEGATIVE   Leukocytes, UA NEGATIVE NEGATIVE  Urine microscopic-add on     Status: Abnormal   Collection Time: 01/19/15 10:32 PM  Result Value Ref Range   Squamous Epithelial / LPF RARE RARE   WBC, UA 0-2 <3 WBC/hpf   RBC / HPF 3-6 <3 RBC/hpf   Bacteria, UA FEW (A) RARE   Crystals CA OXALATE CRYSTALS (A) NEGATIVE  Pregnancy, urine POC     Status: None   Collection Time: 01/19/15 10:47 PM  Result Value Ref Range   Preg Test, Ur NEGATIVE NEGATIVE  Wet prep, genital     Status: None   Collection Time: 01/19/15 11:32 PM  Result Value Ref Range   Yeast Wet Prep HPF POC NONE SEEN NONE SEEN   Trich, Wet Prep NONE SEEN NONE SEEN   Clue Cells Wet Prep HPF POC NONE SEEN NONE SEEN   WBC, Wet Prep HPF POC NONE SEEN NONE SEEN  CBC with Differential/Platelet     Status: None    Collection Time: 01/19/15 11:35 PM  Result Value Ref Range   WBC 6.7 4.0 - 10.5 K/uL   RBC 4.71 3.87 - 5.11 MIL/uL   Hemoglobin 13.1 12.0 - 15.0 g/dL   HCT 40.2 36.0 - 46.0 %   MCV 85.4 78.0 - 100.0 fL   MCH 27.8 26.0 - 34.0 pg   MCHC 32.6 30.0 - 36.0 g/dL   RDW 13.6 11.5 - 15.5 %   Platelets 262 150 - 400 K/uL   Neutrophils Relative % 48 43 - 77 %   Neutro Abs 3.2 1.7 - 7.7 K/uL   Lymphocytes Relative 43 12 - 46 %  Lymphs Abs 2.9 0.7 - 4.0 K/uL   Monocytes Relative 7 3 - 12 %   Monocytes Absolute 0.5 0.1 - 1.0 K/uL   Eosinophils Relative 2 0 - 5 %   Eosinophils Absolute 0.1 0.0 - 0.7 K/uL   Basophils Relative 0 0 - 1 %   Basophils Absolute 0.0 0.0 - 0.1 K/uL   US Transvaginal Non-ob  01/20/2015   CLINICAL DATA:  Intermittent left lower quadrant pain for 1 week.  EXAM: TRANSABDOMINAL AND TRANSVAGINAL ULTRASOUND OF PELVIS  TECHNIQUE: Both transabdominal and transvaginal ultrasound examinations of the pelvis were performed. Transabdominal technique was performed for global imaging of the pelvis including uterus, ovaries, adnexal regions, and pelvic cul-de-sac. It was necessary to proceed with endovaginal exam following the transabdominal exam to visualize the endometrium and ovaries.  COMPARISON:  Pelvic ultrasound 08/24/2014  FINDINGS: Uterus  Measurements: 7.4 x 3.3 x 4.2 cm. No fibroids or other mass visualized. Caesarean section scar of the anterior lower uterus is tentatively identified.  Endometrium  Thickness: 3.2 mm.  No focal abnormality visualized.  Right ovary  Measurements: 3.2 x 1.7 x 2.6 cm. Normal appearance/no adnexal mass. Physiologic follicles are seen. Normal blood flow.  Left ovary  Measurements: 2.3 x 1.4 x 3.0 cm. Normal appearance/no adnexal mass. Normal blood flow.  Other findings  No free fluid.  IMPRESSION: 1. Normal appearance of both ovaries. 2. Caesarean section scar noted, otherwise unremarkable appearance of the uterus.   Electronically Signed   By: Jeb Levering M.D.   On: 01/20/2015 00:43   US Pelvis Complete  01/20/2015   CLINICAL DATA:  Intermittent left lower quadrant pain for 1 week.  EXAM: TRANSABDOMINAL AND TRANSVAGINAL ULTRASOUND OF PELVIS  TECHNIQUE: Both transabdominal and transvaginal ultrasound examinations of the pelvis were performed. Transabdominal technique was performed for global imaging of the pelvis including uterus, ovaries, adnexal regions, and pelvic cul-de-sac. It was necessary to proceed with endovaginal exam following the transabdominal exam to visualize the endometrium and ovaries.  COMPARISON:  Pelvic ultrasound 08/24/2014  FINDINGS: Uterus  Measurements: 7.4 x 3.3 x 4.2 cm. No fibroids or other mass visualized. Caesarean section scar of the anterior lower uterus is tentatively identified.  Endometrium  Thickness: 3.2 mm.  No focal abnormality visualized.  Right ovary  Measurements: 3.2 x 1.7 x 2.6 cm. Normal appearance/no adnexal mass. Physiologic follicles are seen. Normal blood flow.  Left ovary  Measurements: 2.3 x 1.4 x 3.0 cm. Normal appearance/no adnexal mass. Normal blood flow.  Other findings  No free fluid.  IMPRESSION: 1. Normal appearance of both ovaries. 2. Caesarean section scar noted, otherwise unremarkable appearance of the uterus.   Electronically Signed   By: Jeb Levering M.D.   On: 01/20/2015 00:43    MAU Course  Procedures None  MDM UPT - negative UA, wet prep, GC/Chlmaydia, CBC, HIV, RPR and [place] today Declines pain medication at this time  Assessment and Plan  A: LLQ abdominal pain  P: Discharge home Rx for Ibuprofen given to patient to be taken PRN Patient advised to follow-up with Dr. Ruthann Cancer as scheduled Patient may return to MAU as needed or if her condition were to change or worsen   Connie Redden, PA-C  01/20/2015, 12:47 AM

## 2015-01-19 NOTE — MAU Note (Signed)
Pt reports she is having left lower abd pain off/on x one week. Yesterday had fever and some vomiting. Has not had vomiting or fever today. Denies dysuria. LMP 11/18/2014, negative preg test at home.

## 2015-01-20 DIAGNOSIS — R1032 Left lower quadrant pain: Secondary | ICD-10-CM

## 2015-01-20 LAB — GC/CHLAMYDIA PROBE AMP (~~LOC~~) NOT AT ARMC
CHLAMYDIA, DNA PROBE: NEGATIVE
Neisseria Gonorrhea: NEGATIVE

## 2015-01-20 LAB — RPR: RPR Ser Ql: NONREACTIVE

## 2015-01-20 MED ORDER — IBUPROFEN 600 MG PO TABS: 600.0000 mg | ORAL_TABLET | Freq: Four times a day (QID) | ORAL | Status: DC | PRN

## 2015-01-20 NOTE — Discharge Instructions (Signed)
Abdominal Pain, Women °Abdominal (stomach, pelvic, or belly) pain can be caused by many things. It is important to tell your doctor: °· The location of the pain. °· Does it come and go or is it present all the time? °· Are there things that start the pain (eating certain foods, exercise)? °· Are there other symptoms associated with the pain (fever, nausea, vomiting, diarrhea)? °All of this is helpful to know when trying to find the cause of the pain. °CAUSES  °· Stomach: virus or bacteria infection, or ulcer. °· Intestine: appendicitis (inflamed appendix), regional ileitis (Crohn's disease), ulcerative colitis (inflamed colon), irritable bowel syndrome, diverticulitis (inflamed diverticulum of the colon), or cancer of the stomach or intestine. °· Gallbladder disease or stones in the gallbladder. °· Kidney disease, kidney stones, or infection. °· Pancreas infection or cancer. °· Fibromyalgia (pain disorder). °· Diseases of the female organs: °¨ Uterus: fibroid (non-cancerous) tumors or infection. °¨ Fallopian tubes: infection or tubal pregnancy. °¨ Ovary: cysts or tumors. °¨ Pelvic adhesions (scar tissue). °¨ Endometriosis (uterus lining tissue growing in the pelvis and on the pelvic organs). °¨ Pelvic congestion syndrome (female organs filling up with blood just before the menstrual period). °¨ Pain with the menstrual period. °¨ Pain with ovulation (producing an egg). °¨ Pain with an IUD (intrauterine device, birth control) in the uterus. °¨ Cancer of the female organs. °· Functional pain (pain not caused by a disease, may improve without treatment). °· Psychological pain. °· Depression. °DIAGNOSIS  °Your doctor will decide the seriousness of your pain by doing an examination. °· Blood tests. °· X-rays. °· Ultrasound. °· CT scan (computed tomography, special type of X-ray). °· MRI (magnetic resonance imaging). °· Cultures, for infection. °· Barium enema (dye inserted in the large intestine, to better view it with  X-rays). °· Colonoscopy (looking in intestine with a lighted tube). °· Laparoscopy (minor surgery, looking in abdomen with a lighted tube). °· Major abdominal exploratory surgery (looking in abdomen with a large incision). °TREATMENT  °The treatment will depend on the cause of the pain.  °· Many cases can be observed and treated at home. °· Over-the-counter medicines recommended by your caregiver. °· Prescription medicine. °· Antibiotics, for infection. °· Birth control pills, for painful periods or for ovulation pain. °· Hormone treatment, for endometriosis. °· Nerve blocking injections. °· Physical therapy. °· Antidepressants. °· Counseling with a psychologist or psychiatrist. °· Minor or major surgery. °HOME CARE INSTRUCTIONS  °· Do not take laxatives, unless directed by your caregiver. °· Take over-the-counter pain medicine only if ordered by your caregiver. Do not take aspirin because it can cause an upset stomach or bleeding. °· Try a clear liquid diet (broth or water) as ordered by your caregiver. Slowly move to a bland diet, as tolerated, if the pain is related to the stomach or intestine. °· Have a thermometer and take your temperature several times a day, and record it. °· Bed rest and sleep, if it helps the pain. °· Avoid sexual intercourse, if it causes pain. °· Avoid stressful situations. °· Keep your follow-up appointments and tests, as your caregiver orders. °· If the pain does not go away with medicine or surgery, you may try: °¨ Acupuncture. °¨ Relaxation exercises (yoga, meditation). °¨ Group therapy. °¨ Counseling. °SEEK MEDICAL CARE IF:  °· You notice certain foods cause stomach pain. °· Your home care treatment is not helping your pain. °· You need stronger pain medicine. °· You want your IUD removed. °· You feel faint or   lightheaded. °· You develop nausea and vomiting. °· You develop a rash. °· You are having side effects or an allergy to your medicine. °SEEK IMMEDIATE MEDICAL CARE IF:  °· Your  pain does not go away or gets worse. °· You have a fever. °· Your pain is felt only in portions of the abdomen. The right side could possibly be appendicitis. The left lower portion of the abdomen could be colitis or diverticulitis. °· You are passing blood in your stools (bright red or black tarry stools, with or without vomiting). °· You have blood in your urine. °· You develop chills, with or without a fever. °· You pass out. °MAKE SURE YOU:  °· Understand these instructions. °· Will watch your condition. °· Will get help right away if you are not doing well or get worse. °Document Released: 09/10/2007 Document Revised: 03/30/2014 Document Reviewed: 09/30/2009 °[employer]® Patient Information ©2015 [employer]. This information is not intended to replace advice given to you by your health care provider. Make sure you discuss any questions you have with your health care provider. ° °

## 2015-01-21 LAB — HIV ANTIBODY (ROUTINE TESTING W REFLEX): HIV SCREEN 4TH GENERATION: NONREACTIVE

## 2015-03-07 ENCOUNTER — Other Ambulatory Visit (HOSPITAL_COMMUNITY)
Admission: RE | Admit: 2015-03-07 | Discharge: 2015-03-07 | Disposition: A | Discharge status: Home / Self Care | Payer: Medicaid Other | Source: Ambulatory Visit | Attending: Family Medicine | Admitting: Family Medicine

## 2015-03-07 ENCOUNTER — Emergency Department (HOSPITAL_COMMUNITY)
Admission: EM | Admit: 2015-03-07 | Discharge: 2015-03-07 | Disposition: A | Discharge status: Home / Self Care | Payer: Medicaid Other | Source: Home / Self Care | Attending: Family Medicine | Admitting: Family Medicine

## 2015-03-07 ENCOUNTER — Encounter (HOSPITAL_COMMUNITY): Payer: Self-pay | Admitting: *Deleted

## 2015-03-07 DIAGNOSIS — Z113 Encounter for screening for infections with a predominantly sexual mode of transmission: Secondary | ICD-10-CM | POA: Diagnosis not present

## 2015-03-07 DIAGNOSIS — N342 Other urethritis: Secondary | ICD-10-CM

## 2015-03-07 DIAGNOSIS — N760 Acute vaginitis: Secondary | ICD-10-CM

## 2015-03-07 LAB — POCT URINALYSIS DIP (DEVICE)
BILIRUBIN URINE: NEGATIVE
GLUCOSE, UA: NEGATIVE mg/dL
HGB URINE DIPSTICK: NEGATIVE
Ketones, ur: NEGATIVE mg/dL
Leukocytes, UA: NEGATIVE
Nitrite: NEGATIVE
PH: 6 (ref 5.0–8.0)
PROTEIN: 30 mg/dL — AB
Urobilinogen, UA: 0.2 mg/dL (ref 0.0–1.0)

## 2015-03-07 LAB — POCT PREGNANCY, URINE: Preg Test, Ur: NEGATIVE

## 2015-03-07 MED ORDER — CLINDAMYCIN HCL 300 MG PO CAPS: 300.0000 mg | ORAL_CAPSULE | Freq: Two times a day (BID) | ORAL | Status: DC

## 2015-03-07 MED ORDER — CLINDAMYCIN PHOSPHATE 2 % VA CREA: 1.0000 | TOPICAL_CREAM | Freq: Every day | VAGINAL | Status: DC

## 2015-03-07 MED ORDER — PHENAZOPYRIDINE HCL 100 MG PO TABS: 100.0000 mg | ORAL_TABLET | Freq: Three times a day (TID) | ORAL | Status: DC | PRN

## 2015-03-07 NOTE — Discharge Instructions (Signed)
Your symptoms are likely from [place]. Please start the clindamycin gel. This will need treatment for 7 days Please use the Pyridium for bladder pain. We will call you if your other lab work comes back positive.

## 2015-03-07 NOTE — ED Provider Notes (Signed)
CSN: [id number]     Arrival date & time 03/07/15  1311 History   First MD Initiated Contact with Patient 03/07/15 1425     Chief Complaint  Patient presents with  . Urinary Tract Infection  . Vaginal Discharge   (Consider location/radiation/quality/duration/timing/severity/associated sxs/prior Treatment) HPI  2 days ago developed dysuria. Vaginal odor and discharge started 1 day ago.  Has not tried anything for this. Ice frequency, abdominal pain, back pain, nausea, vomiting, diarrhea, constipation, chest pain, palpitations, rash. Patient sexually active without condoms. Uses daily birth control pill for contraception. Vaginal odor and discharge are constant and getting worse.  Past Medical History  Diagnosis Date  . Febrile illness, acute 01/2011    Hospitalized for 6 days  . Herpes 2008  . Seasonal allergies   . Asthma     inhaler rarely used - twice year  . Anxiety     no meds  . Hx of bronchitis 09/2012  . Renal stone 08/26/2014  . Depression     no current Tx.   Past Surgical History  Procedure Laterality Date  . Cesarean section  2000 & 2004    "1st one I dilated 1cm, then the 2nd one was a rpt  . Hernia repair    . Wisdom tooth extraction    . Tmj arthroplasty    . Hysteroscopy N/A 03/24/2013    Procedure: HYSTEROSCOPY / Dilitation and curettage/endometrial curettings;  Surgeon: Lavonia Drafts, MD;  Location: [facility];  Service: Gynecology;  Laterality: N/A;  Diagnostic hysteroscopy  . Cystoscopy with retrograde pyelogram, ureteroscopy and stent placement Left 08/26/2014    Procedure: CYSTOSCOPY WITH RETROGRADE PYELOGRAM, URETEROSCOPY, LASER, STONE EXTRACTION WITH BASKET;  Surgeon: Raynelle Bring, MD;  Location: [facility];  Service: Urology;  Laterality: Left;   Family History  Problem Relation Age of Onset  . Hypertension Mother   . Diabetes Mother    History  Substance Use Topics  . Smoking status: Never Smoker   . Smokeless tobacco: Never Used  . Alcohol Use:  0.5 oz/week    1 Standard drinks or equivalent per week     Comment: occsasional   OB History    Gravida Para Term Preterm AB TAB SAB Ectopic Multiple Living   3 2 1 1 1  1   2      Review of Systems Per HPI with all other pertinent systems negative.    Allergies  Flagyl; Latex; and Penicillins  Home Medications   Prior to Admission medications   Medication Sig Start Date End Date Taking? Authorizing Provider  norgestrel-ethinyl estradiol (LO/OVRAL,CRYSELLE) 0.3-30 MG-MCG tablet Take 1 tablet by mouth daily.   Yes Historical Provider, MD  clindamycin (CLEOCIN) 2 % vaginal cream Place 1 Applicatorful vaginally at bedtime. Apply daily for 7 days 03/07/15   Waldemar Dickens, MD  ibuprofen (ADVIL,MOTRIN) 600 MG tablet Take 1 tablet (600 mg total) by mouth every 6 (six) hours as needed. 01/20/15   Luvenia Redden, PA-C  phenazopyridine (PYRIDIUM) 100 MG tablet Take 1-2 tablets (100-200 mg total) by mouth 3 (three) times daily as needed for pain. 03/07/15   Waldemar Dickens, MD   BP 124/69 mmHg  Pulse 80  Temp(Src) 99.1 F (37.3 C) (Oral)  Resp 22  SpO2 100%  LMP 01/25/2015 Physical Exam Physical Exam  Constitutional: oriented to person, place, and time. appears well-developed and well-nourished. No distress.  HENT:  Head: Normocephalic and atraumatic.  Eyes: EOMI. PERRL.  Neck: Normal range of motion.  Cardiovascular:  RRR, no m/r/g, 2+ distal pulses,  Pulmonary/Chest: Effort normal and breath sounds normal. No respiratory distress.  Abdominal: Soft. Bowel sounds are normal. NonTTP, no distension.  Musculoskeletal: Normal range of motion. Non ttp, no effusion.  Neurological: alert and oriented to person, place, and time.  Skin: Skin is warm. No rash noted. non diaphoretic.  Psychiatric: normal mood and affect. behavior is normal. Judgment and thought content normal.  GU: Vaginal walls well rugated without lesions. No cervical motion tenderness. Pools of thick green discharge  throughout the vaginal canal.  ED Course  Procedures (including critical care time) Labs Review Labs Reviewed  POCT URINALYSIS DIP (DEVICE) - Abnormal; Notable for the following:    Protein, ur 30 (*)    All other components within normal limits  POCT PREGNANCY, URINE  CERVICOVAGINAL ANCILLARY ONLY    Imaging Review No results found.   MDM   1. Vaginitis   2. Urethritis    peridium for urethritis. UA without evidence of UTI. Urine culture sent. Start Clindagel for suspected BV. Patient with a [employer] medical allergy. Cautions given and all questions answered. Full STD panel sent. Will follow-up as needed.    Waldemar Dickens, MD 03/07/15 [phone]

## 2015-03-07 NOTE — ED Notes (Signed)
C/o burning with urination onset Friday with urgency and frequency.  C/o vaginal discharge with an odor onset last night.

## 2015-03-08 LAB — CERVICOVAGINAL ANCILLARY ONLY
CHLAMYDIA, DNA PROBE: NEGATIVE
NEISSERIA GONORRHEA: NEGATIVE
Wet Prep (BD Affirm): NEGATIVE

## 2015-12-27 ENCOUNTER — Inpatient Hospital Stay (HOSPITAL_COMMUNITY)
Admission: AD | Admit: 2015-12-27 | Discharge: 2015-12-27 | Disposition: A | Discharge status: Home / Self Care | Payer: Medicaid Other | Source: Ambulatory Visit | Attending: Obstetrics & Gynecology | Admitting: Obstetrics & Gynecology

## 2015-12-27 ENCOUNTER — Encounter (HOSPITAL_COMMUNITY): Payer: Self-pay

## 2015-12-27 DIAGNOSIS — B373 Candidiasis of vulva and vagina: Secondary | ICD-10-CM | POA: Diagnosis not present

## 2015-12-27 DIAGNOSIS — B379 Candidiasis, unspecified: Secondary | ICD-10-CM

## 2015-12-27 DIAGNOSIS — Z3202 Encounter for pregnancy test, result negative: Secondary | ICD-10-CM | POA: Insufficient documentation

## 2015-12-27 DIAGNOSIS — R1030 Lower abdominal pain, unspecified: Secondary | ICD-10-CM | POA: Diagnosis present

## 2015-12-27 DIAGNOSIS — Z880 Allergy status to penicillin: Secondary | ICD-10-CM | POA: Diagnosis not present

## 2015-12-27 LAB — URINALYSIS, ROUTINE W REFLEX MICROSCOPIC
Bilirubin Urine: NEGATIVE
GLUCOSE, UA: NEGATIVE mg/dL
KETONES UR: NEGATIVE mg/dL
Leukocytes, UA: NEGATIVE
Nitrite: NEGATIVE
PH: 6 (ref 5.0–8.0)
PROTEIN: NEGATIVE mg/dL
Specific Gravity, Urine: >1.03 — ABNORMAL HIGH (ref 1.005–1.030)

## 2015-12-27 LAB — URINE MICROSCOPIC-ADD ON

## 2015-12-27 LAB — WET PREP, GENITAL
CLUE CELLS WET PREP: NONE SEEN
SPERM: NONE SEEN
Trich, Wet Prep: NONE SEEN

## 2015-12-27 LAB — POCT PREGNANCY, URINE: Preg Test, Ur: NEGATIVE

## 2015-12-27 LAB — GC/CHLAMYDIA PROBE AMP (~~LOC~~) NOT AT ARMC
CHLAMYDIA, DNA PROBE: NEGATIVE
NEISSERIA GONORRHEA: NEGATIVE

## 2015-12-27 MED ORDER — TERCONAZOLE 0.4 % VA CREA: 1.0000 | TOPICAL_CREAM | Freq: Every day | VAGINAL | Status: DC

## 2015-12-27 MED ORDER — CLINDAMYCIN HCL 300 MG PO CAPS: 300.0000 mg | ORAL_CAPSULE | Freq: Two times a day (BID) | ORAL | Status: DC

## 2015-12-27 NOTE — MAU Note (Signed)
Pt c/o mid to lower abdominal pain that started 2 weeks ago. States that it wakes her out of her sleep. Also states that she has a watery discharge with slight fishy/ammonia odor, as well as some itching. Has not had period since November. Was on OCPs but recently stopped taking them in November. Has had unprotected sex since then.

## 2015-12-27 NOTE — MAU Provider Note (Signed)
History   [id number]   Chief Complaint  Patient presents with  . Abdominal Pain    HPI Connie Jenkins is a 35 y.o. female  [phone] here with lower abdominal pain that started 2 weeks ago. Pain occasionally wakes her out of her sleep. Also reports a watery discharge with slight fishy/ammonia odor, as well as some itching.  LMP in November. Pt reports using OCPs but recently stopped taking them in November. +unprotected intercourse.    No LMP recorded. Patient is not currently having periods (Reason: Needs Pregnancy Test).  OB History  Gravida Para Term Preterm AB SAB TAB Ectopic Multiple Living  3 2 1 1 1 1    2     # Outcome Date GA Lbr Len/2nd Weight Sex Delivery Anes PTL Lv  3 SAB 2013          2 Term 09/18/03    F CS-LTranv Spinal  Y  1 Preterm 08/03/99    F CS-LTranv EPI  Y      Past Medical History  Diagnosis Date  . Febrile illness, acute 01/2011    Hospitalized for 6 days  . Herpes 2008  . Seasonal allergies   . Asthma     inhaler rarely used - twice year  . Anxiety     no meds  . Hx of bronchitis 09/2012  . Renal stone 08/26/2014  . Depression     no current Tx.    Family History  Problem Relation Age of Onset  . Hypertension Mother   . Diabetes Mother     Social History   Social History  . Marital Status: Single    Spouse Name: N/A  . Number of Children: N/A  . Years of Education: N/A   Social History Main Topics  . Smoking status: Never Smoker   . Smokeless tobacco: Never Used  . Alcohol Use: 0.5 oz/week    1 Standard drinks or equivalent per week     Comment: occsasional  . Drug Use: No  . Sexual Activity:    Partners: Male    Birth Control/ Protection: None     Comment: last intercourse was 2 weeks   Other Topics Concern  . None   Social History Narrative    Allergies  Allergen Reactions  . Flagyl [Metronidazole] Nausea And Vomiting  . Latex Itching  . Penicillins Hives and Other (See Comments)    Childhood allergy    No  current facility-administered medications on file prior to encounter.   Current Outpatient Prescriptions on File Prior to Encounter  Medication Sig Dispense Refill  . clindamycin (CLEOCIN) 300 MG capsule Take 1 capsule (300 mg total) by mouth 2 (two) times daily. 14 capsule 0  . ibuprofen (ADVIL,MOTRIN) 600 MG tablet Take 1 tablet (600 mg total) by mouth every 6 (six) hours as needed. 30 tablet 0  . norgestrel-ethinyl estradiol (LO/OVRAL,CRYSELLE) 0.3-30 MG-MCG tablet Take 1 tablet by mouth daily.    . phenazopyridine (PYRIDIUM) 100 MG tablet Take 1-2 tablets (100-200 mg total) by mouth 3 (three) times daily as needed for pain. 30 tablet 0  . [DISCONTINUED] DiphenhydrAMINE HCl (BENADRYL ALLERGY PO) Take 2 capsules by mouth daily as needed. Patient took this medication because of itching that occurred from eating fish.       Review of Systems  Constitutional: Negative for fever and chills.  Genitourinary: Positive for vaginal discharge, menstrual problem and pelvic pain. Negative for dysuria, frequency, vaginal bleeding and dyspareunia.  All other systems reviewed  and are negative.    Physical Exam   Filed Vitals:   12/27/15 0403  BP: 127/73  Pulse: 83  Temp: 98.2 F (36.8 C)  TempSrc: Oral  Resp: 20  Height: 5' 2.5" (1.588 m)  Weight: 97.886 kg (215 lb 12.8 oz)  SpO2: 100%    Physical Exam  Constitutional: She is oriented to person, place, and time. She appears well-developed and well-nourished.  HENT:  Head: Normocephalic.  Neck: Normal range of motion. Neck supple.  Cardiovascular: Normal rate and regular rhythm.   Respiratory: Effort normal and breath sounds normal.  GI: Soft. She exhibits no mass. There is no tenderness. There is no guarding.  Genitourinary: Uterus is not tender. Cervix exhibits no motion tenderness. Right adnexum displays no mass and no tenderness. Left adnexum displays no mass and no tenderness. Vaginal discharge (white, creamy; frothy) found.  +fishy  odor  Neurological: She is alert and oriented to person, place, and time.  Skin: Skin is warm and dry.    MAU Course  Procedures Results for orders placed or performed during the hospital encounter of 12/27/15 (from the past 24 hour(s))  Urinalysis, Routine w reflex microscopic (not at [facility])     Status: Abnormal   Collection Time: 12/27/15  4:06 AM  Result Value Ref Range   Color, Urine YELLOW YELLOW   APPearance CLEAR CLEAR   Specific Gravity, Urine >1.030 (H) 1.005 - 1.030   pH 6.0 5.0 - 8.0   Glucose, UA NEGATIVE NEGATIVE mg/dL   Hgb urine dipstick SMALL (A) NEGATIVE   Bilirubin Urine NEGATIVE NEGATIVE   Ketones, ur NEGATIVE NEGATIVE mg/dL   Protein, ur NEGATIVE NEGATIVE mg/dL   Nitrite NEGATIVE NEGATIVE   Leukocytes, UA NEGATIVE NEGATIVE  Urine microscopic-add on     Status: Abnormal   Collection Time: 12/27/15  4:06 AM  Result Value Ref Range   Squamous Epithelial / LPF 0-5 (A) NONE SEEN   WBC, UA 0-5 0 - 5 WBC/hpf   RBC / HPF 0-5 0 - 5 RBC/hpf   Bacteria, UA RARE (A) NONE SEEN  Pregnancy, urine POC     Status: None   Collection Time: 12/27/15  4:14 AM  Result Value Ref Range   Preg Test, Ur NEGATIVE NEGATIVE  Wet prep, genital     Status: Abnormal   Collection Time: 12/27/15  4:40 AM  Result Value Ref Range   Yeast Wet Prep HPF POC PRESENT (A) NONE SEEN   Trich, Wet Prep NONE SEEN NONE SEEN   Clue Cells Wet Prep HPF POC NONE SEEN NONE SEEN   WBC, Wet Prep HPF POC MODERATE (A) NONE SEEN   Sperm NONE SEEN      Assessment and Plan  Yeast Infection    Plan: Discharge home RX Flagyl 500 mg  BID (based on exam) RX Terazol Follow-up if no improvement or worsening of symptoms  Gwen Pounds, CNM 12/27/2015 5:23 AM

## 2016-02-02 ENCOUNTER — Encounter (HOSPITAL_COMMUNITY): Payer: Self-pay | Admitting: Emergency Medicine

## 2016-02-02 ENCOUNTER — Emergency Department (HOSPITAL_COMMUNITY)
Admission: EM | Admit: 2016-02-02 | Discharge: 2016-02-02 | Disposition: A | Discharge status: Home / Self Care | Payer: Medicaid Other | Source: Home / Self Care | Attending: Emergency Medicine | Admitting: Emergency Medicine

## 2016-02-02 ENCOUNTER — Other Ambulatory Visit (HOSPITAL_COMMUNITY)
Admission: RE | Admit: 2016-02-02 | Discharge: 2016-02-02 | Disposition: A | Discharge status: Home / Self Care | Payer: Medicaid Other | Source: Ambulatory Visit | Attending: Emergency Medicine | Admitting: Emergency Medicine

## 2016-02-02 DIAGNOSIS — R102 Pelvic and perineal pain: Secondary | ICD-10-CM

## 2016-02-02 DIAGNOSIS — Z113 Encounter for screening for infections with a predominantly sexual mode of transmission: Secondary | ICD-10-CM | POA: Diagnosis present

## 2016-02-02 DIAGNOSIS — N760 Acute vaginitis: Secondary | ICD-10-CM | POA: Insufficient documentation

## 2016-02-02 DIAGNOSIS — N898 Other specified noninflammatory disorders of vagina: Secondary | ICD-10-CM | POA: Diagnosis not present

## 2016-02-02 LAB — POCT URINALYSIS DIP (DEVICE)
BILIRUBIN URINE: NEGATIVE
Glucose, UA: NEGATIVE mg/dL
Ketones, ur: NEGATIVE mg/dL
LEUKOCYTES UA: NEGATIVE
NITRITE: NEGATIVE
Protein, ur: NEGATIVE mg/dL
Specific Gravity, Urine: >=1.03 (ref 1.005–1.030)
Urobilinogen, UA: 0.2 mg/dL (ref 0.0–1.0)
pH: 6.5 (ref 5.0–8.0)

## 2016-02-02 LAB — POCT PREGNANCY, URINE: PREG TEST UR: NEGATIVE

## 2016-02-02 MED ORDER — LIDOCAINE HCL (PF) 1 % IJ SOLN
INTRAMUSCULAR | Status: AC
  Filled 2016-02-02: qty 5

## 2016-02-02 MED ORDER — AZITHROMYCIN 250 MG PO TABS
ORAL_TABLET | ORAL | Status: AC
  Filled 2016-02-02: qty 4

## 2016-02-02 MED ORDER — CEFTRIAXONE SODIUM 250 MG IJ SOLR
250.0000 mg | Freq: Once | INTRAMUSCULAR | Status: AC
  Administered 2016-02-02: 250 mg via INTRAMUSCULAR

## 2016-02-02 MED ORDER — DOXYCYCLINE HYCLATE 100 MG PO CAPS: 100.0000 mg | ORAL_CAPSULE | Freq: Two times a day (BID) | ORAL | Status: DC

## 2016-02-02 MED ORDER — AZITHROMYCIN 250 MG PO TABS
1000.0000 mg | ORAL_TABLET | Freq: Once | ORAL | Status: AC
  Administered 2016-02-02: 1000 mg via ORAL

## 2016-02-02 MED ORDER — CEFTRIAXONE SODIUM 250 MG IJ SOLR
INTRAMUSCULAR | Status: AC
  Filled 2016-02-02: qty 250

## 2016-02-02 NOTE — Discharge Instructions (Signed)
I am concerned about possible infection given your tenderness. We gave you a shot of an antibiotic today. Please take doxycycline twice a day for the next 10 days. Your results will take 2-3 days to come back. Follow-up as needed.

## 2016-02-02 NOTE — ED Notes (Addendum)
Pt has been having mid lower back and abdominal pain that started today.  She reports frequent urination, itching, some mild odor and discharge.  She is on birth control but realizes she has not had a period since mid January.  She admits to missing a few doses in February, but doubled up on her medication.

## 2016-02-02 NOTE — ED Provider Notes (Signed)
CSN: [id number]     Arrival date & time 02/02/16  1325 History   First MD Initiated Contact with Patient 02/02/16 1454     Chief Complaint  Patient presents with  . Abdominal Pain  . Back Pain  . Possible Pregnancy   (Consider location/radiation/quality/duration/timing/severity/associated sxs/prior Treatment) HPI  She is a 35 year old woman here for evaluation of pelvic pain. She states for the last 1-2 weeks she has been having intermittent crampy pelvic pain. This is associated with a small amount of vaginal discharge, odor, and itching. She also reports some urinary frequency, but no dysuria. She will sometimes have a little bit of pain in her lower back. No diarrhea or constipation. She has had a new sexual partner. There've been a few episodes of unprotected sex. She is taking birth control pills, but did have to double up on several missed doses last month. She also reports feeling some breast tenderness.  Past Medical History  Diagnosis Date  . Febrile illness, acute 01/2011    Hospitalized for 6 days  . Herpes 2008  . Seasonal allergies   . Asthma     inhaler rarely used - twice year  . Anxiety     no meds  . Hx of bronchitis 09/2012  . Renal stone 08/26/2014  . Depression     no current Tx.   Past Surgical History  Procedure Laterality Date  . Cesarean section  2000 & 2004    "1st one I dilated 1cm, then the 2nd one was a rpt  . Hernia repair    . Wisdom tooth extraction    . Tmj arthroplasty    . Hysteroscopy N/A 03/24/2013    Procedure: HYSTEROSCOPY / Dilitation and curettage/endometrial curettings;  Surgeon: Lavonia Drafts, MD;  Location: [facility];  Service: Gynecology;  Laterality: N/A;  Diagnostic hysteroscopy  . Cystoscopy with retrograde pyelogram, ureteroscopy and stent placement Left 08/26/2014    Procedure: CYSTOSCOPY WITH RETROGRADE PYELOGRAM, URETEROSCOPY, LASER, STONE EXTRACTION WITH BASKET;  Surgeon: Raynelle Bring, MD;  Location: [facility];  Service:  Urology;  Laterality: Left;   Family History  Problem Relation Age of Onset  . Hypertension Mother   . Diabetes Mother    Social History  Substance Use Topics  . Smoking status: Never Smoker   . Smokeless tobacco: Never Used  . Alcohol Use: 0.5 oz/week    1 Standard drinks or equivalent per week     Comment: occsasional   OB History    Gravida Para Term Preterm AB TAB SAB Ectopic Multiple Living   3 2 1 1 1  1   2      Review of Systems As in history of present illness Allergies  Flagyl; Latex; and Penicillins  Home Medications   Prior to Admission medications   Medication Sig Start Date End Date Taking? Authorizing Provider  doxycycline (VIBRAMYCIN) 100 MG capsule Take 1 capsule (100 mg total) by mouth 2 (two) times daily. 02/02/16   Melony Overly, MD  ibuprofen (ADVIL,MOTRIN) 600 MG tablet Take 1 tablet (600 mg total) by mouth every 6 (six) hours as needed. 01/20/15   Luvenia Redden, PA-C   Meds Ordered and Administered this Visit   Medications  cefTRIAXone (ROCEPHIN) injection 250 mg (not administered)  azithromycin (ZITHROMAX) tablet 1,000 mg (not administered)    BP 120/76 mmHg  Pulse 69  Temp(Src) 98.6 F (37 C) (Oral)  Resp 16  SpO2 100% No data found.   Physical Exam  Constitutional: She is  oriented to person, place, and time. She appears well-developed and well-nourished. No distress.  Cardiovascular: Normal rate.   Pulmonary/Chest: Effort normal.  Genitourinary: There is no rash on the right labia. There is no rash on the left labia. Uterus is tender. Cervix exhibits motion tenderness. Cervix exhibits no discharge. Right adnexum displays no tenderness. Left adnexum displays no tenderness. No bleeding in the vagina. No foreign body around the vagina. Vaginal discharge (white) found.  Neurological: She is alert and oriented to person, place, and time.    ED Course  Procedures (including critical care time)  Labs Review Labs Reviewed  POCT URINALYSIS DIP  (DEVICE) - Abnormal; Notable for the following:    Hgb urine dipstick TRACE (*)    All other components within normal limits  URINE CULTURE  POCT PREGNANCY, URINE  CERVICOVAGINAL ANCILLARY ONLY    Imaging Review No results found.    MDM   1. Pelvic pain in female   2. Vaginal discharge    Pregnancy test is negative. Concern for PID. Will treat with azithromycin and Rocephin here. Prescription for doxycycline to go home with. I suspect her period has been off due to having to double up on her birth control pills. I expect this to return to normal in the next cycle or 2. Follow-up as needed.    Melony Overly, MD 02/02/16 [phone]

## 2016-02-03 LAB — CERVICOVAGINAL ANCILLARY ONLY
Chlamydia: NEGATIVE
Neisseria Gonorrhea: NEGATIVE
WET PREP (BD AFFIRM): NEGATIVE

## 2016-02-04 LAB — URINE CULTURE

## 2016-02-10 ENCOUNTER — Telehealth (HOSPITAL_COMMUNITY): Payer: Self-pay | Admitting: Emergency Medicine

## 2016-02-10 NOTE — ED Notes (Signed)
Called pt and notified of recent lab results from visit 3/8 Pt ID'd properly... Reports sx are still the same but the back pain has subsided  Per Dr. Valere Dross,  Please let patient know that tests for gonorrhea/chlamydia and for yeast (candida), gardnerella (bacterial vaginosis), and trichomonas, were negative. Recheck or followup pcp/Bernard Ruthann Cancer for persistent symptoms.  Urine cx is pending. LM  Adv pt to f/u w/Dr. Ruthann Cancer for persistent vag d/c... Pt reports she will Pt verb understanding

## 2016-03-14 ENCOUNTER — Encounter (HOSPITAL_BASED_OUTPATIENT_CLINIC_OR_DEPARTMENT_OTHER): Payer: Self-pay | Admitting: *Deleted

## 2016-03-14 DIAGNOSIS — N739 Female pelvic inflammatory disease, unspecified: Secondary | ICD-10-CM | POA: Diagnosis not present

## 2016-03-14 DIAGNOSIS — Z79899 Other long term (current) drug therapy: Secondary | ICD-10-CM | POA: Diagnosis not present

## 2016-03-14 DIAGNOSIS — J45909 Unspecified asthma, uncomplicated: Secondary | ICD-10-CM | POA: Insufficient documentation

## 2016-03-14 DIAGNOSIS — F329 Major depressive disorder, single episode, unspecified: Secondary | ICD-10-CM | POA: Insufficient documentation

## 2016-03-14 DIAGNOSIS — J029 Acute pharyngitis, unspecified: Secondary | ICD-10-CM | POA: Diagnosis present

## 2016-03-14 LAB — URINALYSIS, ROUTINE W REFLEX MICROSCOPIC
Bilirubin Urine: NEGATIVE
GLUCOSE, UA: NEGATIVE mg/dL
Hgb urine dipstick: NEGATIVE
Ketones, ur: NEGATIVE mg/dL
LEUKOCYTES UA: NEGATIVE
NITRITE: NEGATIVE
PH: 6.5 (ref 5.0–8.0)
Protein, ur: NEGATIVE mg/dL
SPECIFIC GRAVITY, URINE: 1.029 (ref 1.005–1.030)

## 2016-03-14 LAB — RAPID STREP SCREEN (MED CTR MEBANE ONLY): STREPTOCOCCUS, GROUP A SCREEN (DIRECT): NEGATIVE

## 2016-03-14 LAB — PREGNANCY, URINE: Preg Test, Ur: NEGATIVE

## 2016-03-14 NOTE — ED Notes (Addendum)
Pt c/o sore throat  and pelvic x 3 days "after having sex and oral sex . "

## 2016-03-15 ENCOUNTER — Emergency Department (HOSPITAL_BASED_OUTPATIENT_CLINIC_OR_DEPARTMENT_OTHER)
Admission: EM | Admit: 2016-03-15 | Discharge: 2016-03-15 | Disposition: A | Discharge status: Home / Self Care | Payer: Medicaid Other | Attending: Emergency Medicine | Admitting: Emergency Medicine

## 2016-03-15 DIAGNOSIS — N730 Acute parametritis and pelvic cellulitis: Secondary | ICD-10-CM

## 2016-03-15 DIAGNOSIS — J029 Acute pharyngitis, unspecified: Secondary | ICD-10-CM

## 2016-03-15 LAB — WET PREP, GENITAL
CLUE CELLS WET PREP: NONE SEEN
SPERM: NONE SEEN
TRICH WET PREP: NONE SEEN
YEAST WET PREP: NONE SEEN

## 2016-03-15 MED ORDER — DOXYCYCLINE HYCLATE 100 MG PO CAPS: 100.0000 mg | ORAL_CAPSULE | Freq: Two times a day (BID) | ORAL | Status: DC

## 2016-03-15 MED ORDER — CEFTRIAXONE SODIUM 250 MG IJ SOLR
250.0000 mg | Freq: Once | INTRAMUSCULAR | Status: AC
  Administered 2016-03-15: 250 mg via INTRAMUSCULAR
  Filled 2016-03-15: qty 250

## 2016-03-15 MED ORDER — LIDOCAINE HCL (PF) 1 % IJ SOLN
INTRAMUSCULAR | Status: AC
  Administered 2016-03-15: 1.2 mL
  Filled 2016-03-15: qty 5

## 2016-03-15 MED ORDER — DOXYCYCLINE HYCLATE 100 MG PO TABS
100.0000 mg | ORAL_TABLET | Freq: Once | ORAL | Status: AC
  Administered 2016-03-15: 100 mg via ORAL
  Filled 2016-03-15: qty 1

## 2016-03-15 MED ORDER — PROMETHAZINE HCL 25 MG PO TABS: 25.0000 mg | ORAL_TABLET | Freq: Four times a day (QID) | ORAL | Status: DC | PRN

## 2016-03-15 MED ORDER — METRONIDAZOLE 500 MG PO TABS: 500.0000 mg | ORAL_TABLET | Freq: Two times a day (BID) | ORAL | Status: DC

## 2016-03-15 NOTE — ED Notes (Signed)
MD at bedside. 

## 2016-03-15 NOTE — ED Provider Notes (Signed)
CSN: [id number]     Arrival date & time 03/14/16  2218 History   First MD Initiated Contact with Patient 03/15/16 0123     Chief Complaint  Patient presents with  . Sore Throat     (Consider location/radiation/quality/duration/timing/severity/associated sxs/prior Treatment) HPI Comments: 35 year old female with history of asthma, depression, kidney stones who presents with pelvic pain and sore throat. Patient reports a three-day history of sore throat after having oral sex. She also notes 3 days of pelvic pain associated with vaginal discharge. She had vaginal intercourse, no condom. She states she has felt hot and chilled today but is not aware of any fevers. No vomiting, diarrhea, cough, nasal congestion, or other cold symptoms.  Patient is a 35 y.o. female presenting with pharyngitis. The history is provided by the patient.  Sore Throat    Past Medical History  Diagnosis Date  . Febrile illness, acute 01/2011    Hospitalized for 6 days  . Herpes 2008  . Seasonal allergies   . Asthma     inhaler rarely used - twice year  . Anxiety     no meds  . Hx of bronchitis 09/2012  . Renal stone 08/26/2014  . Depression     no current Tx.   Past Surgical History  Procedure Laterality Date  . Cesarean section  2000 & 2004    "1st one I dilated 1cm, then the 2nd one was a rpt  . Hernia repair    . Wisdom tooth extraction    . Tmj arthroplasty    . Hysteroscopy N/A 03/24/2013    Procedure: HYSTEROSCOPY / Dilitation and curettage/endometrial curettings;  Surgeon: Lavonia Drafts, MD;  Location: [facility];  Service: Gynecology;  Laterality: N/A;  Diagnostic hysteroscopy  . Cystoscopy with retrograde pyelogram, ureteroscopy and stent placement Left 08/26/2014    Procedure: CYSTOSCOPY WITH RETROGRADE PYELOGRAM, URETEROSCOPY, LASER, STONE EXTRACTION WITH BASKET;  Surgeon: Raynelle Bring, MD;  Location: [facility];  Service: Urology;  Laterality: Left;   Family History  Problem Relation Age of  Onset  . Hypertension Mother   . Diabetes Mother    Social History  Substance Use Topics  . Smoking status: Never Smoker   . Smokeless tobacco: Never Used  . Alcohol Use: 0.5 oz/week    1 Standard drinks or equivalent per week     Comment: occsasional   OB History    Gravida Para Term Preterm AB TAB SAB Ectopic Multiple Living   3 2 1 1 1  1   2      Review of Systems 10 Systems reviewed and are negative for acute change except as noted in the HPI.    Allergies  Flagyl; Latex; and Penicillins  Home Medications   Prior to Admission medications   Medication Sig Start Date End Date Taking? Authorizing Provider  doxycycline (VIBRAMYCIN) 100 MG capsule Take 1 capsule (100 mg total) by mouth 2 (two) times daily. 03/15/16   Sharlett Iles, MD  metroNIDAZOLE (FLAGYL) 500 MG tablet Take 1 tablet (500 mg total) by mouth 2 (two) times daily. 03/15/16   Sharlett Iles, MD  promethazine (PHENERGAN) 25 MG tablet Take 1 tablet (25 mg total) by mouth every 6 (six) hours as needed for nausea or vomiting. 03/15/16   Wenda Overland Little, MD   BP 115/70 mmHg  Pulse 83  Temp(Src) 98.8 F (37.1 C)  Resp 16  Ht 5\' 3"  (1.6 m)  Wt 210 lb (95.255 kg)  BMI 37.21 kg/m2  SpO2  100%  LMP 02/20/2016 Physical Exam  Constitutional: She is oriented to person, place, and time. She appears well-developed and well-nourished. No distress.  HENT:  Head: Normocephalic and atraumatic.  Mouth/Throat: No oropharyngeal exudate.  Moist mucous membranes, a few scattered palatal petechiae  Eyes: Conjunctivae are normal. Pupils are equal, round, and reactive to light.  Neck: Neck supple.  Cardiovascular: Normal rate, regular rhythm and normal heart sounds.   No murmur heard. Pulmonary/Chest: Effort normal and breath sounds normal.  Abdominal: Soft. Bowel sounds are normal. She exhibits no distension. There is tenderness.  Mild suprapubic TTP  Genitourinary: Vaginal discharge found.  Copious white,  frothy, foul-smelling vaginal discharge, mild cervical motion tenderness, no adnexal tenderness  Musculoskeletal: She exhibits no edema.  Neurological: She is alert and oriented to person, place, and time.  Fluent speech  Skin: Skin is warm and dry. No rash noted.  Psychiatric: She has a normal mood and affect. Judgment normal.  Nursing note and vitals reviewed.    Chaperone was present during exam.   ED Course  Procedures (including critical care time) Labs Review Labs Reviewed  WET PREP, GENITAL - Abnormal; Notable for the following:    WBC, Wet Prep HPF POC FEW (*)    All other components within normal limits  RAPID STREP SCREEN (NOT AT [facility])  CULTURE, GROUP A STREP ([place])  PREGNANCY, URINE  URINALYSIS, ROUTINE W REFLEX MICROSCOPIC (NOT AT [facility])  GC/CHLAMYDIA PROBE AMP ([place]) NOT AT [facility]  GC/CHLAMYDIA PROBE AMP ([place]) NOT AT [facility]    Imaging Review No results found. I have personally reviewed and evaluated these lab results as part of my medical decision-making.  Medications  cefTRIAXone (ROCEPHIN) injection 250 mg (250 mg Intramuscular Given 03/15/16 0205)  doxycycline (VIBRA-TABS) tablet 100 mg (100 mg Oral Given 03/15/16 0206)  lidocaine (PF) (XYLOCAINE) 1 % injection (1.2 mLs  Given 03/15/16 0205)     MDM   Final diagnoses:  PID (acute pelvic inflammatory disease)  Sore throat   Patient with 3 days of sore throat as well as pelvic pain associated with vaginal discharge, after having vaginal and oral sex. On exam, patient was asleep and comfortable. Vital signs unremarkable. She had mild suprapubic abdominal tenderness. Pelvic exam showed copious white, frothy, foul-smelling vaginal discharge with mild cervical motion tenderness, suspicious for PID. No adnexal tenderness or unilateral pain to suggest TOA. Rapid strep negative. UA unremarkable. Sent GC and chlamydia cervical and oral swabs.  Wet prep unremarkable but I am concerned about the foul odor and  copious amount of discharge; differential includes Trichomonas and BV. Gave the patient ceftriaxone and dose of doxycycline and discussed PID treatment. The patient states that previously she has had nausea and vomiting with Flagyl but no other symptoms. She stated that she was willing to try to take the medication "to be on the safe side" and I provided her with Phenergan to use as needed. Discussed avoidance of alcohol. Emphasized importance of finishing full course of doxycycline and Flagyl as well as importance of having partner tested and treated prior to any further intercourse. I reviewed return precautions including any worsening symptoms, intractable vomiting, fever, or severe abdominal pain. Patient voiced understanding and was discharged in satisfactory condition.  Sharlett Iles, MD 03/15/16 [phone]

## 2016-03-15 NOTE — Discharge Instructions (Signed)
Pelvic Inflammatory Disease °Pelvic inflammatory disease (PID) refers to an infection in some or all of the female organs. The infection can be in the uterus, ovaries, fallopian tubes, or the surrounding tissues in the pelvis. PID can cause abdominal or pelvic pain that comes on suddenly (acute pelvic pain). PID is a serious infection because it can lead to lasting (chronic) pelvic pain or the inability to have children (infertility). °CAUSES °This condition is most often caused by an infection that is spread during sexual contact. However, the infection can also be caused by the normal bacteria that are found in the vaginal tissues if these bacteria travel upward into the reproductive organs. PID can also occur following: °· The birth of a baby. °· A miscarriage. °· An abortion. °· Major pelvic surgery. °· The use of an intrauterine device (IUD). °· A sexual assault. °RISK FACTORS °This condition is more likely to develop in women who: °· Are younger than 33 years of age. °· Are sexually active at a young age. °· Use nonbarrier contraception. °· Have multiple sexual partners. °· Have sex with someone who has symptoms of an STD (sexually transmitted disease). °· Use oral contraception. °At times, certain behaviors can also increase the possibility of getting PID, such as: °· Using a vaginal douche. °· Having an IUD in place. °SYMPTOMS °Symptoms of this condition include: °· Abdominal or pelvic pain. °· Fever. °· Chills. °· Abnormal vaginal discharge. °· Abnormal uterine bleeding. °· Unusual pain shortly after the end of a menstrual period. °· Painful urination. °· Pain with sexual intercourse. °· Nausea and vomiting. °DIAGNOSIS °To diagnose this condition, your health care provider will do a physical exam and take your medical history. A pelvic exam typically reveals great tenderness in the uterus and the surrounding pelvic tissues. You may also have tests, such as: °· Lab tests, including a pregnancy test, blood  tests, and urine test. °· Culture tests of the vagina and cervix to check for an STD. °· Ultrasound. °· A laparoscopic procedure to look inside the pelvis. °· Examining vaginal secretions under a microscope. °TREATMENT °Treatment for this condition may involve one or more approaches. °· Antibiotic medicines may be prescribed to be taken by mouth. °· Sexual partners may need to be treated if the infection is caused by an STD. °· For more severe cases, hospitalization may be needed to give antibiotics directly into a vein through an IV tube. °· Surgery may be needed if other treatments do not help, but this is rare. °It may take weeks until you are completely well. If you are diagnosed with PID, you should also be checked for human immunodeficiency virus (HIV). Your health care provider may test you for infection again 3 months after treatment. You should not have unprotected sex. °HOME CARE INSTRUCTIONS °· Take over-the-counter and prescription medicines only as told by your health care provider. °· If you were prescribed an antibiotic medicine, take it as told by your health care provider. Do not stop taking the antibiotic even if you start to feel better. °· Do not have sexual intercourse until treatment is completed or as told by your health care provider. If PID is confirmed, your recent sexual partners will need treatment, especially if you had unprotected sex. °· Keep all follow-up visits as told by your health care provider. This is important. °SEEK MEDICAL CARE IF: °· You have increased or abnormal vaginal discharge. °· Your pain does not improve. °· You vomit. °· You have a fever. °· You   cannot tolerate your medicines.  Your partner has an STD.  You have pain when you urinate. SEEK IMMEDIATE MEDICAL CARE IF:  You have increased abdominal or pelvic pain.  You have chills.  Your symptoms are not better in 72 hours even with treatment.   This information is not intended to replace advice given to  you by your health care provider. Make sure you discuss any questions you have with your health care provider.   Document Released: 11/13/2005 Document Revised: 08/04/2015 Document Reviewed: 12/21/2014 Elsevier Interactive Patient Education 2016 Else[employer]  Pharyngitis Pharyngitis is redness, pain, and swelling (inflammation) of your pharynx.  CAUSES  Pharyngitis is usually caused by infection. Most of the time, these infections are from viruses (viral) and are part of a cold. However, sometimes pharyngitis is caused by bacteria (bacterial). Pharyngitis can also be caused by allergies. Viral pharyngitis may be spread from person to person by coughing, sneezing, and personal items or utensils (cups, forks, spoons, toothbrushes). Bacterial pharyngitis may be spread from person to person by more intimate contact, such as kissing.  SIGNS AND SYMPTOMS  Symptoms of pharyngitis include:   Sore throat.   Tiredness (fatigue).   Low-grade fever.   Headache.  Joint pain and muscle aches.  Skin rashes.  Swollen lymph nodes.  Plaque-like film on throat or tonsils (often seen with bacterial pharyngitis). DIAGNOSIS  Your health care provider will ask you questions about your illness and your symptoms. Your medical history, along with a physical exam, is often all that is needed to diagnose pharyngitis. Sometimes, a rapid strep test is done. Other lab tests may also be done, depending on the suspected cause.  TREATMENT  Viral pharyngitis will usually get better in 3-4 days without the use of medicine. Bacterial pharyngitis is treated with medicines that kill germs (antibiotics).  HOME CARE INSTRUCTIONS   Drink enough water and fluids to keep your urine clear or pale yellow.   Only take over-the-counter or prescription medicines as directed by your health care provider:   If you are prescribed antibiotics, make sure you finish them even if you start to feel better.   Do not take  aspirin.   Get lots of rest.   Gargle with 8 oz of salt water ( tsp of salt per 1 qt of water) as often as every 1-2 hours to soothe your throat.   Throat lozenges (if you are not at risk for choking) or sprays may be used to soothe your throat. SEEK MEDICAL CARE IF:   You have large, tender lumps in your neck.  You have a rash.  You cough up green, yellow-brown, or bloody spit. SEEK IMMEDIATE MEDICAL CARE IF:   Your neck becomes stiff.  You drool or are unable to swallow liquids.  You vomit or are unable to keep medicines or liquids down.  You have severe pain that does not go away with the use of recommended medicines.  You have trouble breathing (not caused by a stuffy nose). MAKE SURE YOU:   Understand these instructions.  Will watch your condition.  Will get help right away if you are not doing well or get worse.   This information is not intended to replace advice given to you by your health care provider. Make sure you discuss any questions you have with your health care provider.   Document Released: 11/13/2005 Document Revised: 09/03/2013 Document Reviewed: 07/21/2013 Elsevier Interactive Patient Education [employer].

## 2016-03-16 LAB — GC/CHLAMYDIA PROBE AMP (~~LOC~~) NOT AT ARMC
Chlamydia: NEGATIVE
Chlamydia: NEGATIVE
Neisseria Gonorrhea: NEGATIVE
Neisseria Gonorrhea: NEGATIVE

## 2016-03-17 LAB — CULTURE, GROUP A STREP (THRC)

## 2016-04-04 ENCOUNTER — Ambulatory Visit (INDEPENDENT_AMBULATORY_CARE_PROVIDER_SITE_OTHER): Payer: Medicaid Other

## 2016-04-04 ENCOUNTER — Other Ambulatory Visit: Payer: Self-pay | Admitting: Sports Medicine

## 2016-04-04 ENCOUNTER — Encounter: Payer: Self-pay | Admitting: Sports Medicine

## 2016-04-04 ENCOUNTER — Ambulatory Visit (INDEPENDENT_AMBULATORY_CARE_PROVIDER_SITE_OTHER): Payer: Medicaid Other | Admitting: Sports Medicine

## 2016-04-04 VITALS — BP 119/83 | HR 74 | Resp 16 | Ht 63.0 in | Wt 210.0 lb

## 2016-04-04 DIAGNOSIS — M79672 Pain in left foot: Principal | ICD-10-CM

## 2016-04-04 DIAGNOSIS — M722 Plantar fascial fibromatosis: Secondary | ICD-10-CM | POA: Diagnosis not present

## 2016-04-04 DIAGNOSIS — M79671 Pain in right foot: Secondary | ICD-10-CM

## 2016-04-04 MED ORDER — TRIAMCINOLONE ACETONIDE 10 MG/ML IJ SUSP: 10.0000 mg | Freq: Once | INTRAMUSCULAR | Status: DC

## 2016-04-04 MED ORDER — DICLOFENAC SODIUM 75 MG PO TBEC: 75.0000 mg | DELAYED_RELEASE_TABLET | Freq: Two times a day (BID) | ORAL | Status: DC

## 2016-04-04 NOTE — Patient Instructions (Signed)

## 2016-04-04 NOTE — Progress Notes (Signed)
Patient ID: Connie Jenkins, female   DOB: 01-23-1981, 35 y.o.   MRN: [id number] Subjective: Connie Jenkins is a 35 y.o. female patient presents to office with complaint of heel and arch pain on the left and right. Patient admits to post static dyskinesia for several months in duration. Patient has treated this problem with icing and stretching with no relief. 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 Denies any other pedal complaints.   Patient Active Problem List   Diagnosis Date Noted  . Secondary amenorrhea 01/16/2013  . 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 Bacterial vaginosis 07/21/2011  . Abdominal pain 07/21/2011  . Vomiting 07/21/2011  . CMV mononucleosis 02/15/2011  . 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 [DISCONTINUED] DiphenhydrAMINE HCl (BENADRYL ALLERGY PO) Take 2 capsules by mouth daily as needed. Patient took this medication because of itching that occurred from eating fish.     No current facility-administered medications on file prior to visit.    Allergies  Allergen Reactions  . Flagyl [Metronidazole] Nausea And Vomiting  . Latex Itching  . Penicillins Hives and Other (See Comments)    Childhood allergy    Objective: Physical Exam General: The patient is alert and oriented x3 in no acute distress.  Dermatology: Skin is warm, dry and supple bilateral lower extremities. Nails 1-10 are normal. There is no erythema, edema, no eccymosis, no open lesions present. Integument is otherwise unremarkable.  Vascular: Dorsalis Pedis pulse and Posterior Tibial pulse are 2/4 bilateral. Capillary fill time is immediate to all digits.  Neurological: Grossly intact to light touch with an achilles reflex of +2/5 and a  negative Tinel's sign bilateral.  Musculoskeletal: Tenderness to palpation at the medial calcaneal  tubercale and through the insertion of the plantar fascia on the left and right foot. No pain with compression of calcaneus bilateral. No pain with tuning fork to calcaneus bilateral. No pain with calf compression bilateral. There is decreased Ankle joint range of motion bilateral. All other joints range of motion within normal limits bilateral. Strength 5/5 in all groups bilateral. Pes planus foot type bilateral.  Xray, Right and Left foot:  Normal osseous mineralization. Joint spaces preserved. No fracture/dislocation/boney destruction. Minimal calcaneal spur present with mild thickening of plantar fascia. Pes planus and mild hammertoe. No other soft tissue abnormalities or radiopaque foreign bodies.   Assessment and Plan: Problem List Items Addressed This Visit    None    Visit Diagnoses    Foot pain, bilateral    -  Primary    Relevant Medications    diclofenac (VOLTAREN) 75 MG EC tablet    Other Relevant Orders    DG Foot Complete Right    DG Foot Complete Left    Plantar fasciitis, bilateral        Relevant Medications    diclofenac (VOLTAREN) 75 MG EC tablet    triamcinolone acetonide (KENALOG) 10 MG/ML injection 10 mg       -Complete examination performed.  -Xrays reviewed -Discussed with patient in detail the condition of plantar fasciitis, how this occurs and general treatment options. Explained both conservative and surgical treatments.  -After oral consent and aseptic prep, injected a mixture containing 1 ml of 2%  plain lidocaine, 1 ml 0.5% plain marcaine, 0.5 ml of kenalog 10 and 0.5 ml of dexamethasone phosphate into  left and right heels. Post-injection care discussed with patient.  -Rx Diclofenac. Patient reports that she can not take steroids orally because of her IBS.  -Recommended good supportive shoes and advised use of OTC insert. Explained to patient that if these orthoses work well, we will continue with these. If these do not improve her condition and  pain, we  will consider custom molded orthoses. -Applied plantar fascial taping and instructed to keep in place as long as tolerated -Explained and dispensed to patient daily stretching exercises. -Recommend patient to ice affected area 1-2x daily. -Patient to return to office in 4 weeks for follow up or sooner if problems or questions arise.  Landis Martins, DPM

## 2016-04-04 NOTE — Progress Notes (Deleted)
   Subjective:    Patient ID: Connie Jenkins, female    DOB: 1980/12/08, 35 y.o.   MRN: [id number]  HPI    Review of Systems  All other systems reviewed and are negative.      Objective:   Physical Exam        Assessment & Plan:

## 2016-05-02 ENCOUNTER — Ambulatory Visit: Payer: Medicaid Other | Admitting: Sports Medicine

## 2016-05-19 ENCOUNTER — Encounter (HOSPITAL_BASED_OUTPATIENT_CLINIC_OR_DEPARTMENT_OTHER): Payer: Self-pay

## 2016-05-19 ENCOUNTER — Emergency Department (HOSPITAL_BASED_OUTPATIENT_CLINIC_OR_DEPARTMENT_OTHER)
Admission: EM | Admit: 2016-05-19 | Discharge: 2016-05-20 | Disposition: A | Discharge status: Home / Self Care | Payer: Medicaid Other | Attending: Emergency Medicine | Admitting: Emergency Medicine

## 2016-05-19 DIAGNOSIS — N72 Inflammatory disease of cervix uteri: Secondary | ICD-10-CM | POA: Diagnosis not present

## 2016-05-19 DIAGNOSIS — F329 Major depressive disorder, single episode, unspecified: Secondary | ICD-10-CM | POA: Insufficient documentation

## 2016-05-19 DIAGNOSIS — J45909 Unspecified asthma, uncomplicated: Secondary | ICD-10-CM | POA: Diagnosis not present

## 2016-05-19 DIAGNOSIS — N898 Other specified noninflammatory disorders of vagina: Secondary | ICD-10-CM | POA: Diagnosis present

## 2016-05-19 LAB — URINALYSIS, ROUTINE W REFLEX MICROSCOPIC
BILIRUBIN URINE: NEGATIVE
GLUCOSE, UA: NEGATIVE mg/dL
KETONES UR: NEGATIVE mg/dL
LEUKOCYTES UA: NEGATIVE
NITRITE: NEGATIVE
PH: 5.5 (ref 5.0–8.0)
Protein, ur: NEGATIVE mg/dL
SPECIFIC GRAVITY, URINE: 1.029 (ref 1.005–1.030)

## 2016-05-19 LAB — URINE MICROSCOPIC-ADD ON

## 2016-05-19 LAB — PREGNANCY, URINE: Preg Test, Ur: NEGATIVE

## 2016-05-19 NOTE — ED Notes (Signed)
Vaginal d/c x 2 days-pelvic pain x today-NAD-steady gait

## 2016-05-20 ENCOUNTER — Encounter (HOSPITAL_BASED_OUTPATIENT_CLINIC_OR_DEPARTMENT_OTHER): Payer: Self-pay | Admitting: Emergency Medicine

## 2016-05-20 LAB — WET PREP, GENITAL
SPERM: NONE SEEN
Trich, Wet Prep: NONE SEEN
Yeast Wet Prep HPF POC: NONE SEEN

## 2016-05-20 MED ORDER — AZITHROMYCIN 250 MG PO TABS
1000.0000 mg | ORAL_TABLET | Freq: Once | ORAL | Status: AC
Start: 2016-05-20 — End: 2016-05-20
  Administered 2016-05-20: 1000 mg via ORAL
  Filled 2016-05-20: qty 4

## 2016-05-20 MED ORDER — LIDOCAINE HCL (PF) 1 % IJ SOLN
INTRAMUSCULAR | Status: AC
  Administered 2016-05-20: 0.9 mL
  Filled 2016-05-20: qty 5

## 2016-05-20 MED ORDER — CEFTRIAXONE SODIUM 250 MG IJ SOLR
250.0000 mg | Freq: Once | INTRAMUSCULAR | Status: AC
  Administered 2016-05-20: 250 mg via INTRAMUSCULAR
  Filled 2016-05-20: qty 250

## 2016-05-20 NOTE — Discharge Instructions (Signed)
Cervicitis Cervicitis is a soreness and swelling (inflammation) of the cervix. Your cervix is located at the bottom of your uterus. It opens up to the vagina. CAUSES   Sexually transmitted infections (STIs).   Allergic reaction.   Medicines or birth control devices that are put in the vagina.   Injury to the cervix.   Bacterial infections.  RISK FACTORS You are at greater risk if you:  Have unprotected sexual intercourse.  Have sexual intercourse with many partners.  Began sexual intercourse at an early age.  Have a history of STIs. SYMPTOMS  There may be no symptoms. If symptoms occur, they may include:   Gray, white, yellow, or bad-smelling vaginal discharge.   Pain or itching of the area outside the vagina.   Painful sexual intercourse.   Lower abdominal or lower back pain, especially during intercourse.   Frequent urination.   Abnormal vaginal bleeding between periods, after sexual intercourse, or after menopause.   Pressure or a heavy feeling in the pelvis.  DIAGNOSIS  Diagnosis is made after a pelvic exam. Other tests may include:   Examination of any discharge under a microscope (wet prep).   A Pap test.  TREATMENT  Treatment will depend on the cause of cervicitis. If it is caused by an STI, both you and your partner will need to be treated. Antibiotic medicines will be given.  HOME CARE INSTRUCTIONS   Do not have sexual intercourse until your health care provider says it is okay.   Do not have sexual intercourse until your partner has been treated, if your cervicitis is caused by an STI.   Take your antibiotics as directed. Finish them even if you start to feel better.  SEEK MEDICAL CARE IF:  Your symptoms come back.   You have a fever.  MAKE SURE YOU:   Understand these instructions.  Will watch your condition.  Will get help right away if you are not doing well or get worse.   This information is not intended to replace  advice given to you by your health care provider. Make sure you discuss any questions you have with your health care provider.   Document Released: 11/13/2005 Document Revised: 11/18/2013 Document Reviewed: 05/07/2013 Elsevier Interactive Patient Education [employer].

## 2016-05-20 NOTE — ED Provider Notes (Signed)
CSN: [id number]     Arrival date & time 05/19/16  2224 History   First MD Initiated Contact with Patient 05/20/16 0006     Chief Complaint  Patient presents with  . Vaginal Discharge     (Consider location/radiation/quality/duration/timing/severity/associated sxs/prior Treatment) HPI  This is a 35 year old female who complains of a vaginal discharge for the past 2 days. She states the discharge is "sour smelling"and watery. She has also developed a sharp, burning pain in her pelvis over the past 24 hours. This pain is moderate to severe and worse with movement. She states her symptoms are just like when she had PID 2 months ago. She has had nausea but no vomiting.  Past Medical History  Diagnosis Date  . Febrile illness, acute 01/2011    Hospitalized for 6 days  . Herpes 2008  . Seasonal allergies   . Asthma     inhaler rarely used - twice year  . Anxiety     no meds  . Hx of bronchitis 09/2012  . Renal stone 08/26/2014  . Depression     no current Tx.   Past Surgical History  Procedure Laterality Date  . Cesarean section  2000 & 2004    "1st one I dilated 1cm, then the 2nd one was a rpt  . Hernia repair    . Wisdom tooth extraction    . Tmj arthroplasty    . Hysteroscopy N/A 03/24/2013    Procedure: HYSTEROSCOPY / Dilitation and curettage/endometrial curettings;  Surgeon: Lavonia Drafts, MD;  Location: [facility];  Service: Gynecology;  Laterality: N/A;  Diagnostic hysteroscopy  . Cystoscopy with retrograde pyelogram, ureteroscopy and stent placement Left 08/26/2014    Procedure: CYSTOSCOPY WITH RETROGRADE PYELOGRAM, URETEROSCOPY, LASER, STONE EXTRACTION WITH BASKET;  Surgeon: Raynelle Bring, MD;  Location: [facility];  Service: Urology;  Laterality: Left;   Family History  Problem Relation Age of Onset  . Hypertension Mother   . Diabetes Mother    Social History  Substance Use Topics  . Smoking status: Never Smoker   . Smokeless tobacco: Never Used  . Alcohol Use: 0.5  oz/week    1 Standard drinks or equivalent per week     Comment: occsasional   OB History    Gravida Para Term Preterm AB TAB SAB Ectopic Multiple Living   3 2 1 1 1  1   2      Review of Systems  All other systems reviewed and are negative.   Allergies  Flagyl; Latex; and Penicillins  Home Medications   Prior to Admission medications   Not on File   BP 109/72 mmHg  Pulse 75  Temp(Src) 98.6 F (37 C) (Oral)  Resp 16  Ht 5\' 3"  (1.6 m)  Wt 210 lb (95.255 kg)  BMI 37.21 kg/m2  SpO2 99%  LMP 05/08/2016   Physical Exam  General: Well-developed, well-nourished fele in no acute distress; appearance consistent with age of record HENT: normocephalic; atraumatic Eyes: pupils equal, round and reactive to light; extraocular muscles intact Neck: supple Heart: regular rate and rhythm Lungs: clear to auscultation bilaterally Abdomen: soft; nondistended; suprapubic tenderness; no masses or hepatosplenomegaly; bowel sounds present GU: Normal external genitalia; no vaginal bleeding; yellow tinged vaginal discharge; cervical motion tenderness; no adnexal tenderness Extremities: No deformity; full range of motion; pulses normal Neurologic: Awake, alert and oriented; motor function intact in all extremities and symmetric; no facial droop Skin: Warm and dry Psychiatric: Normal mood and affect    ED Course  Procedures (including critical care time)   MDM   Nursing notes and vitals signs, including pulse oximetry, reviewed.  Summary of this visit's results, reviewed by myself:  Labs:  Results for orders placed or performed during the hospital encounter of 05/19/16 (from the past 24 hour(s))  Pregnancy, urine     Status: None   Collection Time: 05/19/16 10:30 PM  Result Value Ref Range   Preg Test, Ur NEGATIVE NEGATIVE  Urinalysis, Routine w reflex microscopic (not at [facility])     Status: Abnormal   Collection Time: 05/19/16 10:30 PM  Result Value Ref Range   Color, Urine YELLOW  YELLOW   APPearance CLEAR CLEAR   Specific Gravity, Urine 1.029 1.005 - 1.030   pH 5.5 5.0 - 8.0   Glucose, UA NEGATIVE NEGATIVE mg/dL   Hgb urine dipstick TRACE (A) NEGATIVE   Bilirubin Urine NEGATIVE NEGATIVE   Ketones, ur NEGATIVE NEGATIVE mg/dL   Protein, ur NEGATIVE NEGATIVE mg/dL   Nitrite NEGATIVE NEGATIVE   Leukocytes, UA NEGATIVE NEGATIVE  Urine microscopic-add on     Status: Abnormal   Collection Time: 05/19/16 10:30 PM  Result Value Ref Range   Squamous Epithelial / LPF 0-5 (A) NONE SEEN   WBC, UA 0-5 0 - 5 WBC/hpf   RBC / HPF 0-5 0 - 5 RBC/hpf   Bacteria, UA FEW (A) NONE SEEN   Urine-Other MUCOUS PRESENT   Wet prep, genital     Status: Abnormal   Collection Time: 05/20/16 12:15 AM  Result Value Ref Range   Yeast Wet Prep HPF POC NONE SEEN NONE SEEN   Trich, Wet Prep NONE SEEN NONE SEEN   Clue Cells Wet Prep HPF POC PRESENT (A) NONE SEEN   WBC, Wet Prep HPF POC FEW (A) NONE SEEN   Sperm NONE SEEN     Will treat for cervicitis.    Shanon Rosser, MD 05/20/16 [phone]

## 2016-05-21 LAB — HIV ANTIBODY (ROUTINE TESTING W REFLEX): HIV Screen 4th Generation wRfx: NONREACTIVE

## 2016-05-22 ENCOUNTER — Other Ambulatory Visit: Payer: Self-pay | Admitting: Sports Medicine

## 2016-05-22 ENCOUNTER — Encounter: Payer: Self-pay | Admitting: Sports Medicine

## 2016-05-22 ENCOUNTER — Ambulatory Visit (INDEPENDENT_AMBULATORY_CARE_PROVIDER_SITE_OTHER): Payer: Medicaid Other | Admitting: Sports Medicine

## 2016-05-22 DIAGNOSIS — M79671 Pain in right foot: Secondary | ICD-10-CM

## 2016-05-22 DIAGNOSIS — M79672 Pain in left foot: Secondary | ICD-10-CM

## 2016-05-22 DIAGNOSIS — M722 Plantar fascial fibromatosis: Secondary | ICD-10-CM

## 2016-05-22 LAB — GC/CHLAMYDIA PROBE AMP (~~LOC~~) NOT AT ARMC
CHLAMYDIA, DNA PROBE: NEGATIVE
NEISSERIA GONORRHEA: NEGATIVE

## 2016-05-22 MED ORDER — MELOXICAM 15 MG PO TABS: 15.0000 mg | ORAL_TABLET | Freq: Every day | ORAL | Status: DC

## 2016-05-22 NOTE — Progress Notes (Signed)
Patient ID: Connie Jenkins, female   DOB: 04-29-1981, 35 y.o.   MRN: [id number] Subjective: Connie Jenkins is a 35 y.o. female returns to office for follow up evaluation after Left and Right heel injection for plantar fasciitis, injection #1 administered >6 weeks ago. Patient states that the injection seems to help her pain;decreased in frequency to the area. Reports that she never was able to get the Diclofenac medication due to prior auth. Patient denies any recent changes in medications or new problems since last visit.   Patient Active Problem List   Diagnosis Date Noted  . Secondary amenorrhea 01/16/2013  . 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 Bacterial vaginosis 07/21/2011  . Abdominal pain 07/21/2011  . Vomiting 07/21/2011  . CMV mononucleosis 02/15/2011  . 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 [DISCONTINUED] DiphenhydrAMINE HCl (BENADRYL ALLERGY PO) Take 2 capsules by mouth daily as needed. Patient took this medication because of itching that occurred from eating fish.     Current Facility-Administered Medications on File Prior to Visit  Medication Dose Route Frequency Provider Last Rate Last Dose  . triamcinolone acetonide (KENALOG) 10 MG/ML injection 10 mg  10 mg Other Once Landis Martins, DPM        Allergies  Allergen Reactions  . Flagyl [Metronidazole] Nausea And Vomiting  . Latex Itching  . Penicillins Hives and Other (See Comments)    Childhood allergy    Objective:   General:  Alert and oriented x 3, in no acute distress  Dermatology: Skin is warm, dry, and supple bilateral. Nails are within normal limits. There is no lower extremity erythema, no eccymosis, no open lesions present bilateral.   Vascular: Dorsalis Pedis and Posterior Tibial pedal pulses are 2/4 bilateral. + hair growth noted bilateral. Capillary Fill Time is 3 seconds in all digits. No varicosities, No edema  bilateral lower extremities.   Neurological: Sensation grossly intact to light touch with an achilles reflex of +2 and a  negative Tinel's sign bilateral. Vibratory, sharp/dull, Semmes Weinstein Monofilament within normal limits.   Musculoskeletal: There is decreased tenderness to palpation at the medial calcaneal tubercale and through the insertion of the plantar fascia on the Left>right foot. No pain with compression to calcaneus or application of tuning fork. There is decreased Ankle joint range of motion bilateral. All other jointsrange of motion  within normal limits bilateral. Pes planus foot type bilateral. Strength 5/5 bilateral.   Assessment and Plan: Problem List Items Addressed This Visit    None    Visit Diagnoses    Plantar fasciitis, bilateral    -  Primary    Relevant Medications    meloxicam (MOBIC) 15 MG tablet    Foot pain, bilateral        Relevant Medications    meloxicam (MOBIC) 15 MG tablet      -Complete examination performed.  -Previous x-rays reviewed. -Discussed with patient in detail the condition of plantar fasciitis, how this  occurs related to the foot type of the patient and general treatment options. - Patient declined repeat injection at this time. -Rx Mobic to take as instructed -Continue with stretching, icing, good supportive shoes, inserts daily.  -Discussed long term care and reocurrence; will closely monitor; if fails to improve will consider other treatment modalities.  -Patient to return to office as needed for follow up or sooner if problems or questions arise.  Landis Martins, DPM

## 2016-05-24 ENCOUNTER — Telehealth (HOSPITAL_BASED_OUTPATIENT_CLINIC_OR_DEPARTMENT_OTHER): Payer: Self-pay | Admitting: Emergency Medicine

## 2016-08-21 ENCOUNTER — Encounter: Payer: Self-pay | Admitting: *Deleted

## 2016-10-06 ENCOUNTER — Inpatient Hospital Stay (HOSPITAL_COMMUNITY)
Admission: AD | Admit: 2016-10-06 | Discharge: 2016-10-06 | Disposition: A | Discharge status: Home / Self Care | Payer: Medicaid Other | Source: Ambulatory Visit | Attending: Obstetrics & Gynecology | Admitting: Obstetrics & Gynecology

## 2016-10-06 ENCOUNTER — Other Ambulatory Visit: Payer: Self-pay | Admitting: Advanced Practice Midwife

## 2016-10-06 ENCOUNTER — Encounter (HOSPITAL_COMMUNITY): Payer: Self-pay | Admitting: *Deleted

## 2016-10-06 DIAGNOSIS — Z880 Allergy status to penicillin: Secondary | ICD-10-CM | POA: Diagnosis not present

## 2016-10-06 DIAGNOSIS — N926 Irregular menstruation, unspecified: Secondary | ICD-10-CM | POA: Insufficient documentation

## 2016-10-06 DIAGNOSIS — R102 Pelvic and perineal pain: Secondary | ICD-10-CM | POA: Insufficient documentation

## 2016-10-06 DIAGNOSIS — R109 Unspecified abdominal pain: Secondary | ICD-10-CM | POA: Diagnosis present

## 2016-10-06 LAB — URINE MICROSCOPIC-ADD ON

## 2016-10-06 LAB — URINALYSIS, ROUTINE W REFLEX MICROSCOPIC
Bilirubin Urine: NEGATIVE
Glucose, UA: NEGATIVE mg/dL
Ketones, ur: NEGATIVE mg/dL
Leukocytes, UA: NEGATIVE
NITRITE: NEGATIVE
PH: 5.5 (ref 5.0–8.0)
Protein, ur: NEGATIVE mg/dL

## 2016-10-06 LAB — CBC
HCT: 38.3 % (ref 36.0–46.0)
Hemoglobin: 12.7 g/dL (ref 12.0–15.0)
MCH: 27.5 pg (ref 26.0–34.0)
MCHC: 33.2 g/dL (ref 30.0–36.0)
MCV: 82.9 fL (ref 78.0–100.0)
PLATELETS: 280 10*3/uL (ref 150–400)
RBC: 4.62 MIL/uL (ref 3.87–5.11)
RDW: 13.7 % (ref 11.5–15.5)
WBC: 8.1 10*3/uL (ref 4.0–10.5)

## 2016-10-06 LAB — WET PREP, GENITAL
CLUE CELLS WET PREP: NONE SEEN
Sperm: NONE SEEN
Trich, Wet Prep: NONE SEEN
YEAST WET PREP: NONE SEEN

## 2016-10-06 LAB — POCT PREGNANCY, URINE: PREG TEST UR: NEGATIVE

## 2016-10-06 MED ORDER — IBUPROFEN 600 MG PO TABS: 600.0000 mg | ORAL_TABLET | Freq: Four times a day (QID) | ORAL | 1 refills | Status: DC | PRN

## 2016-10-06 MED ORDER — MEDROXYPROGESTERONE ACETATE 10 MG PO TABS: 10.0000 mg | ORAL_TABLET | Freq: Every day | ORAL | 2 refills | Status: DC

## 2016-10-06 NOTE — MAU Note (Signed)
11 days late for her period; UPT is negative; c/o severe abdominal cramping for past 2-3 days; c/o vaginal discharge for past 2 days;

## 2016-10-06 NOTE — Discharge Instructions (Signed)

## 2016-10-06 NOTE — MAU Provider Note (Signed)
Chief Complaint:  Abdominal Pain; Possible Pregnancy; and Vaginal Discharge   First Provider Initiated Contact with Patient 10/06/16 1802     HPI: Connie Jenkins is a 35 y.o. [id number] who presents to maternity admissions reporting abdominal cramping for the past few days. Has had increased vaginal discharge also.  No bleeding. Period is late.Last few months her period has come every 18 days.  She reports no vaginal bleeding, vaginal itching/burning, urinary symptoms, h/a, dizziness, n/v, or fever/chills.  Does request STD testing  Abdominal Pain  This is a new problem. The current episode started in the past 7 days. The onset quality is gradual. The problem occurs intermittently. The problem has been waxing and waning. The pain is located in the LLQ, RLQ and suprapubic region. The pain is mild. The quality of the pain is cramping and aching. The abdominal pain does not radiate. Pertinent negatives include no constipation, diarrhea, fever, myalgias, nausea or vomiting. Nothing aggravates the pain. The pain is relieved by nothing. She has tried nothing for the symptoms.   RN Note: 11 days late for her period; UPT is negative; c/o severe abdominal cramping for past 2-3 days; c/o vaginal discharge for past 2 days;  Past Medical History: Past Medical History:  Diagnosis Date  . Anxiety    no meds  . Asthma    inhaler rarely used - twice year  . Depression    no current Tx.  . Febrile illness, acute 01/2011   Hospitalized for 6 days  . Herpes 2008  . Hx of bronchitis 09/2012  . Renal stone 08/26/2014  . Seasonal allergies     Past obstetric history: OB History  Gravida Para Term Preterm AB Living  3 2 1 1 1 2   SAB TAB Ectopic Multiple Live Births  1       2    # Outcome Date GA Lbr Len/2nd Weight Sex Delivery Anes PTL Lv  3 SAB 2013          2 Term 09/18/03    F CS-LTranv Spinal  LIV  1 Preterm 08/03/99    F CS-LTranv EPI  LIV      Past Surgical History: Past Surgical History:   Procedure Laterality Date  . CESAREAN SECTION  2000 & 2004   "1st one I dilated 1cm, then the 2nd one was a rpt  . CYSTOSCOPY WITH RETROGRADE PYELOGRAM, URETEROSCOPY AND STENT PLACEMENT Left 08/26/2014   Procedure: CYSTOSCOPY WITH RETROGRADE PYELOGRAM, URETEROSCOPY, LASER, STONE EXTRACTION WITH BASKET;  Surgeon: Raynelle Bring, MD;  Location: [facility];  Service: Urology;  Laterality: Left;  . HERNIA REPAIR    . HYSTEROSCOPY N/A 03/24/2013   Procedure: HYSTEROSCOPY / Dilitation and curettage/endometrial curettings;  Surgeon: Lavonia Drafts, MD;  Location: [facility];  Service: Gynecology;  Laterality: N/A;  Diagnostic hysteroscopy  . TMJ ARTHROPLASTY    . WISDOM TOOTH EXTRACTION      Family History: Family History  Problem Relation Age of Onset  . Hypertension Mother   . Diabetes Mother     Social History: Social History  Substance Use Topics  . Smoking status: Never Smoker  . Smokeless tobacco: Never Used  . Alcohol use 0.5 oz/week    1 Standard drinks or equivalent per week     Comment: occsasional    Allergies:  Allergies  Allergen Reactions  . Flagyl [Metronidazole] Nausea And Vomiting  . Latex Itching  . Penicillins Hives and Other (See Comments)    Childhood allergy  Meds:  Facility-Administered Medications Prior to Admission  Medication Dose Route Frequency Provider Last Rate Last Dose  . triamcinolone acetonide (KENALOG) 10 MG/ML injection 10 mg  10 mg Other Once Landis Martins, DPM       Prescriptions Prior to Admission  Medication Sig Dispense Refill Last Dose  . meloxicam (MOBIC) 15 MG tablet TAKE 1 TABLET(15 MG) BY MOUTH DAILY 90 tablet 0   . Norethindrone Acetate-Ethinyl Estrad-FE (LOESTRIN 24 FE) 1-20 MG-MCG(24) tablet      . predniSONE (DELTASONE) 20 MG tablet      . prochlorperazine (COMPAZINE) 10 MG tablet Take by mouth.     . SUMAtriptan (IMITREX) 100 MG tablet Take by mouth.     . triamcinolone ointment (KENALOG) 0.1 % Apply topically.     .  valACYclovir (VALTREX) 500 MG tablet Take by mouth.       I have reviewed patient's Past Medical Hx, Surgical Hx, Family Hx, Social Hx, medications and allergies.  ROS:  Review of Systems  Constitutional: Negative for fever.  Gastrointestinal: Positive for abdominal pain. Negative for constipation, diarrhea, nausea and vomiting.  Musculoskeletal: Negative for myalgias.   Other systems negative     Physical Exam  Patient Vitals for the past 24 hrs:  BP Temp Temp src Pulse Resp  10/06/16 1801 115/70 98.4 F (36.9 C) Oral 80 16   Constitutional: Well-developed, well-nourished female in no acute distress.  Cardiovascular: normal rate and rhythm, no ectopy audible, S1 & S2 heard, no murmur Respiratory: normal effort, no distress. Lungs CTAB with no wheezes or crackles GI: Abd soft,  Mildly tender over lower abdomen  Nondistended.  No rebound, No guarding.  Bowel Sounds audible  MS: Extremities nontender, no edema, normal ROM Neurologic: Alert and oriented x 4.   Grossly nonfocal. GU: Neg CVAT. Skin:  Warm and Dry Psych:  Affect appropriate.  PELVIC EXAM: Cervix pink, visually closed, without lesion, scant white creamy discharge, vaginal walls and external genitalia normal Bimanual exam: Cervix firm, anterior, neg CMT, uterus mildly tender, nonenlarged, adnexa without tenderness, enlargement, or mass    Labs:    Results for orders placed or performed during the hospital encounter of 10/06/16 (from the past 72 hour(s))  Urinalysis, Routine w reflex microscopic (not at [facility])     Status: Abnormal   Collection Time: 10/06/16  5:50 PM  Result Value Ref Range   Color, Urine YELLOW YELLOW   APPearance CLEAR CLEAR   Specific Gravity, Urine >1.030 (H) 1.005 - 1.030   pH 5.5 5.0 - 8.0   Glucose, UA NEGATIVE NEGATIVE mg/dL   Hgb urine dipstick SMALL (A) NEGATIVE   Bilirubin Urine NEGATIVE NEGATIVE   Ketones, ur NEGATIVE NEGATIVE mg/dL   Protein, ur NEGATIVE NEGATIVE mg/dL   Nitrite  NEGATIVE NEGATIVE   Leukocytes, UA NEGATIVE NEGATIVE  Urine microscopic-add on     Status: Abnormal   Collection Time: 10/06/16  5:50 PM  Result Value Ref Range   Squamous Epithelial / LPF 6-30 (A) NONE SEEN   WBC, UA 0-5 0 - 5 WBC/hpf   RBC / HPF 0-5 0 - 5 RBC/hpf   Bacteria, UA MANY (A) NONE SEEN   Urine-Other STARCH CRYSTALS PRESENT   Pregnancy, urine POC     Status: None   Collection Time: 10/06/16  5:58 PM  Result Value Ref Range   Preg Test, Ur NEGATIVE NEGATIVE    Comment:        THE SENSITIVITY OF THIS METHODOLOGY IS >24 mIU/mL  Wet prep, genital     Status: Abnormal   Collection Time: 10/06/16  6:45 PM  Result Value Ref Range   Yeast Wet Prep HPF POC NONE SEEN NONE SEEN   Trich, Wet Prep NONE SEEN NONE SEEN   Clue Cells Wet Prep HPF POC NONE SEEN NONE SEEN   WBC, Wet Prep HPF POC MODERATE (A) NONE SEEN    Comment: MANY BACTERIA SEEN   Sperm NONE SEEN   HIV antibody (routine testing) (NOT for [facility])     Status: None   Collection Time: 10/06/16  7:13 PM  Result Value Ref Range   HIV Screen 4th Generation wRfx Non Reactive Non Reactive    Comment: (NOTE) Performed At: [facility] [place], [place] [id number] Lindon Romp MD [id number]   CBC     Status: None   Collection Time: 10/06/16  7:13 PM  Result Value Ref Range   WBC 8.1 4.0 - 10.5 K/uL   RBC 4.62 3.87 - 5.11 MIL/uL   Hemoglobin 12.7 12.0 - 15.0 g/dL   HCT 38.3 36.0 - 46.0 %   MCV 82.9 78.0 - 100.0 fL   MCH 27.5 26.0 - 34.0 pg   MCHC 33.2 30.0 - 36.0 g/dL   RDW 13.7 11.5 - 15.5 %   Platelets 280 150 - 400 K/uL     Imaging:  No results found.  MAU Course/MDM: I have ordered labs as follows: UPT, UA Imaging ordered: none for now, but may need pelvic US after provera challenge Results reviewed. Suspect this may be anovulatory or abnormal ovulation. Cycles for past year only 18 days. Heavy bleeding with menses. Could represent a ? polyp.   Will try a Provera challenge and  see if that will reset her system and clear out the endometrium. Treatments in MAU included Cultures and wet prep..  Declines Toradol injection. Will refer to [facility] for followup evaluation with MD after Provera challenge   Pt stable at time of discharge.  Assessment: Irregular menses Pelvic pain, possibly dysmenorrhea with potential oncoming menses  Plan: Discharge home Recommend Start provera this week, and take for 10 days to effect a withdrawal bleed. Hopefully this will reset her cycles.  Rx sent for Provera  for abnormal bleeding Rx Ibuprofen for dysmenorrhea   Encouraged to return here or to other Urgent Care/ED if she develops worsening of symptoms, increase in pain, fever, or other concerning symptoms.   Hansel Feinstein CNM, MSN Certified Nurse-Midwife 10/06/2016 6:03 PM

## 2016-10-07 LAB — HIV ANTIBODY (ROUTINE TESTING W REFLEX): HIV Screen 4th Generation wRfx: NONREACTIVE

## 2016-10-09 LAB — GC/CHLAMYDIA PROBE AMP (~~LOC~~) NOT AT ARMC
Chlamydia: NEGATIVE
NEISSERIA GONORRHEA: NEGATIVE

## 2016-10-17 ENCOUNTER — Ambulatory Visit (INDEPENDENT_AMBULATORY_CARE_PROVIDER_SITE_OTHER): Payer: Medicaid Other | Admitting: General Practice

## 2016-10-17 ENCOUNTER — Encounter (HOSPITAL_COMMUNITY): Payer: Self-pay | Admitting: *Deleted

## 2016-10-17 ENCOUNTER — Inpatient Hospital Stay (HOSPITAL_COMMUNITY)
Admission: AD | Admit: 2016-10-17 | Discharge: 2016-10-17 | Disposition: A | Discharge status: Home / Self Care | Payer: Medicaid Other | Source: Ambulatory Visit | Attending: Obstetrics & Gynecology | Admitting: Obstetrics & Gynecology

## 2016-10-17 DIAGNOSIS — N912 Amenorrhea, unspecified: Secondary | ICD-10-CM

## 2016-10-17 DIAGNOSIS — Z3201 Encounter for pregnancy test, result positive: Secondary | ICD-10-CM | POA: Diagnosis present

## 2016-10-17 NOTE — Progress Notes (Signed)
Patient here today for UPT. UPT +. Patient reports lmp 09/15/16 EDD 06/22/16 [redacted]w[redacted]d. Patient denies taking any medication/vitamins. Encouraged patient to start PNV & care around 10-11 weeks. Patient verbalized understanding & had no questions

## 2016-10-17 NOTE — MAU Note (Signed)
Pt had pos HPT today, had a neg one here on the 10th.  Denies any pain or bleeding, does have some breast tenderness.  Has occasional lower abd cramping, none now.

## 2016-10-17 NOTE — MAU Provider Note (Signed)
Ms.Connie Jenkins is a 35 y.o. [id number] at Unknown who presents to MAU today for pregnancy verification. The patient denies abdominal pain or vaginal bleeding today. LMP 09/15/16.   BP 119/78 (BP Location: Right Arm)   Pulse 91   Temp 98.4 F (36.9 C) (Oral)   Resp 18   LMP 09/15/2016   CONSTITUTIONAL: Well-developed, well-nourished female in no acute distress.  MUSCULOSKELETAL: Normal range of motion.  CARDIOVASCULAR: Regular heart rate RESPIRATORY: Normal effort NEUROLOGICAL: Alert and oriented to person, place, and time.  SKIN: Skin is warm and dry. No rash noted. Not diaphoretic. No erythema. No pallor. PSYCH: Normal mood and affect. Normal behavior. Normal judgment and thought content.  A: Amenorrhea  P: Discharge home Patient advised to present to [facility] for pregnancy confirmation First trimester precautions discussed Patient may return to MAU as needed or if her condition were to change or worsen   Luvenia Redden, PA-C  10/17/2016 3:56 PM

## 2016-10-17 NOTE — Discharge Instructions (Signed)
Secondary Amenorrhea Secondary amenorrhea is the stopping of menstrual flow for 3-6 months in a female who has previously had periods. There are many possible causes. Most of these causes are not serious. Usually, treating the underlying problem causing the loss of menses will return your periods to normal. What are the causes? Some common and uncommon causes of not menstruating include:  Malnutrition.  Low blood sugar (hypoglycemia).  Polycystic ovary disease.  Stress or fear.  Breastfeeding.  Hormone imbalance.  Ovarian failure.  Medicines.  Extreme obesity.  Cystic fibrosis.  Low body weight or drastic weight reduction from any cause.  Early menopause.  Removal of ovaries or uterus.  Contraceptives.  Illness.  Long-term (chronic) illnesses.  Cushing syndrome.  Thyroid problems.  Birth control pills, patches, or vaginal rings for birth control. What increases the risk? You may be at greater risk of secondary amenorrhea if:  You have a family history of this condition.  You have an eating disorder.  You do athletic training. How is this diagnosed? A diagnosis is made by your health care provider taking a medical history and doing a physical exam. This will include a pelvic exam to check for problems with your reproductive organs. Pregnancy must be ruled out. Often, numerous blood tests are done to measure different hormones in the body. Urine testing may be done. Specialized exams (ultrasound, CT scan, MRI, or hysteroscopy) may have to be done as well as measuring the body mass index (BMI). How is this treated? Treatment depends on the cause of the amenorrhea. If an eating disorder is present, this can be treated with an adequate diet and therapy. Chronic illnesses may improve with treatment of the illness. Amenorrhea may be corrected with medicines, lifestyle changes, or surgery. If the amenorrhea cannot be corrected, it is sometimes possible to create a false  menstruation with medicines. Follow these instructions at home:  Maintain a healthy diet.  Manage weight problems.  Exercise regularly but not excessively.  Get adequate sleep.  Manage stress.  Be aware of changes in your menstrual cycle. Keep a record of when your periods occur. Note the date your period starts, how long it lasts, and any problems. Contact a health care provider if: Your symptoms do not get better with treatment. This information is not intended to replace advice given to you by your health care provider. Make sure you discuss any questions you have with your health care provider. Document Released: 12/25/2006 Document Revised: 04/20/2016 Document Reviewed: 05/01/2013 Elsevier Interactive Patient Education  2017 Elsevier Inc.  

## 2016-10-18 LAB — POCT PREGNANCY, URINE: Preg Test, Ur: POSITIVE — AB

## 2016-10-25 ENCOUNTER — Inpatient Hospital Stay (HOSPITAL_COMMUNITY): Payer: Medicaid Other

## 2016-10-25 ENCOUNTER — Encounter (HOSPITAL_COMMUNITY): Payer: Self-pay | Admitting: *Deleted

## 2016-10-25 ENCOUNTER — Inpatient Hospital Stay (HOSPITAL_COMMUNITY)
Admission: AD | Admit: 2016-10-25 | Discharge: 2016-10-26 | Disposition: A | Discharge status: Home / Self Care | Payer: Medicaid Other | Source: Ambulatory Visit | Attending: Obstetrics & Gynecology | Admitting: Obstetrics & Gynecology

## 2016-10-25 DIAGNOSIS — Z880 Allergy status to penicillin: Secondary | ICD-10-CM | POA: Diagnosis not present

## 2016-10-25 DIAGNOSIS — Z87442 Personal history of urinary calculi: Secondary | ICD-10-CM | POA: Diagnosis not present

## 2016-10-25 DIAGNOSIS — Z8619 Personal history of other infectious and parasitic diseases: Secondary | ICD-10-CM | POA: Insufficient documentation

## 2016-10-25 DIAGNOSIS — Z91040 Latex allergy status: Secondary | ICD-10-CM | POA: Insufficient documentation

## 2016-10-25 DIAGNOSIS — Z960 Presence of urogenital implants: Secondary | ICD-10-CM | POA: Insufficient documentation

## 2016-10-25 DIAGNOSIS — Z8249 Family history of ischemic heart disease and other diseases of the circulatory system: Secondary | ICD-10-CM | POA: Diagnosis not present

## 2016-10-25 DIAGNOSIS — Z881 Allergy status to other antibiotic agents status: Secondary | ICD-10-CM | POA: Insufficient documentation

## 2016-10-25 DIAGNOSIS — R102 Pelvic and perineal pain: Secondary | ICD-10-CM | POA: Insufficient documentation

## 2016-10-25 DIAGNOSIS — Z3490 Encounter for supervision of normal pregnancy, unspecified, unspecified trimester: Secondary | ICD-10-CM

## 2016-10-25 DIAGNOSIS — O26891 Other specified pregnancy related conditions, first trimester: Secondary | ICD-10-CM | POA: Diagnosis not present

## 2016-10-25 DIAGNOSIS — Z3A01 Less than 8 weeks gestation of pregnancy: Secondary | ICD-10-CM | POA: Diagnosis not present

## 2016-10-25 DIAGNOSIS — Z833 Family history of diabetes mellitus: Secondary | ICD-10-CM | POA: Diagnosis not present

## 2016-10-25 LAB — URINE MICROSCOPIC-ADD ON

## 2016-10-25 LAB — URINALYSIS, ROUTINE W REFLEX MICROSCOPIC
BILIRUBIN URINE: NEGATIVE
Glucose, UA: NEGATIVE mg/dL
Ketones, ur: NEGATIVE mg/dL
Leukocytes, UA: NEGATIVE
Nitrite: NEGATIVE
PH: 6 (ref 5.0–8.0)
Protein, ur: NEGATIVE mg/dL
SPECIFIC GRAVITY, URINE: 1.025 (ref 1.005–1.030)

## 2016-10-25 LAB — CBC
HEMATOCRIT: 36 % (ref 36.0–46.0)
Hemoglobin: 11.8 g/dL — ABNORMAL LOW (ref 12.0–15.0)
MCH: 27.1 pg (ref 26.0–34.0)
MCHC: 32.8 g/dL (ref 30.0–36.0)
MCV: 82.6 fL (ref 78.0–100.0)
PLATELETS: 317 10*3/uL (ref 150–400)
RBC: 4.36 MIL/uL (ref 3.87–5.11)
RDW: 13.4 % (ref 11.5–15.5)
WBC: 9.4 10*3/uL (ref 4.0–10.5)

## 2016-10-25 NOTE — MAU Note (Signed)
Pt reports lower abd and lower back pain x 24 hours, worsening tonight. Denies bleeding. Positive preg test.

## 2016-10-25 NOTE — MAU Provider Note (Signed)
History     CSN: [id number]  Arrival date and time: 10/25/16 2228   First Provider Initiated Contact with Patient 10/25/16 2327      Chief Complaint  Patient presents with  . Pelvic Pain   Pelvic Pain  The patient's primary symptoms include pelvic pain. This is a new problem. The current episode started yesterday. The problem occurs constantly. The problem has been unchanged. Pain severity now: 7/10  The problem affects the left side. She is pregnant. Associated symptoms include abdominal pain and nausea. Pertinent negatives include no chills, constipation, diarrhea, dysuria, fever, frequency, urgency or vomiting. The vaginal discharge was normal. There has been no bleeding. Nothing aggravates the symptoms. She has tried acetaminophen for the symptoms. The treatment provided no relief. Her menstrual history has been irregular (LNMP middle of september. Had some spotting in october. ).   Past Medical History:  Diagnosis Date  . Anxiety    no meds  . Asthma    inhaler rarely used - twice year  . Depression    no current Tx.  . Febrile illness, acute 01/2011   Hospitalized for 6 days  . Herpes 2008  . Hx of bronchitis 09/2012  . Renal stone 08/26/2014  . Seasonal allergies     Past Surgical History:  Procedure Laterality Date  . CESAREAN SECTION  2000 & 2004   "1st one I dilated 1cm, then the 2nd one was a rpt  . CYSTOSCOPY WITH RETROGRADE PYELOGRAM, URETEROSCOPY AND STENT PLACEMENT Left 08/26/2014   Procedure: CYSTOSCOPY WITH RETROGRADE PYELOGRAM, URETEROSCOPY, LASER, STONE EXTRACTION WITH BASKET;  Surgeon: Raynelle Bring, MD;  Location: [facility];  Service: Urology;  Laterality: Left;  . HERNIA REPAIR    . HYSTEROSCOPY N/A 03/24/2013   Procedure: HYSTEROSCOPY / Dilitation and curettage/endometrial curettings;  Surgeon: Lavonia Drafts, MD;  Location: [facility];  Service: Gynecology;  Laterality: N/A;  Diagnostic hysteroscopy  . TMJ ARTHROPLASTY    . WISDOM TOOTH EXTRACTION       Family History  Problem Relation Age of Onset  . Hypertension Mother   . Diabetes Mother     Social History  Substance Use Topics  . Smoking status: Never Smoker  . Smokeless tobacco: Never Used  . Alcohol use 0.5 oz/week    1 Standard drinks or equivalent per week     Comment: occsasional    Allergies:  Allergies  Allergen Reactions  . Flagyl [Metronidazole] Nausea And Vomiting  . Latex Itching  . Penicillins Hives and Other (See Comments)    Childhood allergy    Prescriptions Prior to Admission  Medication Sig Dispense Refill Last Dose  . SUMAtriptan (IMITREX) 100 MG tablet Take by mouth.     . valACYclovir (VALTREX) 500 MG tablet Take by mouth.       Review of Systems  Constitutional: Negative for chills and fever.  Gastrointestinal: Positive for abdominal pain and nausea. Negative for constipation, diarrhea and vomiting.  Genitourinary: Positive for pelvic pain. Negative for dysuria, frequency and urgency.   Physical Exam   Blood pressure 103/64, pulse 70, temperature 98.6 F (37 C), temperature source Oral, resp. rate 16, height 5\' 3"  (1.6 m), weight 99.3 kg (219 lb), last menstrual period 09/15/2016, SpO2 99 %.  Physical Exam  Nursing note and vitals reviewed. Constitutional: She is oriented to person, place, and time. She appears well-developed and well-nourished. No distress.  HENT:  Head: Normocephalic.  Cardiovascular: Normal rate.   Respiratory: Effort normal.  GI: Soft. There is no tenderness.  There is no rebound.  Neurological: She is alert and oriented to person, place, and time.  Skin: Skin is warm and dry.  Psychiatric: She has a normal mood and affect.   Results for orders placed or performed during the hospital encounter of 10/25/16 (from the past 24 hour(s))  Urinalysis, Routine w reflex microscopic (not at [facility])     Status: Abnormal   Collection Time: 10/25/16 11:22 PM  Result Value Ref Range   Color, Urine YELLOW YELLOW   APPearance  CLEAR CLEAR   Specific Gravity, Urine 1.025 1.005 - 1.030   pH 6.0 5.0 - 8.0   Glucose, UA NEGATIVE NEGATIVE mg/dL   Hgb urine dipstick TRACE (A) NEGATIVE   Bilirubin Urine NEGATIVE NEGATIVE   Ketones, ur NEGATIVE NEGATIVE mg/dL   Protein, ur NEGATIVE NEGATIVE mg/dL   Nitrite NEGATIVE NEGATIVE   Leukocytes, UA NEGATIVE NEGATIVE  Urine microscopic-add on     Status: Abnormal   Collection Time: 10/25/16 11:22 PM  Result Value Ref Range   Squamous Epithelial / LPF 6-30 (A) NONE SEEN   WBC, UA 0-5 0 - 5 WBC/hpf   RBC / HPF 0-5 0 - 5 RBC/hpf   Bacteria, UA FEW (A) NONE SEEN   Urine-Other MUCOUS PRESENT   Wet prep, genital     Status: Abnormal   Collection Time: 10/25/16 11:31 PM  Result Value Ref Range   Yeast Wet Prep HPF POC NONE SEEN NONE SEEN   Trich, Wet Prep NONE SEEN NONE SEEN   Clue Cells Wet Prep HPF POC NONE SEEN NONE SEEN   WBC, Wet Prep HPF POC FEW (A) NONE SEEN   Sperm NONE SEEN   CBC     Status: Abnormal   Collection Time: 10/25/16 11:37 PM  Result Value Ref Range   WBC 9.4 4.0 - 10.5 K/uL   RBC 4.36 3.87 - 5.11 MIL/uL   Hemoglobin 11.8 (L) 12.0 - 15.0 g/dL   HCT 36.0 36.0 - 46.0 %   MCV 82.6 78.0 - 100.0 fL   MCH 27.1 26.0 - 34.0 pg   MCHC 32.8 30.0 - 36.0 g/dL   RDW 13.4 11.5 - 15.5 %   Platelets 317 150 - 400 K/uL  ABO/Rh     Status: None   Collection Time: 10/25/16 11:37 PM  Result Value Ref Range   ABO/RH(D) A POS    US Ob Comp Less 14 Wks  Result Date: 10/26/2016 CLINICAL DATA:  Acute onset of pelvic and lower back pain. Initial encounter. EXAM: OBSTETRIC <14 WK [place] AND TRANSVAGINAL OB US TECHNIQUE: Both transabdominal and transvaginal ultrasound examinations were performed for complete evaluation of the gestation as well as the maternal uterus, adnexal regions, and pelvic cul-de-sac. Transvaginal technique was performed to assess early pregnancy. COMPARISON:  Pelvic ultrasound performed 01/20/2015 FINDINGS: Intrauterine gestational sac: Single; visualized  and normal in shape. Yolk sac:  Yes Embryo:  Yes Cardiac Activity: Yes Heart Rate: 127  bpm CRL:  2.7 mm   5 w   5 d                  [place] EDC: 06/22/2017 Subchorionic hemorrhage:  None visualized. Maternal uterus/adnexae: The uterus is otherwise unremarkable. The ovaries are within normal limits. The right ovary measures 2.5 x 1.8 x 1.5 cm, while the left ovary measures 3.2 x 2.5 x 2.4 cm. No suspicious adnexal masses are seen; there is no evidence for ovarian torsion. No free fluid is seen within the pelvic cul-de-sac.  IMPRESSION: Single live intrauterine pregnancy noted, with a crown-rump length of 3 mm, corresponding to a gestational age of [redacted] weeks 5 days. This matches the gestational age by LMP, reflecting an estimated date of delivery of June 22, 2017. Electronically Signed   By: Garald Balding M.D.   On: 10/26/2016 00:26   US Ob Transvaginal  Result Date: 10/26/2016 CLINICAL DATA:  Acute onset of pelvic and lower back pain. Initial encounter. EXAM: OBSTETRIC <14 WK [place] AND TRANSVAGINAL OB US TECHNIQUE: Both transabdominal and transvaginal ultrasound examinations were performed for complete evaluation of the gestation as well as the maternal uterus, adnexal regions, and pelvic cul-de-sac. Transvaginal technique was performed to assess early pregnancy. COMPARISON:  Pelvic ultrasound performed 01/20/2015 FINDINGS: Intrauterine gestational sac: Single; visualized and normal in shape. Yolk sac:  Yes Embryo:  Yes Cardiac Activity: Yes Heart Rate: 127  bpm CRL:  2.7 mm   5 w   5 d                  [place] EDC: 06/22/2017 Subchorionic hemorrhage:  None visualized. Maternal uterus/adnexae: The uterus is otherwise unremarkable. The ovaries are within normal limits. The right ovary measures 2.5 x 1.8 x 1.5 cm, while the left ovary measures 3.2 x 2.5 x 2.4 cm. No suspicious adnexal masses are seen; there is no evidence for ovarian torsion. No free fluid is seen within the pelvic cul-de-sac. IMPRESSION: Single live  intrauterine pregnancy noted, with a crown-rump length of 3 mm, corresponding to a gestational age of [redacted] weeks 5 days. This matches the gestational age by LMP, reflecting an estimated date of delivery of June 22, 2017. Electronically Signed   By: Garald Balding M.D.   On: 10/26/2016 00:26   MAU Course  Procedures  MDM  Assessment and Plan   1. Intrauterine pregnancy   2. Pelvic pain affecting pregnancy in first trimester, antepartum   3. [redacted] weeks gestation of pregnancy    DC home Comfort measures reviewed  1st Trimester precautions  RX: none  Return to MAU as needed FU with OB as planned  Follow-up Information    [facility] Follow up.   Specialty:  Obstetrics and Gynecology Contact information: [address], [address] [place] [phone]           Mathis Bud 10/25/2016, 11:29 PM

## 2016-10-26 DIAGNOSIS — Z3A01 Less than 8 weeks gestation of pregnancy: Secondary | ICD-10-CM | POA: Diagnosis not present

## 2016-10-26 DIAGNOSIS — R102 Pelvic and perineal pain: Secondary | ICD-10-CM

## 2016-10-26 DIAGNOSIS — O26891 Other specified pregnancy related conditions, first trimester: Secondary | ICD-10-CM

## 2016-10-26 LAB — WET PREP, GENITAL
CLUE CELLS WET PREP: NONE SEEN
SPERM: NONE SEEN
Trich, Wet Prep: NONE SEEN
Yeast Wet Prep HPF POC: NONE SEEN

## 2016-10-26 LAB — RPR: RPR Ser Ql: NONREACTIVE

## 2016-10-26 LAB — HIV ANTIBODY (ROUTINE TESTING W REFLEX): HIV Screen 4th Generation wRfx: NONREACTIVE

## 2016-10-26 LAB — GC/CHLAMYDIA PROBE AMP (~~LOC~~) NOT AT ARMC
CHLAMYDIA, DNA PROBE: NEGATIVE
NEISSERIA GONORRHEA: NEGATIVE

## 2016-10-26 LAB — HCG, QUANTITATIVE, PREGNANCY: HCG, BETA CHAIN, QUANT, S: 35293 m[IU]/mL — AB (ref <5)

## 2016-10-26 LAB — ABO/RH: ABO/RH(D): A POS

## 2016-10-26 NOTE — Discharge Instructions (Signed)
First Trimester of Pregnancy  The first trimester of pregnancy is from week 1 until the end of week 12 (months 1 through 3). A week after a sperm fertilizes an egg, the egg will implant on the wall of the uterus. This embryo will begin to develop into a baby. Genes from you and your partner are forming the baby. The female genes determine whether the baby is a boy or a girl. At 6-8 weeks, the eyes and face are formed, and the heartbeat can be seen on ultrasound. At the end of 12 weeks, all the baby's organs are formed.   Now that you are pregnant, you will want to do everything you can to have a healthy baby. Two of the most important things are to get good prenatal care and to follow your health care provider's instructions. Prenatal care is all the medical care you receive before the baby's birth. This care will help prevent, find, and treat any problems during the pregnancy and childbirth.  BODY CHANGES  Your body goes through many changes during pregnancy. The changes vary from woman to woman.   · You may gain or lose a couple of pounds at first.  · You may feel sick to your stomach (nauseous) and throw up (vomit). If the vomiting is uncontrollable, call your health care provider.  · You may tire easily.  · You may develop headaches that can be relieved by medicines approved by your health care provider.  · You may urinate more often. Painful urination may mean you have a bladder infection.  · You may develop heartburn as a result of your pregnancy.  · You may develop constipation because certain hormones are causing the muscles that push waste through your intestines to slow down.  · You may develop hemorrhoids or swollen, bulging veins (varicose veins).  · Your breasts may begin to grow larger and become tender. Your nipples may stick out more, and the tissue that surrounds them (areola) may become darker.  · Your gums may bleed and may be sensitive to brushing and flossing.   · Dark spots or blotches (chloasma, mask of pregnancy) may develop on your face. This will likely fade after the baby is born.  · Your menstrual periods will stop.  · You may have a loss of appetite.  · You may develop cravings for certain kinds of food.  · You may have changes in your emotions from day to day, such as being excited to be pregnant or being concerned that something may go wrong with the pregnancy and baby.  · You may have more vivid and strange dreams.  · You may have changes in your hair. These can include thickening of your hair, rapid growth, and changes in texture. Some women also have hair loss during or after pregnancy, or hair that feels dry or thin. Your hair will most likely return to normal after your baby is born.  WHAT TO EXPECT AT YOUR PRENATAL VISITS  During a routine prenatal visit:  · You will be weighed to make sure you and the baby are growing normally.  · Your blood pressure will be taken.  · Your abdomen will be measured to track your baby's growth.  · The fetal heartbeat will be listened to starting around week 10 or 12 of your pregnancy.  · Test results from any previous visits will be discussed.  Your health care provider may ask you:  · How you are feeling.  · If you   are feeling the baby move.  · If you have had any abnormal symptoms, such as leaking fluid, bleeding, severe headaches, or abdominal cramping.  · If you are using any tobacco products, including cigarettes, chewing tobacco, and electronic cigarettes.  · If you have any questions.  Other tests that may be performed during your first trimester include:  · Blood tests to find your blood type and to check for the presence of any previous infections. They will also be used to check for low iron levels (anemia) and Rh antibodies. Later in the pregnancy, blood tests for diabetes will be done along with other tests if problems develop.  · Urine tests to check for infections, diabetes, or protein in the urine.   · An ultrasound to confirm the proper growth and development of the baby.  · An amniocentesis to check for possible genetic problems.  · Fetal screens for spina bifida and Down syndrome.  · You may need other tests to make sure you and the baby are doing well.  · HIV (human immunodeficiency virus) testing. Routine prenatal testing includes screening for HIV, unless you choose not to have this test.  HOME CARE INSTRUCTIONS   Medicines  · Follow your health care provider's instructions regarding medicine use. Specific medicines may be either safe or unsafe to take during pregnancy.  · Take your prenatal vitamins as directed.  · If you develop constipation, try taking a stool softener if your health care provider approves.  Diet  · Eat regular, well-balanced meals. Choose a variety of foods, such as meat or vegetable-based protein, fish, milk and low-fat dairy products, vegetables, fruits, and whole grain breads and cereals. Your health care provider will help you determine the amount of weight gain that is right for you.  · Avoid raw meat and uncooked cheese. These carry germs that can cause birth defects in the baby.  · Eating four or five small meals rather than three large meals a day may help relieve nausea and vomiting. If you start to feel nauseous, eating a few soda crackers can be helpful. Drinking liquids between meals instead of during meals also seems to help nausea and vomiting.  · If you develop constipation, eat more high-fiber foods, such as fresh vegetables or fruit and whole grains. Drink enough fluids to keep your urine clear or pale yellow.  Activity and Exercise  · Exercise only as directed by your health care provider. Exercising will help you:    Control your weight.    Stay in shape.    Be prepared for labor and delivery.  · Experiencing pain or cramping in the lower abdomen or low back is a good sign that you should stop exercising. Check with your health care provider  before continuing normal exercises.  · Try to avoid standing for long periods of time. Move your legs often if you must stand in one place for a long time.  · Avoid heavy lifting.  · Wear low-heeled shoes, and practice good posture.  · You may continue to have sex unless your health care provider directs you otherwise.  Relief of Pain or Discomfort  · Wear a good support bra for breast tenderness.    · Take warm sitz baths to soothe any pain or discomfort caused by hemorrhoids. Use hemorrhoid cream if your health care provider approves.    · Rest with your legs elevated if you have leg cramps or low back pain.  · If you develop varicose veins in your   visits as directed by your health care provider. Safety   Wear your seat belt at all times when driving.  Make a list of emergency phone numbers, including numbers for family, friends, the hospital, and police and fire departments. General Tips   Ask your health care provider for a referral to a local prenatal education class. Begin classes no later than at the beginning of month 6 of your pregnancy.  Ask for help if you have counseling or nutritional needs during pregnancy. Your health care provider can offer advice or refer you to specialists for help with various needs.  Do not use hot tubs, steam rooms, or saunas.  Do not douche or use tampons or scented sanitary pads.  Do not cross your legs for long periods of time.  Avoid cat litter boxes and soil used by cats. These carry germs that can cause birth defects in the baby and possibly loss of the fetus by miscarriage or stillbirth.  Avoid all smoking, herbs, alcohol, and medicines not  prescribed by your health care provider. Chemicals in these affect the formation and growth of the baby.  Do not use any tobacco products, including cigarettes, chewing tobacco, and electronic cigarettes. If you need help quitting, ask your health care provider. You may receive counseling support and other resources to help you quit.  Schedule a dentist appointment. At home, brush your teeth with a soft toothbrush and be gentle when you floss. SEEK MEDICAL CARE IF:   You have dizziness.  You have mild pelvic cramps, pelvic pressure, or nagging pain in the abdominal area.  You have persistent nausea, vomiting, or diarrhea.  You have a bad smelling vaginal discharge.  You have pain with urination.  You notice increased swelling in your face, hands, legs, or ankles. SEEK IMMEDIATE MEDICAL CARE IF:   You have a fever.  You are leaking fluid from your vagina.  You have spotting or bleeding from your vagina.  You have severe abdominal cramping or pain.  You have rapid weight gain or loss.  You vomit blood or material that looks like coffee grounds.  You are exposed to [place] measles and have never had them.  You are exposed to fifth disease or chickenpox.  You develop a severe headache.  You have shortness of breath.  You have any kind of trauma, such as from a fall or a car accident. This information is not intended to replace advice given to you by your health care provider. Make sure you discuss any questions you have with your health care provider. Document Released: 11/07/2001 Document Revised: 12/04/2014 Document Reviewed: 09/23/2013 Elsevier Interactive Patient Education  2017 [place] Medications in Pregnancy   Acne: Benzoyl Peroxide Salicylic Acid  Backache/Headache: Tylenol: 2 regular strength every 4 hours OR              2 Extra strength every 6 hours  Colds/Coughs/Allergies: Benadryl (alcohol free) 25 mg every 6 hours as needed Breath right  strips Claritin Cepacol throat lozenges Chloraseptic throat spray Cold-Eeze- up to three times per day Cough drops, alcohol free Flonase (by prescription only) Guaifenesin Mucinex Robitussin DM (plain only, alcohol free) Saline nasal spray/drops Sudafed (pseudoephedrine) & Actifed ** use only after [redacted] weeks gestation and if you do not have high blood pressure Tylenol Vicks Vaporub Zinc lozenges Zyrtec   Constipation: Colace Ducolax suppositories Fleet enema Glycerin suppositories Metamucil Milk of magnesia Miralax Senokot Smooth move tea  Diarrhea: Kaopectate Imodium A-D  *NO pepto Bismol  Hemorrhoids: Anusol Anusol HC Preparation H Tucks  Indigestion: Tums Maalox Mylanta Zantac  Pepcid  Insomnia: Benadryl (alcohol free) 25mg  every 6 hours as needed Tylenol PM Unisom, no Gelcaps  Leg Cramps: Tums MagGel  Nausea/Vomiting:  Bonine Dramamine Emetrol Ginger extract Sea bands Meclizine  Nausea medication to take during pregnancy:  Unisom (doxylamine succinate 25 mg tablets) Take one tablet daily at bedtime. If symptoms are not adequately controlled, the dose can be increased to a maximum recommended dose of two tablets daily (1/2 tablet in the morning, 1/2 tablet mid-afternoon and one at bedtime). Vitamin B6 100mg  tablets. Take one tablet twice a day (up to 200 mg per day).  Skin Rashes: Aveeno products Benadryl cream or 25mg  every 6 hours as needed Calamine Lotion 1% cortisone cream  Yeast infection: Gyne-lotrimin 7 Monistat 7   **If taking multiple medications, please check labels to avoid duplicating the same active ingredients **take medication as directed on the label ** Do not exceed 4000 mg of tylenol in 24 hours **Do not take medications that contain aspirin or ibuprofen

## 2016-11-13 ENCOUNTER — Encounter: Payer: Self-pay | Admitting: Obstetrics and Gynecology

## 2016-11-13 ENCOUNTER — Other Ambulatory Visit (HOSPITAL_COMMUNITY)
Admission: RE | Admit: 2016-11-13 | Discharge: 2016-11-13 | Disposition: A | Discharge status: Home / Self Care | Payer: Medicaid Other | Source: Ambulatory Visit | Attending: Obstetrics and Gynecology | Admitting: Obstetrics and Gynecology

## 2016-11-13 ENCOUNTER — Ambulatory Visit (INDEPENDENT_AMBULATORY_CARE_PROVIDER_SITE_OTHER): Payer: Medicaid Other | Admitting: Obstetrics and Gynecology

## 2016-11-13 ENCOUNTER — Encounter: Payer: Self-pay | Admitting: *Deleted

## 2016-11-13 DIAGNOSIS — O09211 Supervision of pregnancy with history of pre-term labor, first trimester: Secondary | ICD-10-CM

## 2016-11-13 DIAGNOSIS — Z3481 Encounter for supervision of other normal pregnancy, first trimester: Secondary | ICD-10-CM | POA: Diagnosis not present

## 2016-11-13 DIAGNOSIS — Z113 Encounter for screening for infections with a predominantly sexual mode of transmission: Secondary | ICD-10-CM | POA: Insufficient documentation

## 2016-11-13 DIAGNOSIS — O09219 Supervision of pregnancy with history of pre-term labor, unspecified trimester: Secondary | ICD-10-CM

## 2016-11-13 DIAGNOSIS — O0990 Supervision of high risk pregnancy, unspecified, unspecified trimester: Secondary | ICD-10-CM | POA: Insufficient documentation

## 2016-11-13 DIAGNOSIS — O34219 Maternal care for unspecified type scar from previous cesarean delivery: Secondary | ICD-10-CM

## 2016-11-13 DIAGNOSIS — A6009 Herpesviral infection of other urogenital tract: Secondary | ICD-10-CM | POA: Diagnosis not present

## 2016-11-13 DIAGNOSIS — O98311 Other infections with a predominantly sexual mode of transmission complicating pregnancy, first trimester: Secondary | ICD-10-CM

## 2016-11-13 DIAGNOSIS — G43909 Migraine, unspecified, not intractable, without status migrainosus: Secondary | ICD-10-CM | POA: Insufficient documentation

## 2016-11-13 DIAGNOSIS — G43C0 Periodic headache syndromes in child or adult, not intractable: Secondary | ICD-10-CM

## 2016-11-13 DIAGNOSIS — B009 Herpesviral infection, unspecified: Secondary | ICD-10-CM | POA: Insufficient documentation

## 2016-11-13 DIAGNOSIS — Z3480 Encounter for supervision of other normal pregnancy, unspecified trimester: Secondary | ICD-10-CM

## 2016-11-13 DIAGNOSIS — O09899 Supervision of other high risk pregnancies, unspecified trimester: Secondary | ICD-10-CM | POA: Insufficient documentation

## 2016-11-13 MED ORDER — DOXYLAMINE-PYRIDOXINE 10-10 MG PO TBEC: DELAYED_RELEASE_TABLET | ORAL | 6 refills | Status: DC

## 2016-11-13 NOTE — Progress Notes (Signed)
Subjective:    Connie Jenkins is a [id number] [redacted]w[redacted]d being seen today for her first obstetrical visit.  Her obstetrical history is significant for previous 34 weeks preterm delivery, 2 previous cesarean section. Patient does intend to breast feed. Pregnancy history fully reviewed.  Patient reports nausea and vomiting.  Vitals:   11/13/16 1328  BP: 119/74  Pulse: 91  Weight: 220 lb (99.8 kg)    HISTORY: OB History  Gravida Para Term Preterm AB Living  4 2 1 1 1 2   SAB TAB Ectopic Multiple Live Births  1       2    # Outcome Date GA Lbr Len/2nd Weight Sex Delivery Anes PTL Lv  4 Current           3 SAB 2013          2 Term 09/18/03    F CS-LTranv Spinal  LIV  1 Preterm 08/03/99    F CS-LTranv EPI  LIV     Past Medical History:  Diagnosis Date  . Anxiety    no meds  . Asthma    inhaler rarely used - twice year  . Depression    no current Tx.  . Febrile illness, acute 01/2011   Hospitalized for 6 days  . Headache   . Herpes 2008  . Hx of bronchitis 09/2012  . IBS (irritable bowel syndrome)   . Renal stone 08/26/2014  . Seasonal allergies    Past Surgical History:  Procedure Laterality Date  . CESAREAN SECTION  2000 & 2004   "1st one I dilated 1cm, then the 2nd one was a rpt  . CYSTOSCOPY WITH RETROGRADE PYELOGRAM, URETEROSCOPY AND STENT PLACEMENT Left 08/26/2014   Procedure: CYSTOSCOPY WITH RETROGRADE PYELOGRAM, URETEROSCOPY, LASER, STONE EXTRACTION WITH BASKET;  Surgeon: Raynelle Bring, MD;  Location: [facility];  Service: Urology;  Laterality: Left;  . HERNIA REPAIR    . HYSTEROSCOPY N/A 03/24/2013   Procedure: HYSTEROSCOPY / Dilitation and curettage/endometrial curettings;  Surgeon: Lavonia Drafts, MD;  Location: [facility];  Service: Gynecology;  Laterality: N/A;  Diagnostic hysteroscopy  . TMJ ARTHROPLASTY    . WISDOM TOOTH EXTRACTION     Family History  Problem Relation Age of Onset  . Hypertension Mother   . Diabetes Mother      Exam    Uterus:      Pelvic Exam:    Perineum: Normal Perineum   Vulva: normal   Vagina:  normal mucosa, normal discharge   pH:    Cervix: multiparous appearance and cervix is closed and long   Adnexa: normal adnexa and no mass, fullness, tenderness   Bony Pelvis: gynecoid  System: Breast:  normal appearance, no masses or tenderness   Skin: normal coloration and turgor, no rashes    Neurologic: oriented, no focal deficits   Extremities: normal strength, tone, and muscle mass   HEENT extra ocular movement intact   Mouth/Teeth mucous membranes moist, pharynx normal without lesions and dental hygiene good   Neck supple and no masses   Cardiovascular: regular rate and rhythm   Respiratory:  chest clear, no wheezing, crepitations, rhonchi, normal symmetric air entry   Abdomen: soft, non-tender; bowel sounds normal; no masses,  no organomegaly   Urinary:       Assessment:    Pregnancy: [id number] Patient Active Problem List   Diagnosis Date Noted  . Previous cesarean section complicating pregnancy, antepartum condition or complication [id number]  . Supervision of other normal pregnancy, antepartum 11/13/2016  .  History of preterm delivery, currently pregnant 11/13/2016  . Genital herpes complicating pregnancy in antepartum condition, first trimester 11/13/2016  . Migraines 11/13/2016  . 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 Prenatal vitamins. Problem list reviewed and updated. Genetic Screening discussed First Screen: ordered.  Ultrasound discussed; fetal survey: requested. Patient desires weekly 17-p injections starting at 16 weeks She understands that she will be scheduled for a repeat cesarean section given her history She declined flu vaccine She is undecided on contraception  Follow up in 4 weeks. 50% of 30 min visit spent on counseling and coordination of care.     Connie Jenkins 11/13/2016

## 2016-11-13 NOTE — Progress Notes (Signed)
Agrees to first trimester screen, pt requests rf for Valtrex.

## 2016-11-13 NOTE — Patient Instructions (Signed)
First Trimester of Pregnancy The first trimester of pregnancy is from week 1 until the end of week 12 (months 1 through 3). A week after a sperm fertilizes an egg, the egg will implant on the wall of the uterus. This embryo will begin to develop into a baby. Genes from you and your partner are forming the baby. The female genes determine whether the baby is a boy or a girl. At 6-8 weeks, the eyes and face are formed, and the heartbeat can be seen on ultrasound. At the end of 12 weeks, all the baby's organs are formed.  Now that you are pregnant, you will want to do everything you can to have a healthy baby. Two of the most important things are to get good prenatal care and to follow your health care provider's instructions. Prenatal care is all the medical care you receive before the baby's birth. This care will help prevent, find, and treat any problems during the pregnancy and childbirth. BODY CHANGES Your body goes through many changes during pregnancy. The changes vary from woman to woman.   You may gain or lose a couple of pounds at first.  You may feel sick to your stomach (nauseous) and throw up (vomit). If the vomiting is uncontrollable, call your health care provider.  You may tire easily.  You may develop headaches that can be relieved by medicines approved by your health care provider.  You may urinate more often. Painful urination may mean you have a bladder infection.  You may develop heartburn as a result of your pregnancy.  You may develop constipation because certain hormones are causing the muscles that push waste through your intestines to slow down.  You may develop hemorrhoids or swollen, bulging veins (varicose veins).  Your breasts may begin to grow larger and become tender. Your nipples may stick out more, and the tissue that surrounds them (areola) may become darker.  Your gums may bleed and may be sensitive to brushing and flossing.  Dark spots or blotches  (chloasma, mask of pregnancy) may develop on your face. This will likely fade after the baby is born.  Your menstrual periods will stop.  You may have a loss of appetite.  You may develop cravings for certain kinds of food.  You may have changes in your emotions from day to day, such as being excited to be pregnant or being concerned that something may go wrong with the pregnancy and baby.  You may have more vivid and strange dreams.  You may have changes in your hair. These can include thickening of your hair, rapid growth, and changes in texture. Some women also have hair loss during or after pregnancy, or hair that feels dry or thin. Your hair will most likely return to normal after your baby is born. WHAT TO EXPECT AT YOUR PRENATAL VISITS During a routine prenatal visit:  You will be weighed to make sure you and the baby are growing normally.  Your blood pressure will be taken.  Your abdomen will be measured to track your baby's growth.  The fetal heartbeat will be listened to starting around week 10 or 12 of your pregnancy.  Test results from any previous visits will be discussed. Your health care provider may ask you:  How you are feeling.  If you are feeling the baby move.  If you have had any abnormal symptoms, such as leaking fluid, bleeding, severe headaches, or abdominal cramping.  If you are using any tobacco  are feeling the baby move.  · If you have had any abnormal symptoms, such as leaking fluid, bleeding, severe headaches, or abdominal cramping.  · If you are using any tobacco products, including cigarettes, chewing tobacco, and electronic cigarettes.  · If you have any questions.  Other tests that may be performed during your first trimester include:  · Blood tests to find your blood type and to check for the presence of any previous infections. They will also be used to check for low iron levels (anemia) and Rh antibodies. Later in the pregnancy, blood tests for diabetes will be done along with other tests if problems develop.  · Urine tests to check for infections, diabetes, or protein in the urine.   · An ultrasound to confirm the proper growth and development of the baby.  · An amniocentesis to check for possible genetic problems.  · Fetal screens for spina bifida and Down syndrome.  · You may need other tests to make sure you and the baby are doing well.  · HIV (human immunodeficiency virus) testing. Routine prenatal testing includes screening for HIV, unless you choose not to have this test.  HOME CARE INSTRUCTIONS   Medicines  · Follow your health care provider's instructions regarding medicine use. Specific medicines may be either safe or unsafe to take during pregnancy.  · Take your prenatal vitamins as directed.  · If you develop constipation, try taking a stool softener if your health care provider approves.  Diet  · Eat regular, well-balanced meals. Choose a variety of foods, such as meat or vegetable-based protein, fish, milk and low-fat dairy products, vegetables, fruits, and whole grain breads and cereals. Your health care provider will help you determine the amount of weight gain that is right for you.  · Avoid raw meat and uncooked cheese. These carry germs that can cause birth defects in the baby.  · Eating four or five small meals rather than three large meals a day may help relieve nausea and vomiting. If you start to feel nauseous, eating a few soda crackers can be helpful. Drinking liquids between meals instead of during meals also seems to help nausea and vomiting.  · If you develop constipation, eat more high-fiber foods, such as fresh vegetables or fruit and whole grains. Drink enough fluids to keep your urine clear or pale yellow.  Activity and Exercise  · Exercise only as directed by your health care provider. Exercising will help you:    Control your weight.    Stay in shape.    Be prepared for labor and delivery.  · Experiencing pain or cramping in the lower abdomen or low back is a good sign that you should stop exercising. Check with your health care provider  before continuing normal exercises.  · Try to avoid standing for long periods of time. Move your legs often if you must stand in one place for a long time.  · Avoid heavy lifting.  · Wear low-heeled shoes, and practice good posture.  · You may continue to have sex unless your health care provider directs you otherwise.  Relief of Pain or Discomfort  · Wear a good support bra for breast tenderness.    · Take warm sitz baths to soothe any pain or discomfort caused by hemorrhoids. Use hemorrhoid cream if your health care provider approves.    · Rest with your legs elevated if you have leg cramps or low back pain.  · If you develop varicose veins in your   prenatal visits as directed by your health care provider. Safety   Wear your seat belt at all times when driving.  Make a list of emergency phone numbers, including numbers for family, friends, the hospital, and police and fire departments. General Tips   Ask your health care provider for a referral to a local prenatal education class. Begin classes no later than at the beginning of month 6 of your pregnancy.  Ask for help if you have counseling or nutritional needs during pregnancy. Your health care provider can offer advice or refer you to specialists for help with various needs.  Do not use hot tubs, steam rooms, or saunas.  Do not douche or use tampons or scented sanitary pads.  Do not cross your legs for long periods of time.  Avoid cat litter boxes and soil used by cats. These carry germs that can cause birth defects in the baby and possibly loss of the fetus by miscarriage or stillbirth.  Avoid all smoking, herbs, alcohol, and  medicines not prescribed by your health care provider. Chemicals in these affect the formation and growth of the baby.  Do not use any tobacco products, including cigarettes, chewing tobacco, and electronic cigarettes. If you need help quitting, ask your health care provider. You may receive counseling support and other resources to help you quit.  Schedule a dentist appointment. At home, brush your teeth with a soft toothbrush and be gentle when you floss. SEEK MEDICAL CARE IF:   You have dizziness.  You have mild pelvic cramps, pelvic pressure, or nagging pain in the abdominal area.  You have persistent nausea, vomiting, or diarrhea.  You have a bad smelling vaginal discharge.  You have pain with urination.  You notice increased swelling in your face, hands, legs, or ankles. SEEK IMMEDIATE MEDICAL CARE IF:   You have a fever.  You are leaking fluid from your vagina.  You have spotting or bleeding from your vagina.  You have severe abdominal cramping or pain.  You have rapid weight gain or loss.  You vomit blood or material that looks like coffee grounds.  You are exposed to [place] measles and have never had them.  You are exposed to fifth disease or chickenpox.  You develop a severe headache.  You have shortness of breath.  You have any kind of trauma, such as from a fall or a car accident. This information is not intended to replace advice given to you by your health care provider. Make sure you discuss any questions you have with your health care provider. Document Released: 11/07/2001 Document Revised: 12/04/2014 Document Reviewed: 09/23/2013 Elsevier Interactive Patient Education  2017 [employer].  Contraception Choices Contraception (birth control) is the use of any methods or devices to prevent pregnancy. Below are some methods to help avoid pregnancy. Hormonal methods  Contraceptive implant. This is a thin, plastic tube containing progesterone hormone. It  does not contain estrogen hormone. Your health care provider inserts the tube in the inner part of the upper arm. The tube can remain in place for up to 3 years. After 3 years, the implant must be removed. The implant prevents the ovaries from releasing an egg (ovulation), thickens the cervical mucus to prevent sperm from entering the uterus, and thins the lining of the inside of the uterus.  Progesterone-only injections. These injections are given every 3 months by your health care provider to prevent pregnancy. This synthetic progesterone hormone stops the ovaries from releasing eggs. It also thickens cervical  mucus and changes the uterine lining. This makes it harder for sperm to survive in the uterus.  Birth control pills. These pills contain estrogen and progesterone hormone. They work by preventing the ovaries from releasing eggs (ovulation). They also cause the cervical mucus to thicken, preventing the sperm from entering the uterus. Birth control pills are prescribed by a health care provider.Birth control pills can also be used to treat heavy periods.  Minipill. This type of birth control pill contains only the progesterone hormone. They are taken every day of each month and must be prescribed by your health care provider.  Birth control patch. The patch contains hormones similar to those in birth control pills. It must be changed once a week and is prescribed by a health care provider.  Vaginal ring. The ring contains hormones similar to those in birth control pills. It is left in the vagina for 3 weeks, removed for 1 week, and then a new one is put back in place. The patient must be comfortable inserting and removing the ring from the vagina.A health care provider's prescription is necessary.  Emergency contraception. Emergency contraceptives prevent pregnancy after unprotected sexual intercourse. This pill can be taken right after sex or up to 5 days after unprotected sex. It is most  effective the sooner you take the pills after having sexual intercourse. Most emergency contraceptive pills are available without a prescription. Check with your pharmacist. Do not use emergency contraception as your only form of birth control. Barrier methods  Female condom. This is a thin sheath (latex or rubber) that is worn over the penis during sexual intercourse. It can be used with spermicide to increase effectiveness.  Female condom. This is a soft, loose-fitting sheath that is put into the vagina before sexual intercourse.  Diaphragm. This is a soft, latex, dome-shaped barrier that must be fitted by a health care provider. It is inserted into the vagina, along with a spermicidal jelly. It is inserted before intercourse. The diaphragm should be left in the vagina for 6 to 8 hours after intercourse.  Cervical cap. This is a round, soft, latex or plastic cup that fits over the cervix and must be fitted by a health care provider. The cap can be left in place for up to 48 hours after intercourse.  Sponge. This is a soft, circular piece of polyurethane foam. The sponge has spermicide in it. It is inserted into the vagina after wetting it and before sexual intercourse.  Spermicides. These are chemicals that kill or block sperm from entering the cervix and uterus. They come in the form of creams, jellies, suppositories, foam, or tablets. They do not require a prescription. They are inserted into the vagina with an applicator before having sexual intercourse. The process must be repeated every time you have sexual intercourse. Intrauterine contraception  Intrauterine device (IUD). This is a T-shaped device that is put in a woman's uterus during a menstrual period to prevent pregnancy. There are 2 types:  Copper IUD. This type of IUD is wrapped in copper wire and is placed inside the uterus. Copper makes the uterus and fallopian tubes produce a fluid that kills sperm. It can stay in place for 10  years.  Hormone IUD. This type of IUD contains the hormone progestin (synthetic progesterone). The hormone thickens the cervical mucus and prevents sperm from entering the uterus, and it also thins the uterine lining to prevent implantation of a fertilized egg. The hormone can weaken or kill the sperm that  get into the uterus. It can stay in place for 3-5 years, depending on which type of IUD is used. Permanent methods of contraception  Female tubal ligation. This is when the woman's fallopian tubes are surgically sealed, tied, or blocked to prevent the egg from traveling to the uterus.  Hysteroscopic sterilization. This involves placing a small coil or insert into each fallopian tube. Your doctor uses a technique called hysteroscopy to do the procedure. The device causes scar tissue to form. This results in permanent blockage of the fallopian tubes, so the sperm cannot fertilize the egg. It takes about 3 months after the procedure for the tubes to become blocked. You must use another form of birth control for these 3 months.  Female sterilization. This is when the female has the tubes that carry sperm tied off (vasectomy).This blocks sperm from entering the vagina during sexual intercourse. After the procedure, the man can still ejaculate fluid (semen). Natural planning methods  Natural family planning. This is not having sexual intercourse or using a barrier method (condom, diaphragm, cervical cap) on days the woman could become pregnant.  Calendar method. This is keeping track of the length of each menstrual cycle and identifying when you are fertile.  Ovulation method. This is avoiding sexual intercourse during ovulation.  Symptothermal method. This is avoiding sexual intercourse during ovulation, using a thermometer and ovulation symptoms.  Post-ovulation method. This is timing sexual intercourse after you have ovulated. Regardless of which type or method of contraception you choose, it is  important that you use condoms to protect against the transmission of sexually transmitted infections (STIs). Talk with your health care provider about which form of contraception is most appropriate for you. This information is not intended to replace advice given to you by your health care provider. Make sure you discuss any questions you have with your health care provider. Document Released: 11/13/2005 Document Revised: 04/20/2016 Document Reviewed: 05/08/2013 Elsevier Interactive Patient Education  2017 [employer].   Breastfeeding Deciding to breastfeed is one of the best choices you can make for you and your baby. A change in hormones during pregnancy causes your breast tissue to grow and increases the number and size of your milk ducts. These hormones also allow proteins, sugars, and fats from your blood supply to make breast milk in your milk-producing glands. Hormones prevent breast milk from being released before your baby is born as well as prompt milk flow after birth. Once breastfeeding has begun, thoughts of your baby, as well as his or her sucking or crying, can stimulate the release of milk from your milk-producing glands. Benefits of breastfeeding For Your Baby  Your first milk (colostrum) helps your baby's digestive system function better.  There are antibodies in your milk that help your baby fight off infections.  Your baby has a lower incidence of asthma, allergies, and sudden infant death syndrome.  The nutrients in breast milk are better for your baby than infant formulas and are designed uniquely for your baby's needs.  Breast milk improves your baby's brain development.  Your baby is less likely to develop other conditions, such as childhood obesity, asthma, or type 2 diabetes mellitus. For You  Breastfeeding helps to create a very special bond between you and your baby.  Breastfeeding is convenient. Breast milk is always available at the correct temperature and  costs nothing.  Breastfeeding helps to burn calories and helps you lose the weight gained during pregnancy.  Breastfeeding makes your uterus contract to its  prepregnancy size faster and slows bleeding (lochia) after you give birth.  Breastfeeding helps to lower your risk of developing type 2 diabetes mellitus, osteoporosis, and breast or ovarian cancer later in life. Signs that your baby is hungry Early Signs of Hunger  Increased alertness or activity.  Stretching.  Movement of the head from side to side.  Movement of the head and opening of the mouth when the corner of the mouth or cheek is stroked (rooting).  Increased sucking sounds, smacking lips, cooing, sighing, or squeaking.  Hand-to-mouth movements.  Increased sucking of fingers or hands. Late Signs of Hunger  Fussing.  Intermittent crying. Extreme Signs of Hunger  Signs of extreme hunger will require calming and consoling before your baby will be able to breastfeed successfully. Do not wait for the following signs of extreme hunger to occur before you initiate breastfeeding:  Restlessness.  A loud, strong cry.  Screaming. Breastfeeding basics  Breastfeeding Initiation  Find a comfortable place to sit or lie down, with your neck and back well supported.  Place a pillow or rolled up blanket under your baby to bring him or her to the level of your breast (if you are seated). Nursing pillows are specially designed to help support your arms and your baby while you breastfeed.  Make sure that your baby's abdomen is facing your abdomen.  Gently massage your breast. With your fingertips, massage from your chest wall toward your nipple in a circular motion. This encourages milk flow. You may need to continue this action during the feeding if your milk flows slowly.  Support your breast with 4 fingers underneath and your thumb above your nipple. Make sure your fingers are well away from your nipple and your baby's  mouth.  Stroke your baby's lips gently with your finger or nipple.  When your baby's mouth is open wide enough, quickly bring your baby to your breast, placing your entire nipple and as much of the colored area around your nipple (areola) as possible into your baby's mouth.  More areola should be visible above your baby's upper lip than below the lower lip.  Your baby's tongue should be between his or her lower gum and your breast.  Ensure that your baby's mouth is correctly positioned around your nipple (latched). Your baby's lips should create a seal on your breast and be turned out (everted).  It is common for your baby to suck about 2-3 minutes in order to start the flow of breast milk. Latching  Teaching your baby how to latch on to your breast properly is very important. An improper latch can cause nipple pain and decreased milk supply for you and poor weight gain in your baby. Also, if your baby is not latched onto your nipple properly, he or she may swallow some air during feeding. This can make your baby fussy. Burping your baby when you switch breasts during the feeding can help to get rid of the air. However, teaching your baby to latch on properly is still the best way to prevent fussiness from swallowing air while breastfeeding. Signs that your baby has successfully latched on to your nipple:  Silent tugging or silent sucking, without causing you pain.  Swallowing heard between every 3-4 sucks.  Muscle movement above and in front of his or her ears while sucking. Signs that your baby has not successfully latched on to nipple:  Sucking sounds or smacking sounds from your baby while breastfeeding.  Nipple pain. If you think your  baby has not latched on correctly, slip your finger into the corner of your baby's mouth to break the suction and place it between your baby's gums. Attempt breastfeeding initiation again. Signs of Successful Breastfeeding  Signs from your baby:  A  gradual decrease in the number of sucks or complete cessation of sucking.  Falling asleep.  Relaxation of his or her body.  Retention of a small amount of milk in his or her mouth.  Letting go of your breast by himself or herself. Signs from you:  Breasts that have increased in firmness, weight, and size 1-3 hours after feeding.  Breasts that are softer immediately after breastfeeding.  Increased milk volume, as well as a change in milk consistency and color by the fifth day of breastfeeding.  Nipples that are not sore, cracked, or bleeding. Signs That Your [employer] is Getting Enough Milk  Wetting at least 1-2 diapers during the first 24 hours after birth.  Wetting at least 5-6 diapers every 24 hours for the first week after birth. The urine should be clear or pale yellow by 5 days after birth.  Wetting 6-8 diapers every 24 hours as your baby continues to grow and develop.  At least 3 stools in a 24-hour period by age 85 days. The stool should be soft and yellow.  At least 3 stools in a 24-hour period by age 32 days. The stool should be seedy and yellow.  No loss of weight greater than 10% of birth weight during the first 56 days of age.  Average weight gain of 4-7 ounces (113-198 g) per week after age 98 days.  Consistent daily weight gain by age 21 days, without weight loss after the age of 2 weeks. After a feeding, your baby may spit up a small amount. This is common. Breastfeeding frequency and duration Frequent feeding will help you make more milk and can prevent sore nipples and breast engorgement. Breastfeed when you feel the need to reduce the fullness of your breasts or when your baby shows signs of hunger. This is called "breastfeeding on demand." Avoid introducing a pacifier to your baby while you are working to establish breastfeeding (the first 4-6 weeks after your baby is born). After this time you may choose to use a pacifier. Research has shown that pacifier use during the  first year of a baby's life decreases the risk of sudden infant death syndrome (SIDS). Allow your baby to feed on each breast as long as he or she wants. Breastfeed until your baby is finished feeding. When your baby unlatches or falls asleep while feeding from the first breast, offer the second breast. Because newborns are often sleepy in the first few weeks of life, you may need to awaken your baby to get him or her to feed. Breastfeeding times will vary from baby to baby. However, the following rules can serve as a guide to help you ensure that your baby is properly fed:  Newborns (babies 60 weeks of age or younger) may breastfeed every 1-3 hours.  Newborns should not go longer than 3 hours during the day or 5 hours during the night without breastfeeding.  You should breastfeed your baby a minimum of 8 times in a 24-hour period until you begin to introduce solid foods to your baby at around 70 months of age. Breast milk pumping Pumping and storing breast milk allows you to ensure that your baby is exclusively fed your breast milk, even at times when you are unable  to breastfeed. This is especially important if you are going back to work while you are still breastfeeding or when you are not able to be present during feedings. Your lactation consultant can give you guidelines on how long it is safe to store breast milk. A breast pump is a machine that allows you to pump milk from your breast into a sterile bottle. The pumped breast milk can then be stored in a refrigerator or freezer. Some breast pumps are operated by hand, while others use electricity. Ask your lactation consultant which type will work best for you. Breast pumps can be purchased, but some hospitals and breastfeeding support groups lease breast pumps on a monthly basis. A lactation consultant can teach you how to hand express breast milk, if you prefer not to use a pump. Caring for your breasts while you breastfeed Nipples can become  dry, cracked, and sore while breastfeeding. The following recommendations can help keep your breasts moisturized and healthy:  Avoid using soap on your nipples.  Wear a supportive bra. Although not required, special nursing bras and tank tops are designed to allow access to your breasts for breastfeeding without taking off your entire bra or top. Avoid wearing underwire-style bras or extremely tight bras.  Air dry your nipples for 3-61minutes after each feeding.  Use only cotton bra pads to absorb leaked breast milk. Leaking of breast milk between feedings is normal.  Use lanolin on your nipples after breastfeeding. Lanolin helps to maintain your skin's normal moisture barrier. If you use pure lanolin, you do not need to wash it off before feeding your baby again. Pure lanolin is not toxic to your baby. You may also hand express a few drops of breast milk and gently massage that milk into your nipples and allow the milk to air dry. In the first few weeks after giving birth, some women experience extremely full breasts (engorgement). Engorgement can make your breasts feel heavy, warm, and tender to the touch. Engorgement peaks within 3-5 days after you give birth. The following recommendations can help ease engorgement:  Completely empty your breasts while breastfeeding or pumping. You may want to start by applying warm, moist heat (in the shower or with warm water-soaked hand towels) just before feeding or pumping. This increases circulation and helps the milk flow. If your baby does not completely empty your breasts while breastfeeding, pump any extra milk after he or she is finished.  Wear a snug bra (nursing or regular) or tank top for 1-2 days to signal your body to slightly decrease milk production.  Apply ice packs to your breasts, unless this is too uncomfortable for you.  Make sure that your baby is latched on and positioned properly while breastfeeding. If engorgement persists after 48  hours of following these recommendations, contact your health care provider or a Science writer. Overall health care recommendations while breastfeeding  Eat healthy foods. Alternate between meals and snacks, eating 3 of each per day. Because what you eat affects your breast milk, some of the foods may make your baby more irritable than usual. Avoid eating these foods if you are sure that they are negatively affecting your baby.  Drink milk, fruit juice, and water to satisfy your thirst (about 10 glasses a day).  Rest often, relax, and continue to take your prenatal vitamins to prevent fatigue, stress, and anemia.  Continue breast self-awareness checks.  Avoid chewing and smoking tobacco. Chemicals from cigarettes that pass into breast milk and exposure to  secondhand smoke may harm your baby.  Avoid alcohol and drug use, including marijuana. Some medicines that may be harmful to your baby can pass through breast milk. It is important to ask your health care provider before taking any medicine, including all over-the-counter and prescription medicine as well as vitamin and herbal supplements. It is possible to become pregnant while breastfeeding. If birth control is desired, ask your health care provider about options that will be safe for your baby. Contact a health care provider if:  You feel like you want to stop breastfeeding or have become frustrated with breastfeeding.  You have painful breasts or nipples.  Your nipples are cracked or bleeding.  Your breasts are red, tender, or warm.  You have a swollen area on either breast.  You have a fever or chills.  You have nausea or vomiting.  You have drainage other than breast milk from your nipples.  Your breasts do not become full before feedings by the fifth day after you give birth.  You feel sad and depressed.  Your baby is too sleepy to eat well.  Your baby is having trouble sleeping.  Your baby is wetting less  than 3 diapers in a 24-hour period.  Your baby has less than 3 stools in a 24-hour period.  Your baby's skin or the white part of his or her eyes becomes yellow.  Your baby is not gaining weight by 26 days of age. Get help right away if:  Your baby is overly tired (lethargic) and does not want to wake up and feed.  Your baby develops an unexplained fever. This information is not intended to replace advice given to you by your health care provider. Make sure you discuss any questions you have with your health care provider. Document Released: 11/13/2005 Document Revised: 04/26/2016 Document Reviewed: 05/07/2013 Elsevier Interactive Patient Education  2017 [employer].

## 2016-11-14 LAB — GC/CHLAMYDIA PROBE AMP (~~LOC~~) NOT AT ARMC
Chlamydia: NEGATIVE
Neisseria Gonorrhea: NEGATIVE

## 2016-11-14 MED ORDER — HYDROXYPROGESTERONE CAPROATE 250 MG/ML IM OIL
250.0000 mg | TOPICAL_OIL | INTRAMUSCULAR | Status: DC
  Administered 2017-01-09 – 2017-01-24 (×2): 250 mg via INTRAMUSCULAR

## 2016-11-14 NOTE — Addendum Note (Signed)
Addended by: Maryruth Eve on: 11/14/2016 08:50 AM   Modules accepted: Orders

## 2016-11-15 LAB — OBSTETRIC PANEL, INCLUDING HIV
Antibody Screen: NEGATIVE
BASOS ABS: 0 10*3/uL (ref 0.0–0.2)
Basos: 0 %
EOS (ABSOLUTE): 0.1 10*3/uL (ref 0.0–0.4)
Eos: 1 %
HIV Screen 4th Generation wRfx: NONREACTIVE
Hematocrit: 37 % (ref 34.0–46.6)
Hemoglobin: 12.5 g/dL (ref 11.1–15.9)
Hepatitis B Surface Ag: NEGATIVE
IMMATURE GRANS (ABS): 0 10*3/uL (ref 0.0–0.1)
IMMATURE GRANULOCYTES: 0 %
LYMPHS: 19 %
Lymphocytes Absolute: 1.9 10*3/uL (ref 0.7–3.1)
MCH: 27.7 pg (ref 26.6–33.0)
MCHC: 33.8 g/dL (ref 31.5–35.7)
MCV: 82 fL (ref 79–97)
MONOCYTES: 5 %
MONOS ABS: 0.5 10*3/uL (ref 0.1–0.9)
NEUTROS PCT: 75 %
Neutrophils Absolute: 7.5 10*3/uL — ABNORMAL HIGH (ref 1.4–7.0)
Platelets: 301 10*3/uL (ref 150–379)
RBC: 4.52 x10E6/uL (ref 3.77–5.28)
RDW: 13.8 % (ref 12.3–15.4)
RPR Ser Ql: NONREACTIVE
RUBELLA: 1.9 {index} (ref >0.99)
Rh Factor: POSITIVE
WBC: 10 10*3/uL (ref 3.4–10.8)

## 2016-11-15 LAB — HEMOGLOBINOPATHY EVALUATION
HEMOGLOBIN A2 QUANTITATION: 2.3 % (ref 1.8–3.2)
HEMOGLOBIN F QUANTITATION: 0 % (ref 0.0–2.0)
HGB C: 0 %
HGB S: 0 %
HGB VARIANT: 0 %
Hgb A: 97.7 % (ref 96.4–98.8)

## 2016-11-15 LAB — VARICELLA ZOSTER ANTIBODY, IGG: VARICELLA: 1916 {index} (ref >165)

## 2016-11-15 LAB — URINE CULTURE, OB REFLEX

## 2016-11-15 LAB — CULTURE, OB URINE

## 2016-11-20 LAB — TOXASSURE SELECT 13 (MW), URINE

## 2016-11-22 ENCOUNTER — Encounter (HOSPITAL_COMMUNITY): Payer: Self-pay

## 2016-11-22 ENCOUNTER — Inpatient Hospital Stay (HOSPITAL_COMMUNITY)
Admission: AD | Admit: 2016-11-22 | Discharge: 2016-11-22 | Disposition: A | Discharge status: Home / Self Care | Payer: Medicaid Other | Source: Ambulatory Visit | Attending: Obstetrics and Gynecology | Admitting: Obstetrics and Gynecology

## 2016-11-22 DIAGNOSIS — Z833 Family history of diabetes mellitus: Secondary | ICD-10-CM | POA: Insufficient documentation

## 2016-11-22 DIAGNOSIS — Z880 Allergy status to penicillin: Secondary | ICD-10-CM | POA: Insufficient documentation

## 2016-11-22 DIAGNOSIS — Z91040 Latex allergy status: Secondary | ICD-10-CM | POA: Insufficient documentation

## 2016-11-22 DIAGNOSIS — O34219 Maternal care for unspecified type scar from previous cesarean delivery: Secondary | ICD-10-CM

## 2016-11-22 DIAGNOSIS — Z881 Allergy status to other antibiotic agents status: Secondary | ICD-10-CM | POA: Diagnosis not present

## 2016-11-22 DIAGNOSIS — O09899 Supervision of other high risk pregnancies, unspecified trimester: Secondary | ICD-10-CM

## 2016-11-22 DIAGNOSIS — Z3A09 9 weeks gestation of pregnancy: Secondary | ICD-10-CM | POA: Diagnosis not present

## 2016-11-22 DIAGNOSIS — Z8249 Family history of ischemic heart disease and other diseases of the circulatory system: Secondary | ICD-10-CM | POA: Diagnosis not present

## 2016-11-22 DIAGNOSIS — R1030 Lower abdominal pain, unspecified: Secondary | ICD-10-CM | POA: Diagnosis present

## 2016-11-22 DIAGNOSIS — O200 Threatened abortion: Secondary | ICD-10-CM

## 2016-11-22 DIAGNOSIS — O09219 Supervision of pregnancy with history of pre-term labor, unspecified trimester: Secondary | ICD-10-CM

## 2016-11-22 DIAGNOSIS — Z3480 Encounter for supervision of other normal pregnancy, unspecified trimester: Secondary | ICD-10-CM

## 2016-11-22 LAB — WET PREP, GENITAL
Clue Cells Wet Prep HPF POC: NONE SEEN
Sperm: NONE SEEN
Trich, Wet Prep: NONE SEEN
YEAST WET PREP: NONE SEEN

## 2016-11-22 LAB — URINALYSIS, ROUTINE W REFLEX MICROSCOPIC
Bilirubin Urine: NEGATIVE
Glucose, UA: NEGATIVE mg/dL
Ketones, ur: NEGATIVE mg/dL
Leukocytes, UA: NEGATIVE
NITRITE: NEGATIVE
PROTEIN: 30 mg/dL — AB
Specific Gravity, Urine: 1.026 (ref 1.005–1.030)
pH: 5 (ref 5.0–8.0)

## 2016-11-22 MED ORDER — GLYCOPYRROLATE 1 MG PO TABS: 1.0000 mg | ORAL_TABLET | Freq: Three times a day (TID) | ORAL | 3 refills | Status: DC

## 2016-11-22 NOTE — Discharge Instructions (Signed)
Threatened Miscarriage °A threatened miscarriage occurs when you have vaginal bleeding during your first 20 weeks of pregnancy but the pregnancy has not ended. If you have vaginal bleeding during this time, your health care provider will do tests to make sure you are still pregnant. If the tests show you are still pregnant and the developing baby (fetus) inside your womb (uterus) is still growing, your condition is considered a threatened miscarriage. °A threatened miscarriage does not mean your pregnancy will end, but it does increase the risk of losing your pregnancy (complete miscarriage). °What are the causes? °The cause of a threatened miscarriage is usually not known. If you go on to have a complete miscarriage, the most common cause is an abnormal number of chromosomes in the developing baby. Chromosomes are the structures inside cells that hold all your genetic material. °Some causes of vaginal bleeding that do not result in miscarriage include: °· Having sex. °· Having an infection. °· Normal hormone changes of pregnancy. °· Bleeding that occurs when an egg implants in your uterus. °What increases the risk? °Risk factors for bleeding in early pregnancy include: °· Obesity. °· Smoking. °· Drinking excessive amounts of alcohol or caffeine. °· Recreational drug use. °What are the signs or symptoms? °· Light vaginal bleeding. °· Mild abdominal pain or cramps. °How is this diagnosed? °If you have bleeding with or without abdominal pain before 20 weeks of pregnancy, your health care provider will do tests to check whether you are still pregnant. One important test involves using sound waves and a computer (ultrasound) to create images of the inside of your uterus. Other tests include an internal exam of your vagina and uterus (pelvic exam) and measurement of your baby’s heart rate. °You may be diagnosed with a threatened miscarriage if: °· Ultrasound testing shows you are still pregnant. °· Your baby’s heart rate  is strong. °· A pelvic exam shows that the opening between your uterus and your vagina (cervix) is closed. °· Your heart rate and blood pressure are stable. °· Blood tests confirm you are still pregnant. °How is this treated? °No treatments have been shown to prevent a threatened miscarriage from going on to a complete miscarriage. However, the right home care is important. °Follow these instructions at home: °· Make sure you keep all your appointments for prenatal care. This is very important. °· Get plenty of rest. °· Do not have sex or use tampons if you have vaginal bleeding. °· Do not douche. °· Do not smoke or use recreational drugs. °· Do not drink alcohol. °· Avoid caffeine. °Contact a health care provider if: °· You have light vaginal bleeding or spotting while pregnant. °· You have abdominal pain or cramping. °· You have a fever. °Get help right away if: °· You have heavy vaginal bleeding. °· You have blood clots coming from your vagina. °· You have severe low back pain or abdominal cramps. °· You have fever, chills, and severe abdominal pain. °This information is not intended to replace advice given to you by your health care provider. Make sure you discuss any questions you have with your health care provider. °Document Released: 11/13/2005 Document Revised: 04/20/2016 Document Reviewed: 09/09/2013 °Elsevier Interactive Patient Education © 2017 [employer] Inc. ° °

## 2016-11-22 NOTE — MAU Provider Note (Signed)
History     CSN: [id number]  Arrival date and time: 11/22/16 [id number]   First Provider Initiated Contact with Patient 11/22/16 2009      No chief complaint on file.  HPI 35 yo [id number] at [redacted]w[redacted]d presenting for the evaluation of lower abdominal pain. Patient reports onset of cramping pain 3 hours ago which is radiating to her rectum. She noted some bright red blood when she wiped in the MAU. She did not pay attention to see if bleeding was present prior to her arrival. She reports sexual intercourse 2 days ago. She continues to experience ptyalism.   Past Medical History:  Diagnosis Date  . Anxiety    no meds  . Asthma    inhaler rarely used - twice year  . Depression    no current Tx.  . Febrile illness, acute 01/2011   Hospitalized for 6 days  . Headache   . Herpes 2008  . Hx of bronchitis 09/2012  . IBS (irritable bowel syndrome)   . Renal stone 08/26/2014  . Seasonal allergies     Past Surgical History:  Procedure Laterality Date  . CESAREAN SECTION  2000 & 2004   "1st one I dilated 1cm, then the 2nd one was a rpt  . CYSTOSCOPY WITH RETROGRADE PYELOGRAM, URETEROSCOPY AND STENT PLACEMENT Left 08/26/2014   Procedure: CYSTOSCOPY WITH RETROGRADE PYELOGRAM, URETEROSCOPY, LASER, STONE EXTRACTION WITH BASKET;  Surgeon: Raynelle Bring, MD;  Location: [facility];  Service: Urology;  Laterality: Left;  . HERNIA REPAIR    . HYSTEROSCOPY N/A 03/24/2013   Procedure: HYSTEROSCOPY / Dilitation and curettage/endometrial curettings;  Surgeon: Lavonia Drafts, MD;  Location: [facility];  Service: Gynecology;  Laterality: N/A;  Diagnostic hysteroscopy  . TMJ ARTHROPLASTY    . WISDOM TOOTH EXTRACTION      Family History  Problem Relation Age of Onset  . Hypertension Mother   . Diabetes Mother     Social History  Substance Use Topics  . Smoking status: Never Smoker  . Smokeless tobacco: Never Used  . Alcohol use No     Comment: before +UPT     Allergies:  Allergies  Allergen  Reactions  . Flagyl [Metronidazole] Nausea And Vomiting  . Latex Itching  . Penicillins Hives and Other (See Comments)    Childhood allergy    Facility-Administered Medications Prior to Admission  Medication Dose Route Frequency Provider Last Rate Last Dose  . [START ON 01/05/2017] hydroxyprogesterone caproate (MAKENA) 250 mg/mL injection 250 mg  250 mg Intramuscular Weekly Dezaray Shibuya, MD       Prescriptions Prior to Admission  Medication Sig Dispense Refill Last Dose  . Doxylamine-Pyridoxine 10-10 MG TBEC Take 2 tabs at bedtime. If symptoms persist after 2 days, take 1 tab in the morning and 2 tabs at bedtime 100 tablet 6   . SUMAtriptan (IMITREX) 100 MG tablet Take by mouth.   Not Taking  . valACYclovir (VALTREX) 500 MG tablet Take by mouth.   Not Taking    ROS  See pertinent in HPI Physical Exam   Blood pressure 119/66, pulse 79, temperature 98.6 F (37 C), temperature source Oral, resp. rate 16, height 5\' 3"  (1.6 m), weight 224 lb (101.6 kg), last menstrual period 09/15/2016, SpO2 100 %.  Physical Exam GENERAL: Well-developed, well-nourished female in no acute distress.  ABDOMEN: Soft, nontender, nondistended.  PELVIC: Normal external female genitalia. Vagina is pink and rugated.  Normal discharge. Small amount of bright red blood in vault.  Normal appearing cervix. Cervix  is closed and long. Uterus is normal in size. No adnexal mass or tenderness. EXTREMITIES: No cyanosis, clubbing, or edema, 2+ distal pulses.  MAU Course  Procedures  MDM Bedside ultrasound demonstrates a viable IUP of 9+ weeks with positive heart beat and movement  Wet prep- negative  Assessment and Plan  35 yo with threatened abortion - Precautions and reasons to return to MAU reviewed - advised pelvic rest - Rx robinul provided - Follow up for ultrasound and ROB as scheduled  Ellington Cornia 11/22/2016, 8:20 PM

## 2016-11-22 NOTE — MAU Note (Signed)
Pt reports for the last 3 hours she has been having sharp lower abd pain that radiates to her rectum. Also reports vaginal odor but doesn't know if she is having discharge.

## 2016-11-23 ENCOUNTER — Telehealth: Payer: Self-pay | Admitting: *Deleted

## 2016-11-23 NOTE — Telephone Encounter (Signed)
Call placed to pharmacy making them aware that Diclegis was approved as of 11/14/16.   Pharmacy to contact pt when ready for pick up.

## 2016-12-01 ENCOUNTER — Encounter (HOSPITAL_COMMUNITY): Payer: Self-pay | Admitting: Obstetrics and Gynecology

## 2016-12-12 ENCOUNTER — Other Ambulatory Visit: Payer: Self-pay | Admitting: Certified Nurse Midwife

## 2016-12-12 ENCOUNTER — Ambulatory Visit (INDEPENDENT_AMBULATORY_CARE_PROVIDER_SITE_OTHER): Payer: Medicaid Other | Admitting: Certified Nurse Midwife

## 2016-12-12 VITALS — BP 124/67 | HR 102 | Temp 98.7°F | Wt 219.6 lb

## 2016-12-12 DIAGNOSIS — J069 Acute upper respiratory infection, unspecified: Secondary | ICD-10-CM

## 2016-12-12 DIAGNOSIS — O0990 Supervision of high risk pregnancy, unspecified, unspecified trimester: Secondary | ICD-10-CM

## 2016-12-12 DIAGNOSIS — O0992 Supervision of high risk pregnancy, unspecified, second trimester: Secondary | ICD-10-CM

## 2016-12-12 DIAGNOSIS — B9789 Other viral agents as the cause of diseases classified elsewhere: Secondary | ICD-10-CM

## 2016-12-12 LAB — INFLUENZA A AND B
Influenza A Ag, EIA: NEGATIVE
Influenza B Ag, EIA: NEGATIVE

## 2016-12-12 LAB — PLEASE NOTE:

## 2016-12-12 NOTE — Progress Notes (Signed)
   PRENATAL VISIT NOTE  Subjective:  Connie Jenkins is a 36 y.o. [phone] at [redacted]w[redacted]d being seen today for ongoing prenatal care.  She is currently monitored for the following issues for this high-risk pregnancy and has CONDYLOMA ACUMINATUM; Previous cesarean section complicating pregnancy, antepartum condition or complication; Supervision of high risk pregnancy, antepartum; History of preterm delivery, currently pregnant; Genital herpes complicating pregnancy in antepartum condition, first trimester; and Migraines on her problem list.  Patient reports no bleeding, no contractions, no cramping, no leaking and uri with ptylatism.  Contractions: Not present. Vag. Bleeding: Scant.  Movement: Present. Denies leaking of fluid.   The following portions of the patient's history were reviewed and updated as appropriate: allergies, current medications, past family history, past medical history, past social history, past surgical history and problem list. Problem list updated.  Objective:   Vitals:   12/12/16 0828  BP: 124/67  Pulse: (!) 102  Temp: 98.7 F (37.1 C)  Weight: 219 lb 9.6 oz (99.6 kg)    Fetal Status: Fetal Heart Rate (bpm): 164 Fundal Height: 12 cm Movement: Present     General:  Alert, oriented and cooperative. Patient is in no acute distress.  Skin: Skin is warm and dry. No rash noted.   Cardiovascular: Normal heart rate noted  Respiratory: Normal respiratory effort, no problems with respiration noted  Abdomen: Soft, gravid, appropriate for gestational age. Pain/Pressure: Absent     Pelvic:  Cervical exam deferred        Extremities: Normal range of motion.  Edema: None  Mental Status: Normal mood and affect. Normal behavior. Normal judgment and thought content.   Assessment and Plan:  Pregnancy: [id number] at [redacted]w[redacted]d  1. Supervision of high risk pregnancy, antepartum     17-P ordered for start at 17 weeks, discussed with patient     Has Transvaginal US ordered for NT testing  12/13/16     Ptylalitism: states that Robinul does not help.      Acute URI.  - [place] MFM OB DETAIL +14 WK; Future  Preterm labor symptoms and general obstetric precautions including but not limited to vaginal bleeding, contractions, leaking of fluid and fetal movement were reviewed in detail with the patient. Please refer to After Visit Summary for other counseling recommendations.  Return in about 4 weeks (around 01/09/2017) for ROB.   Morene Crocker, CNM

## 2016-12-12 NOTE — Progress Notes (Signed)
Patient c/o excess phlem in her throat and hot and cold flashes for the past three weeks.

## 2016-12-12 NOTE — Addendum Note (Signed)
Addended by: Manuela Schwartz C on: 12/12/2016 11:46 AM   Modules accepted: Orders

## 2016-12-13 ENCOUNTER — Ambulatory Visit (HOSPITAL_COMMUNITY)
Admission: RE | Admit: 2016-12-13 | Discharge: 2016-12-13 | Disposition: A | Discharge status: Home / Self Care | Payer: Medicaid Other | Source: Ambulatory Visit | Attending: Certified Nurse Midwife | Admitting: Certified Nurse Midwife

## 2016-12-13 ENCOUNTER — Ambulatory Visit (HOSPITAL_COMMUNITY)
Admission: RE | Admit: 2016-12-13 | Discharge: 2016-12-13 | Disposition: A | Discharge status: Home / Self Care | Payer: Medicaid Other | Source: Ambulatory Visit | Attending: Obstetrics and Gynecology | Admitting: Obstetrics and Gynecology

## 2016-12-13 ENCOUNTER — Other Ambulatory Visit: Payer: Self-pay | Admitting: Obstetrics and Gynecology

## 2016-12-13 ENCOUNTER — Encounter (HOSPITAL_COMMUNITY): Payer: Self-pay

## 2016-12-13 DIAGNOSIS — O99211 Obesity complicating pregnancy, first trimester: Secondary | ICD-10-CM | POA: Diagnosis not present

## 2016-12-13 DIAGNOSIS — O09211 Supervision of pregnancy with history of pre-term labor, first trimester: Secondary | ICD-10-CM | POA: Insufficient documentation

## 2016-12-13 DIAGNOSIS — Z3480 Encounter for supervision of other normal pregnancy, unspecified trimester: Secondary | ICD-10-CM

## 2016-12-13 DIAGNOSIS — Z3682 Encounter for antenatal screening for nuchal translucency: Secondary | ICD-10-CM | POA: Insufficient documentation

## 2016-12-13 DIAGNOSIS — Z3A12 12 weeks gestation of pregnancy: Secondary | ICD-10-CM | POA: Insufficient documentation

## 2016-12-13 DIAGNOSIS — O34211 Maternal care for low transverse scar from previous cesarean delivery: Secondary | ICD-10-CM | POA: Insufficient documentation

## 2016-12-18 ENCOUNTER — Encounter: Payer: Self-pay | Admitting: *Deleted

## 2016-12-18 ENCOUNTER — Other Ambulatory Visit: Payer: Self-pay

## 2016-12-19 ENCOUNTER — Other Ambulatory Visit: Payer: Self-pay | Admitting: Certified Nurse Midwife

## 2016-12-19 DIAGNOSIS — O0990 Supervision of high risk pregnancy, unspecified, unspecified trimester: Secondary | ICD-10-CM

## 2016-12-22 ENCOUNTER — Other Ambulatory Visit: Payer: Medicaid Other

## 2016-12-22 VITALS — Temp 98.5°F

## 2016-12-22 DIAGNOSIS — R6883 Chills (without fever): Secondary | ICD-10-CM

## 2016-12-22 LAB — INFLUENZA A AND B
INFLUENZA A AG, EIA: NEGATIVE
INFLUENZA B AG, EIA: NEGATIVE

## 2016-12-22 LAB — PLEASE NOTE:

## 2016-12-22 NOTE — Progress Notes (Signed)
Nurse visit for flu swab. 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 Now she is having body aches and chills. Denies any other sx's. Temp today 98.5. Flu swab sent STAT.

## 2016-12-22 NOTE — Addendum Note (Signed)
Addended by: Maryruth Eve on: 12/22/2016 08:39 AM   Modules accepted: Orders

## 2016-12-26 ENCOUNTER — Emergency Department (HOSPITAL_COMMUNITY)
Admission: EM | Admit: 2016-12-26 | Discharge: 2016-12-26 | Disposition: A | Discharge status: Home / Self Care | Payer: Medicaid Other | Attending: Emergency Medicine | Admitting: Emergency Medicine

## 2016-12-26 ENCOUNTER — Encounter (HOSPITAL_COMMUNITY): Payer: Self-pay | Admitting: Emergency Medicine

## 2016-12-26 DIAGNOSIS — R6889 Other general symptoms and signs: Secondary | ICD-10-CM

## 2016-12-26 DIAGNOSIS — J4521 Mild intermittent asthma with (acute) exacerbation: Secondary | ICD-10-CM

## 2016-12-26 DIAGNOSIS — Z91040 Latex allergy status: Secondary | ICD-10-CM | POA: Diagnosis not present

## 2016-12-26 DIAGNOSIS — R112 Nausea with vomiting, unspecified: Secondary | ICD-10-CM | POA: Insufficient documentation

## 2016-12-26 DIAGNOSIS — J069 Acute upper respiratory infection, unspecified: Secondary | ICD-10-CM

## 2016-12-26 DIAGNOSIS — R05 Cough: Secondary | ICD-10-CM | POA: Diagnosis present

## 2016-12-26 LAB — CBC WITH DIFFERENTIAL/PLATELET
BASOS PCT: 0 %
Basophils Absolute: 0 10*3/uL (ref 0.0–0.1)
EOS ABS: 0.2 10*3/uL (ref 0.0–0.7)
EOS PCT: 2 %
HCT: 33 % — ABNORMAL LOW (ref 36.0–46.0)
Hemoglobin: 10.9 g/dL — ABNORMAL LOW (ref 12.0–15.0)
LYMPHS ABS: 1.2 10*3/uL (ref 0.7–4.0)
Lymphocytes Relative: 12 %
MCH: 27.1 pg (ref 26.0–34.0)
MCHC: 33 g/dL (ref 30.0–36.0)
MCV: 82.1 fL (ref 78.0–100.0)
MONO ABS: 0.6 10*3/uL (ref 0.1–1.0)
MONOS PCT: 6 %
Neutro Abs: 7.7 10*3/uL (ref 1.7–7.7)
Neutrophils Relative %: 80 %
PLATELETS: 261 10*3/uL (ref 150–400)
RBC: 4.02 MIL/uL (ref 3.87–5.11)
RDW: 13.5 % (ref 11.5–15.5)
WBC: 9.6 10*3/uL (ref 4.0–10.5)

## 2016-12-26 LAB — URINALYSIS, ROUTINE W REFLEX MICROSCOPIC
BILIRUBIN URINE: NEGATIVE
Glucose, UA: NEGATIVE mg/dL
KETONES UR: NEGATIVE mg/dL
LEUKOCYTES UA: NEGATIVE
NITRITE: NEGATIVE
PH: 5 (ref 5.0–8.0)
Protein, ur: NEGATIVE mg/dL
SPECIFIC GRAVITY, URINE: 1.02 (ref 1.005–1.030)

## 2016-12-26 LAB — COMPREHENSIVE METABOLIC PANEL
ALBUMIN: 3.3 g/dL — AB (ref 3.5–5.0)
ALK PHOS: 39 U/L (ref 38–126)
ALT: 23 U/L (ref 14–54)
ANION GAP: 5 (ref 5–15)
AST: 37 U/L (ref 15–41)
BUN: 13 mg/dL (ref 6–20)
CALCIUM: 9.1 mg/dL (ref 8.9–10.3)
CHLORIDE: 107 mmol/L (ref 101–111)
CO2: 25 mmol/L (ref 22–32)
Creatinine, Ser: 0.78 mg/dL (ref 0.44–1.00)
GFR calc non Af Amer: >60 mL/min (ref >60)
GLUCOSE: 104 mg/dL — AB (ref 65–99)
POTASSIUM: 3.8 mmol/L (ref 3.5–5.1)
SODIUM: 137 mmol/L (ref 135–145)
Total Bilirubin: <0.1 mg/dL — ABNORMAL LOW (ref 0.3–1.2)
Total Protein: 6.9 g/dL (ref 6.5–8.1)

## 2016-12-26 MED ORDER — SODIUM CHLORIDE 0.9 % IV BOLUS (SEPSIS)
1000.0000 mL | Freq: Once | INTRAVENOUS | Status: AC
  Administered 2016-12-26: 1000 mL via INTRAVENOUS

## 2016-12-26 MED ORDER — DIPHENHYDRAMINE HCL 50 MG/ML IJ SOLN
25.0000 mg | Freq: Once | INTRAMUSCULAR | Status: AC
  Administered 2016-12-26: 25 mg via INTRAVENOUS
  Filled 2016-12-26: qty 1

## 2016-12-26 MED ORDER — OSELTAMIVIR PHOSPHATE 75 MG PO CAPS: 75.0000 mg | ORAL_CAPSULE | Freq: Two times a day (BID) | ORAL | 0 refills | Status: DC

## 2016-12-26 MED ORDER — METOCLOPRAMIDE HCL 5 MG/ML IJ SOLN
10.0000 mg | Freq: Once | INTRAMUSCULAR | Status: AC
  Administered 2016-12-26: 10 mg via INTRAVENOUS
  Filled 2016-12-26: qty 2

## 2016-12-26 MED ORDER — ALBUTEROL SULFATE HFA 108 (90 BASE) MCG/ACT IN AERS: 2.0000 | INHALATION_SPRAY | RESPIRATORY_TRACT | 2 refills | Status: DC | PRN

## 2016-12-26 MED ORDER — ALBUTEROL SULFATE (2.5 MG/3ML) 0.083% IN NEBU
2.5000 mg | INHALATION_SOLUTION | Freq: Once | RESPIRATORY_TRACT | Status: AC
  Administered 2016-12-26: 2.5 mg via RESPIRATORY_TRACT
  Filled 2016-12-26: qty 3

## 2016-12-26 NOTE — ED Triage Notes (Signed)
Pt brought in by EMS for c/o emesis and cough  Pt is 4 months pregnant  Pt states she woke up with a lot of phlegm and productive cough   Pt states she has had blood in her emesis  EMS states pt has only been coughing and spitting in a bag for them and no blood is present  Pt was seen at a hospital yesterday for same

## 2016-12-26 NOTE — ED Provider Notes (Signed)
[place] DEPT Provider Note   CSN: [id number] Arrival date & time: 12/26/16  0110  By signing my name below, I, Gwenlyn Fudge, attest that this documentation has been prepared under the direction and in the presence of Orpah Greek, MD. Electronically Signed: Gwenlyn Fudge, ED Scribe. 12/26/16. 1:27 AM.   History   Chief Complaint Chief Complaint  Patient presents with  . Emesis  . Cough   The history is provided by the patient. No language interpreter was used.   HPI Comments: Connie Jenkins is a 36 y.o. female with PMHx of Asthma who presents to the Emergency Department complaining of gradual onset, persistent, moderate productive cough beginning today. She states she has noticed streaks of blood in phlegm. She is currently [redacted] weeks pregnant. She was seen on 1/26 and tested negative for the flu. Pt reports post-tussive, vomiting, abdominal pain, chest congestion, chest pain, back pain, shortness of breath, slight dysuria. She reports known sick contact at work. She denies diarrhea.   Past Medical History:  Diagnosis Date  . Anxiety    no meds  . Asthma    inhaler rarely used - twice year  . Depression    no current Tx.  . Febrile illness, acute 01/2011   Hospitalized for 6 days  . Headache   . Herpes 2008  . Hx of bronchitis 09/2012  . IBS (irritable bowel syndrome)   . Renal stone 08/26/2014  . Seasonal allergies     Patient Active Problem List   Diagnosis Date Noted  . Previous cesarean section complicating pregnancy, antepartum condition or complication [id number]  . Supervision of high risk pregnancy, antepartum 11/13/2016  . History of preterm delivery, currently pregnant 11/13/2016  . Genital herpes complicating pregnancy in antepartum condition, first trimester 11/13/2016  . Migraines 11/13/2016  . 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 CESAREAN SECTION  2000 & 2004   "1st one I dilated 1cm, then  the 2nd one was a rpt  . CYSTOSCOPY WITH RETROGRADE PYELOGRAM, URETEROSCOPY AND STENT PLACEMENT Left 08/26/2014   Procedure: CYSTOSCOPY WITH RETROGRADE PYELOGRAM, URETEROSCOPY, LASER, STONE EXTRACTION WITH BASKET;  Surgeon: Raynelle Bring, MD;  Location: [facility];  Service: Urology;  Laterality: Left;  . HERNIA REPAIR    . HYSTEROSCOPY N/A 03/24/2013   Procedure: HYSTEROSCOPY / Dilitation and curettage/endometrial curettings;  Surgeon: Lavonia Drafts, MD;  Location: [facility];  Service: Gynecology;  Laterality: N/A;  Diagnostic hysteroscopy  . TMJ ARTHROPLASTY    . WISDOM TOOTH EXTRACTION      OB History    Gravida Para Term Preterm AB Living   4 2 1 1 1 2    SAB TAB Ectopic Multiple Live Births   1       2       Home Medications    Prior to Admission medications   Medication Sig Start Date End Date Taking? Authorizing Provider  Prenatal Vit-Fe Fumarate-FA (PRENATAL VITAMIN PO) Take 1 tablet by mouth daily.    Yes Historical Provider, MD  albuterol (PROVENTIL HFA;VENTOLIN HFA) 108 (90 Base) MCG/ACT inhaler Inhale 2 puffs into the lungs every 4 (four) hours as needed for wheezing or shortness of breath. 12/26/16   Orpah Greek, MD  Doxylamine-Pyridoxine 10-10 MG TBEC Take 2 tabs at bedtime. If symptoms persist after 2 days, take 1 tab in the morning and 2 tabs at bedtime Patient not taking: Reported on 12/26/2016 11/13/16   Mora Bellman, MD  glycopyrrolate (  ROBINUL) 1 MG tablet Take 1 tablet (1 mg total) by mouth 3 (three) times daily. Patient not taking: Reported on 12/26/2016 11/22/16   Mora Bellman, MD  oseltamivir (TAMIFLU) 75 MG capsule Take 1 capsule (75 mg total) by mouth every 12 (twelve) hours. 12/26/16   Orpah Greek, MD    Family History Family History  Problem Relation Age of Onset  . Hypertension Mother   . Diabetes Mother     Social History Social History  Substance Use Topics  . Smoking status: Never Smoker  . Smokeless tobacco: Never Used  .  Alcohol use No     Comment: before +UPT      Allergies   Flagyl [metronidazole]; Latex; and Penicillins  Review of Systems Review of Systems  HENT: Positive for congestion.   Respiratory: Positive for cough and shortness of breath.   Cardiovascular: Positive for chest pain.  Gastrointestinal: Positive for abdominal pain and vomiting. Negative for diarrhea.  Musculoskeletal: Positive for back pain.  All other systems reviewed and are negative.  Physical Exam Updated Vital Signs BP 109/62 (BP Location: Right Arm)   Pulse 106   Temp 98.6 F (37 C) (Oral)   Resp 18   Ht 5\' 3"  (1.6 m)   Wt 222 lb (100.7 kg)   LMP 09/15/2016   SpO2 93%   BMI 39.33 kg/m   Physical Exam  Constitutional: She is oriented to person, place, and time. She appears well-developed and well-nourished. No distress.  HENT:  Head: Normocephalic and atraumatic.  Right Ear: Hearing normal.  Left Ear: Hearing normal.  Nose: Nose normal.  Mouth/Throat: Oropharynx is clear and moist and mucous membranes are normal.  Eyes: Conjunctivae and EOM are normal. Pupils are equal, round, and reactive to light.  Neck: Normal range of motion. Neck supple.  Cardiovascular: Regular rhythm, S1 normal and S2 normal.  Exam reveals no gallop and no friction rub.   No murmur heard. Pulmonary/Chest: Effort normal and breath sounds normal. No respiratory distress. She exhibits no tenderness.  Abdominal: Soft. Normal appearance and bowel sounds are normal. There is no hepatosplenomegaly. There is no tenderness. There is no rebound, no guarding, no tenderness at McBurney's point and negative Murphy's sign. No hernia.  Musculoskeletal: Normal range of motion.  Neurological: She is alert and oriented to person, place, and time. She has normal strength. No cranial nerve deficit or sensory deficit. Coordination normal. GCS eye subscore is 4. GCS verbal subscore is 5. GCS motor subscore is 6.  Skin: Skin is warm, dry and intact. No rash  noted. No cyanosis.  Psychiatric: She has a normal mood and affect. Her speech is normal and behavior is normal. Thought content normal.  Nursing note and vitals reviewed.    ED Treatments / Results  DIAGNOSTIC STUDIES: Oxygen Saturation is 98% on RA, normal by my interpretation.    COORDINATION OF CARE: 1:25 AM Discussed treatment plan with pt at bedside which includes labs, IVF, nebulizer and pt agreed to plan.  Labs (all labs ordered are listed, but only abnormal results are displayed) Labs Reviewed  CBC WITH DIFFERENTIAL/PLATELET - Abnormal; Notable for the following:       Result Value   Hemoglobin 10.9 (*)    HCT 33.0 (*)    All other components within normal limits  COMPREHENSIVE METABOLIC PANEL - Abnormal; Notable for the following:    Glucose, Bld 104 (*)    Albumin 3.3 (*)    Total Bilirubin <0.1 (*)  All other components within normal limits  URINALYSIS, ROUTINE W REFLEX MICROSCOPIC - Abnormal; Notable for the following:    APPearance HAZY (*)    Hgb urine dipstick SMALL (*)    Bacteria, UA MANY (*)    Squamous Epithelial / LPF 6-30 (*)    All other components within normal limits    EKG  EKG Interpretation None       Radiology No results found.  Procedures Procedures (including critical care time)  Medications Ordered in ED Medications  sodium chloride 0.9 % bolus 1,000 mL (0 mLs Intravenous Stopped 12/26/16 0249)  metoCLOPramide (REGLAN) injection 10 mg (10 mg Intravenous Given 12/26/16 0153)  diphenhydrAMINE (BENADRYL) injection 25 mg (25 mg Intravenous Given 12/26/16 0152)  albuterol (PROVENTIL) (2.5 MG/3ML) 0.083% nebulizer solution 2.5 mg (2.5 mg Nebulization Given 12/26/16 0212)     Initial Impression / Assessment and Plan / ED Course  I have reviewed the triage vital signs and the nursing notes.  Pertinent labs & imaging results that were available during my care of the patient were reviewed by me and considered in my medical decision making  (see chart for details).    Patient presents with complaints of cough and chest congestion with shortness of breath. She has been experiencing nausea and vomiting. She is not febrile but symptoms are consistent with influenza. She does have a history of asthma. She denies any significant bronchospasm. Her breathing and coughing improved after nebulizer. Patient to minister IV fluids, Reglan. She has not had any further nausea or vomiting. Patient reports that she has pregnant, approximately 15 weeks. She has had prenatal care. There was some upper abdominal cramping earlier but no lower pelvic pain. No vaginal bleeding or discharge.  I personally performed the services described in this documentation, which was scribed in my presence. The recorded information has been reviewed and is accurate.   Final Clinical Impressions(s) / ED Diagnoses   Final diagnoses:  Upper respiratory tract infection, unspecified type  Flu-like symptoms  Mild intermittent asthma with acute exacerbation  Non-intractable vomiting with nausea, unspecified vomiting type    New Prescriptions New Prescriptions   ALBUTEROL (PROVENTIL HFA;VENTOLIN HFA) 108 (90 BASE) MCG/ACT INHALER    Inhale 2 puffs into the lungs every 4 (four) hours as needed for wheezing or shortness of breath.   OSELTAMIVIR (TAMIFLU) 75 MG CAPSULE    Take 1 capsule (75 mg total) by mouth every 12 (twelve) hours.     Orpah Greek, MD 12/26/16 [phone]

## 2016-12-26 NOTE — ED Notes (Signed)
Pt reporting improvement in symptoms after medications. Pt resting in bed at this time with no acute distress noted.

## 2016-12-29 ENCOUNTER — Encounter: Payer: Self-pay | Admitting: *Deleted

## 2017-01-09 ENCOUNTER — Ambulatory Visit (INDEPENDENT_AMBULATORY_CARE_PROVIDER_SITE_OTHER): Payer: Medicaid Other | Admitting: Certified Nurse Midwife

## 2017-01-09 ENCOUNTER — Other Ambulatory Visit (HOSPITAL_COMMUNITY)
Admission: RE | Admit: 2017-01-09 | Discharge: 2017-01-09 | Disposition: A | Discharge status: Home / Self Care | Payer: Medicaid Other | Source: Ambulatory Visit | Attending: Certified Nurse Midwife | Admitting: Certified Nurse Midwife

## 2017-01-09 VITALS — BP 105/70 | HR 82 | Wt 224.5 lb

## 2017-01-09 DIAGNOSIS — O34219 Maternal care for unspecified type scar from previous cesarean delivery: Secondary | ICD-10-CM

## 2017-01-09 DIAGNOSIS — O26892 Other specified pregnancy related conditions, second trimester: Secondary | ICD-10-CM | POA: Insufficient documentation

## 2017-01-09 DIAGNOSIS — Z3A16 16 weeks gestation of pregnancy: Secondary | ICD-10-CM | POA: Insufficient documentation

## 2017-01-09 DIAGNOSIS — A630 Anogenital (venereal) warts: Secondary | ICD-10-CM

## 2017-01-09 DIAGNOSIS — O9989 Other specified diseases and conditions complicating pregnancy, childbirth and the puerperium: Secondary | ICD-10-CM

## 2017-01-09 DIAGNOSIS — O0990 Supervision of high risk pregnancy, unspecified, unspecified trimester: Secondary | ICD-10-CM

## 2017-01-09 DIAGNOSIS — N899 Noninflammatory disorder of vagina, unspecified: Secondary | ICD-10-CM | POA: Insufficient documentation

## 2017-01-09 DIAGNOSIS — O09219 Supervision of pregnancy with history of pre-term labor, unspecified trimester: Secondary | ICD-10-CM

## 2017-01-09 DIAGNOSIS — O09212 Supervision of pregnancy with history of pre-term labor, second trimester: Secondary | ICD-10-CM | POA: Diagnosis not present

## 2017-01-09 DIAGNOSIS — O09899 Supervision of other high risk pregnancies, unspecified trimester: Secondary | ICD-10-CM

## 2017-01-09 DIAGNOSIS — N898 Other specified noninflammatory disorders of vagina: Secondary | ICD-10-CM

## 2017-01-09 DIAGNOSIS — Z3492 Encounter for supervision of normal pregnancy, unspecified, second trimester: Secondary | ICD-10-CM

## 2017-01-09 DIAGNOSIS — G43C1 Periodic headache syndromes in child or adult, intractable: Secondary | ICD-10-CM

## 2017-01-09 LAB — POCT URINALYSIS DIPSTICK
Bilirubin, UA: NEGATIVE
Blood, UA: NEGATIVE
Glucose, UA: NEGATIVE
Ketones, UA: NEGATIVE
LEUKOCYTES UA: NEGATIVE
NITRITE UA: NEGATIVE
PH UA: 6.5
PROTEIN UA: NEGATIVE
Spec Grav, UA: 1.015
UROBILINOGEN UA: NEGATIVE

## 2017-01-09 NOTE — Progress Notes (Signed)
   PRENATAL VISIT NOTE  Subjective:  Connie Jenkins is a 36 y.o. [phone] at [redacted]w[redacted]d being seen today for ongoing prenatal care.  She is currently monitored for the following issues for this high-risk pregnancy and has CONDYLOMA ACUMINATUM; Previous cesarean section complicating pregnancy, antepartum condition or complication; Supervision of high risk pregnancy, antepartum; History of preterm delivery, currently pregnant; Genital herpes complicating pregnancy in antepartum condition, first trimester; and Migraines on her problem list.  Patient reports no bleeding, no contractions, no cramping, no leaking and vaginal irritation.  Contractions: Not present. Vag. Bleeding: None.  Movement: Present. Denies leaking of fluid.   The following portions of the patient's history were reviewed and updated as appropriate: allergies, current medications, past family history, past medical history, past social history, past surgical history and problem list. Problem list updated.  Objective:   Vitals:   01/09/17 0920  BP: 105/70  Pulse: 82  Weight: 224 lb 8 oz (101.8 kg)    Fetal Status: Fetal Heart Rate (bpm): 145   Movement: Present     General:  Alert, oriented and cooperative. Patient is in no acute distress.  Skin: Skin is warm and dry. No rash noted.   Cardiovascular: Normal heart rate noted  Respiratory: Normal respiratory effort, no problems with respiration noted  Abdomen: Soft, gravid, appropriate for gestational age. Pain/Pressure: Present     Pelvic:  Cervical exam deferred        Extremities: Normal range of motion.     Mental Status: Normal mood and affect. Normal behavior. Normal judgment and thought content.   Assessment and Plan:  Pregnancy: [id number] at [redacted]w[redacted]d  1. Supervision of high risk pregnancy, antepartum, Second trimester.   POCT urinalysis dipstick 17-P injections started. Will receive weekly in deltoid, hx of bilateral gluteal implants with scar tissue and fat build up.    2. Vaginal odor - Cervicovaginal ancillary only   3. Previous cesarean section complicating pregnancy, antepartum condition or complication     Repeat C-section planned.   4. History of preterm delivery, currently pregnant     Weekly 17-P injections  5. Condyloma acuminatum     stable   Preterm labor symptoms and general obstetric precautions including but not limited to vaginal bleeding, contractions, leaking of fluid and fetal movement were reviewed in detail with the patient. Please refer to After Visit Summary for other counseling recommendations.  Return in about 4 weeks (around 02/06/2017) for [facility].   Morene Crocker, CNM

## 2017-01-10 LAB — CERVICOVAGINAL ANCILLARY ONLY
Bacterial vaginitis: NEGATIVE
Candida vaginitis: NEGATIVE
Chlamydia: NEGATIVE
Neisseria Gonorrhea: NEGATIVE
Trichomonas: NEGATIVE

## 2017-01-16 ENCOUNTER — Ambulatory Visit: Payer: Medicaid Other

## 2017-01-23 ENCOUNTER — Ambulatory Visit: Payer: Medicaid Other

## 2017-01-24 ENCOUNTER — Encounter (HOSPITAL_COMMUNITY): Payer: Self-pay

## 2017-01-24 ENCOUNTER — Ambulatory Visit (HOSPITAL_COMMUNITY)
Admission: RE | Admit: 2017-01-24 | Discharge: 2017-01-24 | Disposition: A | Discharge status: Home / Self Care | Payer: Medicaid Other | Source: Ambulatory Visit | Attending: Certified Nurse Midwife | Admitting: Certified Nurse Midwife

## 2017-01-24 ENCOUNTER — Ambulatory Visit (INDEPENDENT_AMBULATORY_CARE_PROVIDER_SITE_OTHER): Payer: Medicaid Other

## 2017-01-24 VITALS — BP 113/74 | HR 75

## 2017-01-24 DIAGNOSIS — O09212 Supervision of pregnancy with history of pre-term labor, second trimester: Secondary | ICD-10-CM | POA: Insufficient documentation

## 2017-01-24 DIAGNOSIS — O09899 Supervision of other high risk pregnancies, unspecified trimester: Secondary | ICD-10-CM

## 2017-01-24 DIAGNOSIS — Z3A18 18 weeks gestation of pregnancy: Secondary | ICD-10-CM | POA: Insufficient documentation

## 2017-01-24 DIAGNOSIS — O34211 Maternal care for low transverse scar from previous cesarean delivery: Secondary | ICD-10-CM | POA: Diagnosis not present

## 2017-01-24 DIAGNOSIS — Z363 Encounter for antenatal screening for malformations: Secondary | ICD-10-CM | POA: Diagnosis not present

## 2017-01-24 DIAGNOSIS — O0990 Supervision of high risk pregnancy, unspecified, unspecified trimester: Secondary | ICD-10-CM

## 2017-01-24 DIAGNOSIS — O99212 Obesity complicating pregnancy, second trimester: Secondary | ICD-10-CM | POA: Insufficient documentation

## 2017-01-24 DIAGNOSIS — O09219 Supervision of pregnancy with history of pre-term labor, unspecified trimester: Principal | ICD-10-CM

## 2017-01-25 ENCOUNTER — Other Ambulatory Visit: Payer: Self-pay | Admitting: Certified Nurse Midwife

## 2017-01-25 DIAGNOSIS — O0990 Supervision of high risk pregnancy, unspecified, unspecified trimester: Secondary | ICD-10-CM

## 2017-01-30 ENCOUNTER — Ambulatory Visit: Payer: Medicaid Other

## 2017-02-06 ENCOUNTER — Encounter: Payer: Medicaid Other | Admitting: Obstetrics and Gynecology

## 2017-02-07 ENCOUNTER — Ambulatory Visit (INDEPENDENT_AMBULATORY_CARE_PROVIDER_SITE_OTHER): Payer: Medicaid Other | Admitting: Obstetrics & Gynecology

## 2017-02-07 ENCOUNTER — Encounter: Payer: Self-pay | Admitting: Obstetrics & Gynecology

## 2017-02-07 VITALS — BP 110/73 | HR 90 | Wt 230.0 lb

## 2017-02-07 DIAGNOSIS — O09212 Supervision of pregnancy with history of pre-term labor, second trimester: Secondary | ICD-10-CM | POA: Diagnosis not present

## 2017-02-07 DIAGNOSIS — O0990 Supervision of high risk pregnancy, unspecified, unspecified trimester: Secondary | ICD-10-CM

## 2017-02-07 MED ORDER — FLUCONAZOLE 150 MG PO TABS: 150.0000 mg | ORAL_TABLET | Freq: Once | ORAL | 0 refills | Status: AC

## 2017-02-07 NOTE — Patient Instructions (Signed)

## 2017-02-07 NOTE — Progress Notes (Signed)
Pt states she needs treatment for yeast infection. Pt would like to discuss 17 p injections, pt states that she does not want to get them.  Pt has gotten them in her deltoid due to changes in gluteal tissue after silicone placement.

## 2017-02-07 NOTE — Progress Notes (Signed)
Reviewed her Hx, she declines to continue 28 P, explained this is meant to decrease risk of PTB   PRENATAL VISIT NOTE  Subjective:  Connie Jenkins is a 36 y.o. [phone] at [redacted]w[redacted]d being seen today for ongoing prenatal care.  She is currently monitored for the following issues for this high-risk pregnancy and has CONDYLOMA ACUMINATUM; Previous cesarean section complicating pregnancy, antepartum condition or complication; Supervision of high risk pregnancy, antepartum; History of preterm delivery, currently pregnant; Genital herpes complicating pregnancy in antepartum condition, first trimester; and Migraines on her problem list.  Patient reports no complaints.  Contractions: Not present. Vag. Bleeding: None.  Movement: Present. Denies leaking of fluid.   The following portions of the patient's history were reviewed and updated as appropriate: allergies, current medications, past family history, past medical history, past social history, past surgical history and problem list. Problem list updated.  Objective:   Vitals:   02/07/17 0913  BP: 110/73  Pulse: 90  Weight: 104.3 kg (230 lb)    Fetal Status: Fetal Heart Rate (bpm): 155 Fundal Height: 21 cm Movement: Present     General:  Alert, oriented and cooperative. Patient is in no acute distress.  Skin: Skin is warm and dry. No rash noted.   Cardiovascular: Normal heart rate noted  Respiratory: Normal respiratory effort, no problems with respiration noted  Abdomen: Soft, gravid, appropriate for gestational age. Pain/Pressure: Absent     Pelvic:  Cervical exam deferred        Extremities: Normal range of motion.     Mental Status: Normal mood and affect. Normal behavior. Normal judgment and thought content.   Assessment and Plan:  Pregnancy: G3P1102 at [redacted]w[redacted]d  1. Supervision of high risk pregnancy, antepartum Has yeast sx - fluconazole (DIFLUCAN) 150 MG tablet; Take 1 thas yeast sxablet (150 mg total) by mouth once.  Dispense: 1 tablet;  Refill: 0 - AFP, Serum, Open Spina Bifida  Preterm labor symptoms and general obstetric precautions including but not limited to vaginal bleeding, contractions, leaking of fluid and fetal movement were reviewed in detail with the patient. Please refer to After Visit Summary for other counseling recommendations.  Return in about 4 weeks (around 03/07/2017).   Woodroe Mode, MD

## 2017-02-09 LAB — AFP, SERUM, OPEN SPINA BIFIDA
AFP MoM: 0.98
AFP Value: 84.6 ng/mL
GEST. AGE ON COLLECTION DATE: 20.5 wk
Maternal Age At EDD: 34 years
OSBR Risk 1 IN: 10000
Test Results:: NEGATIVE
WEIGHT: 104 [lb_av]

## 2017-02-20 ENCOUNTER — Encounter (HOSPITAL_COMMUNITY): Payer: Self-pay

## 2017-02-20 ENCOUNTER — Inpatient Hospital Stay (HOSPITAL_COMMUNITY)
Admission: AD | Admit: 2017-02-20 | Discharge: 2017-02-20 | Disposition: A | Discharge status: Home / Self Care | Payer: Medicaid Other | Source: Ambulatory Visit | Attending: Family Medicine | Admitting: Family Medicine

## 2017-02-20 DIAGNOSIS — O479 False labor, unspecified: Secondary | ICD-10-CM

## 2017-02-20 DIAGNOSIS — Z880 Allergy status to penicillin: Secondary | ICD-10-CM | POA: Insufficient documentation

## 2017-02-20 DIAGNOSIS — O4702 False labor before 37 completed weeks of gestation, second trimester: Secondary | ICD-10-CM | POA: Diagnosis not present

## 2017-02-20 DIAGNOSIS — O09899 Supervision of other high risk pregnancies, unspecified trimester: Secondary | ICD-10-CM

## 2017-02-20 DIAGNOSIS — O09219 Supervision of pregnancy with history of pre-term labor, unspecified trimester: Secondary | ICD-10-CM

## 2017-02-20 DIAGNOSIS — O34219 Maternal care for unspecified type scar from previous cesarean delivery: Secondary | ICD-10-CM

## 2017-02-20 DIAGNOSIS — N898 Other specified noninflammatory disorders of vagina: Secondary | ICD-10-CM | POA: Insufficient documentation

## 2017-02-20 DIAGNOSIS — Z3A22 22 weeks gestation of pregnancy: Secondary | ICD-10-CM | POA: Insufficient documentation

## 2017-02-20 DIAGNOSIS — O0990 Supervision of high risk pregnancy, unspecified, unspecified trimester: Secondary | ICD-10-CM

## 2017-02-20 DIAGNOSIS — O26892 Other specified pregnancy related conditions, second trimester: Secondary | ICD-10-CM

## 2017-02-20 LAB — WET PREP, GENITAL
CLUE CELLS WET PREP: NONE SEEN
Sperm: NONE SEEN
TRICH WET PREP: NONE SEEN
Yeast Wet Prep HPF POC: NONE SEEN

## 2017-02-20 LAB — URINALYSIS, ROUTINE W REFLEX MICROSCOPIC
BILIRUBIN URINE: NEGATIVE
GLUCOSE, UA: NEGATIVE mg/dL
HGB URINE DIPSTICK: NEGATIVE
Ketones, ur: NEGATIVE mg/dL
LEUKOCYTES UA: NEGATIVE
NITRITE: NEGATIVE
PROTEIN: 30 mg/dL — AB
SPECIFIC GRAVITY, URINE: 1.028 (ref 1.005–1.030)
pH: 5 (ref 5.0–8.0)

## 2017-02-20 NOTE — Discharge Instructions (Signed)

## 2017-02-20 NOTE — MAU Note (Signed)
Pt presents to MAU with complaints of contractions that started two weeks ago. PT states that she quit taking her 17P because " IT HURTS"  Pt denies any vaginal bleeding or LOF

## 2017-02-20 NOTE — MAU Provider Note (Signed)
History     CSN: [id number]  Arrival date and time: 02/20/17 1532   First Provider Initiated Contact with Patient 02/20/17 1705      Chief Complaint  Patient presents with  . Contractions   HPI [name] is a 36 y.o. [phone] at [redacted]w[redacted]d who presents to MAU today with complaint of contractions 2-6 times/day x 2 weeks. She denies vaginal bleeding, LOF today. She states normal fetal movement. She states a history of a PTD at 34 weeks and 2 previous C/S. She complains today of thin, white discharge with mild odor. She feels she has been eating and drinking well and denies N/V/D or constipation today.   OB History    Gravida Para Term Preterm AB Living   3 2 1 1  0 2   SAB TAB Ectopic Multiple Live Births   0       2      Past Medical History:  Diagnosis Date  . Anxiety    no meds  . Asthma    inhaler rarely used - twice year  . Depression    no current Tx.  . Febrile illness, acute 01/2011   Hospitalized for 6 days  . Headache   . Herpes 2008  . Hx of bronchitis 09/2012  . IBS (irritable bowel syndrome)   . Renal stone 08/26/2014  . Seasonal allergies     Past Surgical History:  Procedure Laterality Date  . CESAREAN SECTION  2000 & 2004   "1st one I dilated 1cm, then the 2nd one was a rpt  . CYSTOSCOPY WITH RETROGRADE PYELOGRAM, URETEROSCOPY AND STENT PLACEMENT Left 08/26/2014   Procedure: CYSTOSCOPY WITH RETROGRADE PYELOGRAM, URETEROSCOPY, LASER, STONE EXTRACTION WITH BASKET;  Surgeon: Raynelle Bring, MD;  Location: [facility];  Service: Urology;  Laterality: Left;  . HERNIA REPAIR    . HYSTEROSCOPY N/A 03/24/2013   Procedure: HYSTEROSCOPY / Dilitation and curettage/endometrial curettings;  Surgeon: Lavonia Drafts, MD;  Location: [facility];  Service: Gynecology;  Laterality: N/A;  Diagnostic hysteroscopy  . TMJ ARTHROPLASTY    . WISDOM TOOTH EXTRACTION      Family History  Problem Relation Age of Onset  . Hypertension Mother   . Diabetes Mother     Social  History  Substance Use Topics  . Smoking status: Never Smoker  . Smokeless tobacco: Never Used  . Alcohol use No     Comment: before +UPT     Allergies:  Allergies  Allergen Reactions  . Flagyl [Metronidazole] Nausea And Vomiting  . Latex Itching  . Penicillins Hives and Other (See Comments)    Childhood allergy Has patient had a PCN reaction causing immediate rash, facial/tongue/throat swelling, SOB or lightheadedness with hypotension: unknown Has patient had a PCN reaction causing severe rash involving mucus membranes or skin necrosis: unknown Has patient had a PCN reaction that required hospitalization unknown Has patient had a PCN reaction occurring within the last 10 years: no If all of the above answers are "NO", then may proceed with Cephalosporin use.      No prescriptions prior to admission.    Review of Systems  Constitutional: Negative for fever.  Gastrointestinal: Negative for abdominal pain, constipation, diarrhea, nausea and vomiting.  Genitourinary: Positive for vaginal discharge. Negative for dysuria, frequency, urgency and vaginal bleeding.   Physical Exam   Blood pressure 121/67, pulse 100, temperature 97.8 F (36.6 C), resp. rate 18, height 5\' 6"  (1.676 m), weight 236 lb (107 kg), last menstrual period 09/15/2016.  Physical Exam  Nursing note and vitals reviewed. Constitutional: She is oriented to person, place, and time. She appears well-developed and well-nourished. No distress.  HENT:  Head: Normocephalic and atraumatic.  Cardiovascular: Normal rate.   Respiratory: Effort normal.  GI: Soft. She exhibits no distension and no mass. There is no tenderness. There is no rebound and no guarding.  Genitourinary: Uterus is enlarged. Uterus is not tender. Cervix exhibits no motion tenderness. No bleeding in the vagina. Vaginal discharge (small, thin, white) found.  Neurological: She is alert and oriented to person, place, and time.  Skin: Skin is warm and  dry. No erythema.  Psychiatric: She has a normal mood and affect.   Dilation: Closed Effacement (%): Thick Cervical Position: Posterior Exam by:: Hillard Danker, PA-C   Results for orders placed or performed during the hospital encounter of 02/20/17 (from the past 24 hour(s))  Urinalysis, Routine w reflex microscopic     Status: Abnormal   Collection Time: 02/20/17  4:06 PM  Result Value Ref Range   Color, Urine YELLOW YELLOW   APPearance CLEAR CLEAR   Specific Gravity, Urine 1.028 1.005 - 1.030   pH 5.0 5.0 - 8.0   Glucose, UA NEGATIVE NEGATIVE mg/dL   Hgb urine dipstick NEGATIVE NEGATIVE   Bilirubin Urine NEGATIVE NEGATIVE   Ketones, ur NEGATIVE NEGATIVE mg/dL   Protein, ur 30 (A) NEGATIVE mg/dL   Nitrite NEGATIVE NEGATIVE   Leukocytes, UA NEGATIVE NEGATIVE   RBC / HPF 0-5 0 - 5 RBC/hpf   WBC, UA 0-5 0 - 5 WBC/hpf   Bacteria, UA RARE (A) NONE SEEN   Squamous Epithelial / LPF 0-5 (A) NONE SEEN   Mucous PRESENT   Wet prep, genital     Status: Abnormal   Collection Time: 02/20/17  5:30 PM  Result Value Ref Range   Yeast Wet Prep HPF POC NONE SEEN NONE SEEN   Trich, Wet Prep NONE SEEN NONE SEEN   Clue Cells Wet Prep HPF POC NONE SEEN NONE SEEN   WBC, Wet Prep HPF POC FEW (A) NONE SEEN   Sperm NONE SEEN      MAU Course  Procedures None  MDM FHR - 136 bpm with doppler UA today TOCO applied - no contractions noted PO hydration  Wet prep today  Assessment and Plan  A: SIUP at [redacted]w[redacted]d Braxton hicks contractions Vaginal discharge in pregnancy, second trimester  P: Discharge home Preterm labor precautions discussed Patient advised to follow-up with [facility] as scheduled for routine prenatal care or sooner if symptoms worsen Patient may return to MAU as needed or if her condition were to change or worsen   Luvenia Redden, PA-C  02/20/2017, 6:33 PM

## 2017-03-07 ENCOUNTER — Encounter: Payer: Medicaid Other | Admitting: Certified Nurse Midwife

## 2017-03-12 ENCOUNTER — Ambulatory Visit (INDEPENDENT_AMBULATORY_CARE_PROVIDER_SITE_OTHER): Payer: Medicaid Other | Admitting: Certified Nurse Midwife

## 2017-03-12 VITALS — BP 115/66 | HR 89 | Wt 236.0 lb

## 2017-03-12 DIAGNOSIS — O34219 Maternal care for unspecified type scar from previous cesarean delivery: Secondary | ICD-10-CM | POA: Diagnosis not present

## 2017-03-12 DIAGNOSIS — O09899 Supervision of other high risk pregnancies, unspecified trimester: Secondary | ICD-10-CM

## 2017-03-12 DIAGNOSIS — O0992 Supervision of high risk pregnancy, unspecified, second trimester: Secondary | ICD-10-CM

## 2017-03-12 DIAGNOSIS — O09212 Supervision of pregnancy with history of pre-term labor, second trimester: Secondary | ICD-10-CM

## 2017-03-12 DIAGNOSIS — O0990 Supervision of high risk pregnancy, unspecified, unspecified trimester: Secondary | ICD-10-CM

## 2017-03-12 DIAGNOSIS — O09219 Supervision of pregnancy with history of pre-term labor, unspecified trimester: Principal | ICD-10-CM

## 2017-03-12 NOTE — Progress Notes (Signed)
   PRENATAL VISIT NOTE  Subjective:  Connie Jenkins is a 36 y.o. G3P1102 at [redacted]w[redacted]d being seen today for ongoing prenatal care.  She is currently monitored for the following issues for this high-risk pregnancy and has CONDYLOMA ACUMINATUM; Previous cesarean section complicating pregnancy, antepartum condition or complication; Supervision of high risk pregnancy, antepartum; History of preterm delivery, currently pregnant; Genital herpes complicating pregnancy in antepartum condition, first trimester; and Migraines on her problem list.  Patient reports no complaints.  Contractions: Irregular. Vag. Bleeding: None.  Movement: Absent. Denies leaking of fluid.   The following portions of the patient's history were reviewed and updated as appropriate: allergies, current medications, past family history, past medical history, past social history, past surgical history and problem list. Problem list updated.  Objective:   Vitals:   03/12/17 1458  BP: 115/66  Pulse: 89  Weight: 236 lb (107 kg)    Fetal Status: Fetal Heart Rate (bpm): 152 Fundal Height: 25 cm Movement: Absent     General:  Alert, oriented and cooperative. Patient is in no acute distress.  Skin: Skin is warm and dry. No rash noted.   Cardiovascular: Normal heart rate noted  Respiratory: Normal respiratory effort, no problems with respiration noted  Abdomen: Soft, gravid, appropriate for gestational age. Pain/Pressure: Present     Pelvic:  Cervical exam deferred        Extremities: Normal range of motion.  Edema: Trace  Mental Status: Normal mood and affect. Normal behavior. Normal judgment and thought content.   Assessment and Plan:  Pregnancy: G3P1102 at [redacted]w[redacted]d  1. History of preterm delivery, currently pregnant 17-P declined  2. Supervision of high risk pregnancy, antepartum     Doing well  3. Previous cesarean section complicating pregnancy, antepartum condition or complication     Repeat c-section desired.   Preterm  labor symptoms and general obstetric precautions including but not limited to vaginal bleeding, contractions, leaking of fluid and fetal movement were reviewed in detail with the patient. Please refer to After Visit Summary for other counseling recommendations.  Return in about 3 weeks (around 04/02/2017) for ROB, 2 hr OGTT, HOB.   Morene Crocker, CNM

## 2017-03-15 ENCOUNTER — Telehealth: Payer: Self-pay

## 2017-03-15 DIAGNOSIS — N760 Acute vaginitis: Principal | ICD-10-CM

## 2017-03-15 DIAGNOSIS — B9689 Other specified bacterial agents as the cause of diseases classified elsewhere: Secondary | ICD-10-CM

## 2017-03-15 DIAGNOSIS — B373 Candidiasis of vulva and vagina: Secondary | ICD-10-CM

## 2017-03-15 DIAGNOSIS — B3731 Acute candidiasis of vulva and vagina: Secondary | ICD-10-CM

## 2017-03-15 MED ORDER — CLINDAMYCIN HCL 300 MG PO CAPS: 300.0000 mg | ORAL_CAPSULE | Freq: Two times a day (BID) | ORAL | 0 refills | Status: DC

## 2017-03-15 MED ORDER — FLUCONAZOLE 150 MG PO TABS
150.0000 mg | ORAL_TABLET | Freq: Once | ORAL | 0 refills | Status: AC
Start: 2017-03-15 — End: 2017-03-15

## 2017-03-15 NOTE — Telephone Encounter (Signed)
TC from pt states [facility] didn't have rx. Pt c/o vaginal odor and discharge. Clindamycin sent to the pharmacy. Pt is to wait a couple of days before taking the Diflucan to prevent another yeast infection. Pt states she was made aware how to take meds at last visit. Pt states no c/o genital warts today.

## 2017-03-19 ENCOUNTER — Inpatient Hospital Stay (HOSPITAL_COMMUNITY)
Admission: AD | Admit: 2017-03-19 | Discharge: 2017-03-20 | Disposition: A | Discharge status: Home / Self Care | Payer: Medicaid Other | Source: Ambulatory Visit | Attending: Obstetrics & Gynecology | Admitting: Obstetrics & Gynecology

## 2017-03-19 ENCOUNTER — Encounter (HOSPITAL_COMMUNITY): Payer: Self-pay | Admitting: *Deleted

## 2017-03-19 DIAGNOSIS — O26893 Other specified pregnancy related conditions, third trimester: Secondary | ICD-10-CM | POA: Diagnosis not present

## 2017-03-19 DIAGNOSIS — O34219 Maternal care for unspecified type scar from previous cesarean delivery: Secondary | ICD-10-CM | POA: Diagnosis not present

## 2017-03-19 DIAGNOSIS — Z8619 Personal history of other infectious and parasitic diseases: Secondary | ICD-10-CM | POA: Insufficient documentation

## 2017-03-19 DIAGNOSIS — Z3A34 34 weeks gestation of pregnancy: Secondary | ICD-10-CM | POA: Insufficient documentation

## 2017-03-19 DIAGNOSIS — Z9889 Other specified postprocedural states: Secondary | ICD-10-CM | POA: Diagnosis not present

## 2017-03-19 DIAGNOSIS — Z87442 Personal history of urinary calculi: Secondary | ICD-10-CM | POA: Insufficient documentation

## 2017-03-19 DIAGNOSIS — Z833 Family history of diabetes mellitus: Secondary | ICD-10-CM | POA: Diagnosis not present

## 2017-03-19 DIAGNOSIS — O09899 Supervision of other high risk pregnancies, unspecified trimester: Secondary | ICD-10-CM

## 2017-03-19 DIAGNOSIS — Z880 Allergy status to penicillin: Secondary | ICD-10-CM | POA: Diagnosis not present

## 2017-03-19 DIAGNOSIS — Z91040 Latex allergy status: Secondary | ICD-10-CM | POA: Diagnosis not present

## 2017-03-19 DIAGNOSIS — E748 Other specified disorders of carbohydrate metabolism: Secondary | ICD-10-CM

## 2017-03-19 DIAGNOSIS — R109 Unspecified abdominal pain: Secondary | ICD-10-CM | POA: Diagnosis present

## 2017-03-19 DIAGNOSIS — J45909 Unspecified asthma, uncomplicated: Secondary | ICD-10-CM | POA: Diagnosis not present

## 2017-03-19 DIAGNOSIS — Z8249 Family history of ischemic heart disease and other diseases of the circulatory system: Secondary | ICD-10-CM | POA: Insufficient documentation

## 2017-03-19 DIAGNOSIS — K589 Irritable bowel syndrome without diarrhea: Secondary | ICD-10-CM | POA: Diagnosis not present

## 2017-03-19 DIAGNOSIS — Z8751 Personal history of pre-term labor: Secondary | ICD-10-CM | POA: Diagnosis not present

## 2017-03-19 DIAGNOSIS — W06XXXA Fall from bed, initial encounter: Secondary | ICD-10-CM | POA: Diagnosis not present

## 2017-03-19 DIAGNOSIS — O99513 Diseases of the respiratory system complicating pregnancy, third trimester: Secondary | ICD-10-CM | POA: Diagnosis not present

## 2017-03-19 DIAGNOSIS — O99613 Diseases of the digestive system complicating pregnancy, third trimester: Secondary | ICD-10-CM | POA: Diagnosis not present

## 2017-03-19 DIAGNOSIS — O0990 Supervision of high risk pregnancy, unspecified, unspecified trimester: Secondary | ICD-10-CM

## 2017-03-19 DIAGNOSIS — O26899 Other specified pregnancy related conditions, unspecified trimester: Secondary | ICD-10-CM

## 2017-03-19 DIAGNOSIS — R102 Pelvic and perineal pain: Secondary | ICD-10-CM | POA: Diagnosis not present

## 2017-03-19 DIAGNOSIS — R81 Glycosuria: Secondary | ICD-10-CM | POA: Diagnosis not present

## 2017-03-19 DIAGNOSIS — O09219 Supervision of pregnancy with history of pre-term labor, unspecified trimester: Secondary | ICD-10-CM

## 2017-03-19 DIAGNOSIS — W19XXXA Unspecified fall, initial encounter: Secondary | ICD-10-CM

## 2017-03-19 DIAGNOSIS — E74818 Other disorders of glucose transport: Secondary | ICD-10-CM

## 2017-03-19 DIAGNOSIS — Z881 Allergy status to other antibiotic agents status: Secondary | ICD-10-CM | POA: Insufficient documentation

## 2017-03-19 LAB — URINALYSIS, ROUTINE W REFLEX MICROSCOPIC
Bilirubin Urine: NEGATIVE
Hgb urine dipstick: NEGATIVE
KETONES UR: NEGATIVE mg/dL
LEUKOCYTES UA: NEGATIVE
Nitrite: NEGATIVE
PH: 5 (ref 5.0–8.0)
Protein, ur: NEGATIVE mg/dL
Specific Gravity, Urine: 1.026 (ref 1.005–1.030)

## 2017-03-19 LAB — GLUCOSE, CAPILLARY
GLUCOSE-CAPILLARY: 89 mg/dL (ref 65–99)
Glucose-Capillary: 63 mg/dL — ABNORMAL LOW (ref 65–99)

## 2017-03-19 MED ORDER — ACETAMINOPHEN 500 MG PO TABS
1000.0000 mg | ORAL_TABLET | Freq: Once | ORAL | Status: AC
  Administered 2017-03-19: 1000 mg via ORAL
  Filled 2017-03-19: qty 2

## 2017-03-19 MED ORDER — ONDANSETRON 4 MG PO TBDP
4.0000 mg | ORAL_TABLET | Freq: Once | ORAL | Status: AC
  Administered 2017-03-19: 4 mg via ORAL
  Filled 2017-03-19: qty 1

## 2017-03-19 NOTE — MAU Provider Note (Signed)
History     CSN: [id number]  Arrival date and time: 03/19/17 7622   First Provider Initiated Contact with Patient 03/19/17 1939      Chief Complaint  Patient presents with  . Abdominal Pain   Connie Jenkins is a 36 y.o. [phone] at [redacted]w[redacted]d who was awakened from sleep a few hours ago with sharp lower abdominal and groin pain. The pain is intermittent. She feels pelvic pressure as if baby is about to fall out. Pain is worse when she moves and walks. It radiates to umbilicus and low back. It is associated with nausea. She ate pizza after waking up. No vomiting. She also states she had less fetal movement than usual earlier today but is now feeling lots of fetal movement. Has an occasional contraction. No vaginal bleeding or leakage of fluid. Still has a malodorous discharge and is on day 6 of a course of Flagyl for BV. Denies urinary symptoms.     OB History  Gravida Para Term Preterm AB Living  3 2 1 1  0 2  SAB TAB Ectopic Multiple Live Births  0       2    # Outcome Date GA Lbr Len/2nd Weight Sex Delivery Anes PTL Lv  3 Current           2 Term 09/18/03 [redacted]w[redacted]d  0 kg (0 lb) F CS-LTranv Spinal  LIV  1 Preterm 08/03/99 [redacted]w[redacted]d  2.325 kg (5 lb 2 oz) F CS-LTranv EPI  LIV       Past Medical History:  Diagnosis Date  . Anxiety    no meds  . Asthma    inhaler rarely used - twice year  . Depression    no current Tx.  . Febrile illness, acute 01/2011   Hospitalized for 6 days  . Headache   . Herpes 2008  . Hx of bronchitis 09/2012  . IBS (irritable bowel syndrome)   . Renal stone 08/26/2014  . Seasonal allergies     Past Surgical History:  Procedure Laterality Date  . CESAREAN SECTION  2000 & 2004   "1st one I dilated 1cm, then the 2nd one was a rpt  . CYSTOSCOPY WITH RETROGRADE PYELOGRAM, URETEROSCOPY AND STENT PLACEMENT Left 08/26/2014   Procedure: CYSTOSCOPY WITH RETROGRADE PYELOGRAM, URETEROSCOPY, LASER, STONE EXTRACTION WITH BASKET;  Surgeon: Raynelle Bring, MD;  Location: [facility];  Service: Urology;  Laterality: Left;  . HERNIA REPAIR    . HYSTEROSCOPY N/A 03/24/2013   Procedure: HYSTEROSCOPY / Dilitation and curettage/endometrial curettings;  Surgeon: Lavonia Drafts, MD;  Location: [facility];  Service: Gynecology;  Laterality: N/A;  Diagnostic hysteroscopy  . TMJ ARTHROPLASTY    . WISDOM TOOTH EXTRACTION      Family History  Problem Relation Age of Onset  . Hypertension Mother   . Diabetes Mother     Social History  Substance Use Topics  . Smoking status: Never Smoker  . Smokeless tobacco: Never Used  . Alcohol use No     Comment: before +UPT     Allergies:  Allergies  Allergen Reactions  . Flagyl [Metronidazole] Nausea And Vomiting  . Latex Itching  . Penicillins Hives and Other (See Comments)    Childhood allergy Has patient had a PCN reaction causing immediate rash, facial/tongue/throat swelling, SOB or lightheadedness with hypotension: unknown Has patient had a PCN reaction causing severe rash involving mucus membranes or skin necrosis: unknown Has patient had a PCN reaction that required hospitalization unknown Has patient had a  PCN reaction occurring within the last 10 years: no If all of the above answers are "NO", then may proceed with Cephalosporin use.      Facility-Administered Medications Prior to Admission  Medication Dose Route Frequency Provider Last Rate Last Dose  . hydroxyprogesterone caproate (MAKENA) 250 mg/mL injection 250 mg  250 mg Intramuscular Weekly Peggy Constant, MD   250 mg at 01/24/17 1029   Prescriptions Prior to Admission  Medication Sig Dispense Refill Last Dose  . acetaminophen (TYLENOL) 500 MG tablet Take 500 mg by mouth every 6 (six) hours as needed for headache.   Not Taking  . clindamycin (CLEOCIN) 300 MG capsule Take 1 capsule (300 mg total) by mouth 2 (two) times daily. For seven days 14 capsule 0     Review of Systems Physical Exam   Blood pressure 110/75, pulse 93, temperature 98.4 F (36.9  C), temperature source Oral, resp. rate 20, height 5' 3.5" (1.613 m), weight 109.2 kg (240 lb 12 oz), last menstrual period 09/15/2016. Vitals:   03/19/17 2020 03/19/17 2025 03/19/17 2030 03/19/17 2106  BP:    114/60  Pulse: (!) 104 98 89 84  Resp:    20  Temp:      TempSrc:      SpO2: 100% 97% 99% 99%  Weight:      Height:       Physical Exam  Nursing note and vitals reviewed. Constitutional: She is oriented to person, place, and time. She appears well-developed and well-nourished. No distress.  HENT:  Head: Normocephalic.  Eyes: Conjunctivae are normal.  Neck: Neck supple. No thyromegaly present.  Cardiovascular: Normal rate.   Respiratory: Effort normal.  GI: Soft. There is tenderness. There is no rebound and no guarding.  Mild diffuse tenderness in both lower quadrants and groin, most pronounced left suprapubic.  Genitourinary: Vagina normal.  Genitourinary Comments: SVE: Cervix posterior slightly soft, long, closed, presenting part high  Neurological: She is alert and oriented to person, place, and time.  Skin: Skin is warm and dry.  Psychiatric: She has a normal mood and affect. Her behavior is normal. Thought content normal.    MAU Course  Procedures Results for orders placed or performed during the hospital encounter of 03/19/17 (from the past 24 hour(s))  Urinalysis, Routine w reflex microscopic     Status: Abnormal   Collection Time: 03/19/17  7:22 PM  Result Value Ref Range   Color, Urine YELLOW YELLOW   APPearance HAZY (A) CLEAR   Specific Gravity, Urine 1.026 1.005 - 1.030   pH 5.0 5.0 - 8.0   Glucose, UA >=500 (A) NEGATIVE mg/dL   Hgb urine dipstick NEGATIVE NEGATIVE   Bilirubin Urine NEGATIVE NEGATIVE   Ketones, ur NEGATIVE NEGATIVE mg/dL   Protein, ur NEGATIVE NEGATIVE mg/dL   Nitrite NEGATIVE NEGATIVE   Leukocytes, UA NEGATIVE NEGATIVE   RBC / HPF 0-5 0 - 5 RBC/hpf   WBC, UA 0-5 0 - 5 WBC/hpf   Bacteria, UA RARE (A) NONE SEEN   Squamous Epithelial /  LPF 0-5 (A) NONE SEEN   Mucous PRESENT   Glucose, capillary     Status: Abnormal   Collection Time: 03/19/17  8:06 PM  Result Value Ref Range   Glucose-Capillary 63 (L) 65 - 99 mg/dL   Fetal monitoring:  FHR baseline 150, moderate variability, accelerations present, no decelerations toco: No contractions  Zofran 4mg  ODT given Acetaminophen 1000mg  po given  ADDENDUM At 2100 patient fell from the bed while reaching for  the garbage can. Felt nauseated and dizzy. Broke the fall with arms and landed on left side. Left elbow is sore; full ROM. WIll recheck CBG, VS and proceed with prolonged EFM.  2300: Marcille Buffy CNM assumed care FHT has remained reactive throughout 4 hours of monitoring. Will DC home.   Assessment and Plan  36 yo g3P1102 at [redacted]w[redacted]d Fetal well-being by EFM 1. Pain of round ligament affecting pregnancy, antepartum   2. Previous cesarean section complicating pregnancy, antepartum condition or complication   3. Supervision of high risk pregnancy, antepartum   4. History of preterm delivery, currently pregnant   5. Renal glucosuria ([place])   6. Fall, initial encounter    DC home Comfort measures reviewed  3rd Trimester precautions  PTL precautions  Fetal kick counts RX: none  Return to MAU as needed FU with OB as planned  [place] for [facility] Follow up.   Specialty:  Obstetrics and Gynecology Contact information: [address] [place] [phone]          Lorene Dy, CNM 03/19/2017   Mathis Bud 03/20/17  10:51 PM

## 2017-03-19 NOTE — MAU Note (Signed)
Pt given ice pack for her elbow.

## 2017-03-19 NOTE — MAU Note (Signed)
Pt said pain on her elbow is improved with the ice.

## 2017-03-19 NOTE — MAU Note (Signed)
Came to check on pt to see how the medication helped and found patient on the floor laying on her left side. She said she wasn't feeling well and tried to get up to get to the trash can and fell. No signs of injuries or bleeding. Pt put back on fetal monitor and VS taken all WNL. CBG also checked again. Pt said she caught her fall with her hands, but also landed on her elbow. Currently complaining of elbow pain 7/10.

## 2017-03-19 NOTE — MAU Note (Signed)
PT  SAYS SHE WAS ASLEEP - WOKE AT  640PM-  WITH  LOWER ABD  PAIN - CONTINUES.   NAUSEATED -   ATE  AT 5PM-  PIZZA ,  NO VOMITING/ NO DIARRHEA .   KIDS  ATE  PIZZA - THEY ARE  OK.      BABY IS  MOVING  LESS  THAN   NORMAL.    [place] - WITH [facility]INA.- ALL OK.        LAST SEX- 09-2016.    LAST WEEK WAS TX   FOR  BV-  .1 DAY LEFT ON MEDS.

## 2017-03-20 DIAGNOSIS — O26899 Other specified pregnancy related conditions, unspecified trimester: Secondary | ICD-10-CM | POA: Diagnosis not present

## 2017-03-20 DIAGNOSIS — R102 Pelvic and perineal pain: Secondary | ICD-10-CM | POA: Diagnosis not present

## 2017-03-20 NOTE — Discharge Instructions (Signed)
Third Trimester of Pregnancy The third trimester is from week 28 through week 40 (months 7 through 9). The third trimester is a time when the unborn baby (fetus) is growing rapidly. At the end of the ninth month, the fetus is about 20 inches in length and weighs 6-10 pounds. Body changes during your third trimester Your body will continue to go through many changes during pregnancy. The changes vary from woman to woman. During the third trimester:  Your weight will continue to increase. You can expect to gain 25-35 pounds (11-16 kg) by the end of the pregnancy.  You may begin to get stretch marks on your hips, abdomen, and breasts.  You may urinate more often because the fetus is moving lower into your pelvis and pressing on your bladder.  You may develop or continue to have heartburn. This is caused by increased hormones that slow down muscles in the digestive tract.  You may develop or continue to have constipation because increased hormones slow digestion and cause the muscles that push waste through your intestines to relax.  You may develop hemorrhoids. These are swollen veins (varicose veins) in the rectum that can itch or be painful.  You may develop swollen, bulging veins (varicose veins) in your legs.  You may have increased body aches in the pelvis, back, or thighs. This is due to weight gain and increased hormones that are relaxing your joints.  You may have changes in your hair. These can include thickening of your hair, rapid growth, and changes in texture. Some women also have hair loss during or after pregnancy, or hair that feels dry or thin. Your hair will most likely return to normal after your baby is born.  Your breasts will continue to grow and they will continue to become tender. A yellow fluid (colostrum) may leak from your breasts. This is the first milk you are producing for your baby.  Your belly button may stick out.  You may notice more swelling in your hands,  face, or ankles.  You may have increased tingling or numbness in your hands, arms, and legs. The skin on your belly may also feel numb.  You may feel short of breath because of your expanding uterus.  You may have more problems sleeping. This can be caused by the size of your belly, increased need to urinate, and an increase in your body's metabolism.  You may notice the fetus "dropping," or moving lower in your abdomen (lightening).  You may have increased vaginal discharge.  You may notice your joints feel loose and you may have pain around your pelvic bone.  What to expect at prenatal visits You will have prenatal exams every 2 weeks until week 36. Then you will have weekly prenatal exams. During a routine prenatal visit:  You will be weighed to make sure you and the baby are growing normally.  Your blood pressure will be taken.  Your abdomen will be measured to track your baby's growth.  The fetal heartbeat will be listened to.  Any test results from the previous visit will be discussed.  You may have a cervical check near your due date to see if your cervix has softened or thinned (effaced).  You will be tested for Group B streptococcus. This happens between 35 and 37 weeks.  Your health care provider may ask you:  What your birth plan is.  How you are feeling.  If you are feeling the baby move.  If you have had   any abnormal symptoms, such as leaking fluid, bleeding, severe headaches, or abdominal cramping.  If you are using any tobacco products, including cigarettes, chewing tobacco, and electronic cigarettes.  If you have any questions.  Other tests or screenings that may be performed during your third trimester include:  Blood tests that check for low iron levels (anemia).  Fetal testing to check the health, activity level, and growth of the fetus. Testing is done if you have certain medical conditions or if there are problems during the  pregnancy.  Nonstress test (NST). This test checks the health of your baby to make sure there are no signs of problems, such as the baby not getting enough oxygen. During this test, a belt is placed around your belly. The baby is made to move, and its heart rate is monitored during movement.  What is false labor? False labor is a condition in which you feel small, irregular tightenings of the muscles in the womb (contractions) that usually go away with rest, changing position, or drinking water. These are called Braxton Hicks contractions. Contractions may last for hours, days, or even weeks before true labor sets in. If contractions come at regular intervals, become more frequent, increase in intensity, or become painful, you should see your health care provider. What are the signs of labor?  Abdominal cramps.  Regular contractions that start at 10 minutes apart and become stronger and more frequent with time.  Contractions that start on the top of the uterus and spread down to the lower abdomen and back.  Increased pelvic pressure and dull back pain.  A watery or bloody mucus discharge that comes from the vagina.  Leaking of amniotic fluid. This is also known as your "water breaking." It could be a slow trickle or a gush. Let your health care provider know if it has a color or strange odor. If you have any of these signs, call your health care provider right away, even if it is before your due date. Follow these instructions at home: Medicines  Follow your health care provider's instructions regarding medicine use. Specific medicines may be either safe or unsafe to take during pregnancy.  Take a prenatal vitamin that contains at least 600 micrograms (mcg) of folic acid.  If you develop constipation, try taking a stool softener if your health care provider approves. Eating and drinking  Eat a balanced diet that includes fresh fruits and vegetables, whole grains, good sources of protein  such as meat, eggs, or tofu, and low-fat dairy. Your health care provider will help you determine the amount of weight gain that is right for you.  Avoid raw meat and uncooked cheese. These carry germs that can cause birth defects in the baby.  If you have low calcium intake from food, talk to your health care provider about whether you should take a daily calcium supplement.  Eat four or five small meals rather than three large meals a day.  Limit foods that are high in fat and processed sugars, such as fried and sweet foods.  To prevent constipation: ? Drink enough fluid to keep your urine clear or pale yellow. ? Eat foods that are high in fiber, such as fresh fruits and vegetables, whole grains, and beans. Activity  Exercise only as directed by your health care provider. Most women can continue their usual exercise routine during pregnancy. Try to exercise for 30 minutes at least 5 days a week. Stop exercising if you experience uterine contractions.  Avoid heavy   lifting.  Do not exercise in extreme heat or humidity, or at high altitudes.  Wear low-heel, comfortable shoes.  Practice good posture.  You may continue to have sex unless your health care provider tells you otherwise. Relieving pain and discomfort  Take frequent breaks and rest with your legs elevated if you have leg cramps or low back pain.  Take warm sitz baths to soothe any pain or discomfort caused by hemorrhoids. Use hemorrhoid cream if your health care provider approves.  Wear a good support bra to prevent discomfort from breast tenderness.  If you develop varicose veins: ? Wear support pantyhose or compression stockings as told by your healthcare provider. ? Elevate your feet for 15 minutes, 3-4 times a day. Prenatal care  Write down your questions. Take them to your prenatal visits.  Keep all your prenatal visits as told by your health care provider. This is important. Safety  Wear your seat belt at  all times when driving.  Make a list of emergency phone numbers, including numbers for family, friends, the hospital, and police and fire departments. General instructions  Avoid cat litter boxes and soil used by cats. These carry germs that can cause birth defects in the baby. If you have a cat, ask someone to clean the litter box for you.  Do not travel far distances unless it is absolutely necessary and only with the approval of your health care provider.  Do not use hot tubs, steam rooms, or saunas.  Do not drink alcohol.  Do not use any products that contain nicotine or tobacco, such as cigarettes and e-cigarettes. If you need help quitting, ask your health care provider.  Do not use any medicinal herbs or unprescribed drugs. These chemicals affect the formation and growth of the baby.  Do not douche or use tampons or scented sanitary pads.  Do not cross your legs for long periods of time.  To prepare for the arrival of your baby: ? Take prenatal classes to understand, practice, and ask questions about labor and delivery. ? Make a trial run to the hospital. ? Visit the hospital and tour the maternity area. ? Arrange for maternity or paternity leave through employers. ? Arrange for family and friends to take care of pets while you are in the hospital. ? Purchase a rear-facing car seat and make sure you know how to install it in your car. ? Pack your hospital bag. ? Prepare the baby's nursery. Make sure to remove all pillows and stuffed animals from the baby's crib to prevent suffocation.  Visit your dentist if you have not gone during your pregnancy. Use a soft toothbrush to brush your teeth and be gentle when you floss. Contact a health care provider if:  You are unsure if you are in labor or if your water has broken.  You become dizzy.  You have mild pelvic cramps, pelvic pressure, or nagging pain in your abdominal area.  You have lower back pain.  You have persistent  nausea, vomiting, or diarrhea.  You have an unusual or bad smelling vaginal discharge.  You have pain when you urinate. Get help right away if:  Your water breaks before 37 weeks.  You have regular contractions less than 5 minutes apart before 37 weeks.  You have a fever.  You are leaking fluid from your vagina.  You have spotting or bleeding from your vagina.  You have severe abdominal pain or cramping.  You have rapid weight loss or weight gain.    You have shortness of breath with chest pain.  You notice sudden or extreme swelling of your face, hands, ankles, feet, or legs.  Your baby makes fewer than 10 movements in 2 hours.  You have severe headaches that do not go away when you take medicine.  You have vision changes. Summary  The third trimester is from week 28 through week 40, months 7 through 9. The third trimester is a time when the unborn baby (fetus) is growing rapidly.  During the third trimester, your discomfort may increase as you and your baby continue to gain weight. You may have abdominal, leg, and back pain, sleeping problems, and an increased need to urinate.  During the third trimester your breasts will keep growing and they will continue to become tender. A yellow fluid (colostrum) may leak from your breasts. This is the first milk you are producing for your baby.  False labor is a condition in which you feel small, irregular tightenings of the muscles in the womb (contractions) that eventually go away. These are called Braxton Hicks contractions. Contractions may last for hours, days, or even weeks before true labor sets in.  Signs of labor can include: abdominal cramps; regular contractions that start at 10 minutes apart and become stronger and more frequent with time; watery or bloody mucus discharge that comes from the vagina; increased pelvic pressure and dull back pain; and leaking of amniotic fluid. This information is not intended to replace advice  given to you by your health care provider. Make sure you discuss any questions you have with your health care provider. Document Released: 11/07/2001 Document Revised: 04/20/2016 Document Reviewed: 01/14/2013 Elsevier Interactive Patient Education  2017 [employer]  

## 2017-03-27 ENCOUNTER — Encounter: Payer: Medicaid Other | Admitting: Certified Nurse Midwife

## 2017-04-02 ENCOUNTER — Ambulatory Visit (INDEPENDENT_AMBULATORY_CARE_PROVIDER_SITE_OTHER): Payer: Medicaid Other | Admitting: Certified Nurse Midwife

## 2017-04-02 ENCOUNTER — Other Ambulatory Visit (HOSPITAL_COMMUNITY)
Admission: RE | Admit: 2017-04-02 | Discharge: 2017-04-02 | Disposition: A | Discharge status: Home / Self Care | Payer: Medicaid Other | Source: Ambulatory Visit | Attending: Certified Nurse Midwife | Admitting: Certified Nurse Midwife

## 2017-04-02 ENCOUNTER — Other Ambulatory Visit: Payer: Medicaid Other

## 2017-04-02 VITALS — BP 104/67 | HR 94 | Wt 241.4 lb

## 2017-04-02 DIAGNOSIS — N898 Other specified noninflammatory disorders of vagina: Secondary | ICD-10-CM | POA: Insufficient documentation

## 2017-04-02 DIAGNOSIS — B373 Candidiasis of vulva and vagina: Secondary | ICD-10-CM

## 2017-04-02 DIAGNOSIS — O0993 Supervision of high risk pregnancy, unspecified, third trimester: Secondary | ICD-10-CM

## 2017-04-02 DIAGNOSIS — O0990 Supervision of high risk pregnancy, unspecified, unspecified trimester: Secondary | ICD-10-CM

## 2017-04-02 DIAGNOSIS — B009 Herpesviral infection, unspecified: Secondary | ICD-10-CM

## 2017-04-02 DIAGNOSIS — B3731 Acute candidiasis of vulva and vagina: Secondary | ICD-10-CM

## 2017-04-02 DIAGNOSIS — O09899 Supervision of other high risk pregnancies, unspecified trimester: Secondary | ICD-10-CM

## 2017-04-02 DIAGNOSIS — O09219 Supervision of pregnancy with history of pre-term labor, unspecified trimester: Secondary | ICD-10-CM

## 2017-04-02 DIAGNOSIS — O09213 Supervision of pregnancy with history of pre-term labor, third trimester: Secondary | ICD-10-CM

## 2017-04-02 DIAGNOSIS — O34219 Maternal care for unspecified type scar from previous cesarean delivery: Secondary | ICD-10-CM

## 2017-04-02 LAB — FETAL FIBRONECTIN: FETAL FIBRONECTIN: NEGATIVE

## 2017-04-02 MED ORDER — TERCONAZOLE 0.8 % VA CREA: 1.0000 | TOPICAL_CREAM | Freq: Every day | VAGINAL | 0 refills | Status: DC

## 2017-04-02 MED ORDER — FLUCONAZOLE 150 MG PO TABS
150.0000 mg | ORAL_TABLET | Freq: Once | ORAL | 0 refills | Status: AC
Start: 2017-04-02 — End: 2017-04-02

## 2017-04-02 MED ORDER — VALACYCLOVIR HCL 1 G PO TABS: 500.0000 mg | ORAL_TABLET | Freq: Every day | ORAL | 5 refills | Status: DC

## 2017-04-02 NOTE — Progress Notes (Addendum)
   PRENATAL VISIT NOTE  Subjective:  Connie Jenkins is a 36 y.o. G3P1102 at [redacted]w[redacted]d being seen today for ongoing prenatal care.  She is currently monitored for the following issues for this high-risk pregnancy and has CONDYLOMA ACUMINATUM; Previous cesarean section complicating pregnancy, antepartum condition or complication; Supervision of high risk pregnancy, antepartum; History of preterm delivery, currently pregnant; Herpes simplex virus (HSV) infection; and Migraines on her problem list.  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 Denies any recent sexual intercourse.  Pelvic rest restrictions given, discussed OTC Tylenol, fluids and activity levels.  Contractions: Not present. Vag. Bleeding: None.  Movement: Present. Denies leaking of fluid.   The following portions of the patient's history were reviewed and updated as appropriate: allergies, current medications, past family history, past medical history, past social history, past surgical history and problem list. Problem list updated.  Objective:   Vitals:   04/02/17 0904  BP: 104/67  Pulse: 94  Weight: 241 lb 6.4 oz (109.5 kg)    Fetal Status: Fetal Heart Rate (bpm): 150 Fundal Height: 28 cm Movement: Present  Presentation: Vertex  General:  Alert, oriented and cooperative. Patient is in no acute distress.  Skin: Skin is warm and dry. No rash noted.   Cardiovascular: Normal heart rate noted  Respiratory: Normal respiratory effort, no problems with respiration noted  Abdomen: Soft, gravid, appropriate for gestational age. Pain/Pressure: Absent     Pelvic:  Cervical exam performed Dilation: Closed Effacement (%): 0 Station: Ballotable  Extremities: Normal range of motion.  Edema: Trace  Mental Status: Normal mood and affect. Normal  behavior. Normal judgment and thought content.     Negative Nitrazine    Negative FfN  Assessment and Plan:  Pregnancy: G3P1102 at [redacted]w[redacted]d  1. Supervision of high risk pregnancy, antepartum      - Glucose Tolerance, 2 Hours w/1 Hour - HIV antibody - RPR - CBC - Fetal fibronectin  2. Previous cesarean section complicating pregnancy, antepartum condition or complication     Repeat C-section desired  3. History of preterm delivery, currently pregnant     Not currently on 17-P by patient choice - Fetal fibronectin  4. Vaginal irritation      - Cervicovaginal ancillary only  5. Yeast infection involving the vagina and surrounding area     - fluconazole (DIFLUCAN) 150 MG tablet; Take 1 tablet (150 mg total) by mouth once.  Dispense: 1 tablet; Refill: 0 - terconazole (TERAZOL 3) 0.8 % vaginal cream; Place 1 applicator vaginally at bedtime.  Dispense: 20 g; Refill: 0  6. Herpes simplex virus (HSV) infection     Hx of HSV, states that she has been stressed d/t financial stressors.  - valACYclovir (VALTREX) 1000 MG tablet; Take 0.5 tablets (500 mg total) by mouth daily.  Dispense: 30 tablet; Refill: 5  Preterm labor symptoms and general obstetric precautions including but not limited to vaginal bleeding, contractions, leaking of fluid and fetal movement were reviewed in detail with the patient. Please refer to After Visit Summary for other counseling recommendations.  Return in about 2 weeks (around 04/16/2017) for [facility], Needs to see FP MD here.   Morene Crocker, CNM

## 2017-04-02 NOTE — Progress Notes (Signed)
Patient is in the office, reports good fetal movement, denies pain/contractions. Patient states that she felt one contraction earlier this morning, she states that she previously went to MAU due to having irregular contractions.

## 2017-04-03 LAB — CBC
HEMATOCRIT: 31.9 % — AB (ref 34.0–46.6)
HEMOGLOBIN: 10.6 g/dL — AB (ref 11.1–15.9)
MCH: 27.2 pg (ref 26.6–33.0)
MCHC: 33.2 g/dL (ref 31.5–35.7)
MCV: 82 fL (ref 79–97)
Platelets: 273 10*3/uL (ref 150–379)
RBC: 3.9 x10E6/uL (ref 3.77–5.28)
RDW: 13.9 % (ref 12.3–15.4)
WBC: 9.5 10*3/uL (ref 3.4–10.8)

## 2017-04-03 LAB — HIV ANTIBODY (ROUTINE TESTING W REFLEX): HIV SCREEN 4TH GENERATION: NONREACTIVE

## 2017-04-03 LAB — GLUCOSE TOLERANCE, 2 HOURS W/ 1HR
GLUCOSE, FASTING: 98 mg/dL — AB (ref 65–91)
Glucose, 1 hour: 211 mg/dL — ABNORMAL HIGH (ref 65–179)
Glucose, 2 hour: 146 mg/dL (ref 65–152)

## 2017-04-03 LAB — CERVICOVAGINAL ANCILLARY ONLY
BACTERIAL VAGINITIS: NEGATIVE
CANDIDA VAGINITIS: POSITIVE — AB

## 2017-04-03 LAB — RPR: RPR: NONREACTIVE

## 2017-04-08 ENCOUNTER — Other Ambulatory Visit: Payer: Self-pay | Admitting: Certified Nurse Midwife

## 2017-04-08 DIAGNOSIS — O24419 Gestational diabetes mellitus in pregnancy, unspecified control: Secondary | ICD-10-CM | POA: Insufficient documentation

## 2017-04-08 DIAGNOSIS — O09899 Supervision of other high risk pregnancies, unspecified trimester: Secondary | ICD-10-CM

## 2017-04-08 DIAGNOSIS — O09219 Supervision of pregnancy with history of pre-term labor, unspecified trimester: Secondary | ICD-10-CM

## 2017-04-08 DIAGNOSIS — O0990 Supervision of high risk pregnancy, unspecified, unspecified trimester: Secondary | ICD-10-CM

## 2017-04-09 ENCOUNTER — Other Ambulatory Visit: Payer: Self-pay | Admitting: *Deleted

## 2017-04-09 DIAGNOSIS — O24419 Gestational diabetes mellitus in pregnancy, unspecified control: Secondary | ICD-10-CM

## 2017-04-09 MED ORDER — ACCU-CHEK GUIDE W/DEVICE KIT: 1.0000 | PACK | Freq: Once | 0 refills | Status: AC

## 2017-04-09 MED ORDER — GLUCOSE BLOOD VI STRP: ORAL_STRIP | 5 refills | Status: DC

## 2017-04-09 MED ORDER — ACCU-CHEK FASTCLIX LANCETS MISC: 5 refills | Status: DC

## 2017-04-09 NOTE — Progress Notes (Signed)
Diabetic testing supplies sent to pharmacy. See lab note.

## 2017-04-10 ENCOUNTER — Inpatient Hospital Stay (HOSPITAL_COMMUNITY): Payer: Medicaid Other

## 2017-04-10 ENCOUNTER — Encounter (HOSPITAL_COMMUNITY): Payer: Self-pay | Admitting: *Deleted

## 2017-04-10 ENCOUNTER — Inpatient Hospital Stay (HOSPITAL_COMMUNITY)
Admission: AD | Admit: 2017-04-10 | Discharge: 2017-04-10 | Disposition: A | Discharge status: Home / Self Care | Payer: Medicaid Other | Source: Ambulatory Visit | Attending: Obstetrics and Gynecology | Admitting: Obstetrics and Gynecology

## 2017-04-10 DIAGNOSIS — J45909 Unspecified asthma, uncomplicated: Secondary | ICD-10-CM | POA: Insufficient documentation

## 2017-04-10 DIAGNOSIS — K589 Irritable bowel syndrome without diarrhea: Secondary | ICD-10-CM | POA: Diagnosis not present

## 2017-04-10 DIAGNOSIS — Z91040 Latex allergy status: Secondary | ICD-10-CM | POA: Diagnosis not present

## 2017-04-10 DIAGNOSIS — O0990 Supervision of high risk pregnancy, unspecified, unspecified trimester: Secondary | ICD-10-CM

## 2017-04-10 DIAGNOSIS — O4693 Antepartum hemorrhage, unspecified, third trimester: Secondary | ICD-10-CM | POA: Insufficient documentation

## 2017-04-10 DIAGNOSIS — Z833 Family history of diabetes mellitus: Secondary | ICD-10-CM | POA: Diagnosis not present

## 2017-04-10 DIAGNOSIS — Z880 Allergy status to penicillin: Secondary | ICD-10-CM | POA: Insufficient documentation

## 2017-04-10 DIAGNOSIS — Z87442 Personal history of urinary calculi: Secondary | ICD-10-CM | POA: Insufficient documentation

## 2017-04-10 DIAGNOSIS — Z9889 Other specified postprocedural states: Secondary | ICD-10-CM | POA: Insufficient documentation

## 2017-04-10 DIAGNOSIS — O4692 Antepartum hemorrhage, unspecified, second trimester: Secondary | ICD-10-CM

## 2017-04-10 DIAGNOSIS — O4703 False labor before 37 completed weeks of gestation, third trimester: Secondary | ICD-10-CM | POA: Insufficient documentation

## 2017-04-10 DIAGNOSIS — O09899 Supervision of other high risk pregnancies, unspecified trimester: Secondary | ICD-10-CM

## 2017-04-10 DIAGNOSIS — O99613 Diseases of the digestive system complicating pregnancy, third trimester: Secondary | ICD-10-CM | POA: Diagnosis not present

## 2017-04-10 DIAGNOSIS — O99513 Diseases of the respiratory system complicating pregnancy, third trimester: Secondary | ICD-10-CM | POA: Diagnosis not present

## 2017-04-10 DIAGNOSIS — Z8619 Personal history of other infectious and parasitic diseases: Secondary | ICD-10-CM | POA: Diagnosis not present

## 2017-04-10 DIAGNOSIS — O34219 Maternal care for unspecified type scar from previous cesarean delivery: Secondary | ICD-10-CM

## 2017-04-10 DIAGNOSIS — Z8249 Family history of ischemic heart disease and other diseases of the circulatory system: Secondary | ICD-10-CM | POA: Diagnosis not present

## 2017-04-10 DIAGNOSIS — Z3A29 29 weeks gestation of pregnancy: Secondary | ICD-10-CM | POA: Diagnosis not present

## 2017-04-10 DIAGNOSIS — O09219 Supervision of pregnancy with history of pre-term labor, unspecified trimester: Secondary | ICD-10-CM

## 2017-04-10 DIAGNOSIS — Z881 Allergy status to other antibiotic agents status: Secondary | ICD-10-CM | POA: Diagnosis not present

## 2017-04-10 DIAGNOSIS — R109 Unspecified abdominal pain: Secondary | ICD-10-CM | POA: Diagnosis present

## 2017-04-10 LAB — URINALYSIS, ROUTINE W REFLEX MICROSCOPIC
Bilirubin Urine: NEGATIVE
GLUCOSE, UA: NEGATIVE mg/dL
Ketones, ur: 5 mg/dL — AB
Leukocytes, UA: NEGATIVE
NITRITE: NEGATIVE
PH: 5 (ref 5.0–8.0)
Protein, ur: 30 mg/dL — AB
Specific Gravity, Urine: 1.029 (ref 1.005–1.030)

## 2017-04-10 NOTE — MAU Provider Note (Signed)
History     CSN: [id number]  Arrival date and time: 04/10/17 1825   First Provider Initiated Contact with Patient 04/10/17 1921      Chief Complaint  Patient presents with  . Vaginal Bleeding  . Abdominal Pain   HPI Connie Jenkins is a 36 y.o. [phone] at [redacted]w[redacted]d who presents to MAU today with complaint of increased vaginal bleeding and contractions. The patient states that she has had some bleeding off and on during the pregnancy and is on "bedrest" because of that. She has a history of a 34 week delivery. She was on 17-p and chose to discontinue. She states that bleeding has been spotting prior to today. She was also recently treated for a yeast infection. She states contractions have been frequent at night. She notes no more than 6/hour in the evenings, but felt they were more painful today. She reports good fetal movement.   OB History    Gravida Para Term Preterm AB Living   3 2 1 1  0 2   SAB TAB Ectopic Multiple Live Births   0       2      Past Medical History:  Diagnosis Date  . Anxiety    no meds  . Asthma    inhaler rarely used - twice year  . Depression    no current Tx.  . Febrile illness, acute 01/2011   Hospitalized for 6 days  . Headache   . Herpes 2008  . Hx of bronchitis 09/2012  . IBS (irritable bowel syndrome)   . Renal stone 08/26/2014  . Seasonal allergies     Past Surgical History:  Procedure Laterality Date  . CESAREAN SECTION  2000 & 2004   "1st one I dilated 1cm, then the 2nd one was a rpt  . CYSTOSCOPY WITH RETROGRADE PYELOGRAM, URETEROSCOPY AND STENT PLACEMENT Left 08/26/2014   Procedure: CYSTOSCOPY WITH RETROGRADE PYELOGRAM, URETEROSCOPY, LASER, STONE EXTRACTION WITH BASKET;  Surgeon: Raynelle Bring, MD;  Location: [facility];  Service: Urology;  Laterality: Left;  . HERNIA REPAIR    . HYSTEROSCOPY N/A 03/24/2013   Procedure: HYSTEROSCOPY / Dilitation and curettage/endometrial curettings;  Surgeon: Lavonia Drafts, MD;  Location: [facility];   Service: Gynecology;  Laterality: N/A;  Diagnostic hysteroscopy  . TMJ ARTHROPLASTY    . WISDOM TOOTH EXTRACTION      Family History  Problem Relation Age of Onset  . Hypertension Mother   . Diabetes Mother     Social History  Substance Use Topics  . Smoking status: Never Smoker  . Smokeless tobacco: Never Used  . Alcohol use No     Comment: before +UPT     Allergies:  Allergies  Allergen Reactions  . Flagyl [Metronidazole] Nausea And Vomiting  . Latex Itching  . Penicillins Hives and Other (See Comments)    Childhood allergy Has patient had a PCN reaction causing immediate rash, facial/tongue/throat swelling, SOB or lightheadedness with hypotension: unknown Has patient had a PCN reaction causing severe rash involving mucus membranes or skin necrosis: unknown Has patient had a PCN reaction that required hospitalization unknown Has patient had a PCN reaction occurring within the last 10 years: no If all of the above answers are "NO", then may proceed with Cephalosporin use.      Facility-Administered Medications Prior to Admission  Medication Dose Route Frequency Provider Last Rate Last Dose  . hydroxyprogesterone caproate (MAKENA) 250 mg/mL injection 250 mg  250 mg Intramuscular Weekly Constant, Peggy, MD  250 mg at 01/24/17 1029   Prescriptions Prior to Admission  Medication Sig Dispense Refill Last Dose  . ACCU-CHEK FASTCLIX LANCETS MISC Use one lancet to check blood glucose 4 times daily 100 each 5   . acetaminophen (TYLENOL) 500 MG tablet Take 500 mg by mouth every 6 (six) hours as needed for headache.   Not Taking  . clindamycin (CLEOCIN) 300 MG capsule Take 1 capsule (300 mg total) by mouth 2 (two) times daily. For seven days (Patient not taking: Reported on 04/02/2017) 14 capsule 0 Not Taking  . glucose blood (ACCU-CHEK GUIDE) test strip Use one test strip to check blood glucose 4 times daily 100 each 5   . terconazole (TERAZOL 3) 0.8 % vaginal cream Place 1  applicator vaginally at bedtime. 20 g 0   . valACYclovir (VALTREX) 1000 MG tablet Take 0.5 tablets (500 mg total) by mouth daily. 30 tablet 5     Review of Systems  Constitutional: Negative for fever.  Gastrointestinal: Positive for abdominal pain. Negative for nausea and vomiting.  Genitourinary: Positive for pelvic pain, vaginal bleeding and vaginal discharge.   Physical Exam   Last menstrual period 09/15/2016.  Physical Exam  Nursing note and vitals reviewed. Constitutional: She is oriented to person, place, and time. She appears well-developed and well-nourished. No distress.  HENT:  Head: Normocephalic and atraumatic.  Cardiovascular: Normal rate.   Respiratory: Effort normal.  GI: Soft. She exhibits no distension and no mass. There is no tenderness. There is no rebound and no guarding.  Genitourinary: Uterus is enlarged (gravid). Uterus is not tender. Cervix exhibits no motion tenderness and no friability. No bleeding in the vagina. Vaginal discharge (small amount of thin, white discharge noted) found.  Neurological: She is alert and oriented to person, place, and time.  Skin: Skin is warm and dry. No erythema.  Psychiatric: She has a normal mood and affect.  Dilation: Closed Effacement (%): Thick Cervical Position: Posterior Exam by:: Noell Lorensen,PA   Results for orders placed or performed during the hospital encounter of 04/10/17 (from the past 24 hour(s))  Urinalysis, Routine w reflex microscopic     Status: Abnormal   Collection Time: 04/10/17  6:30 PM  Result Value Ref Range   Color, Urine YELLOW YELLOW   APPearance HAZY (A) CLEAR   Specific Gravity, Urine 1.029 1.005 - 1.030   pH 5.0 5.0 - 8.0   Glucose, UA NEGATIVE NEGATIVE mg/dL   Hgb urine dipstick MODERATE (A) NEGATIVE   Bilirubin Urine NEGATIVE NEGATIVE   Ketones, ur 5 (A) NEGATIVE mg/dL   Protein, ur 30 (A) NEGATIVE mg/dL   Nitrite NEGATIVE NEGATIVE   Leukocytes, UA NEGATIVE NEGATIVE   RBC / HPF TOO NUMEROUS  TO COUNT 0 - 5 RBC/hpf   WBC, UA 6-30 0 - 5 WBC/hpf   Bacteria, UA RARE (A) NONE SEEN   Squamous Epithelial / LPF 6-30 (A) NONE SEEN   Mucous PRESENT    Fetal Monitoring: Baseline: 130 bpm Variability: moderate Accelerations: 10 x 10 Decelerations: none Contractions: none  MAU Course  Procedures None  MDM UA today  [place] today- preliminary US shows no evidence of previa or abruption.  Discussed patient with Dr. Elly Modena. Agrees with plan to continued modified bedrest and monitor for additional bleeding episodes at home. [place] for discharge today.   Assessment and Plan  A: [facility] at [redacted]w[redacted]d Vaginal bleeding in pregnancy, third trimester Braxton hicks contractions   P: Discharge home Bleeding/preterm labor precautions discussed Patient advised to follow-up  with [facility] as scheduled for routine prenatal care or sooner if symptoms worsen Patient may return to MAU as needed or if her condition were to change or worsen   Kerry Hough, PA-C 04/10/2017, 8:17 PM

## 2017-04-10 NOTE — Discharge Instructions (Signed)
Fetal Movement Counts Patient Name: ________________________________________________ Patient Due Date: ____________________ What is a fetal movement count? A fetal movement count is the number of times that you feel your baby move during a certain amount of time. This may also be called a fetal kick count. A fetal movement count is recommended for every pregnant woman. You may be asked to start counting fetal movements as early as week 28 of your pregnancy. Pay attention to when your baby is most active. You may notice your baby's sleep and wake cycles. You may also notice things that make your baby move more. You should do a fetal movement count:  When your baby is normally most active.  At the same time each day. A good time to count movements is while you are resting, after having something to eat and drink. How do I count fetal movements? 1. Find a quiet, comfortable area. Sit, or lie down on your side. 2. Write down the date, the start time and stop time, and the number of movements that you felt between those two times. Take this information with you to your health care visits. 3. For 2 hours, count kicks, flutters, swishes, rolls, and jabs. You should feel at least 10 movements during 2 hours. 4. You may stop counting after you have felt 10 movements. 5. If you do not feel 10 movements in 2 hours, have something to eat and drink. Then, keep resting and counting for 1 hour. If you feel at least 4 movements during that hour, you may stop counting. Contact a health care provider if:  You feel fewer than 4 movements in 2 hours.  Your baby is not moving like he or she usually does. Date: ____________ Start time: ____________ Stop time: ____________ Movements: ____________ Date: ____________ Start time: ____________ Stop time: ____________ Movements: ____________ Date: ____________ Start time: ____________ Stop time: ____________ Movements: ____________ Date: ____________ Start time:  ____________ Stop time: ____________ Movements: ____________ Date: ____________ Start time: ____________ Stop time: ____________ Movements: ____________ Date: ____________ Start time: ____________ Stop time: ____________ Movements: ____________ Date: ____________ Start time: ____________ Stop time: ____________ Movements: ____________ Date: ____________ Start time: ____________ Stop time: ____________ Movements: ____________ Date: ____________ Start time: ____________ Stop time: ____________ Movements: ____________ This information is not intended to replace advice given to you by your health care provider. Make sure you discuss any questions you have with your health care provider. Document Released: 12/13/2006 Document Revised: 07/12/2016 Document Reviewed: 12/23/2015 Elsevier Interactive Patient Education  2017 [employer] Braxton Hicks Contractions Contractions of the uterus can occur throughout pregnancy, but they are not always a sign that you are in labor. You may have practice contractions called Braxton Hicks contractions. These false labor contractions are sometimes confused with true labor. What are Braxton Hicks contractions? Braxton Hicks contractions are tightening movements that occur in the muscles of the uterus before labor. Unlike true labor contractions, these contractions do not result in opening (dilation) and thinning of the cervix. Toward the end of pregnancy (32-34 weeks), Braxton Hicks contractions can happen more often and may become stronger. These contractions are sometimes difficult to tell apart from true labor because they can be very uncomfortable. You should not feel embarrassed if you go to the hospital with false labor. Sometimes, the only way to tell if you are in true labor is for your health care provider to look for changes in the cervix. The health care provider will do a physical exam and may monitor your contractions. If you   are not in true labor, the exam  should show that your cervix is not dilating and your water has not broken. If there are no prenatal problems or other health problems associated with your pregnancy, it is completely safe for you to be sent home with false labor. You may continue to have Braxton Hicks contractions until you go into true labor. How can I tell the difference between true labor and false labor?  Differences  False labor  Contractions last 30-70 seconds.: Contractions are usually shorter and not as strong as true labor contractions.  Contractions become very regular.: Contractions are usually irregular.  Discomfort is usually felt in the top of the uterus, and it spreads to the lower abdomen and low back.: Contractions are often felt in the front of the lower abdomen and in the groin.  Contractions do not go away with walking.: Contractions may go away when you walk around or change positions while lying down.  Contractions usually become more intense and increase in frequency.: Contractions get weaker and are shorter-lasting as time goes on.  The cervix dilates and gets thinner.: The cervix usually does not dilate or become thin. Follow these instructions at home:  Take over-the-counter and prescription medicines only as told by your health care provider.  Keep up with your usual exercises and follow other instructions from your health care provider.  Eat and drink lightly if you think you are going into labor.  If Braxton Hicks contractions are making you uncomfortable:  Change your position from lying down or resting to walking, or change from walking to resting.  Sit and rest in a tub of warm water.  Drink enough fluid to keep your urine clear or pale yellow. Dehydration may cause these contractions.  Do slow and deep breathing several times an hour.  Keep all follow-up prenatal visits as told by your health care provider. This is important. Contact a health care provider if:  You have a  fever.  You have continuous pain in your abdomen. Get help right away if:  Your contractions become stronger, more regular, and closer together.  You have fluid leaking or gushing from your vagina.  You pass blood-tinged mucus (bloody show).  You have bleeding from your vagina.  You have low back pain that you never had before.  You feel your baby's head pushing down and causing pelvic pressure.  Your baby is not moving inside you as much as it used to. Summary  Contractions that occur before labor are called Braxton Hicks contractions, false labor, or practice contractions.  Braxton Hicks contractions are usually shorter, weaker, farther apart, and less regular than true labor contractions. True labor contractions usually become progressively stronger and regular and they become more frequent.  Manage discomfort from Braxton Hicks contractions by changing position, resting in a warm bath, drinking plenty of water, or practicing deep breathing. This information is not intended to replace advice given to you by your health care provider. Make sure you discuss any questions you have with your health care provider. Document Released: 11/13/2005 Document Revised: 10/02/2016 Document Reviewed: 10/02/2016 Elsevier Interactive Patient Education  2017 Elsevier Inc.  

## 2017-04-10 NOTE — MAU Note (Signed)
Pt reports she had a lot of contractions last night and this am. States it has eased off to just cramping. States she has had some bleeding off/on throughout the pregnancy but it had increased today and has filled a pad. Reports good fetal movement.

## 2017-04-16 ENCOUNTER — Ambulatory Visit (INDEPENDENT_AMBULATORY_CARE_PROVIDER_SITE_OTHER): Payer: Medicaid Other | Admitting: Obstetrics and Gynecology

## 2017-04-16 VITALS — BP 118/66 | HR 97 | Wt 239.7 lb

## 2017-04-16 DIAGNOSIS — O34219 Maternal care for unspecified type scar from previous cesarean delivery: Secondary | ICD-10-CM

## 2017-04-16 DIAGNOSIS — O0993 Supervision of high risk pregnancy, unspecified, third trimester: Secondary | ICD-10-CM | POA: Diagnosis not present

## 2017-04-16 DIAGNOSIS — Z3009 Encounter for other general counseling and advice on contraception: Secondary | ICD-10-CM | POA: Insufficient documentation

## 2017-04-16 DIAGNOSIS — O09213 Supervision of pregnancy with history of pre-term labor, third trimester: Secondary | ICD-10-CM

## 2017-04-16 DIAGNOSIS — O24419 Gestational diabetes mellitus in pregnancy, unspecified control: Secondary | ICD-10-CM | POA: Diagnosis not present

## 2017-04-16 DIAGNOSIS — O09219 Supervision of pregnancy with history of pre-term labor, unspecified trimester: Secondary | ICD-10-CM

## 2017-04-16 DIAGNOSIS — O09899 Supervision of other high risk pregnancies, unspecified trimester: Secondary | ICD-10-CM

## 2017-04-16 DIAGNOSIS — O0990 Supervision of high risk pregnancy, unspecified, unspecified trimester: Secondary | ICD-10-CM

## 2017-04-16 NOTE — Progress Notes (Signed)
Subjective:  Connie Jenkins is a 36 y.o. [phone] at [redacted]w[redacted]d being seen today for ongoing prenatal care.  She is currently monitored for the following issues for this high-risk pregnancy and has CONDYLOMA ACUMINATUM; Previous cesarean section complicating pregnancy, antepartum condition or complication; Supervision of high risk pregnancy, antepartum; History of preterm delivery, currently pregnant; Herpes simplex virus (HSV) infection; Migraines; Gestational diabetes mellitus (GDM) affecting pregnancy; and Unwanted fertility on her problem list.  Patient reports still having some spotting at times. Occ BH..  Contractions: Irregular. Vag. Bleeding: Scant.  Movement: Present. Denies leaking of fluid.   The following portions of the patient's history were reviewed and updated as appropriate: allergies, current medications, past family history, past medical history, past social history, past surgical history and problem list. Problem list updated.  Objective:   Vitals:   04/16/17 1044  BP: 118/66  Pulse: 97  Weight: 239 lb 11.2 oz (108.7 kg)    Fetal Status:     Movement: Present     General:  Alert, oriented and cooperative. Patient is in no acute distress.  Skin: Skin is warm and dry. No rash noted.   Cardiovascular: Normal heart rate noted  Respiratory: Normal respiratory effort, no problems with respiration noted  Abdomen: Soft, gravid, appropriate for gestational age. Pain/Pressure: Present     Pelvic:  Cervical exam performed      No bleeding noted  Extremities: Normal range of motion.  Edema: Trace  Mental Status: Normal mood and affect. Normal behavior. Normal judgment and thought content.   Urinalysis:      Assessment and Plan:  Pregnancy: G3P1102 at [redacted]w[redacted]d  1. Supervision of high risk pregnancy, antepartum Stable  2. Gestational diabetes mellitus (GDM) affecting pregnancy Has appt with Diabetic educator this week GDM reviewed with pt. Pt information provided Not checking BS  regularly, stressed importance of checking and BS goals Pt instructed to call next week with BS readings, will hold on starting meds at this point Pt informed if BS not in goal range medication would be recommended  3. History of preterm delivery, currently pregnant Pt has stopped 17-OP   4. Previous cesarean section complicating pregnancy, antepartum condition or complication Will need repeat at 39 weeks  5. Unwanted fertility BTL papers signed today  Preterm labor symptoms and general obstetric precautions including but not limited to vaginal bleeding, contractions, leaking of fluid and fetal movement were reviewed in detail with the patient. Please refer to After Visit Summary for other counseling recommendations.  Return in about 2 weeks (around 04/30/2017) for OB visit.   Chancy Milroy, MD

## 2017-04-16 NOTE — Progress Notes (Signed)
Patient reports good fetal movement and contractions that come and go. Pt reports light spotting today, and that occurs when she has been really active.

## 2017-04-16 NOTE — Patient Instructions (Signed)
Gestational Diabetes Mellitus, Diagnosis Gestational diabetes (gestational diabetes mellitus) is a temporary form of diabetes that some women develop during pregnancy. It usually occurs around weeks 24-28 of pregnancy and goes away after delivery. Hormonal changes during pregnancy can interfere with insulin production and function, which may result in one or both of these problems:  The pancreas does not make enough of a hormone called insulin.  Cells in the body do not respond properly to insulin that the body makes (insulin resistance).  Normally, insulin allows sugars (glucose) to enter cells in the body. The cells use glucose for energy. Insulin resistance or lack of insulin causes excess glucose to build up in the blood instead of going into cells. As a result, high blood glucose (hyperglycemia) develops. What are the risks? If gestational diabetes is treated, it is unlikely to cause problems. If it is not controlled with treatment, it may cause problems during labor and delivery, and some of those problems can be harmful to the unborn baby (fetus) and the mother. Uncontrolled gestational diabetes may also cause the newborn baby to have breathing problems and low blood glucose. Women who get gestational diabetes are more likely to develop it if they get pregnant again, and they are more likely to develop type 2 diabetes in the future. What increases the risk? This condition may be more likely to develop in pregnant women who:  Are older than age 25 during pregnancy.  Have a family history of diabetes.  Are overweight.  Had gestational diabetes in the past.  Have polycystic ovarian syndrome (PCOS).  Are pregnant with twins or multiples.  Are of American-Indian, African-American, Hispanic/Latino, or Asian/Pacific Islander descent.  What are the signs or symptoms? Most women do not notice symptoms of gestational diabetes because the symptoms are similar to normal symptoms of pregnancy.  Symptoms of gestational diabetes may include:  Increased thirst (polydipsia).  Increased hunger(polyphagia).  Increased urination (polyuria).  How is this diagnosed?  This condition may be diagnosed based on your blood glucose level, which may be checked with one or more of the following blood tests:  A fasting blood glucose (FBG) test. You will not be allowed to eat (you will fast) for at least 8 hours before a blood sample is taken.  A random blood glucose test. This checks your blood glucose at any time of day regardless of when you ate.  An oral glucose tolerance test (OGTT). This is usually done during weeks 24-28 of pregnancy. ? For this test, you will have an FBG test done. Then, you will drink a beverage that contains glucose. Your blood glucose will be tested again 1 hour after drinking the glucose beverage (1-hour OGTT). ? If the 1-hour OGTT result is at or above 140 mg/dL (7.8 mmol/L), you will repeat the OGTT. This time, your blood glucose will be tested 3 hours after drinking the glucose beverage (3-hour OGTT).  If you have risk factors, you may be screened for undiagnosed type 2 diabetes at your first health care visit during your pregnancy (prenatal visit). How is this treated?  Your treatment may be managed by a specialist called an endocrinologist. This condition is treated by following instructions from your health care provider about:  Eating a healthier diet and getting more physical activity. These changes are the most important ways to manage gestational diabetes.  Checking your blood glucose. Do this as often as told.  Taking diabetes medicines or insulin every day. These will only be prescribed if they are   needed. ? If you use insulin, you may need to adjust your dosage based on how physically active you are and what foods you eat. Your health care provider will tell you how to do this.  Your health care provider will set treatment goals for you based on the  stage of your pregnancy and any other medical conditions you have. Generally, the goal of treatment is to maintain the following blood glucose levels during pregnancy:  Fasting: at or below 95 mg/dL (5.3 mmol/L).  After meals (postprandial): ? One hour after a meal: at or below 140 mg/dL (7.8 mmol/L). ? Two hours after a meal: at or below 120 mg/dL (6.7 mmol/L).  A1c (hemoglobin A1c) level: 6-6.5%.  Follow these instructions at home:  Take over-the-counter and prescription medicines only as told by your health care provider.  Manage your weight gain during pregnancy. The amount of weight that you are expected to gain depends on your pre-pregnancy BMI (body mass index).  Keep all follow-up visits as told by your health care provider. This is important. Consider asking your health care provider these questions:   Do I need to meet with a diabetes educator?  Where can I find a support group for people with diabetes?  What equipment will I need to manage my diabetes at home?  What diabetes medicines do I need, and when should I take them?  How often do I need to check my blood glucose?  What number can I call if I have questions?  When is my next appointment? Where to find more information:  For more information about diabetes, visit: ? American Diabetes Association (ADA): www.diabetes.org ? American Association of Diabetes Educators (AADE): www.diabeteseducator.org/patient-resources Contact a health care provider if:  Your blood glucose level is at or above 240 mg/dL (13.3 mmol/L).  Your blood glucose level is at or above 200 mg/dL (11.1 mmol/L) and you have ketones in your urine.  You have been sick or have had a fever for 2 days or more and you are not getting better.  You have any of the following problems for more than 6 hours: ? You cannot eat or drink. ? You have nausea and vomiting. ? You have diarrhea. Get help right away if:  Your blood glucose is below 54  mg/dL (3 mmol/L).  You become confused or you have trouble thinking clearly.  You have difficulty breathing.  You have moderate or large ketone levels in your urine.  Your baby is moving around less than usual.  You develop unusual discharge or bleeding from your vagina.  You start having contractions early (prematurely). Contractions may feel like a tightening in your lower abdomen. This information is not intended to replace advice given to you by your health care provider. Make sure you discuss any questions you have with your health care provider. Document Released: 02/19/2001 Document Revised: 04/20/2016 Document Reviewed: 12/17/2015 Elsevier Interactive Patient Education  2017 [employer] Inc.  

## 2017-04-18 ENCOUNTER — Encounter: Payer: Self-pay | Admitting: Registered"

## 2017-04-18 ENCOUNTER — Encounter: Payer: Medicaid Other | Attending: Certified Nurse Midwife | Admitting: Registered"

## 2017-04-18 DIAGNOSIS — Z3A00 Weeks of gestation of pregnancy not specified: Secondary | ICD-10-CM | POA: Diagnosis not present

## 2017-04-18 DIAGNOSIS — O09219 Supervision of pregnancy with history of pre-term labor, unspecified trimester: Secondary | ICD-10-CM | POA: Diagnosis not present

## 2017-04-18 DIAGNOSIS — O24419 Gestational diabetes mellitus in pregnancy, unspecified control: Secondary | ICD-10-CM | POA: Diagnosis present

## 2017-04-18 DIAGNOSIS — O0990 Supervision of high risk pregnancy, unspecified, unspecified trimester: Secondary | ICD-10-CM | POA: Insufficient documentation

## 2017-04-18 DIAGNOSIS — R7309 Other abnormal glucose: Secondary | ICD-10-CM

## 2017-04-18 NOTE — Progress Notes (Signed)
Patient was seen on 04/18/2017 for Gestational Diabetes self-management class at the [facility]. The following learning objectives were met by the patient during this course:   States the definition of Gestational Diabetes  States why dietary management is important in controlling blood glucose  Describes the effects each nutrient has on blood glucose levels  Demonstrates ability to create a balanced meal plan  Demonstrates carbohydrate counting   States when to check blood glucose levels  Demonstrates proper blood glucose monitoring techniques  States the effect of stress and exercise on blood glucose levels  States the importance of limiting caffeine and abstaining from alcohol and smoking  Blood glucose monitor given: pt has own meter Lot # n/a Exp: n/a Blood glucose reading: 107  Patient instructed to monitor glucose levels: FBS: 60 - <90 1 hour: <140 2 hour: <120  Patient received handouts:  Nutrition Diabetes and Pregnancy  Carbohydrate Counting List  Patient will be seen for follow-up as needed. 

## 2017-04-24 ENCOUNTER — Encounter (HOSPITAL_COMMUNITY): Payer: Self-pay

## 2017-04-24 ENCOUNTER — Inpatient Hospital Stay (HOSPITAL_COMMUNITY)
Admission: AD | Admit: 2017-04-24 | Discharge: 2017-04-24 | Disposition: A | Discharge status: Home / Self Care | Payer: Medicaid Other | Source: Ambulatory Visit | Attending: Family Medicine | Admitting: Family Medicine

## 2017-04-24 DIAGNOSIS — R0602 Shortness of breath: Secondary | ICD-10-CM | POA: Diagnosis not present

## 2017-04-24 DIAGNOSIS — D649 Anemia, unspecified: Secondary | ICD-10-CM | POA: Diagnosis not present

## 2017-04-24 DIAGNOSIS — Z91040 Latex allergy status: Secondary | ICD-10-CM | POA: Diagnosis not present

## 2017-04-24 DIAGNOSIS — Z3A31 31 weeks gestation of pregnancy: Secondary | ICD-10-CM | POA: Diagnosis not present

## 2017-04-24 DIAGNOSIS — O9989 Other specified diseases and conditions complicating pregnancy, childbirth and the puerperium: Secondary | ICD-10-CM

## 2017-04-24 DIAGNOSIS — Z883 Allergy status to other anti-infective agents status: Secondary | ICD-10-CM | POA: Insufficient documentation

## 2017-04-24 DIAGNOSIS — O26893 Other specified pregnancy related conditions, third trimester: Secondary | ICD-10-CM | POA: Insufficient documentation

## 2017-04-24 DIAGNOSIS — Z87442 Personal history of urinary calculi: Secondary | ICD-10-CM | POA: Diagnosis not present

## 2017-04-24 DIAGNOSIS — O99013 Anemia complicating pregnancy, third trimester: Secondary | ICD-10-CM | POA: Diagnosis not present

## 2017-04-24 DIAGNOSIS — Z880 Allergy status to penicillin: Secondary | ICD-10-CM | POA: Insufficient documentation

## 2017-04-24 DIAGNOSIS — Z8249 Family history of ischemic heart disease and other diseases of the circulatory system: Secondary | ICD-10-CM | POA: Insufficient documentation

## 2017-04-24 DIAGNOSIS — Z833 Family history of diabetes mellitus: Secondary | ICD-10-CM | POA: Diagnosis not present

## 2017-04-24 LAB — COMPREHENSIVE METABOLIC PANEL
ALBUMIN: 2.9 g/dL — AB (ref 3.5–5.0)
ALK PHOS: 83 U/L (ref 38–126)
ALT: 23 U/L (ref 14–54)
ANION GAP: 6 (ref 5–15)
AST: 34 U/L (ref 15–41)
BUN: 10 mg/dL (ref 6–20)
CALCIUM: 9 mg/dL (ref 8.9–10.3)
CO2: 23 mmol/L (ref 22–32)
Chloride: 107 mmol/L (ref 101–111)
Creatinine, Ser: 0.59 mg/dL (ref 0.44–1.00)
GFR calc non Af Amer: >60 mL/min (ref >60)
GLUCOSE: 111 mg/dL — AB (ref 65–99)
POTASSIUM: 3.5 mmol/L (ref 3.5–5.1)
Sodium: 136 mmol/L (ref 135–145)
Total Bilirubin: 0.5 mg/dL (ref 0.3–1.2)
Total Protein: 6.7 g/dL (ref 6.5–8.1)

## 2017-04-24 LAB — URINALYSIS, ROUTINE W REFLEX MICROSCOPIC
Bilirubin Urine: NEGATIVE
GLUCOSE, UA: 50 mg/dL — AB
Hgb urine dipstick: NEGATIVE
Ketones, ur: NEGATIVE mg/dL
LEUKOCYTES UA: NEGATIVE
NITRITE: NEGATIVE
PROTEIN: NEGATIVE mg/dL
Specific Gravity, Urine: 1.019 (ref 1.005–1.030)
pH: 6 (ref 5.0–8.0)

## 2017-04-24 LAB — CBC
HCT: 31.5 % — ABNORMAL LOW (ref 36.0–46.0)
HEMOGLOBIN: 10.3 g/dL — AB (ref 12.0–15.0)
MCH: 27.5 pg (ref 26.0–34.0)
MCHC: 32.7 g/dL (ref 30.0–36.0)
MCV: 84 fL (ref 78.0–100.0)
PLATELETS: 237 10*3/uL (ref 150–400)
RBC: 3.75 MIL/uL — ABNORMAL LOW (ref 3.87–5.11)
RDW: 14.3 % (ref 11.5–15.5)
WBC: 10.4 10*3/uL (ref 4.0–10.5)

## 2017-04-24 LAB — TSH: TSH: 1.872 u[IU]/mL (ref 0.350–4.500)

## 2017-04-24 NOTE — MAU Note (Addendum)
+  headache x2-3 days. Tried tylenol HX Migraines Denies changes in vision.  States feels like her heart is racing and feels short of breath.  GDM states blood sugars have been 160's (last checked 45 min ago)  Patient drove herself today.

## 2017-04-24 NOTE — MAU Provider Note (Addendum)
MAU HISTORY AND PHYSICAL  Chief Complaint:  Shortness of breath  Connie Jenkins is a 36 y.o.  [id number]  at [redacted]w[redacted]d presenting for the above.  Experienced episode of this on Sunday that resolved, happened again today. Feeling sob all day. No cough. No pleuritic chest pain. No chest pain. No hemoptysis. No new leg swelling. opresent at rest, not worsened w/ activity.  Intermittent mild contractions, no lof or bleeding. Positive fetal movement.   Past Medical History:  Diagnosis Date  . Anxiety    no meds  . Asthma    inhaler rarely used - twice year  . Depression    no current Tx.  . Febrile illness, acute 01/2011   Hospitalized for 6 days  . Headache   . Herpes 2008  . Hx of bronchitis 09/2012  . IBS (irritable bowel syndrome)   . Renal stone 08/26/2014  . Seasonal allergies     Past Surgical History:  Procedure Laterality Date  . CESAREAN SECTION  2000 & 2004   "1st one I dilated 1cm, then the 2nd one was a rpt  . CYSTOSCOPY WITH RETROGRADE PYELOGRAM, URETEROSCOPY AND STENT PLACEMENT Left 08/26/2014   Procedure: CYSTOSCOPY WITH RETROGRADE PYELOGRAM, URETEROSCOPY, LASER, STONE EXTRACTION WITH BASKET;  Surgeon: Raynelle Bring, MD;  Location: [facility];  Service: Urology;  Laterality: Left;  . HERNIA REPAIR    . HYSTEROSCOPY N/A 03/24/2013   Procedure: HYSTEROSCOPY / Dilitation and curettage/endometrial curettings;  Surgeon: Lavonia Drafts, MD;  Location: [facility];  Service: Gynecology;  Laterality: N/A;  Diagnostic hysteroscopy  . TMJ ARTHROPLASTY    . WISDOM TOOTH EXTRACTION      Family History  Problem Relation Age of Onset  . Hypertension Mother   . Diabetes Mother     Social History  Substance Use Topics  . Smoking status: Never Smoker  . Smokeless tobacco: Never Used  . Alcohol use No     Comment: before +UPT     Allergies  Allergen Reactions  . Flagyl [Metronidazole] Nausea And Vomiting  . Latex Itching  . Penicillins Hives and Other (See Comments)   Childhood allergy Has patient had a PCN reaction causing immediate rash, facial/tongue/throat swelling, SOB or lightheadedness with hypotension: unknown Has patient had a PCN reaction causing severe rash involving mucus membranes or skin necrosis: unknown Has patient had a PCN reaction that required hospitalization unknown Has patient had a PCN reaction occurring within the last 10 years: no If all of the above answers are "NO", then may proceed with Cephalosporin use.      Prescriptions Prior to Admission  Medication Sig Dispense Refill Last Dose  . acetaminophen (TYLENOL) 500 MG tablet Take 500 mg by mouth every 6 (six) hours as needed for headache.   04/24/2017 at Unknown time  . Prenatal Vit-Fe Fumarate-FA (MULTIVITAMIN-PRENATAL) 27-0.8 MG TABS tablet Take 1 tablet by mouth daily at 12 noon.   04/24/2017 at Unknown time  . valACYclovir (VALTREX) 1000 MG tablet Take 0.5 tablets (500 mg total) by mouth daily. (Patient taking differently: Take 500 mg by mouth daily as needed (herpes outbreak). ) 30 tablet 5 couple Month at Unknown time    Review of Systems - Negative except for what is mentioned in HPI.  Physical Exam  Blood pressure 123/67, pulse 97, temperature 98.1 F (36.7 C), temperature source Oral, resp. rate 18, weight 244 lb 0.6 oz (110.7 kg), last menstrual period 09/15/2016, SpO2 99 %. GENERAL: Well-developed, well-nourished female in no acute distress.  LUNGS: Clear  to auscultation bilaterally.  HEART: Regular rate and rhythm. ABDOMEN: Soft, nontender, nondistended,  EXTREMITIES: Nontender, no edema, 2+ distal pulses.no calf swelling or tenderness. GU: deferred FHT:  135/mod/+a/-d Contractions: quiet   Labs: Results for orders placed or performed during the hospital encounter of 04/24/17 (from the past 24 hour(s))  Urinalysis, Routine w reflex microscopic   Collection Time: 04/24/17  5:02 PM  Result Value Ref Range   Color, Urine YELLOW YELLOW   APPearance HAZY (A)  CLEAR   Specific Gravity, Urine 1.019 1.005 - 1.030   pH 6.0 5.0 - 8.0   Glucose, UA 50 (A) NEGATIVE mg/dL   Hgb urine dipstick NEGATIVE NEGATIVE   Bilirubin Urine NEGATIVE NEGATIVE   Ketones, ur NEGATIVE NEGATIVE mg/dL   Protein, ur NEGATIVE NEGATIVE mg/dL   Nitrite NEGATIVE NEGATIVE   Leukocytes, UA NEGATIVE NEGATIVE  CBC   Collection Time: 04/24/17  5:33 PM  Result Value Ref Range   WBC 10.4 4.0 - 10.5 K/uL   RBC 3.75 (L) 3.87 - 5.11 MIL/uL   Hemoglobin 10.3 (L) 12.0 - 15.0 g/dL   HCT 31.5 (L) 36.0 - 46.0 %   MCV 84.0 78.0 - 100.0 fL   MCH 27.5 26.0 - 34.0 pg   MCHC 32.7 30.0 - 36.0 g/dL   RDW 14.3 11.5 - 15.5 %   Platelets 237 150 - 400 K/uL  Comprehensive metabolic panel   Collection Time: 04/24/17  5:33 PM  Result Value Ref Range   Sodium 136 135 - 145 mmol/L   Potassium 3.5 3.5 - 5.1 mmol/L   Chloride 107 101 - 111 mmol/L   CO2 23 22 - 32 mmol/L   Glucose, Bld 111 (H) 65 - 99 mg/dL   BUN 10 6 - 20 mg/dL   Creatinine, Ser 0.59 0.44 - 1.00 mg/dL   Calcium 9.0 8.9 - 10.3 mg/dL   Total Protein 6.7 6.5 - 8.1 g/dL   Albumin 2.9 (L) 3.5 - 5.0 g/dL   AST 34 15 - 41 U/L   ALT 23 14 - 54 U/L   Alkaline Phosphatase 83 38 - 126 U/L   Total Bilirubin PENDING 0.3 - 1.2 mg/dL   GFR calc non Af Amer >60 >60 mL/min   GFR calc Af Amer >60 >60 mL/min   Anion gap 6 5 - 15  TSH   Collection Time: 04/24/17  5:33 PM  Result Value Ref Range   TSH 1.872 0.350 - 4.500 uIU/mL    Imaging Studies:  [place] Mfm Ob Limited  Result Date: 04/11/2017 ----------------------------------------------------------------------  OBSTETRICS REPORT                      (Signed Final 04/11/2017 07:46 am) ---------------------------------------------------------------------- Patient Info  ID #:       [id number]                         D.O.B.:   [birth year] (35 yrs)  Name:       Connie Jenkins                Visit Date:  04/10/2017 07:41 pm ----------------------------------------------------------------------  Performed By  Performed By:     Felecia Jan        Ref. Address:     [facility]                                                             [place], [place]  Attending:        Griffin Dakin MD         Location:         [facility] [place]  Referred By:      [facility]                    [place] ---------------------------------------------------------------------- Orders   #  Description                                 Code   1  [place] MFM OB LIMITED                           [id number]  ----------------------------------------------------------------------   #  Ordered By               Order #        Accession #    Episode #   1  Kerry Hough             [id number]      [id number]     [id number]  ---------------------------------------------------------------------- Indications   [redacted] weeks gestation of pregnancy                Z3A.29   Vaginal bleeding in pregnancy, third trimester O46.93   Poor obstetric history: Previous preterm       O09.219   delivery, antepartum 34 weeks (17p   injections)   Previous cesarean delivery, antepartum x 2     G83.662   Obesity complicating pregnancy, second         O99.212   trimester  ---------------------------------------------------------------------- OB History  Blood Type:  Height:  5'3"   Weight (lb):  228      BMI:   40.38  Gravidity:    4         Term:   1        Prem:   1  Living:       2 ---------------------------------------------------------------------- Fetal Evaluation  Num Of Fetuses:     1  Fetal Heart         150  Rate(bpm):  Cardiac Activity:   Observed  Presentation:       Cephalic  Placenta:            Posterior Fundal, above cervical os  Amniotic Fluid  AFI FV:      Subjectively within normal limits  AFI Sum(cm)     %Tile       Largest Pocket(cm)  20.29           80          6  RUQ(cm)       RLQ(cm)       LUQ(cm)        LLQ(cm)  4.71          5.02          4.56           6  Comment:    No apparent abruption. Portions of posterior placenta are              obscurred by fetal parts. ---------------------------------------------------------------------- Gestational Age  LMP:           29w 4d       Date:   09/15/16                 EDD:   06/22/17  Best:          29w 4d    Det. By:   LMP  (09/15/16)          EDD:   06/22/17 ---------------------------------------------------------------------- Cervix Uterus Adnexa  Cervix  Not visualized (advanced GA >29wks)  Left Ovary  Within normal limits.  Right Ovary  Within normal limits. ---------------------------------------------------------------------- Impression  IUP at 29+4 weeks with vaginal bleeding  Normal fetal movement and cardiac activity  Normal amniotic fluid  Normal placentation without evidence of abruption or previa ---------------------------------------------------------------------- Recommendations  Continue clinical evaluation and management ----------------------------------------------------------------------                 Griffin Dakin, MD Electronically Signed Final Report   04/11/2017 07:46 am ----------------------------------------------------------------------   Assessment/Plan: KIMEKA BADOUR is  36 y.o. [phone] at 107w4d presents with shortness of breath. On exam normal respiratory rate, normal o2, lungs clear, afebrile, otherwise feeling her normal self. No chest pain or doe to suggest cardiac etiology. No tachypnea, hypoxia, hemoptysis, pleuritic chest pain, or s/s dvt to suggest PE. No cough or fever or focal findings on lung exam to suggest infectious etiology. Mild anemia is at baseline, no electrolyte abnormalities, TSH wnl, ECG  unremarkable. NST reactive. Likely physiologic dyspnea of pregnancy. D/c home with infection, ACS, and PE return precautions. Has ob f/u scheduled next week.    Patsy Lager Roshelle Traub 5/29/20186:44 PM

## 2017-04-24 NOTE — Discharge Instructions (Signed)
Shortness of Breath, Adult  Shortness of breath means you have trouble breathing. Your lungs are organs for breathing.  Follow these instructions at home:  Pay attention to any changes in your symptoms. Take these actions to help with your condition:  ? Do not smoke. Smoking can cause shortness of breath. If you need help to quit smoking, ask your doctor.  ? Avoid things that can make it harder to breathe, such as:  ? Mold.  ? Dust.  ? Air pollution.  ? Chemical smells.  ? Things that can cause allergy symptoms (allergens), if you have allergies.  ? Keep your living space clean and free of mold and dust.  ? Rest as needed. Slowly return to your usual activities.  ? Take over-the-counter and prescription medicines, including oxygen and inhaled medicines, only as told by your doctor.  ? Keep all follow-up visits as told by your doctor. This is important.  Contact a doctor if:  ? Your condition does not get better as soon as expected.  ? You have a hard time doing your normal activities, even after you rest.  ? You have new symptoms.  Get help right away if:  ? You have trouble breathing when you are resting.  ? You feel light-headed or you faint.  ? You have a cough that is not helped by medicines.  ? You cough up blood.  ? You have pain with breathing.  ? You have pain in your chest, arms, shoulders, or belly (abdomen).  ? You have a fever.  ? You cannot walk up stairs.  ? You cannot exercise the way you normally do.  This information is not intended to replace advice given to you by your health care provider. Make sure you discuss any questions you have with your health care provider.  Document Released: 05/01/2008 Document Revised: 11/30/2016 Document Reviewed: 11/30/2016  Elsevier Interactive Patient Education ? 2017 Elsevier Inc[employer]

## 2017-04-30 ENCOUNTER — Encounter: Payer: Self-pay | Admitting: Obstetrics & Gynecology

## 2017-04-30 ENCOUNTER — Other Ambulatory Visit (HOSPITAL_COMMUNITY)
Admission: RE | Admit: 2017-04-30 | Discharge: 2017-04-30 | Disposition: A | Discharge status: Home / Self Care | Payer: Medicaid Other | Source: Ambulatory Visit | Attending: Obstetrics & Gynecology | Admitting: Obstetrics & Gynecology

## 2017-04-30 ENCOUNTER — Ambulatory Visit (INDEPENDENT_AMBULATORY_CARE_PROVIDER_SITE_OTHER): Payer: Medicaid Other | Admitting: Obstetrics & Gynecology

## 2017-04-30 VITALS — BP 120/71 | HR 93 | Wt 241.4 lb

## 2017-04-30 DIAGNOSIS — O24419 Gestational diabetes mellitus in pregnancy, unspecified control: Secondary | ICD-10-CM | POA: Diagnosis not present

## 2017-04-30 DIAGNOSIS — O0993 Supervision of high risk pregnancy, unspecified, third trimester: Secondary | ICD-10-CM

## 2017-04-30 DIAGNOSIS — N898 Other specified noninflammatory disorders of vagina: Secondary | ICD-10-CM | POA: Insufficient documentation

## 2017-04-30 DIAGNOSIS — O0990 Supervision of high risk pregnancy, unspecified, unspecified trimester: Secondary | ICD-10-CM

## 2017-04-30 MED ORDER — GLYBURIDE 2.5 MG PO TABS: 2.5000 mg | ORAL_TABLET | Freq: Every day | ORAL | 3 refills | Status: DC

## 2017-04-30 NOTE — Progress Notes (Signed)
Pt complains of having headaches for about a week. No relief with tylenol.

## 2017-04-30 NOTE — Patient Instructions (Signed)

## 2017-04-30 NOTE — Progress Notes (Signed)
   PRENATAL VISIT NOTE  Subjective:  Connie Jenkins is a 36 y.o. G3P1102 at [redacted]w[redacted]d being seen today for ongoing prenatal care.  She is currently monitored for the following issues for this high-risk pregnancy and has CONDYLOMA ACUMINATUM; Previous cesarean section complicating pregnancy, antepartum condition or complication; Supervision of high risk pregnancy, antepartum; History of preterm delivery, currently pregnant; Herpes simplex virus (HSV) infection; Migraines; Gestational diabetes mellitus (GDM) affecting pregnancy; and Unwanted fertility on her problem list.  Patient reports headache.  Contractions: Irregular. Vag. Bleeding: None, Scant.  Movement: Present. Denies leaking of fluid.   The following portions of the patient's history were reviewed and updated as appropriate: allergies, current medications, past family history, past medical history, past social history, past surgical history and problem list. Problem list updated.  Objective:   Vitals:   04/30/17 1313  BP: 120/71  Pulse: 93  Weight: 109.5 kg (241 lb 6.4 oz)    Fetal Status: Fetal Heart Rate (bpm): 150   Movement: Present     General:  Alert, oriented and cooperative. Patient is in no acute distress.  Skin: Skin is warm and dry. No rash noted.   Cardiovascular: Normal heart rate noted  Respiratory: Normal respiratory effort, no problems with respiration noted  Abdomen: Soft, gravid, appropriate for gestational age. Pain/Pressure: Present     Pelvic:  Cervical exam performed      white discharge no blood, probe obtained  Extremities: Normal range of motion.  Edema: Mild pitting, slight indentation  Mental Status: Normal mood and affect. Normal behavior. Normal judgment and thought content.   Assessment and Plan:  Pregnancy: G3P1102 at [redacted]w[redacted]d  1. Supervision of high risk pregnancy, antepartum H/o CS x 2  2. Gestational diabetes mellitus (GDM) affecting pregnancy FBS above 95 up to 112 and pp up to 140-160, start  medication - glyBURIDE (DIABETA) 2.5 MG tablet; Take 1 tablet (2.5 mg total) by mouth at bedtime.  Dispense: 30 tablet; Refill: 3  Preterm labor symptoms and general obstetric precautions including but not limited to vaginal bleeding, contractions, leaking of fluid and fetal movement were reviewed in detail with the patient. Please refer to After Visit Summary for other counseling recommendations.  Return in about 1 week (around 05/07/2017).   Emeterio Reeve, MD

## 2017-05-01 LAB — CERVICOVAGINAL ANCILLARY ONLY
BACTERIAL VAGINITIS: NEGATIVE
CANDIDA VAGINITIS: NEGATIVE

## 2017-05-08 ENCOUNTER — Ambulatory Visit (INDEPENDENT_AMBULATORY_CARE_PROVIDER_SITE_OTHER): Payer: Medicaid Other | Admitting: Obstetrics & Gynecology

## 2017-05-08 VITALS — BP 126/76 | HR 101 | Wt 243.0 lb

## 2017-05-08 DIAGNOSIS — O24415 Gestational diabetes mellitus in pregnancy, controlled by oral hypoglycemic drugs: Secondary | ICD-10-CM | POA: Diagnosis not present

## 2017-05-08 DIAGNOSIS — O0993 Supervision of high risk pregnancy, unspecified, third trimester: Secondary | ICD-10-CM | POA: Diagnosis not present

## 2017-05-08 DIAGNOSIS — O0990 Supervision of high risk pregnancy, unspecified, unspecified trimester: Secondary | ICD-10-CM

## 2017-05-08 NOTE — Progress Notes (Signed)
   PRENATAL VISIT NOTE  Subjective:  Connie Jenkins is a 36 y.o. G3P1102 at [redacted]w[redacted]d being seen today for ongoing prenatal care.  She is currently monitored for the following issues for this high-risk pregnancy and has CONDYLOMA ACUMINATUM; Previous cesarean section complicating pregnancy, antepartum condition or complication; Supervision of high risk pregnancy, antepartum; History of preterm delivery, currently pregnant; Herpes simplex virus (HSV) infection; Migraines; Gestational diabetes mellitus (GDM) affecting pregnancy; and Unwanted fertility on her problem list.  Patient reports headache.  Contractions: Irregular. Vag. Bleeding: None.  Movement: Present. Denies leaking of fluid.   The following portions of the patient's history were reviewed and updated as appropriate: allergies, current medications, past family history, past medical history, past social history, past surgical history and problem list. Problem list updated.  Objective:   Vitals:   05/08/17 0929  BP: 126/76  Pulse: (!) 101  Weight: 243 lb (110.2 kg)    Fetal Status: Fetal Heart Rate (bpm): NST    Movement: Present     General:  Alert, oriented and cooperative. Patient is in no acute distress.  Skin: Skin is warm and dry. No rash noted.   Cardiovascular: Normal heart rate noted  Respiratory: Normal respiratory effort, no problems with respiration noted  Abdomen: Soft, gravid, appropriate for gestational age. Pain/Pressure: Present     Pelvic:  Cervical exam performed        Extremities: Normal range of motion.  Edema: Mild pitting, slight indentation  Mental Status: Normal mood and affect. Normal behavior. Normal judgment and thought content.   Assessment and Plan:  Pregnancy: G3P1102 at [redacted]w[redacted]d  1. Supervision of high risk pregnancy, antepartum   2. Gestational diabetes mellitus (GDM) in third trimester controlled on oral hypoglycemic drug - sugars are ok, occasional high one - Fetal nonstress test - Amniotic  fluid index - US FETAL BPP W/NONSTRESS; Future 3. Headache since starting glyburide- rec Tylenol  Preterm labor symptoms and general obstetric precautions including but not limited to vaginal bleeding, contractions, leaking of fluid and fetal movement were reviewed in detail with the patient. Please refer to After Visit Summary for other counseling recommendations.  Return in about 1 week (around 05/15/2017) for NST and MD visit. BPP at MFM this Friday  Emily Filbert, MD

## 2017-05-09 ENCOUNTER — Inpatient Hospital Stay (HOSPITAL_COMMUNITY)
Admission: AD | Admit: 2017-05-09 | Discharge: 2017-05-09 | Disposition: A | Discharge status: Home / Self Care | Payer: Medicaid Other | Source: Ambulatory Visit | Attending: Obstetrics and Gynecology | Admitting: Obstetrics and Gynecology

## 2017-05-09 ENCOUNTER — Encounter (HOSPITAL_COMMUNITY): Payer: Self-pay | Admitting: *Deleted

## 2017-05-09 DIAGNOSIS — O368130 Decreased fetal movements, third trimester, not applicable or unspecified: Secondary | ICD-10-CM

## 2017-05-09 DIAGNOSIS — Z87442 Personal history of urinary calculi: Secondary | ICD-10-CM | POA: Insufficient documentation

## 2017-05-09 DIAGNOSIS — F329 Major depressive disorder, single episode, unspecified: Secondary | ICD-10-CM | POA: Diagnosis not present

## 2017-05-09 DIAGNOSIS — Z8249 Family history of ischemic heart disease and other diseases of the circulatory system: Secondary | ICD-10-CM | POA: Diagnosis not present

## 2017-05-09 DIAGNOSIS — Z8619 Personal history of other infectious and parasitic diseases: Secondary | ICD-10-CM | POA: Diagnosis not present

## 2017-05-09 DIAGNOSIS — F419 Anxiety disorder, unspecified: Secondary | ICD-10-CM | POA: Diagnosis not present

## 2017-05-09 DIAGNOSIS — Z833 Family history of diabetes mellitus: Secondary | ICD-10-CM | POA: Insufficient documentation

## 2017-05-09 DIAGNOSIS — Z91040 Latex allergy status: Secondary | ICD-10-CM | POA: Insufficient documentation

## 2017-05-09 DIAGNOSIS — O99343 Other mental disorders complicating pregnancy, third trimester: Secondary | ICD-10-CM | POA: Diagnosis not present

## 2017-05-09 DIAGNOSIS — K589 Irritable bowel syndrome without diarrhea: Secondary | ICD-10-CM | POA: Insufficient documentation

## 2017-05-09 DIAGNOSIS — N898 Other specified noninflammatory disorders of vagina: Secondary | ICD-10-CM | POA: Diagnosis not present

## 2017-05-09 DIAGNOSIS — R109 Unspecified abdominal pain: Secondary | ICD-10-CM | POA: Diagnosis present

## 2017-05-09 DIAGNOSIS — Z3A34 34 weeks gestation of pregnancy: Secondary | ICD-10-CM | POA: Diagnosis not present

## 2017-05-09 DIAGNOSIS — J45909 Unspecified asthma, uncomplicated: Secondary | ICD-10-CM | POA: Diagnosis not present

## 2017-05-09 DIAGNOSIS — Z880 Allergy status to penicillin: Secondary | ICD-10-CM | POA: Diagnosis not present

## 2017-05-09 DIAGNOSIS — Z9889 Other specified postprocedural states: Secondary | ICD-10-CM | POA: Insufficient documentation

## 2017-05-09 DIAGNOSIS — O9989 Other specified diseases and conditions complicating pregnancy, childbirth and the puerperium: Secondary | ICD-10-CM | POA: Diagnosis not present

## 2017-05-09 DIAGNOSIS — O9953 Diseases of the respiratory system complicating the puerperium: Secondary | ICD-10-CM | POA: Diagnosis not present

## 2017-05-09 DIAGNOSIS — Z881 Allergy status to other antibiotic agents status: Secondary | ICD-10-CM | POA: Diagnosis not present

## 2017-05-09 NOTE — MAU Note (Addendum)
Pt  Says she  Gets [facility]  At [place].    Arrived via EMS - says last time  She felt  Baby move - 2300-   She has drank water -. Had an U/S  At [facility] - incomplete     . Felt some Uc's  Today .  VE- today closed

## 2017-05-09 NOTE — MAU Provider Note (Signed)
History     CSN: [id number]  Arrival date and time: 05/09/17 0456   None     No chief complaint on file.  Patient is a 36 y/o G3P2 who presents to MAU via EMS for decreased fetal movement. She also reports some crampy abdominal pain for the past couple of days. She does have a history of gDM and is on glyburide. She reports her baby is usually very active. EMS did check a cbg and it was 94. She reports some chronic dark vaginal discharge that is no different than usual. She reports that she was seen in the office today and was complaining of pressure. She was checked and was closed.     OB History    Gravida Para Term Preterm AB Living   3 2 1 1  0 2   SAB TAB Ectopic Multiple Live Births   0       2      Past Medical History:  Diagnosis Date  . Anxiety    no meds  . Asthma    inhaler rarely used - twice year  . Depression    no current Tx.  . Febrile illness, acute 01/2011   Hospitalized for 6 days  . Headache   . Herpes 2008  . Hx of bronchitis 09/2012  . IBS (irritable bowel syndrome)   . Renal stone 08/26/2014  . Seasonal allergies     Past Surgical History:  Procedure Laterality Date  . CESAREAN SECTION  2000 & 2004   "1st one I dilated 1cm, then the 2nd one was a rpt  . CYSTOSCOPY WITH RETROGRADE PYELOGRAM, URETEROSCOPY AND STENT PLACEMENT Left 08/26/2014   Procedure: CYSTOSCOPY WITH RETROGRADE PYELOGRAM, URETEROSCOPY, LASER, STONE EXTRACTION WITH BASKET;  Surgeon: Raynelle Bring, MD;  Location: [facility];  Service: Urology;  Laterality: Left;  . HERNIA REPAIR    . HYSTEROSCOPY N/A 03/24/2013   Procedure: HYSTEROSCOPY / Dilitation and curettage/endometrial curettings;  Surgeon: Lavonia Drafts, MD;  Location: [facility];  Service: Gynecology;  Laterality: N/A;  Diagnostic hysteroscopy  . TMJ ARTHROPLASTY    . WISDOM TOOTH EXTRACTION      Family History  Problem Relation Age of Onset  . Hypertension Mother   . Diabetes Mother     Social History  Substance  Use Topics  . Smoking status: Never Smoker  . Smokeless tobacco: Never Used  . Alcohol use No     Comment: before +UPT     Allergies:  Allergies  Allergen Reactions  . Flagyl [Metronidazole] Nausea And Vomiting  . Latex Itching  . Penicillins Hives and Other (See Comments)    Childhood allergy Has patient had a PCN reaction causing immediate rash, facial/tongue/throat swelling, SOB or lightheadedness with hypotension: unknown Has patient had a PCN reaction causing severe rash involving mucus membranes or skin necrosis: unknown Has patient had a PCN reaction that required hospitalization unknown Has patient had a PCN reaction occurring within the last 10 years: no If all of the above answers are "NO", then may proceed with Cephalosporin use.      Prescriptions Prior to Admission  Medication Sig Dispense Refill Last Dose  . Prenatal Vit-Fe Fumarate-FA (MULTIVITAMIN-PRENATAL) 27-0.8 MG TABS tablet Take 1 tablet by mouth daily at 12 noon.   Past Week at Unknown time  . valACYclovir (VALTREX) 1000 MG tablet Take 0.5 tablets (500 mg total) by mouth daily. (Patient taking differently: Take 500 mg by mouth daily as needed (herpes outbreak). ) 30 tablet 5 05/08/2017  at Unknown time  . acetaminophen (TYLENOL) 500 MG tablet Take 500 mg by mouth every 6 (six) hours as needed for headache.   Not Taking  . glyBURIDE (DIABETA) 2.5 MG tablet Take 1 tablet (2.5 mg total) by mouth at bedtime. 30 tablet 3 05/07/2017    Review of Systems  Constitutional: Negative for chills and fever.  HENT: Negative for congestion and rhinorrhea.   Respiratory: Negative for cough and shortness of breath.   Cardiovascular: Negative for chest pain and palpitations.  Gastrointestinal: Positive for abdominal distention. Negative for constipation, diarrhea, nausea and vomiting.  Genitourinary: Negative for difficulty urinating, dysuria and frequency.  Neurological: Negative for dizziness and weakness.   Physical Exam    Blood pressure 120/78, pulse 90, temperature 98 F (36.7 C), temperature source Oral, resp. rate (!) 24, last menstrual period 09/15/2016, SpO2 97 %.  Physical Exam  Vitals reviewed. Constitutional: She is oriented to person, place, and time. She appears well-developed and well-nourished.  HENT:  Head: Normocephalic and atraumatic.  Cardiovascular: Normal rate and intact distal pulses.   Respiratory: Effort normal. No respiratory distress.  GI: Soft. She exhibits no distension. There is no tenderness. There is no rebound and no guarding.  Genitourinary:  Genitourinary Comments: Cervix c/50/-1  Musculoskeletal: Normal range of motion. She exhibits no edema.  Neurological: She is alert and oriented to person, place, and time. A cranial nerve deficit is present.  Skin: Skin is warm and dry.  Psychiatric: She has a normal mood and affect. Her behavior is normal.    MAU Course  Procedures  MDM IN mau patient underwent continuous tocometetry which revealed some irritability which improved with PO hydration  FHTS: 140/mod var/reactive   Assessment and Plan  #1: Decreased fetal movement: patient reactive in mau OK to D/C home.  Connie Jenkins 05/09/2017, 5:23 AM

## 2017-05-09 NOTE — Discharge Instructions (Signed)

## 2017-05-11 ENCOUNTER — Encounter (HOSPITAL_COMMUNITY): Payer: Self-pay

## 2017-05-11 ENCOUNTER — Ambulatory Visit (HOSPITAL_COMMUNITY): Payer: Medicaid Other

## 2017-05-11 ENCOUNTER — Ambulatory Visit (HOSPITAL_COMMUNITY)
Admission: RE | Admit: 2017-05-11 | Discharge: 2017-05-11 | Disposition: A | Discharge status: Home / Self Care | Payer: Medicaid Other | Source: Ambulatory Visit | Attending: Certified Nurse Midwife | Admitting: Certified Nurse Midwife

## 2017-05-11 DIAGNOSIS — O0990 Supervision of high risk pregnancy, unspecified, unspecified trimester: Secondary | ICD-10-CM | POA: Diagnosis not present

## 2017-05-11 DIAGNOSIS — O09899 Supervision of other high risk pregnancies, unspecified trimester: Secondary | ICD-10-CM

## 2017-05-11 DIAGNOSIS — O34219 Maternal care for unspecified type scar from previous cesarean delivery: Secondary | ICD-10-CM

## 2017-05-11 DIAGNOSIS — Z3A34 34 weeks gestation of pregnancy: Secondary | ICD-10-CM | POA: Insufficient documentation

## 2017-05-11 DIAGNOSIS — O99213 Obesity complicating pregnancy, third trimester: Secondary | ICD-10-CM | POA: Insufficient documentation

## 2017-05-11 DIAGNOSIS — O09219 Supervision of pregnancy with history of pre-term labor, unspecified trimester: Secondary | ICD-10-CM | POA: Insufficient documentation

## 2017-05-11 DIAGNOSIS — O24415 Gestational diabetes mellitus in pregnancy, controlled by oral hypoglycemic drugs: Secondary | ICD-10-CM | POA: Insufficient documentation

## 2017-05-11 DIAGNOSIS — O24419 Gestational diabetes mellitus in pregnancy, unspecified control: Secondary | ICD-10-CM

## 2017-05-14 ENCOUNTER — Other Ambulatory Visit: Payer: Self-pay | Admitting: Certified Nurse Midwife

## 2017-05-15 ENCOUNTER — Encounter (HOSPITAL_COMMUNITY): Payer: Self-pay

## 2017-05-15 ENCOUNTER — Telehealth: Payer: Self-pay | Admitting: *Deleted

## 2017-05-15 ENCOUNTER — Ambulatory Visit (INDEPENDENT_AMBULATORY_CARE_PROVIDER_SITE_OTHER): Payer: Medicaid Other | Admitting: Obstetrics & Gynecology

## 2017-05-15 VITALS — BP 119/69 | HR 93 | Wt 240.0 lb

## 2017-05-15 DIAGNOSIS — O0990 Supervision of high risk pregnancy, unspecified, unspecified trimester: Secondary | ICD-10-CM

## 2017-05-15 DIAGNOSIS — O24419 Gestational diabetes mellitus in pregnancy, unspecified control: Secondary | ICD-10-CM | POA: Diagnosis not present

## 2017-05-15 DIAGNOSIS — O0993 Supervision of high risk pregnancy, unspecified, third trimester: Secondary | ICD-10-CM

## 2017-05-15 MED ORDER — PANTOPRAZOLE SODIUM 20 MG PO TBEC: 20.0000 mg | DELAYED_RELEASE_TABLET | Freq: Every day | ORAL | 1 refills | Status: DC

## 2017-05-15 NOTE — Progress Notes (Signed)
NST Rx and BPP 8/8 4 days ago    PRENATAL VISIT NOTE  Subjective:  Connie Jenkins is a 36 y.o. G3P1102 at [redacted]w[redacted]d being seen today for ongoing prenatal care.  She is currently monitored for the following issues for this high-risk pregnancy and has CONDYLOMA ACUMINATUM; Previous cesarean section complicating pregnancy, antepartum condition or complication; Supervision of high risk pregnancy, antepartum; History of preterm delivery, currently pregnant; Herpes simplex virus (HSV) infection; Migraines; Gestational diabetes mellitus (GDM) affecting pregnancy; and Unwanted fertility on her problem list. Heartburn and nausea Patient reports no complaints.  Contractions: Irregular. Vag. Bleeding: None.  Movement: Present. Denies leaking of fluid.   The following portions of the patient's history were reviewed and updated as appropriate: allergies, current medications, past family history, past medical history, past social history, past surgical history and problem list. Problem list updated.  Objective:   Vitals:   05/15/17 1020  BP: 119/69  Pulse: 93  Weight: 240 lb (108.9 kg)    Fetal Status:     Movement: Present     General:  Alert, oriented and cooperative. Patient is in no acute distress.  Skin: Skin is warm and dry. No rash noted.   Cardiovascular: Normal heart rate noted  Respiratory: Normal respiratory effort, no problems with respiration noted  Abdomen: Soft, gravid, appropriate for gestational age. Pain/Pressure: Present     Pelvic:  Cervical exam deferred        Extremities: Normal range of motion.  Edema: Mild pitting, slight indentation  Mental Status: Normal mood and affect. Normal behavior. Normal judgment and thought content.   Assessment and Plan:  Pregnancy: G3P1102 at [redacted]w[redacted]d  1. Supervision of high risk pregnancy, antepartum reactive - Fetal nonstress test; Future - pantoprazole (PROTONIX) 20 MG tablet; Take 1 tablet (20 mg total) by mouth daily.  Dispense: 30 tablet;  Refill: 1  2. Gestational diabetes mellitus (GDM) affecting pregnancy protonix for HB. Continue present glyburide  Preterm labor symptoms and general obstetric precautions including but not limited to vaginal bleeding, contractions, leaking of fluid and fetal movement were reviewed in detail with the patient. Please refer to After Visit Summary for other counseling recommendations.  Return in about 3 days (around 05/18/2017).   Emeterio Reeve, MD

## 2017-05-15 NOTE — Telephone Encounter (Signed)
Fax from pharmacy for 90 day supply fill on Pantoprazole 20mg . Per Dr Roselie Awkward, ok to fill for 90 day.  Approved and faxed back to pharmacy.

## 2017-05-15 NOTE — Patient Instructions (Signed)
Cesarean Delivery Cesarean birth, or cesarean delivery, is the surgical delivery of a baby through an incision in the abdomen and the uterus. This may be referred to as a C-section. This procedure may be scheduled ahead of time, or it may be done in an emergency situation. Tell a health care provider about:  Any allergies you have.  All medicines you are taking, including vitamins, herbs, eye drops, creams, and over-the-counter medicines.  Any problems you or family members have had with anesthetic medicines.  Any blood disorders you have.  Any surgeries you have had.  Any medical conditions you have.  Whether you or any members of your family have a history of deep vein thrombosis (DVT) or pulmonary embolism (PE). What are the risks? Generally, this is a safe procedure. However, problems may occur, including:  Infection.  Bleeding.  Allergic reactions to medicines.  Damage to other structures or organs.  Blood clots.  Injury to your baby.  What happens before the procedure?  Follow instructions from your health care provider about eating or drinking restrictions.  Follow instructions from your health care provider about bathing before your procedure to help reduce your risk of infection.  If you know that you are going to have a cesarean delivery, do not shave your pubic area. Shaving before the procedure may increase your risk of infection.  Ask your health care provider about: ? Changing or stopping your regular medicines. This is especially important if you are taking diabetes medicines or blood thinners. ? Your pain management plan. This is especially important if you plan to breastfeed your baby. ? How long you will be in the hospital after the procedure. ? Any concerns you may have about receiving blood products if you need them during the procedure. ? Cord blood banking, if you plan to collect your baby's umbilical cord blood.  You may also want to ask your  health care provider: ? Whether you will be able to hold or breastfeed your baby while you are still in the operating room. ? Whether your baby can stay with you immediately after the procedure and during your recovery. ? Whether a family member or a person of your choice can go with you into the operating room and stay with you during the procedure, immediately after the procedure, and during your recovery.  Plan to have someone drive you home when you are discharged from the hospital. What happens during the procedure?  Fetal monitors will be placed on your abdomen to monitor your heart rate and your baby's heart rate.  Depending on the reason for your cesarean delivery, you may have a physical exam or additional testing, such as an ultrasound.  An IV tube will be inserted into one of your veins.  You may have your blood or urine tested.  You will be given antibiotic medicine to help prevent infection.  You may be given a special warming gown to wear to keep your temperature stable.  Hair may be removed from your pubic area.  The skin of your pubic area and lower abdomen will be cleaned with a germ-killing solution (antiseptic).  A catheter may be inserted into your bladder through your urethra. This drains your urine during the procedure.  You may be given one or more of the following: ? A medicine to numb the area (local anesthetic). ? A medicine to make you fall asleep (general anesthetic). ? A medicine (regional anesthetic) that is injected into your back or through a small   thin tube placed in your back (spinal anesthetic or epidural anesthetic). This numbs everything below the injection site and allows you to stay awake during your procedure. If this makes you feel nauseous, tell your health care provider. Medicines will be available to help reduce any nausea you may feel. °· An incision will be made in your abdomen, and then in your uterus. °· If you are awake during your  procedure, you may feel tugging and pulling in your abdomen, but you should not feel pain. If you feel pain, tell your health care provider immediately. °· Your baby will be removed from your uterus. You may feel more pressure or pushing while this happens. °· Immediately after birth, your baby will be dried and kept warm. You may be able to hold and breastfeed your baby. The umbilical cord may be clamped and cut during this time. °· Your placenta will be removed from your uterus. °· Your incisions will be closed with stitches (sutures). Staples, skin glue, or adhesive strips may also be applied to the incision in your abdomen. °· Bandages (dressings) will be placed over the incision in your abdomen. °The procedure may vary among health care providers and hospitals. °What happens after the procedure? °· Your blood pressure, heart rate, breathing rate, and blood oxygen level will be monitored often until the medicines you were given have worn off. °· You may continue to receive fluids and medicines through an IV tube. °· You will have some pain. Medicines will be available to help control your pain. °· To help prevent blood clots: °? You may be given medicines. °? You may have to wear compression stockings or devices. °? You will be encouraged to walk around when you are able. °· Hospital staff will encourage and support bonding with your baby. Your hospital may allow you and your baby to stay in the same room (rooming in) during your hospital stay to encourage successful breastfeeding. °· You may be encouraged to cough and breathe deeply often. This helps to prevent lung problems. °· If you have a catheter draining your urine, it will be removed as soon as possible after your procedure. °This information is not intended to replace advice given to you by your health care provider. Make sure you discuss any questions you have with your health care provider. °Document Released: 11/13/2005 Document Revised: 04/20/2016  Document Reviewed: 08/24/2015 °Elsevier Interactive Patient Education © 2017 [employer] ° °

## 2017-05-18 ENCOUNTER — Ambulatory Visit (INDEPENDENT_AMBULATORY_CARE_PROVIDER_SITE_OTHER): Payer: Medicaid Other | Admitting: Obstetrics and Gynecology

## 2017-05-18 ENCOUNTER — Other Ambulatory Visit: Payer: Self-pay | Admitting: Obstetrics & Gynecology

## 2017-05-18 ENCOUNTER — Other Ambulatory Visit (HOSPITAL_COMMUNITY)
Admission: RE | Admit: 2017-05-18 | Discharge: 2017-05-18 | Disposition: A | Discharge status: Home / Self Care | Payer: Medicaid Other | Source: Ambulatory Visit | Attending: Obstetrics and Gynecology | Admitting: Obstetrics and Gynecology

## 2017-05-18 VITALS — BP 126/78 | HR 92

## 2017-05-18 DIAGNOSIS — Z3A35 35 weeks gestation of pregnancy: Secondary | ICD-10-CM | POA: Diagnosis not present

## 2017-05-18 DIAGNOSIS — O09213 Supervision of pregnancy with history of pre-term labor, third trimester: Secondary | ICD-10-CM

## 2017-05-18 DIAGNOSIS — O24415 Gestational diabetes mellitus in pregnancy, controlled by oral hypoglycemic drugs: Secondary | ICD-10-CM

## 2017-05-18 DIAGNOSIS — O24419 Gestational diabetes mellitus in pregnancy, unspecified control: Secondary | ICD-10-CM | POA: Diagnosis not present

## 2017-05-18 DIAGNOSIS — O34219 Maternal care for unspecified type scar from previous cesarean delivery: Secondary | ICD-10-CM

## 2017-05-18 DIAGNOSIS — Z113 Encounter for screening for infections with a predominantly sexual mode of transmission: Secondary | ICD-10-CM | POA: Diagnosis not present

## 2017-05-18 DIAGNOSIS — O0993 Supervision of high risk pregnancy, unspecified, third trimester: Secondary | ICD-10-CM | POA: Insufficient documentation

## 2017-05-18 DIAGNOSIS — Z3009 Encounter for other general counseling and advice on contraception: Secondary | ICD-10-CM

## 2017-05-18 DIAGNOSIS — O09219 Supervision of pregnancy with history of pre-term labor, unspecified trimester: Secondary | ICD-10-CM

## 2017-05-18 DIAGNOSIS — O0990 Supervision of high risk pregnancy, unspecified, unspecified trimester: Secondary | ICD-10-CM

## 2017-05-18 DIAGNOSIS — O09899 Supervision of other high risk pregnancies, unspecified trimester: Secondary | ICD-10-CM

## 2017-05-18 NOTE — Progress Notes (Signed)
Subjective:  Connie Jenkins is a 36 y.o. G3P1102 at [redacted]w[redacted]d being seen today for ongoing prenatal care.  She is currently monitored for the following issues for this high-risk pregnancy and has CONDYLOMA ACUMINATUM; Previous cesarean section complicating pregnancy, antepartum condition or complication; Supervision of high risk pregnancy, antepartum; History of preterm delivery, currently pregnant; Herpes simplex virus (HSV) infection; Migraines; Gestational diabetes mellitus (GDM) affecting pregnancy; and Unwanted fertility on her problem list.  Patient reports occasional contractions.   .  .  Movement: Present. Denies leaking of fluid.   The following portions of the patient's history were reviewed and updated as appropriate: allergies, current medications, past family history, past medical history, past social history, past surgical history and problem list. Problem list updated.  Objective:   Vitals:   05/18/17 1027  BP: 126/78  Pulse: 92    Fetal Status: Fetal Heart Rate (bpm): NST   Movement: Present     General:  Alert, oriented and cooperative. Patient is in no acute distress.  Skin: Skin is warm and dry. No rash noted.   Cardiovascular: Normal heart rate noted  Respiratory: Normal respiratory effort, no problems with respiration noted  Abdomen: Soft, gravid, appropriate for gestational age.       Pelvic:  Cervical exam performed        Extremities: Normal range of motion.     Mental Status: Normal mood and affect. Normal behavior. Normal judgment and thought content.   Urinalysis:      Assessment and Plan:  Pregnancy: G3P1102 at [redacted]w[redacted]d  1. Gestational diabetes mellitus (GDM) affecting pregnancy BS still not in goal range Will increase Diabeta to 2.5 mg po bid Continue with antenatal testing RNST today  2. History of preterm delivery, currently pregnant Off 17 OHP  3. Gestational diabetes mellitus (GDM) in third trimester controlled on oral hypoglycemic drug As above -  US FETAL BPP W/NONSTRESS  4. Previous cesarean section complicating pregnancy, antepartum condition or complication Repeat c section at 39 weeks, scheduled  5. Unwanted fertility BTL papers signed  6. Supervision of high risk pregnancy, antepartum GBS and cultures today F/U next week  Preterm labor symptoms and general obstetric precautions including but not limited to vaginal bleeding, contractions, leaking of fluid and fetal movement were reviewed in detail with the patient. Please refer to After Visit Summary for other counseling recommendations.  No Follow-up on file.   Chancy Milroy, MD

## 2017-05-18 NOTE — Progress Notes (Addendum)
Here for nst only today. C/o increased uc's overnight and pinkish/dark brownish vaginal discharge last few days. Noted uc's with UI on nst. Notified Dr. Kennon Rounds- will have Dr. Rip Harbour do cervix exam.

## 2017-05-18 NOTE — Addendum Note (Signed)
Addended bySamuel Germany on: 05/18/2017 11:51 AM   Modules accepted: Orders

## 2017-05-18 NOTE — Addendum Note (Signed)
Addended by: Riccardo Dubin on: 05/18/2017 12:04 PM   Modules accepted: Orders

## 2017-05-21 ENCOUNTER — Encounter (HOSPITAL_COMMUNITY): Payer: Self-pay | Admitting: *Deleted

## 2017-05-21 ENCOUNTER — Inpatient Hospital Stay (HOSPITAL_COMMUNITY)
Admission: AD | Admit: 2017-05-21 | Discharge: 2017-05-21 | Disposition: A | Discharge status: Home / Self Care | Payer: Medicaid Other | Source: Ambulatory Visit | Attending: Obstetrics & Gynecology | Admitting: Obstetrics & Gynecology

## 2017-05-21 ENCOUNTER — Telehealth: Payer: Self-pay

## 2017-05-21 DIAGNOSIS — R109 Unspecified abdominal pain: Secondary | ICD-10-CM | POA: Diagnosis not present

## 2017-05-21 DIAGNOSIS — O26893 Other specified pregnancy related conditions, third trimester: Secondary | ICD-10-CM | POA: Diagnosis not present

## 2017-05-21 DIAGNOSIS — O99513 Diseases of the respiratory system complicating pregnancy, third trimester: Secondary | ICD-10-CM | POA: Insufficient documentation

## 2017-05-21 DIAGNOSIS — R51 Headache: Secondary | ICD-10-CM | POA: Insufficient documentation

## 2017-05-21 DIAGNOSIS — O0990 Supervision of high risk pregnancy, unspecified, unspecified trimester: Secondary | ICD-10-CM

## 2017-05-21 DIAGNOSIS — O479 False labor, unspecified: Secondary | ICD-10-CM

## 2017-05-21 DIAGNOSIS — O34219 Maternal care for unspecified type scar from previous cesarean delivery: Secondary | ICD-10-CM

## 2017-05-21 DIAGNOSIS — F419 Anxiety disorder, unspecified: Secondary | ICD-10-CM | POA: Diagnosis not present

## 2017-05-21 DIAGNOSIS — K580 Irritable bowel syndrome with diarrhea: Secondary | ICD-10-CM | POA: Diagnosis present

## 2017-05-21 DIAGNOSIS — O09899 Supervision of other high risk pregnancies, unspecified trimester: Secondary | ICD-10-CM

## 2017-05-21 DIAGNOSIS — Z3A35 35 weeks gestation of pregnancy: Secondary | ICD-10-CM | POA: Insufficient documentation

## 2017-05-21 DIAGNOSIS — J45909 Unspecified asthma, uncomplicated: Secondary | ICD-10-CM | POA: Diagnosis not present

## 2017-05-21 DIAGNOSIS — O4700 False labor before 37 completed weeks of gestation, unspecified trimester: Secondary | ICD-10-CM

## 2017-05-21 DIAGNOSIS — O09219 Supervision of pregnancy with history of pre-term labor, unspecified trimester: Secondary | ICD-10-CM

## 2017-05-21 DIAGNOSIS — O219 Vomiting of pregnancy, unspecified: Secondary | ICD-10-CM

## 2017-05-21 DIAGNOSIS — O24419 Gestational diabetes mellitus in pregnancy, unspecified control: Secondary | ICD-10-CM | POA: Diagnosis not present

## 2017-05-21 DIAGNOSIS — O99343 Other mental disorders complicating pregnancy, third trimester: Secondary | ICD-10-CM | POA: Diagnosis not present

## 2017-05-21 DIAGNOSIS — O99613 Diseases of the digestive system complicating pregnancy, third trimester: Secondary | ICD-10-CM | POA: Insufficient documentation

## 2017-05-21 DIAGNOSIS — O9989 Other specified diseases and conditions complicating pregnancy, childbirth and the puerperium: Secondary | ICD-10-CM | POA: Diagnosis not present

## 2017-05-21 DIAGNOSIS — Z880 Allergy status to penicillin: Secondary | ICD-10-CM | POA: Diagnosis not present

## 2017-05-21 DIAGNOSIS — O212 Late vomiting of pregnancy: Secondary | ICD-10-CM | POA: Insufficient documentation

## 2017-05-21 LAB — COMPREHENSIVE METABOLIC PANEL
ALK PHOS: 124 U/L (ref 38–126)
ALT: 29 U/L (ref 14–54)
AST: 49 U/L — AB (ref 15–41)
Albumin: 2.9 g/dL — ABNORMAL LOW (ref 3.5–5.0)
Anion gap: 6 (ref 5–15)
BILIRUBIN TOTAL: 0.4 mg/dL (ref 0.3–1.2)
BUN: 8 mg/dL (ref 6–20)
CHLORIDE: 107 mmol/L (ref 101–111)
CO2: 22 mmol/L (ref 22–32)
CREATININE: 0.63 mg/dL (ref 0.44–1.00)
Calcium: 8.7 mg/dL — ABNORMAL LOW (ref 8.9–10.3)
GFR calc Af Amer: >60 mL/min (ref >60)
Glucose, Bld: 100 mg/dL — ABNORMAL HIGH (ref 65–99)
Potassium: 4 mmol/L (ref 3.5–5.1)
Sodium: 135 mmol/L (ref 135–145)
Total Protein: 6.3 g/dL — ABNORMAL LOW (ref 6.5–8.1)

## 2017-05-21 LAB — CBC WITH DIFFERENTIAL/PLATELET
BASOS ABS: 0 10*3/uL (ref 0.0–0.1)
Basophils Relative: 0 %
Eosinophils Absolute: 0.1 10*3/uL (ref 0.0–0.7)
Eosinophils Relative: 1 %
HEMATOCRIT: 32.3 % — AB (ref 36.0–46.0)
HEMOGLOBIN: 10.6 g/dL — AB (ref 12.0–15.0)
LYMPHS PCT: 20 %
Lymphs Abs: 1.8 10*3/uL (ref 0.7–4.0)
MCH: 27.4 pg (ref 26.0–34.0)
MCHC: 32.8 g/dL (ref 30.0–36.0)
MCV: 83.5 fL (ref 78.0–100.0)
Monocytes Absolute: 0.3 10*3/uL (ref 0.1–1.0)
Monocytes Relative: 4 %
NEUTROS ABS: 7.1 10*3/uL (ref 1.7–7.7)
Neutrophils Relative %: 75 %
PLATELETS: 233 10*3/uL (ref 150–400)
RBC: 3.87 MIL/uL (ref 3.87–5.11)
RDW: 14.4 % (ref 11.5–15.5)
WBC: 9.3 10*3/uL (ref 4.0–10.5)

## 2017-05-21 LAB — URINALYSIS, ROUTINE W REFLEX MICROSCOPIC
Bilirubin Urine: NEGATIVE
Glucose, UA: NEGATIVE mg/dL
Hgb urine dipstick: NEGATIVE
Ketones, ur: NEGATIVE mg/dL
LEUKOCYTES UA: NEGATIVE
Nitrite: NEGATIVE
PROTEIN: NEGATIVE mg/dL
SPECIFIC GRAVITY, URINE: 1.017 (ref 1.005–1.030)
pH: 6 (ref 5.0–8.0)

## 2017-05-21 LAB — GC/CHLAMYDIA PROBE AMP (~~LOC~~) NOT AT ARMC
Chlamydia: NEGATIVE
NEISSERIA GONORRHEA: NEGATIVE

## 2017-05-21 MED ORDER — LACTATED RINGERS IV BOLUS (SEPSIS)
1000.0000 mL | Freq: Once | INTRAVENOUS | Status: AC
  Administered 2017-05-21: 1000 mL via INTRAVENOUS

## 2017-05-21 MED ORDER — ONDANSETRON 4 MG PO TBDP: 4.0000 mg | ORAL_TABLET | Freq: Four times a day (QID) | ORAL | 0 refills | Status: DC | PRN

## 2017-05-21 NOTE — Discharge Instructions (Signed)

## 2017-05-21 NOTE — MAU Note (Signed)
Pt presents to MAU with complaints of N/V/D, contractions and headache since yesterday. Denies any VB or LOF

## 2017-05-21 NOTE — Telephone Encounter (Signed)
TC from pt c/o watery stools and vomiting - unable to keep foods/beverages down since yesterday. +FM, denies LOF and VB. Pt having also ctx's, she's unsure how far apart. Consulted with provider. Pt to report to MAU for further evaluation.

## 2017-05-21 NOTE — MAU Provider Note (Signed)
History     CSN: [id number]  Arrival date and time: 05/21/17 1611   First Provider Initiated Contact with Patient 05/21/17 1734      Chief Complaint  Patient presents with  . Emesis  . Diarrhea  . Abdominal Pain  . Headache   HPI 36 yo G3P1102 IUP 35 3/7 weeks presents to MAU with c/o N/V and constipation and diarrhea off/on since yesterday. Here last stool this afternoon was hard. Has not felt like eating much today. Ate some solid food earlier this morning and nothing since.   Prenatal care at [facility]. Complicated by GDM, prior c section and desire for BTL Pt for RLTCS/BTL at 39 weeks GDM better controlled since recent change in Diabeta dosage.   She reports good FM, some ut ctx, no vaginal bleeding or LOF. No recent exposure or illness at home. Denies any fever or chills  She does report a H/O IBS and was taken two different medications prior to pregnancy.   Past Medical History:  Diagnosis Date  . Anxiety    no meds  . Asthma    inhaler rarely used - twice year  . Depression    no current Tx.  . Febrile illness, acute 01/2011   Hospitalized for 6 days  . Headache   . Herpes 2008  . Hx of bronchitis 09/2012  . IBS (irritable bowel syndrome)   . Renal stone 08/26/2014  . Seasonal allergies     Past Surgical History:  Procedure Laterality Date  . CESAREAN SECTION  2000 & 2004   "1st one I dilated 1cm, then the 2nd one was a rpt  . CYSTOSCOPY WITH RETROGRADE PYELOGRAM, URETEROSCOPY AND STENT PLACEMENT Left 08/26/2014   Procedure: CYSTOSCOPY WITH RETROGRADE PYELOGRAM, URETEROSCOPY, LASER, STONE EXTRACTION WITH BASKET;  Surgeon: Raynelle Bring, MD;  Location: [facility];  Service: Urology;  Laterality: Left;  . HERNIA REPAIR    . HYSTEROSCOPY N/A 03/24/2013   Procedure: HYSTEROSCOPY / Dilitation and curettage/endometrial curettings;  Surgeon: Lavonia Drafts, MD;  Location: [facility];  Service: Gynecology;  Laterality: N/A;  Diagnostic hysteroscopy  . TMJ ARTHROPLASTY     . WISDOM TOOTH EXTRACTION      Family History  Problem Relation Age of Onset  . Hypertension Mother   . Diabetes Mother     Social History  Substance Use Topics  . Smoking status: Never Smoker  . Smokeless tobacco: Never Used  . Alcohol use No     Comment: before +UPT     Allergies:  Allergies  Allergen Reactions  . Flagyl [Metronidazole] Nausea And Vomiting  . Latex Itching  . Penicillins Hives and Other (See Comments)    Childhood allergy Has patient had a PCN reaction causing immediate rash, facial/tongue/throat swelling, SOB or lightheadedness with hypotension: unknown Has patient had a PCN reaction causing severe rash involving mucus membranes or skin necrosis: unknown Has patient had a PCN reaction that required hospitalization unknown Has patient had a PCN reaction occurring within the last 10 years: no If all of the above answers are "NO", then may proceed with Cephalosporin use.      Prescriptions Prior to Admission  Medication Sig Dispense Refill Last Dose  . acetaminophen (TYLENOL) 500 MG tablet Take 500 mg by mouth every 6 (six) hours as needed for headache.   Taking  . glyBURIDE (DIABETA) 2.5 MG tablet Take 1 tablet (2.5 mg total) by mouth at bedtime. 30 tablet 3 Taking  . pantoprazole (PROTONIX) 20 MG tablet Take 1 tablet (  20 mg total) by mouth daily. 30 tablet 1 Taking  . Prenatal Vit-Fe Fumarate-FA (MULTIVITAMIN-PRENATAL) 27-0.8 MG TABS tablet Take 1 tablet by mouth daily at 12 noon.   Taking  . valACYclovir (VALTREX) 1000 MG tablet Take 0.5 tablets (500 mg total) by mouth daily. (Patient taking differently: Take 500 mg by mouth daily as needed (herpes outbreak). ) 30 tablet 5 Taking    Review of Systems Physical Exam   Blood pressure 124/65, pulse 90, temperature 98.7 F (37.1 C), resp. rate 18, weight 240 lb (108.9 kg), last menstrual period 09/15/2016. Category I FHR tracing Irregular contractions  Physical Exam  Constitutional: She appears  well-developed and well-nourished.  Does not appear ill  Cardiovascular: Normal rate, regular rhythm and normal heart sounds.   Respiratory: Effort normal and breath sounds normal.  GI: Soft. Bowel sounds are normal.  gravid  Genitourinary:  Genitourinary Comments: Cervix closed, vertex    MAU Course  Procedures  MDM Given IL of IV fluids, patient did not have any vomiting while in MAU No cervical change after two hours of evaluation   Results for orders placed or performed during the hospital encounter of 05/21/17 (from the past 24 hour(s))  Urinalysis, Routine w reflex microscopic     Status: Abnormal   Collection Time: 05/21/17  4:45 PM  Result Value Ref Range   Color, Urine YELLOW YELLOW   APPearance HAZY (A) CLEAR   Specific Gravity, Urine 1.017 1.005 - 1.030   pH 6.0 5.0 - 8.0   Glucose, UA NEGATIVE NEGATIVE mg/dL   Hgb urine dipstick NEGATIVE NEGATIVE   Bilirubin Urine NEGATIVE NEGATIVE   Ketones, ur NEGATIVE NEGATIVE mg/dL   Protein, ur NEGATIVE NEGATIVE mg/dL   Nitrite NEGATIVE NEGATIVE   Leukocytes, UA NEGATIVE NEGATIVE  CBC with Differential/Platelet     Status: Abnormal   Collection Time: 05/21/17  5:48 PM  Result Value Ref Range   WBC 9.3 4.0 - 10.5 K/uL   RBC 3.87 3.87 - 5.11 MIL/uL   Hemoglobin 10.6 (L) 12.0 - 15.0 g/dL   HCT 32.3 (L) 36.0 - 46.0 %   MCV 83.5 78.0 - 100.0 fL   MCH 27.4 26.0 - 34.0 pg   MCHC 32.8 30.0 - 36.0 g/dL   RDW 14.4 11.5 - 15.5 %   Platelets 233 150 - 400 K/uL   Neutrophils Relative % 75 %   Neutro Abs 7.1 1.7 - 7.7 K/uL   Lymphocytes Relative 20 %   Lymphs Abs 1.8 0.7 - 4.0 K/uL   Monocytes Relative 4 %   Monocytes Absolute 0.3 0.1 - 1.0 K/uL   Eosinophils Relative 1 %   Eosinophils Absolute 0.1 0.0 - 0.7 K/uL   Basophils Relative 0 %   Basophils Absolute 0.0 0.0 - 0.1 K/uL  Comprehensive metabolic panel     Status: Abnormal   Collection Time: 05/21/17  5:48 PM  Result Value Ref Range   Sodium 135 135 - 145 mmol/L    Potassium 4.0 3.5 - 5.1 mmol/L   Chloride 107 101 - 111 mmol/L   CO2 22 22 - 32 mmol/L   Glucose, Bld 100 (H) 65 - 99 mg/dL   BUN 8 6 - 20 mg/dL   Creatinine, Ser 0.63 0.44 - 1.00 mg/dL   Calcium 8.7 (L) 8.9 - 10.3 mg/dL   Total Protein 6.3 (L) 6.5 - 8.1 g/dL   Albumin 2.9 (L) 3.5 - 5.0 g/dL   AST 49 (H) 15 - 41 U/L   ALT  29 14 - 54 U/L   Alkaline Phosphatase 124 38 - 126 U/L   Total Bilirubin 0.4 0.3 - 1.2 mg/dL   GFR calc non Af Amer >60 >60 mL/min   GFR calc Af Amer >60 >60 mL/min   Anion gap 6 5 - 15    Assessment and Plan  Gastrointestinal symptoms, preterm contractions at [redacted]w[redacted]d No episodes here in MAU Zofran prescribed Told to follow up tomorrow in clinic as scheduled Preterm labor and fetal movement precautions reviewed.   Drs. Arlina Robes and Laray Anger Mikaelah Trostle 05/21/2017, 7:25 PM

## 2017-05-21 NOTE — MAU Note (Signed)
Pt reports watery diarrhea x2 today, then a hard stool after that. Pt has a HX IBS. Denies fever and chills. Pt had a headache this morning, took a tylenol, now feels tired. Pt last ate this morning, some crackers and fruit. Pt has thrown up 2 times. Last incident at 0900. Nausea persisting, but no further vomiting.

## 2017-05-22 ENCOUNTER — Ambulatory Visit (INDEPENDENT_AMBULATORY_CARE_PROVIDER_SITE_OTHER): Payer: Medicaid Other | Admitting: Obstetrics and Gynecology

## 2017-05-22 ENCOUNTER — Other Ambulatory Visit: Payer: Medicaid Other

## 2017-05-22 VITALS — BP 119/66 | HR 88 | Wt 243.5 lb

## 2017-05-22 DIAGNOSIS — O09219 Supervision of pregnancy with history of pre-term labor, unspecified trimester: Secondary | ICD-10-CM

## 2017-05-22 DIAGNOSIS — B009 Herpesviral infection, unspecified: Secondary | ICD-10-CM

## 2017-05-22 DIAGNOSIS — O24419 Gestational diabetes mellitus in pregnancy, unspecified control: Secondary | ICD-10-CM | POA: Diagnosis not present

## 2017-05-22 DIAGNOSIS — O09213 Supervision of pregnancy with history of pre-term labor, third trimester: Secondary | ICD-10-CM

## 2017-05-22 DIAGNOSIS — O0993 Supervision of high risk pregnancy, unspecified, third trimester: Secondary | ICD-10-CM

## 2017-05-22 DIAGNOSIS — O0990 Supervision of high risk pregnancy, unspecified, unspecified trimester: Secondary | ICD-10-CM

## 2017-05-22 DIAGNOSIS — O34219 Maternal care for unspecified type scar from previous cesarean delivery: Secondary | ICD-10-CM

## 2017-05-22 DIAGNOSIS — O09899 Supervision of other high risk pregnancies, unspecified trimester: Secondary | ICD-10-CM

## 2017-05-22 NOTE — Progress Notes (Signed)
   PRENATAL VISIT NOTE  Subjective:  Connie Jenkins is a 36 y.o. G3P1102 at [redacted]w[redacted]d being seen today for ongoing prenatal care.  She is currently monitored for the following issues for this high-risk pregnancy and has CONDYLOMA ACUMINATUM; Previous cesarean section complicating pregnancy, antepartum condition or complication; Supervision of high risk pregnancy, antepartum; History of preterm delivery, currently pregnant; Herpes simplex virus (HSV) infection; Migraines; Gestational diabetes mellitus (GDM) affecting pregnancy; and Unwanted fertility on her problem list.  Patient reports backache.  Contractions: Irritability. Vag. Bleeding: None.  Movement: Present. Denies leaking of fluid.   The following portions of the patient's history were reviewed and updated as appropriate: allergies, current medications, past family history, past medical history, past social history, past surgical history and problem list. Problem list updated.  Objective:   Vitals:   05/22/17 1340  BP: 119/66  Pulse: 88  Weight: 243 lb 8 oz (110.5 kg)    Fetal Status: Fetal Heart Rate (bpm): NST/AFI   Movement: Present  Presentation: Vertex  General:  Alert, oriented and cooperative. Patient is in no acute distress.  Skin: Skin is warm and dry. No rash noted.   Cardiovascular: Normal heart rate noted  Respiratory: Normal respiratory effort, no problems with respiration noted  Abdomen: Soft, gravid, appropriate for gestational age. Pain/Pressure: Present     Pelvic:  Cervical exam deferred        Extremities: Normal range of motion.  Edema: Mild pitting, slight indentation  Mental Status: Normal mood and affect. Normal behavior. Normal judgment and thought content.   Assessment and Plan:  Pregnancy: G3P1102 at [redacted]w[redacted]d  1. Supervision of high risk pregnancy, antepartum Patient is doing well Uncomfortable with this stage of pregnancy and ready for delivery  2. Gestational diabetes mellitus (GDM) affecting  pregnancy CBGs reviewed: postprandial within range. All fasting elevated- Increased glyburide to 5 mg qhS and continue with 2.5 mg in am - Fetal nonstress test; Future- reviewed and reactive with a baseline 140, mod variability, +accels, no decels - Amniotic fluid index: Pt informed that the ultrasound is considered a limited OB ultrasound and is not intended to be a complete ultrasound exam.  Patient also informed that the ultrasound is not being completed with the intent of assessing for fetal or placental anomalies or any pelvic abnormalities.  Explained that the purpose of today's ultrasound is to assess for  presentation and AFI with fetus in cephalic position and AFI 8.96.  Patient acknowledges the purpose of the exam and the limitations of the study.    3. Herpes simplex virus (HSV) infection Continue Valtrex  4. Previous cesarean section complicating pregnancy, antepartum condition or complication Scheduled for repeat at 39 weeks with BTL  5. History of preterm delivery, currently pregnant Patient discontinued weekly 17-P  Preterm labor symptoms and general obstetric precautions including but not limited to vaginal bleeding, contractions, leaking of fluid and fetal movement were reviewed in detail with the patient. Please refer to After Visit Summary for other counseling recommendations.  No Follow-up on file.   Mora Bellman, MD

## 2017-05-24 LAB — STREP GP B SUSCEPTIBILITY

## 2017-05-24 LAB — STREP GP B CULTURE+RFLX: STREP GP B CULTURE+RFLX: POSITIVE — AB

## 2017-05-25 ENCOUNTER — Ambulatory Visit (INDEPENDENT_AMBULATORY_CARE_PROVIDER_SITE_OTHER): Payer: Medicaid Other | Admitting: Certified Nurse Midwife

## 2017-05-25 ENCOUNTER — Other Ambulatory Visit (HOSPITAL_COMMUNITY)
Admission: RE | Admit: 2017-05-25 | Discharge: 2017-05-25 | Disposition: A | Discharge status: Home / Self Care | Payer: Medicaid Other | Source: Ambulatory Visit | Attending: Obstetrics | Admitting: Obstetrics

## 2017-05-25 DIAGNOSIS — O0990 Supervision of high risk pregnancy, unspecified, unspecified trimester: Secondary | ICD-10-CM | POA: Diagnosis not present

## 2017-05-25 DIAGNOSIS — B373 Candidiasis of vulva and vagina: Secondary | ICD-10-CM | POA: Diagnosis not present

## 2017-05-25 DIAGNOSIS — O24419 Gestational diabetes mellitus in pregnancy, unspecified control: Secondary | ICD-10-CM | POA: Diagnosis not present

## 2017-05-25 DIAGNOSIS — O99820 Streptococcus B carrier state complicating pregnancy: Secondary | ICD-10-CM | POA: Diagnosis not present

## 2017-05-25 DIAGNOSIS — O0993 Supervision of high risk pregnancy, unspecified, third trimester: Secondary | ICD-10-CM | POA: Diagnosis not present

## 2017-05-25 DIAGNOSIS — B3731 Acute candidiasis of vulva and vagina: Secondary | ICD-10-CM

## 2017-05-25 MED ORDER — TERCONAZOLE 0.8 % VA CREA: 1.0000 | TOPICAL_CREAM | Freq: Every day | VAGINAL | 0 refills | Status: DC

## 2017-05-25 MED ORDER — FLUCONAZOLE 150 MG PO TABS: 150.0000 mg | ORAL_TABLET | Freq: Once | ORAL | 0 refills | Status: AC

## 2017-05-25 NOTE — Addendum Note (Signed)
Addended by: Tamela Oddi on: 05/25/2017 09:30 AM   Modules accepted: Orders

## 2017-05-25 NOTE — Progress Notes (Signed)
Patient presents for NST. Placed on the Machine.Reactive

## 2017-05-25 NOTE — Progress Notes (Signed)
S; pelvic pressure and vaginal discharge with moderate itching.  Has increased her glyburide.  Is scheduled for repeat C-section  O: Cervix: closed, -3 station, soft, midline.  NST: + accels, no decels, moderate variability, Cat. 1 tracing. No contractions on toco.  + white, thick vaginal discharge NST: + accels, no decels, moderate variability, Cat. 1 tracing. Occasional mild contractions on toco.    A: High risk pregnancy @36  weeks Irregular contractions Vaginitis Reactive NST  P:  Continue antenatal testing Diflucan/terazole for yeast infection Continue glyburide: per patient report not in control.

## 2017-05-29 ENCOUNTER — Other Ambulatory Visit: Payer: Medicaid Other

## 2017-05-29 ENCOUNTER — Other Ambulatory Visit (HOSPITAL_COMMUNITY)
Admission: RE | Admit: 2017-05-29 | Discharge: 2017-05-29 | Disposition: A | Discharge status: Home / Self Care | Payer: Medicaid Other | Source: Ambulatory Visit | Attending: Obstetrics and Gynecology | Admitting: Obstetrics and Gynecology

## 2017-05-29 ENCOUNTER — Other Ambulatory Visit: Payer: Self-pay

## 2017-05-29 ENCOUNTER — Ambulatory Visit (INDEPENDENT_AMBULATORY_CARE_PROVIDER_SITE_OTHER): Payer: Medicaid Other | Admitting: Obstetrics and Gynecology

## 2017-05-29 VITALS — BP 120/72 | HR 98 | Wt 244.0 lb

## 2017-05-29 DIAGNOSIS — O0990 Supervision of high risk pregnancy, unspecified, unspecified trimester: Secondary | ICD-10-CM | POA: Insufficient documentation

## 2017-05-29 DIAGNOSIS — O24415 Gestational diabetes mellitus in pregnancy, controlled by oral hypoglycemic drugs: Secondary | ICD-10-CM

## 2017-05-29 DIAGNOSIS — B009 Herpesviral infection, unspecified: Secondary | ICD-10-CM

## 2017-05-29 DIAGNOSIS — N898 Other specified noninflammatory disorders of vagina: Secondary | ICD-10-CM

## 2017-05-29 DIAGNOSIS — O34219 Maternal care for unspecified type scar from previous cesarean delivery: Secondary | ICD-10-CM

## 2017-05-29 DIAGNOSIS — O99820 Streptococcus B carrier state complicating pregnancy: Secondary | ICD-10-CM

## 2017-05-29 DIAGNOSIS — O09213 Supervision of pregnancy with history of pre-term labor, third trimester: Secondary | ICD-10-CM

## 2017-05-29 DIAGNOSIS — O09219 Supervision of pregnancy with history of pre-term labor, unspecified trimester: Secondary | ICD-10-CM

## 2017-05-29 DIAGNOSIS — Z3009 Encounter for other general counseling and advice on contraception: Secondary | ICD-10-CM

## 2017-05-29 DIAGNOSIS — O09899 Supervision of other high risk pregnancies, unspecified trimester: Secondary | ICD-10-CM

## 2017-05-29 LAB — CERVICOVAGINAL ANCILLARY ONLY
Bacterial vaginitis: NEGATIVE
CHLAMYDIA, DNA PROBE: NEGATIVE
Candida vaginitis: NEGATIVE
Neisseria Gonorrhea: NEGATIVE
Trichomonas: NEGATIVE

## 2017-05-29 LAB — POCT FERNING: Ferning, POC: NEGATIVE

## 2017-05-29 NOTE — Progress Notes (Signed)
Pt states that she noticed liquid running down her leg yesterday. Pt states that she is unsure if it was LOF or sweat. Pt has had an increase in contractions but not regular.

## 2017-05-29 NOTE — Progress Notes (Signed)
Subjective:  Connie Jenkins is a 36 y.o. [phone] at [redacted]w[redacted]d being seen today for ongoing prenatal care.  She is currently monitored for the following issues for this high-risk pregnancy and has CONDYLOMA ACUMINATUM; Previous cesarean section complicating pregnancy, antepartum condition or complication; Supervision of high risk pregnancy, antepartum; History of preterm delivery, currently pregnant; Herpes simplex virus (HSV) infection; Migraines; Gestational diabetes mellitus (GDM) affecting pregnancy; Unwanted fertility; and GBS (group B Streptococcus carrier), +RV culture, currently pregnant on her problem list.  Patient reports reports episode of feeling wet yesterday evening, no further episodes, general discomforts of pregnancy.  Contractions: Irregular. Vag. Bleeding: None.  Movement: Present. Denies leaking of fluid.   The following portions of the patient's history were reviewed and updated as appropriate: allergies, current medications, past family history, past medical history, past social history, past surgical history and problem list. Problem list updated.  Objective:   Vitals:   05/29/17 0923  BP: 120/72  Pulse: 98  Weight: 244 lb (110.7 kg)    Fetal Status: Fetal Heart Rate (bpm): NST/AFI   Movement: Present  Presentation: Vertex  General:  Alert, oriented and cooperative. Patient is in no acute distress.  Skin: Skin is warm and dry. No rash noted.   Cardiovascular: Normal heart rate noted  Respiratory: Normal respiratory effort, no problems with respiration noted  Abdomen: Soft, gravid, appropriate for gestational age. Pain/Pressure: Present     Pelvic:  Cervical exam performed        Extremities: Normal range of motion.  Edema: Mild pitting, slight indentation  Mental Status: Normal mood and affect. Normal behavior. Normal judgment and thought content.   Urinalysis:      Assessment and Plan:  Pregnancy: G3P1102 at [redacted]w[redacted]d  1. Gestational diabetes mellitus (GDM) controlled  on oral hypoglycemic drug, antepartum Pt has not been checking BS regularly or following diet. Importance of checking BS and following diet reviewed with pt Will continue with current dose of Diabeta - Fetal nonstress test, reactive - Amniotic fluid index,10 Continue with twice weekly NST and weekly AFI 2. Supervision of high risk pregnancy, antepartum Fern and Nitrazine negative - Cervicovaginal ancillary only  3. GBS (group B Streptococcus carrier), +RV culture, currently pregnant   4. Previous cesarean section complicating pregnancy, antepartum condition or complication Repeat c section with BTL 06/15/17  5. Unwanted fertility BTL with C section  6. Herpes simplex virus (HSV) infection Continue with Valtrex  7. History of preterm delivery, currently pregnant   8. Vaginal discharge  - POCT Ferning  Preterm labor symptoms and general obstetric precautions including but not limited to vaginal bleeding, contractions, leaking of fluid and fetal movement were reviewed in detail with the patient. Please refer to After Visit Summary for other counseling recommendations.  Return in about 1 week (around 06/05/2017) for OB visit.   Chancy Milroy, MD

## 2017-05-31 LAB — CERVICOVAGINAL ANCILLARY ONLY
BACTERIAL VAGINITIS: NEGATIVE
CANDIDA VAGINITIS: NEGATIVE
CHLAMYDIA, DNA PROBE: NEGATIVE
Neisseria Gonorrhea: NEGATIVE
Trichomonas: NEGATIVE

## 2017-06-01 ENCOUNTER — Other Ambulatory Visit: Payer: Medicaid Other

## 2017-06-05 ENCOUNTER — Ambulatory Visit (INDEPENDENT_AMBULATORY_CARE_PROVIDER_SITE_OTHER): Payer: Medicaid Other | Admitting: Obstetrics & Gynecology

## 2017-06-05 ENCOUNTER — Ambulatory Visit: Payer: Medicaid Other

## 2017-06-05 ENCOUNTER — Other Ambulatory Visit: Payer: Self-pay | Admitting: Obstetrics & Gynecology

## 2017-06-05 ENCOUNTER — Other Ambulatory Visit: Payer: Medicaid Other

## 2017-06-05 VITALS — BP 116/76 | HR 83 | Wt 244.9 lb

## 2017-06-05 DIAGNOSIS — O24415 Gestational diabetes mellitus in pregnancy, controlled by oral hypoglycemic drugs: Secondary | ICD-10-CM | POA: Diagnosis not present

## 2017-06-05 DIAGNOSIS — O24419 Gestational diabetes mellitus in pregnancy, unspecified control: Secondary | ICD-10-CM

## 2017-06-05 DIAGNOSIS — O34219 Maternal care for unspecified type scar from previous cesarean delivery: Secondary | ICD-10-CM

## 2017-06-05 DIAGNOSIS — O0993 Supervision of high risk pregnancy, unspecified, third trimester: Secondary | ICD-10-CM | POA: Diagnosis not present

## 2017-06-05 DIAGNOSIS — B009 Herpesviral infection, unspecified: Secondary | ICD-10-CM

## 2017-06-05 DIAGNOSIS — O0990 Supervision of high risk pregnancy, unspecified, unspecified trimester: Secondary | ICD-10-CM

## 2017-06-05 MED ORDER — GLYBURIDE 2.5 MG PO TABS: 6.2500 mg | ORAL_TABLET | Freq: Every day | ORAL | 3 refills | Status: DC

## 2017-06-05 NOTE — Progress Notes (Signed)
Pt presents for ROB, NST, and AFI. Pt c/o three "gushes" of fluid yesterday. Her pad was soak. Denies regular ctx's.

## 2017-06-05 NOTE — Patient Instructions (Signed)
Return to clinic for any scheduled appointments or obstetric concerns, or go to MAU for evaluation  

## 2017-06-05 NOTE — Progress Notes (Signed)
   PRENATAL VISIT NOTE  Subjective:  Connie Jenkins is a 36 y.o. [phone] at [redacted]w[redacted]d being seen today for ongoing prenatal care.  She is currently monitored for the following issues for this high-risk pregnancy and has CONDYLOMA ACUMINATUM; Previous cesarean section complicating pregnancy, antepartum condition or complication; Supervision of high risk pregnancy, antepartum; History of preterm delivery, currently pregnant; Herpes simplex virus (HSV) infection; Migraines; Gestational diabetes mellitus (GDM) affecting pregnancy; Unwanted fertility; and GBS (group B Streptococcus carrier), +RV culture, currently pregnant on her problem list.  Patient reports no complaints.  Contractions: Irregular. Vag. Bleeding: None.  Movement: Present. Denies leaking of fluid.   The following portions of the patient's history were reviewed and updated as appropriate: allergies, current medications, past family history, past medical history, past social history, past surgical history and problem list. Problem list updated.  Objective:   Vitals:   06/05/17 0840  BP: 116/76  Pulse: 83  Weight: 244 lb 14.4 oz (111.1 kg)    Fetal Status: Fetal Heart Rate (bpm): NST/AFI   Movement: Present  Presentation: Vertex  General:  Alert, oriented and cooperative. Patient is in no acute distress.  Skin: Skin is warm and dry. No rash noted.   Cardiovascular: Normal heart rate noted  Respiratory: Normal respiratory effort, no problems with respiration noted  Abdomen: Soft, gravid, appropriate for gestational age. Pain/Pressure: Present     Pelvic:  Cervical exam performed Dilation: Closed Effacement (%): Thick Station: Ballotable Negative pooling/nitrazine/ferning  Extremities: Normal range of motion.  Edema: Mild pitting, slight indentation  Mental Status: Normal mood and affect. Normal behavior. Normal judgment and thought content.   Assessment and Plan:  Pregnancy: G3P1102 at [redacted]w[redacted]d  1. Gestational diabetes mellitus  (GDM) controlled on oral hypoglycemic drug, antepartum 4/7 fasting BS are in low 100s. Increased Glyburide from 5 mg to 6.25 mg at night. Hypoglycemia precautions advised. 38 week growth scan ordered.  NST performed today was reviewed and was found to be reactive.  AFI was also normal at 7.44 cm, negative evaluation for ROM, will recheck next week.  Continue recommended antenatal testing and prenatal care. - Fetal nonstress test - US OB Limited; Future - [place] MFM OB FOLLOW UP; Future - [place] MFM FETAL BPP WO NON STRESS; Future  2. Herpes simplex virus (HSV) infection Continue suppresion  3. Previous cesarean section complicating pregnancy, antepartum condition or complication Scheduled for RCS and BTS at 39 weeks  4. Supervision of high risk pregnancy, antepartum Term labor symptoms and general obstetric precautions including but not limited to vaginal bleeding, contractions, leaking of fluid and fetal movement were reviewed in detail with the patient. Please refer to After Visit Summary for other counseling recommendations.  Return for As scheduled.   Verita Schneiders, MD

## 2017-06-07 ENCOUNTER — Encounter (HOSPITAL_COMMUNITY): Payer: Self-pay | Admitting: *Deleted

## 2017-06-07 ENCOUNTER — Telehealth (HOSPITAL_COMMUNITY): Payer: Self-pay | Admitting: *Deleted

## 2017-06-07 ENCOUNTER — Inpatient Hospital Stay (HOSPITAL_COMMUNITY)
Admission: AD | Admit: 2017-06-07 | Discharge: 2017-06-07 | Disposition: A | Discharge status: Home / Self Care | Payer: Medicaid Other | Source: Ambulatory Visit | Attending: Obstetrics and Gynecology | Admitting: Obstetrics and Gynecology

## 2017-06-07 DIAGNOSIS — Z3A37 37 weeks gestation of pregnancy: Secondary | ICD-10-CM | POA: Diagnosis not present

## 2017-06-07 DIAGNOSIS — M549 Dorsalgia, unspecified: Secondary | ICD-10-CM | POA: Diagnosis not present

## 2017-06-07 DIAGNOSIS — O99891 Other specified diseases and conditions complicating pregnancy: Secondary | ICD-10-CM

## 2017-06-07 DIAGNOSIS — O26893 Other specified pregnancy related conditions, third trimester: Secondary | ICD-10-CM | POA: Diagnosis not present

## 2017-06-07 DIAGNOSIS — O9989 Other specified diseases and conditions complicating pregnancy, childbirth and the puerperium: Secondary | ICD-10-CM

## 2017-06-07 DIAGNOSIS — Z880 Allergy status to penicillin: Secondary | ICD-10-CM | POA: Diagnosis not present

## 2017-06-07 MED ORDER — CYCLOBENZAPRINE HCL 10 MG PO TABS
10.0000 mg | ORAL_TABLET | Freq: Once | ORAL | Status: AC
  Administered 2017-06-07: 10 mg via ORAL
  Filled 2017-06-07: qty 1

## 2017-06-07 MED ORDER — CYCLOBENZAPRINE HCL 10 MG PO TABS: 10.0000 mg | ORAL_TABLET | Freq: Three times a day (TID) | ORAL | 0 refills | Status: DC | PRN

## 2017-06-07 NOTE — Discharge Instructions (Signed)

## 2017-06-07 NOTE — MAU Note (Signed)
Pt here with c/o back, rectal and vaginal pain. Denies any bleeding or leaking of fluid. Reports fetal movement. Has had 2 prior C/S; scheduled 06/15/17

## 2017-06-07 NOTE — Telephone Encounter (Signed)
Preadmission screen  

## 2017-06-07 NOTE — MAU Provider Note (Signed)
Patient Connie Jenkins is a 36 y.o. [phone] at [redacted]w[redacted]d here with complaints of back pain, rectal pain and pelvic pressure. She denies dysuria, suprapubic pain, N/V or diarrhea. She says that she did not feel this uncomfortable with her last pregnancy; she is here to get checked out.    She denies bleeding, leaking of fluid or decreased fetal movements. She feels like the baby recently dropped and she is having "A lot" of pelvic pressure.   She has a prenatal visit on Friday, July 13.  History     CSN: [id number]  Arrival date and time: 06/07/17 4854   None     Chief Complaint  Patient presents with  . Back Pain  . Pelvic Pain   Back Pain  This is a new problem. The current episode started in the past 7 days. The problem occurs constantly. The pain is present in the lumbar spine. The quality of the pain is described as aching. The pain is at a severity of 10/10. Associated symptoms include pelvic pain.  Pelvic Pain  The patient's primary symptoms include pelvic pain. Associated symptoms include back pain.    OB History    Gravida Para Term Preterm AB Living   3 2 1 1  0 2   SAB TAB Ectopic Multiple Live Births   0       2      Past Medical History:  Diagnosis Date  . Anxiety    no meds  . Asthma    inhaler rarely used - twice year  . Depression    no current Tx.  . Febrile illness, acute 01/2011   Hospitalized for 6 days  . Headache   . Herpes 2008  . Hx of bronchitis 09/2012  . IBS (irritable bowel syndrome)   . Renal stone 08/26/2014  . Seasonal allergies     Past Surgical History:  Procedure Laterality Date  . CESAREAN SECTION  2000 & 2004   "1st one I dilated 1cm, then the 2nd one was a rpt  . CYSTOSCOPY WITH RETROGRADE PYELOGRAM, URETEROSCOPY AND STENT PLACEMENT Left 08/26/2014   Procedure: CYSTOSCOPY WITH RETROGRADE PYELOGRAM, URETEROSCOPY, LASER, STONE EXTRACTION WITH BASKET;  Surgeon: Raynelle Bring, MD;  Location: [facility];  Service: Urology;  Laterality: Left;   . HERNIA REPAIR    . HYSTEROSCOPY N/A 03/24/2013   Procedure: HYSTEROSCOPY / Dilitation and curettage/endometrial curettings;  Surgeon: Lavonia Drafts, MD;  Location: [facility];  Service: Gynecology;  Laterality: N/A;  Diagnostic hysteroscopy  . TMJ ARTHROPLASTY    . WISDOM TOOTH EXTRACTION      Family History  Problem Relation Age of Onset  . Hypertension Mother   . Diabetes Mother     Social History  Substance Use Topics  . Smoking status: Never Smoker  . Smokeless tobacco: Never Used  . Alcohol use No     Comment: before +UPT     Allergies:  Allergies  Allergen Reactions  . Flagyl [Metronidazole] Nausea And Vomiting  . Latex Itching  . Penicillins Hives and Other (See Comments)    Childhood allergy Has patient had a PCN reaction causing immediate rash, facial/tongue/throat swelling, SOB or lightheadedness with hypotension: unknown Has patient had a PCN reaction causing severe rash involving mucus membranes or skin necrosis: unknown Has patient had a PCN reaction that required hospitalization unknown Has patient had a PCN reaction occurring within the last 10 years: no If all of the above answers are "NO", then may proceed with Cephalosporin use.  Prescriptions Prior to Admission  Medication Sig Dispense Refill Last Dose  . acetaminophen (TYLENOL) 500 MG tablet Take 500 mg by mouth every 6 (six) hours as needed for headache.   Taking  . glyBURIDE (DIABETA) 2.5 MG tablet Take 2.5 tablets (6.25 mg total) by mouth at bedtime. 30 tablet 3   . pantoprazole (PROTONIX) 20 MG tablet Take 1 tablet (20 mg total) by mouth daily. 30 tablet 1 Taking  . Prenatal Vit-Fe Fumarate-FA (MULTIVITAMIN-PRENATAL) 27-0.8 MG TABS tablet Take 1 tablet by mouth daily at 12 noon.   Taking  . valACYclovir (VALTREX) 1000 MG tablet Take 0.5 tablets (500 mg total) by mouth daily. (Patient taking differently: Take 500 mg by mouth daily as needed (herpes outbreak). ) 30 tablet 5 Taking     Review of Systems  Respiratory: Negative.   Cardiovascular: Negative.   Gastrointestinal: Positive for rectal pain.  Genitourinary: Positive for pelvic pain.  Musculoskeletal: Positive for back pain.  Neurological: Negative.    Physical Exam   Blood pressure 127/72, pulse 78, temperature 98 F (36.7 C), temperature source Oral, resp. rate 20, height 5\' 3"  (1.6 m), weight 249 lb (112.9 kg), last menstrual period 09/15/2016, SpO2 96 %.  Physical Exam  Constitutional: She is oriented to person, place, and time. She appears well-developed.  HENT:  Head: Normocephalic.  Neck: Normal range of motion.  Respiratory: Effort normal.  GI: Soft.  Musculoskeletal: Normal range of motion.  Neurological: She is alert and oriented to person, place, and time.  Skin: Skin is warm and dry.  Psychiatric: She has a normal mood and affect.  Cervix is closed (per RN exam).    MAU Course  Procedures  MDM 10 mg of flexeril-patient feels better UA pending NST: 130 bpm with mod var, present acel, no decel, no contractions.   Assessment and Plan   1. Back pain affecting pregnancy in third trimester    2. Patient stable for discharge with return precautions and RX for flexeril. Reassured patient of the normalcy of low back pain and pelvic pain and pressure at this point in pregnancy.   3. Patient to keep prenatal visit on 06-08-2017.  4. Patient scheduled for c-section on 06-15-2017.   Mervyn Skeeters Kooistra CNM 06/07/2017, 4:28 AM

## 2017-06-08 ENCOUNTER — Ambulatory Visit (INDEPENDENT_AMBULATORY_CARE_PROVIDER_SITE_OTHER): Payer: Medicaid Other

## 2017-06-08 VITALS — BP 125/79 | HR 94 | Wt 247.0 lb

## 2017-06-08 DIAGNOSIS — O24415 Gestational diabetes mellitus in pregnancy, controlled by oral hypoglycemic drugs: Secondary | ICD-10-CM | POA: Diagnosis not present

## 2017-06-08 NOTE — Progress Notes (Signed)
Nurse visit for NST only dx: GDM. No abn. concerns today per pt. NST-R reviewed with provider. Growth scheduled Monday, ROB scheduled Tues, C/S 7/20 pt agrees.

## 2017-06-08 NOTE — Progress Notes (Signed)
NST Note Date: 06/08/2017 Gestational Age: 34/0 FHT: 145 baseline, positive accelerations, negative deceleration, moderate variability. Change in baseline to 130 and still reactive Toco: quiet Time: 30 minutes  A/P: rNST. Continue current plan of care.   Durene Romans MD Attending Center for [employer])

## 2017-06-11 ENCOUNTER — Encounter (HOSPITAL_COMMUNITY): Payer: Self-pay

## 2017-06-11 ENCOUNTER — Ambulatory Visit (HOSPITAL_COMMUNITY): Payer: Medicaid Other

## 2017-06-12 ENCOUNTER — Other Ambulatory Visit: Payer: Medicaid Other

## 2017-06-12 ENCOUNTER — Ambulatory Visit (INDEPENDENT_AMBULATORY_CARE_PROVIDER_SITE_OTHER): Payer: Medicaid Other | Admitting: Obstetrics and Gynecology

## 2017-06-12 VITALS — BP 114/73 | HR 83 | Wt 251.0 lb

## 2017-06-12 DIAGNOSIS — O99820 Streptococcus B carrier state complicating pregnancy: Secondary | ICD-10-CM | POA: Diagnosis not present

## 2017-06-12 DIAGNOSIS — O34219 Maternal care for unspecified type scar from previous cesarean delivery: Secondary | ICD-10-CM

## 2017-06-12 DIAGNOSIS — Z3009 Encounter for other general counseling and advice on contraception: Secondary | ICD-10-CM | POA: Diagnosis not present

## 2017-06-12 DIAGNOSIS — B009 Herpesviral infection, unspecified: Secondary | ICD-10-CM

## 2017-06-12 DIAGNOSIS — O24419 Gestational diabetes mellitus in pregnancy, unspecified control: Secondary | ICD-10-CM | POA: Diagnosis not present

## 2017-06-12 DIAGNOSIS — O09213 Supervision of pregnancy with history of pre-term labor, third trimester: Secondary | ICD-10-CM

## 2017-06-12 DIAGNOSIS — O09219 Supervision of pregnancy with history of pre-term labor, unspecified trimester: Secondary | ICD-10-CM

## 2017-06-12 DIAGNOSIS — O09899 Supervision of other high risk pregnancies, unspecified trimester: Secondary | ICD-10-CM

## 2017-06-12 DIAGNOSIS — O0990 Supervision of high risk pregnancy, unspecified, unspecified trimester: Secondary | ICD-10-CM

## 2017-06-12 NOTE — Progress Notes (Signed)
Subjective:  Connie Jenkins is a 37 y.o. [phone] at [redacted]w[redacted]d being seen today for ongoing prenatal care.  She is currently monitored for the following issues for this high-risk pregnancy and has CONDYLOMA ACUMINATUM; Previous cesarean section complicating pregnancy, antepartum condition or complication; Supervision of high risk pregnancy, antepartum; History of preterm delivery, currently pregnant; Herpes simplex virus (HSV) infection; Migraines; Gestational diabetes mellitus (GDM) affecting pregnancy; Unwanted fertility; and GBS (group B Streptococcus carrier), +RV culture, currently pregnant on her problem list.  Patient reports no complaints.  Contractions: Regular.  .  Movement: Present. Denies leaking of fluid.   The following portions of the patient's history were reviewed and updated as appropriate: allergies, current medications, past family history, past medical history, past social history, past surgical history and problem list. Problem list updated.  Objective:   Vitals:   06/12/17 0848  BP: 114/73  Pulse: 83  Weight: 251 lb (113.9 kg)    Fetal Status:     Movement: Present     General:  Alert, oriented and cooperative. Patient is in no acute distress.  Skin: Skin is warm and dry. No rash noted.   Cardiovascular: Normal heart rate noted  Respiratory: Normal respiratory effort, no problems with respiration noted  Abdomen: Soft, gravid, appropriate for gestational age. Pain/Pressure: Present     Pelvic:  Cervical exam deferred        Extremities: Normal range of motion.  Edema: Mild pitting, slight indentation  Mental Status: Normal mood and affect. Normal behavior. Normal judgment and thought content.   Urinalysis:      Assessment and Plan:  Pregnancy: G3P1102 at [redacted]w[redacted]d  1. GBS (group B Streptococcus carrier), +RV culture, currently pregnant Tx while in labor  2. Unwanted fertility BTL  3. Gestational diabetes mellitus (GDM) affecting pregnancy Has stopped medications  and reports all BS in goal range but did not bring readings Reactive NST today 4. Herpes simplex virus (HSV) infection Continue with suppression  5. History of preterm delivery, currently pregnant   6. Supervision of high risk pregnancy, antepartum Labor precautions  7. Previous cesarean section complicating pregnancy, antepartum condition or complication For repeat c section /btl this Friday  Term labor symptoms and general obstetric precautions including but not limited to vaginal bleeding, contractions, leaking of fluid and fetal movement were reviewed in detail with the patient. Please refer to After Visit Summary for other counseling recommendations.  Return in about 4 weeks (around 07/10/2017).   Chancy Milroy, MD

## 2017-06-12 NOTE — Patient Instructions (Signed)

## 2017-06-12 NOTE — Progress Notes (Signed)
114/73 83 

## 2017-06-14 ENCOUNTER — Encounter (HOSPITAL_COMMUNITY)
Admission: RE | Admit: 2017-06-14 | Discharge: 2017-06-14 | Disposition: A | Discharge status: Home / Self Care | Payer: Medicaid Other | Source: Ambulatory Visit | Attending: Obstetrics & Gynecology | Admitting: Obstetrics & Gynecology

## 2017-06-14 HISTORY — DX: Gestational diabetes mellitus in pregnancy, unspecified control: O24.419

## 2017-06-14 LAB — CBC
HEMATOCRIT: 31.7 % — AB (ref 36.0–46.0)
HEMOGLOBIN: 10.4 g/dL — AB (ref 12.0–15.0)
MCH: 27.4 pg (ref 26.0–34.0)
MCHC: 32.8 g/dL (ref 30.0–36.0)
MCV: 83.6 fL (ref 78.0–100.0)
Platelets: 208 10*3/uL (ref 150–400)
RBC: 3.79 MIL/uL — AB (ref 3.87–5.11)
RDW: 14.8 % (ref 11.5–15.5)
WBC: 8.6 10*3/uL (ref 4.0–10.5)

## 2017-06-14 LAB — TYPE AND SCREEN
ABO/RH(D): A POS
ANTIBODY SCREEN: NEGATIVE

## 2017-06-14 NOTE — Patient Instructions (Signed)
[name]  06/14/2017   Your procedure is scheduled on:  06/15/2017  Enter through the Main Entrance of [facility] at [place] up the phone at the desk and dial [phone]..   Call this number if you have problems the morning of surgery: [phone]   Remember:   Do not eat food:After Midnight.  Do not drink clear liquids: After Midnight.  Take these medicines the morning of surgery with A SIP OF WATER: none   Do not wear jewelry, make-up or nail polish.  Do not wear lotions, powders, or perfumes. Do not wear deodorant.  Do not shave 48 hours prior to surgery.  Do not bring valuables to the hospital.  [facility] is not   responsible for any belongings or valuables brought to the hospital.  Contacts, dentures or bridgework may not be worn into surgery.  Leave suitcase in the car. After surgery it may be brought to your room.  For patients admitted to the hospital, checkout time is 11:00 AM the day of              discharge.   Patients discharged the day of surgery will not be allowed to drive             home.  Name and phone number of your driver: na  Special Instructions:   N/A   Please read over the following fact sheets that you were given:   Surgical Site Infection Prevention

## 2017-06-15 ENCOUNTER — Encounter (HOSPITAL_COMMUNITY): Payer: Self-pay | Admitting: *Deleted

## 2017-06-15 ENCOUNTER — Inpatient Hospital Stay (HOSPITAL_COMMUNITY): Payer: Medicaid Other | Admitting: Anesthesiology

## 2017-06-15 ENCOUNTER — Inpatient Hospital Stay (HOSPITAL_COMMUNITY)
Admission: AD | Admit: 2017-06-15 | Discharge: 2017-06-18 | DRG: 765 | Disposition: A | Discharge status: Home / Self Care | Payer: Medicaid Other | Source: Ambulatory Visit | Attending: Obstetrics & Gynecology | Admitting: Obstetrics & Gynecology

## 2017-06-15 ENCOUNTER — Encounter (HOSPITAL_COMMUNITY)
Admission: AD | Disposition: A | Discharge status: Home / Self Care | Payer: Self-pay | Source: Ambulatory Visit | Attending: Obstetrics & Gynecology

## 2017-06-15 DIAGNOSIS — O24429 Gestational diabetes mellitus in childbirth, unspecified control: Secondary | ICD-10-CM

## 2017-06-15 DIAGNOSIS — O34211 Maternal care for low transverse scar from previous cesarean delivery: Secondary | ICD-10-CM

## 2017-06-15 DIAGNOSIS — O09219 Supervision of pregnancy with history of pre-term labor, unspecified trimester: Secondary | ICD-10-CM

## 2017-06-15 DIAGNOSIS — Z98891 History of uterine scar from previous surgery: Secondary | ICD-10-CM

## 2017-06-15 DIAGNOSIS — O0990 Supervision of high risk pregnancy, unspecified, unspecified trimester: Secondary | ICD-10-CM

## 2017-06-15 DIAGNOSIS — O34219 Maternal care for unspecified type scar from previous cesarean delivery: Secondary | ICD-10-CM

## 2017-06-15 DIAGNOSIS — Z3A39 39 weeks gestation of pregnancy: Secondary | ICD-10-CM

## 2017-06-15 DIAGNOSIS — O24425 Gestational diabetes mellitus in childbirth, controlled by oral hypoglycemic drugs: Secondary | ICD-10-CM | POA: Diagnosis not present

## 2017-06-15 DIAGNOSIS — O99824 Streptococcus B carrier state complicating childbirth: Secondary | ICD-10-CM | POA: Diagnosis not present

## 2017-06-15 DIAGNOSIS — A6000 Herpesviral infection of urogenital system, unspecified: Secondary | ICD-10-CM | POA: Diagnosis not present

## 2017-06-15 DIAGNOSIS — Z880 Allergy status to penicillin: Secondary | ICD-10-CM

## 2017-06-15 DIAGNOSIS — O9832 Other infections with a predominantly sexual mode of transmission complicating childbirth: Secondary | ICD-10-CM | POA: Diagnosis not present

## 2017-06-15 DIAGNOSIS — B951 Streptococcus, group B, as the cause of diseases classified elsewhere: Secondary | ICD-10-CM

## 2017-06-15 DIAGNOSIS — O9882 Other maternal infectious and parasitic diseases complicating childbirth: Secondary | ICD-10-CM

## 2017-06-15 DIAGNOSIS — B009 Herpesviral infection, unspecified: Secondary | ICD-10-CM | POA: Diagnosis present

## 2017-06-15 DIAGNOSIS — O09899 Supervision of other high risk pregnancies, unspecified trimester: Secondary | ICD-10-CM

## 2017-06-15 LAB — GLUCOSE, CAPILLARY
GLUCOSE-CAPILLARY: 93 mg/dL (ref 65–99)
Glucose-Capillary: 98 mg/dL (ref 65–99)

## 2017-06-15 LAB — RPR: RPR Ser Ql: NONREACTIVE

## 2017-06-15 SURGERY — Surgical Case
Anesthesia: Monitor Anesthesia Care | Site: Abdomen | Wound class: Clean Contaminated

## 2017-06-15 MED ORDER — TETANUS-DIPHTH-ACELL PERTUSSIS 5-2.5-18.5 LF-MCG/0.5 IM SUSP: 0.5000 mL | Freq: Once | INTRAMUSCULAR | Status: DC

## 2017-06-15 MED ORDER — LACTATED RINGERS IV SOLN
INTRAVENOUS | Status: DC
  Administered 2017-06-15 (×3): via INTRAVENOUS

## 2017-06-15 MED ORDER — OXYTOCIN 10 UNIT/ML IJ SOLN
INTRAVENOUS | Status: DC | PRN
  Administered 2017-06-15: 40 [IU] via INTRAVENOUS

## 2017-06-15 MED ORDER — SCOPOLAMINE 1 MG/3DAYS TD PT72
MEDICATED_PATCH | TRANSDERMAL | Status: AC
Start: 2017-06-15 — End: ?
  Filled 2017-06-15: qty 1

## 2017-06-15 MED ORDER — DIPHENHYDRAMINE HCL 25 MG PO CAPS
25.0000 mg | ORAL_CAPSULE | Freq: Four times a day (QID) | ORAL | Status: DC | PRN
  Filled 2017-06-15: qty 1

## 2017-06-15 MED ORDER — ZOLPIDEM TARTRATE 5 MG PO TABS: 5.0000 mg | ORAL_TABLET | Freq: Every evening | ORAL | Status: DC | PRN

## 2017-06-15 MED ORDER — DIPHENHYDRAMINE HCL 50 MG/ML IJ SOLN
12.5000 mg | INTRAMUSCULAR | Status: DC | PRN
  Administered 2017-06-15: 12.5 mg via INTRAVENOUS
  Filled 2017-06-15: qty 1

## 2017-06-15 MED ORDER — PHENYLEPHRINE 8 MG IN D5W 100 ML (0.08MG/ML) PREMIX OPTIME
INJECTION | INTRAVENOUS | Status: AC
  Filled 2017-06-15: qty 100

## 2017-06-15 MED ORDER — ONDANSETRON HCL 4 MG/2ML IJ SOLN
INTRAMUSCULAR | Status: AC
  Filled 2017-06-15: qty 2

## 2017-06-15 MED ORDER — PROMETHAZINE HCL 25 MG/ML IJ SOLN
6.2500 mg | INTRAMUSCULAR | Status: DC | PRN
Start: 2017-06-15 — End: 2017-06-15

## 2017-06-15 MED ORDER — MEPERIDINE HCL 25 MG/ML IJ SOLN
6.2500 mg | INTRAMUSCULAR | Status: DC | PRN
  Administered 2017-06-15: 6.25 mg via INTRAVENOUS

## 2017-06-15 MED ORDER — SIMETHICONE 80 MG PO CHEW
80.0000 mg | CHEWABLE_TABLET | ORAL | Status: DC
  Administered 2017-06-16 – 2017-06-18 (×3): 80 mg via ORAL
  Filled 2017-06-15 (×3): qty 1

## 2017-06-15 MED ORDER — HYDROMORPHONE HCL 1 MG/ML IJ SOLN
INTRAMUSCULAR | Status: AC
  Filled 2017-06-15: qty 0.5

## 2017-06-15 MED ORDER — FENTANYL CITRATE (PF) 100 MCG/2ML IJ SOLN
INTRAMUSCULAR | Status: DC | PRN
  Administered 2017-06-15: 10 ug via INTRAVENOUS

## 2017-06-15 MED ORDER — DEXAMETHASONE SODIUM PHOSPHATE 10 MG/ML IJ SOLN
INTRAMUSCULAR | Status: AC
  Filled 2017-06-15: qty 1

## 2017-06-15 MED ORDER — NALOXONE HCL 2 MG/2ML IJ SOSY
1.0000 ug/kg/h | PREFILLED_SYRINGE | INTRAMUSCULAR | Status: DC | PRN
  Filled 2017-06-15: qty 2

## 2017-06-15 MED ORDER — MEPERIDINE HCL 25 MG/ML IJ SOLN
INTRAMUSCULAR | Status: AC
  Filled 2017-06-15: qty 1

## 2017-06-15 MED ORDER — SIMETHICONE 80 MG PO CHEW
80.0000 mg | CHEWABLE_TABLET | ORAL | Status: DC | PRN
  Administered 2017-06-15 – 2017-06-17 (×3): 80 mg via ORAL
  Filled 2017-06-15 (×2): qty 1

## 2017-06-15 MED ORDER — WITCH HAZEL-GLYCERIN EX PADS: 1.0000 "application " | MEDICATED_PAD | CUTANEOUS | Status: DC | PRN

## 2017-06-15 MED ORDER — SCOPOLAMINE 1 MG/3DAYS TD PT72
MEDICATED_PATCH | TRANSDERMAL | Status: DC | PRN
  Administered 2017-06-15: 1 via TRANSDERMAL

## 2017-06-15 MED ORDER — FENTANYL CITRATE (PF) 100 MCG/2ML IJ SOLN
INTRAMUSCULAR | Status: AC
  Filled 2017-06-15: qty 2

## 2017-06-15 MED ORDER — OXYCODONE-ACETAMINOPHEN 5-325 MG PO TABS
1.0000 | ORAL_TABLET | ORAL | Status: DC | PRN
  Administered 2017-06-16 – 2017-06-17 (×4): 1 via ORAL
  Filled 2017-06-15 (×5): qty 1

## 2017-06-15 MED ORDER — LACTATED RINGERS IV SOLN
INTRAVENOUS | Status: DC | PRN
  Administered 2017-06-15 (×2): via INTRAVENOUS

## 2017-06-15 MED ORDER — BUPIVACAINE IN DEXTROSE 0.75-8.25 % IT SOLN
INTRATHECAL | Status: DC | PRN
  Administered 2017-06-15: 1.8 mL via INTRATHECAL

## 2017-06-15 MED ORDER — BUPIVACAINE IN DEXTROSE 0.75-8.25 % IT SOLN
INTRATHECAL | Status: AC
  Filled 2017-06-15: qty 2

## 2017-06-15 MED ORDER — OXYTOCIN 40 UNITS IN LACTATED RINGERS INFUSION - SIMPLE MED: 2.5000 [IU]/h | INTRAVENOUS | Status: AC

## 2017-06-15 MED ORDER — SENNOSIDES-DOCUSATE SODIUM 8.6-50 MG PO TABS
2.0000 | ORAL_TABLET | ORAL | Status: DC
  Administered 2017-06-16 – 2017-06-18 (×3): 2 via ORAL
  Filled 2017-06-15 (×3): qty 2

## 2017-06-15 MED ORDER — GENTAMICIN SULFATE 40 MG/ML IJ SOLN
INTRAMUSCULAR | Status: AC
  Administered 2017-06-15: 100 mL via INTRAVENOUS
  Filled 2017-06-15: qty 9.75

## 2017-06-15 MED ORDER — NALBUPHINE SYRINGE 5 MG/0.5 ML: 5.0000 mg | INJECTION | INTRAMUSCULAR | Status: DC | PRN

## 2017-06-15 MED ORDER — BUPIVACAINE HCL (PF) 0.5 % IJ SOLN
INTRAMUSCULAR | Status: DC | PRN
  Administered 2017-06-15: 30 mL

## 2017-06-15 MED ORDER — PRENATAL MULTIVITAMIN CH
1.0000 | ORAL_TABLET | Freq: Every day | ORAL | Status: DC
  Administered 2017-06-16 – 2017-06-17 (×2): 1 via ORAL
  Filled 2017-06-15 (×2): qty 1

## 2017-06-15 MED ORDER — MEASLES, MUMPS & RUBELLA VAC ~~LOC~~ INJ
0.5000 mL | INJECTION | Freq: Once | SUBCUTANEOUS | Status: DC
  Filled 2017-06-15: qty 0.5

## 2017-06-15 MED ORDER — BUPIVACAINE HCL (PF) 0.5 % IJ SOLN
INTRAMUSCULAR | Status: AC
  Filled 2017-06-15: qty 30

## 2017-06-15 MED ORDER — MENTHOL 3 MG MT LOZG: 1.0000 | LOZENGE | OROMUCOSAL | Status: DC | PRN

## 2017-06-15 MED ORDER — DEXAMETHASONE SODIUM PHOSPHATE 10 MG/ML IJ SOLN
INTRAMUSCULAR | Status: DC | PRN
  Administered 2017-06-15: 10 mg via INTRAVENOUS

## 2017-06-15 MED ORDER — FERROUS SULFATE 325 (65 FE) MG PO TABS
325.0000 mg | ORAL_TABLET | Freq: Two times a day (BID) | ORAL | Status: DC
  Administered 2017-06-16 – 2017-06-18 (×4): 325 mg via ORAL
  Filled 2017-06-15 (×5): qty 1

## 2017-06-15 MED ORDER — NALBUPHINE SYRINGE 5 MG/0.5 ML: 5.0000 mg | INJECTION | Freq: Once | INTRAMUSCULAR | Status: DC | PRN

## 2017-06-15 MED ORDER — OXYCODONE-ACETAMINOPHEN 5-325 MG PO TABS
2.0000 | ORAL_TABLET | ORAL | Status: DC | PRN
  Administered 2017-06-15 (×2): 2 via ORAL
  Filled 2017-06-15 (×2): qty 2

## 2017-06-15 MED ORDER — MAGNESIUM HYDROXIDE 400 MG/5ML PO SUSP: 30.0000 mL | ORAL | Status: DC | PRN

## 2017-06-15 MED ORDER — NALOXONE HCL 0.4 MG/ML IJ SOLN: 0.4000 mg | INTRAMUSCULAR | Status: DC | PRN

## 2017-06-15 MED ORDER — SODIUM CHLORIDE 0.9% FLUSH: 3.0000 mL | INTRAVENOUS | Status: DC | PRN

## 2017-06-15 MED ORDER — SCOPOLAMINE 1 MG/3DAYS TD PT72: 1.0000 | MEDICATED_PATCH | Freq: Once | TRANSDERMAL | Status: DC

## 2017-06-15 MED ORDER — DIBUCAINE 1 % RE OINT: 1.0000 "application " | TOPICAL_OINTMENT | RECTAL | Status: DC | PRN

## 2017-06-15 MED ORDER — HYDROMORPHONE HCL 1 MG/ML IJ SOLN
0.2500 mg | INTRAMUSCULAR | Status: DC | PRN
  Administered 2017-06-15: 0.5 mg via INTRAVENOUS

## 2017-06-15 MED ORDER — PHENYLEPHRINE 8 MG IN D5W 100 ML (0.08MG/ML) PREMIX OPTIME
INJECTION | INTRAVENOUS | Status: DC | PRN
  Administered 2017-06-15: 60 ug/min via INTRAVENOUS

## 2017-06-15 MED ORDER — DIPHENHYDRAMINE HCL 25 MG PO CAPS
25.0000 mg | ORAL_CAPSULE | ORAL | Status: DC | PRN
  Filled 2017-06-15: qty 1

## 2017-06-15 MED ORDER — OXYTOCIN 10 UNIT/ML IJ SOLN
INTRAMUSCULAR | Status: AC
  Filled 2017-06-15: qty 4

## 2017-06-15 MED ORDER — MORPHINE SULFATE (PF) 0.5 MG/ML IJ SOLN
INTRAMUSCULAR | Status: AC
  Filled 2017-06-15: qty 10

## 2017-06-15 MED ORDER — ONDANSETRON HCL 4 MG/2ML IJ SOLN
INTRAMUSCULAR | Status: DC | PRN
  Administered 2017-06-15: 4 mg via INTRAVENOUS

## 2017-06-15 MED ORDER — SODIUM CHLORIDE 0.9 % IR SOLN
Status: DC | PRN
  Administered 2017-06-15: 1000 mL

## 2017-06-15 MED ORDER — COCONUT OIL OIL: 1.0000 "application " | TOPICAL_OIL | Status: DC | PRN

## 2017-06-15 MED ORDER — LACTATED RINGERS IV SOLN: INTRAVENOUS | Status: DC

## 2017-06-15 MED ORDER — ACETAMINOPHEN 325 MG PO TABS
650.0000 mg | ORAL_TABLET | ORAL | Status: DC | PRN
  Administered 2017-06-15: 650 mg via ORAL
  Filled 2017-06-15: qty 2

## 2017-06-15 MED ORDER — IBUPROFEN 600 MG PO TABS
600.0000 mg | ORAL_TABLET | Freq: Four times a day (QID) | ORAL | Status: DC
  Administered 2017-06-15 – 2017-06-18 (×10): 600 mg via ORAL
  Filled 2017-06-15 (×12): qty 1

## 2017-06-15 MED ORDER — ONDANSETRON HCL 4 MG/2ML IJ SOLN: 4.0000 mg | Freq: Three times a day (TID) | INTRAMUSCULAR | Status: DC | PRN

## 2017-06-15 SURGICAL SUPPLY — 31 items
APL SKNCLS STERI-STRIP NONHPOA (GAUZE/BANDAGES/DRESSINGS) ×1
BENZOIN TINCTURE PRP APPL 2/3 (GAUZE/BANDAGES/DRESSINGS) ×1 IMPLANT
CHLORAPREP W/TINT 26ML (MISCELLANEOUS) ×2 IMPLANT
CLAMP CORD UMBIL (MISCELLANEOUS) ×1 IMPLANT
CLOTH BEACON ORANGE TIMEOUT ST (SAFETY) ×2 IMPLANT
DRSG OPSITE POSTOP 4X10 (GAUZE/BANDAGES/DRESSINGS) ×2 IMPLANT
ELECT REM PT RETURN 9FT ADLT (ELECTROSURGICAL) ×2
ELECTRODE REM PT RTRN 9FT ADLT (ELECTROSURGICAL) ×1 IMPLANT
GLOVE BIOGEL PI IND STRL 7.0 (GLOVE) ×3 IMPLANT
GLOVE BIOGEL PI INDICATOR 7.0 (GLOVE) ×3
GLOVE ECLIPSE 7.0 STRL STRAW (GLOVE) ×2 IMPLANT
GOWN STRL REUS W/TWL LRG LVL3 (GOWN DISPOSABLE) ×4 IMPLANT
NDL HYPO 25X5/8 SAFETYGLIDE (NEEDLE) ×1 IMPLANT
NEEDLE HYPO 22GX1.5 SAFETY (NEEDLE) ×2 IMPLANT
NEEDLE HYPO 25X5/8 SAFETYGLIDE (NEEDLE) ×2 IMPLANT
NS IRRIG 1000ML POUR BTL (IV SOLUTION) ×2 IMPLANT
PACK C SECTION WH (CUSTOM PROCEDURE TRAY) ×2 IMPLANT
PAD ABD 7.5X8 STRL (GAUZE/BANDAGES/DRESSINGS) ×2 IMPLANT
PAD OB MATERNITY 4.3X12.25 (PERSONAL CARE ITEMS) ×2 IMPLANT
PENCIL SMOKE EVAC W/HOLSTER (ELECTROSURGICAL) ×2 IMPLANT
RTRCTR C-SECT PINK 25CM LRG (MISCELLANEOUS) ×1 IMPLANT
SPONGE GAUZE 4X4 12PLY STER LF (GAUZE/BANDAGES/DRESSINGS) ×2 IMPLANT
STRIP CLOSURE SKIN 1/2X4 (GAUZE/BANDAGES/DRESSINGS) ×1 IMPLANT
SUT PDS AB 0 CTX 36 PDP370T (SUTURE) ×1 IMPLANT
SUT VIC AB 0 CTX 36 (SUTURE) ×4
SUT VIC AB 0 CTX36XBRD ANBCTRL (SUTURE) ×2 IMPLANT
SUT VIC AB 4-0 KS 27 (SUTURE) ×2 IMPLANT
SYR CONTROL 10ML LL (SYRINGE) ×2 IMPLANT
TAPE CLOTH SURG 4X10 WHT LF (GAUZE/BANDAGES/DRESSINGS) ×1 IMPLANT
TOWEL OR 17X24 6PK STRL BLUE (TOWEL DISPOSABLE) ×2 IMPLANT
TRAY FOLEY BAG SILVER LF 14FR (SET/KITS/TRAYS/PACK) ×2 IMPLANT

## 2017-06-15 NOTE — Addendum Note (Signed)
Addendum  created 06/15/17 1843 by Hewitt Blade, CRNA   Sign clinical note

## 2017-06-15 NOTE — Anesthesia Postprocedure Evaluation (Signed)
Anesthesia Post Note  Patient: Connie Jenkins  Procedure(s) Performed: Procedure(s) (LRB): CESAREAN SECTION (N/A)     Patient location during evaluation: Mother Baby Anesthesia Type: Combined Spinal/Epidural Level of consciousness: awake and alert and oriented Pain management: pain level controlled Vital Signs Assessment: post-procedure vital signs reviewed and stable Respiratory status: spontaneous breathing and nonlabored ventilation Cardiovascular status: stable Postop Assessment: no headache, patient able to bend at knees, no backache, no signs of nausea or vomiting, epidural receding and adequate PO intake Anesthetic complications: no    Last Vitals:  Vitals:   06/15/17 1445 06/15/17 1545  BP: (!) 146/77 (!) 148/86  Pulse: 62 68  Resp: 18 18  Temp: 36.8 C 36.7 C    Last Pain:  Vitals:   06/15/17 1645  TempSrc:   PainSc: 7    Pain Goal: Patients Stated Pain Goal: 3 (06/15/17 1645)               Dillie Burandt Hristova

## 2017-06-15 NOTE — Transfer of Care (Signed)
Immediate Anesthesia Transfer of Care Note  Patient: Connie Jenkins  Procedure(s) Performed: Procedure(s): CESAREAN SECTION (N/A)  Patient Location: PACU  Anesthesia Type:Spinal and Epidural  Level of Consciousness: awake, alert  and oriented  Airway & Oxygen Therapy: Patient Spontanous Breathing  Post-op Assessment: Report given to RN and Post -op Vital signs reviewed and stable  Post vital signs: Reviewed and stable  Last Vitals:  Vitals:   06/15/17 0745  BP: (!) 141/81  Pulse: 75  Resp: 18  Temp: 36.9 C    Last Pain:  Vitals:   06/15/17 0745  TempSrc: Oral  PainSc: 0-No pain         Complications: No apparent anesthesia complications

## 2017-06-15 NOTE — Anesthesia Procedure Notes (Addendum)
Spinal  Patient location during procedure: OR Start time: 06/15/2017 9:30 AM End time: 06/15/2017 9:32 AM Staffing Anesthesiologist: Duane Boston Performed: resident/CRNA  Preanesthetic Checklist Completed: patient identified, site marked, surgical consent, pre-op evaluation, timeout performed, IV checked, risks and benefits discussed and monitors and equipment checked Spinal Block Patient position: sitting Prep: DuraPrep Patient monitoring: heart rate, continuous pulse ox and blood pressure Approach: midline Location: L3-4 Injection technique: catheter Needle Needle type: Tuohy and Sprotte  Needle gauge: 24 G Needle length: 12.7 cm Catheter type: closed end flexible Catheter size: 19 g

## 2017-06-15 NOTE — Anesthesia Preprocedure Evaluation (Addendum)
Anesthesia Evaluation  Patient identified by MRN, date of birth, ID band Patient awake    Reviewed: Allergy & Precautions, H&P , NPO status , Patient's Chart, lab work & pertinent test results  History of Anesthesia Complications Negative for: history of anesthetic complications  Airway Mallampati: II  TM Distance: >3 FB Neck ROM: full    Dental no notable dental hx. (+) Teeth Intact, Dental Advisory Given   Pulmonary asthma ,  Mild asthma   Pulmonary exam normal breath sounds clear to auscultation       Cardiovascular Exercise Tolerance: Good negative cardio ROS Normal cardiovascular exam Rhythm:regular Rate:Normal     Neuro/Psych  Headaches, PSYCHIATRIC DISORDERS Anxiety Depression    GI/Hepatic negative GI ROS, Neg liver ROS,   Endo/Other  negative endocrine ROSdiabetes  Renal/GU negative Renal ROS     Musculoskeletal   Abdominal   Peds  Hematology negative hematology ROS (+)   Anesthesia Other Findings    Reproductive/Obstetrics negative OB ROS                            Anesthesia Physical  Anesthesia Plan  ASA: III  Anesthesia Plan: MAC and Spinal   Post-op Pain Management:    Induction:   PONV Risk Score and Plan:   Airway Management Planned: Natural Airway  Additional Equipment:   Intra-op Plan:   Post-operative Plan:   Informed Consent: I have reviewed the patients History and Physical, chart, labs and discussed the procedure including the risks, benefits and alternatives for the proposed anesthesia with the patient or authorized representative who has indicated his/her understanding and acceptance.   Dental advisory given  Plan Discussed with: Surgeon, Anesthesiologist and CRNA  Anesthesia Plan Comments: (C/O itching with last C/S (spinal) will reduce intrathecal narcotics. )       Anesthesia Quick Evaluation

## 2017-06-15 NOTE — Op Note (Signed)
Connie Jenkins PROCEDURE DATE: 06/15/2017  PREOPERATIVE DIAGNOSES: Intrauterine pregnancy at [redacted]w[redacted]d weeks gestation; A2GDM; two previous cesarean sections  POSTOPERATIVE DIAGNOSES: The same  PROCEDURE: Repeat Low Transverse Cesarean Section  SURGEON:  Dr. Verita Schneiders  ASSISTANTS:  Dr. Katherine Basset, Dr. Luiz Blare  ANESTHESIOLOGY TEAM: Anesthesiologist: Duane Boston, MD CRNA: Rayvon Char, CRNA  INDICATIONS: Connie Jenkins is a 36 y.o. [phone] at [redacted]w[redacted]d here for cesarean section secondary to the indications listed under preoperative diagnoses; please see preoperative note for further details.  The risks of cesarean section were discussed with the patient including but were not limited to: bleeding which may require transfusion or reoperation; infection which may require antibiotics; injury to bowel, bladder, ureters or other surrounding organs; injury to the fetus; need for additional procedures including hysterectomy in the event of a life-threatening hemorrhage; placental abnormalities wth subsequent pregnancies, incisional problems, thromboembolic phenomenon and other postoperative/anesthesia complications.   The patient concurred with the proposed plan, giving informed written consent for the procedure.    FINDINGS:  Viable female infant in cephalic presentation.  Apgars 9 and 9 .  Nuchal cord x 1. Clear amniotic fluid.  Intact placenta, three vessel cord.  Normal uterus, fallopian tubes and ovaries bilaterally. Minimal intraperitoneal adhesive disease.  ANESTHESIA: Spinal INTRAVENOUS FLUIDS: 2000 ml   ESTIMATED BLOOD LOSS: 500 ml URINE OUTPUT:  225 ml SPECIMENS: Placenta sent to L&D COMPLICATIONS: None immediate  PROCEDURE IN DETAIL:  The patient preoperatively received intravenous antibiotics and had sequential compression devices applied to her lower extremities.  She was then taken to the operating room where spinal anesthesia was administered and was found to be  adequate. She was then placed in a dorsal supine position with a leftward tilt, and prepped and draped in a sterile manner.  A foley catheter was placed into her bladder and attached to constant gravity.  After an adequate timeout was performed, a Pfannenstiel skin incision was made with scalpel over her preexisting scar and carried through to the underlying layer of fascia. The fascia was incised in the midline, and this incision was extended bilaterally using the Mayo scissors.  Kocher clamps were applied to the superior aspect of the fascial incision and the underlying rectus muscles were dissected off bluntly.  A similar process was carried out on the inferior aspect of the fascial incision. The rectus muscles were separated in the midline bluntly and the peritoneum was entered sharply. Attention was turned to the lower uterine segment where a low transverse hysterotomy was made with a scalpel and extended bilaterally bluntly.  The infant was successfully delivered, the cord was clamped and cut after one minute, and the infant was handed over to the awaiting neonatology team. Uterine massage was then administered, and the placenta delivered intact with a three-vessel cord. The uterus was then cleared of clots and debris.  The hysterotomy was closed with 0 Vicryl in a running locked fashion, and an imbricating layer was also placed with 0 Vicryl.  Figure-of-eight 0 Vicryl serosal stitches were placed to help with hemostasis.  The pelvis was cleared of all clot and debris. Hemostasis was confirmed on all surfaces.  The peritoneum and the rectus muscles were reapproximated using 0 Vicryl interrupted stitches. The fascia was then closed using 0 PDS in a running fashion.  The subcutaneous layer was irrigated, and 30 ml of 0.5% Marcaine was injected subcutaneously around the incision.  The skin was closed with a 4-0 Vicryl subcuticular stitch. The patient tolerated the procedure well.  Sponge, lap, instrument and  needle counts were correct x 3.  She was taken to the recovery room in stable condition.    Verita Schneiders, MD, [place] Attending [place], [facility]

## 2017-06-15 NOTE — H&P (Signed)
Obstetric Preoperative History and Physical  Connie Jenkins is a 36 y.o. [id number] with IUP at [redacted]w[redacted]d with A2GDM presenting for scheduled repeat cesarean section.  No acute concerns.   Prenatal Course Source of Care: [place] Pregnancy complications or risks: Patient Active Problem List   Diagnosis Date Noted  . GBS (group B Streptococcus carrier), +RV culture, currently pregnant 05/25/2017  . Unwanted fertility 04/16/2017  . Gestational diabetes mellitus (GDM) affecting pregnancy 04/08/2017  . Previous cesarean section complicating pregnancy, antepartum condition or complication 56/38/9373  . Supervision of high risk pregnancy, antepartum 11/13/2016  . History of preterm delivery, currently pregnant 11/13/2016  . Herpes simplex virus (HSV) infection 11/13/2016  . Migraines 11/13/2016  . CONDYLOMA ACUMINATUM 01/24/2007   Prenatal labs and studies: Clinic  [place] Prenatal Labs  Dating  [redacted]w[redacted]d sono Blood type: A/Positive/-- (12/18 1457)   Genetic Screen 1 Screen:normal   AFP: negative Antibody:Negative (12/18 1457)neg  Anatomic [place] Normal @18wks ; female fetus Rubella: 1.90 (12/18 1457)immune  GTT  Third trimester: A2GDM RPR: Non Reactive (05/07 1030)   Flu vaccine Declines  HBsAg: Negative (12/18 1457) neg  TDaP vaccine  info given                                           HIV: Non Reactive (05/07 1030)   Baby Food    breast                                           GBS:  Positive  Contraception  Depo Provera Pap: Normal (06/2014)  Circumcision  [facility]   Pediatrician  [facility]   Support Person Wishes not to disclose      Past Medical History:  Diagnosis Date  . Anxiety    no meds  . Asthma    inhaler rarely used - twice year  . Depression    no current Tx.  . Febrile illness, acute 01/2011   Hospitalized for 6 days  . Gestational diabetes   . Headache   . Herpes 2008  . Hx of bronchitis 09/2012  . IBS (irritable bowel syndrome)   . Renal stone 08/26/2014  . Seasonal  allergies     Past Surgical History:  Procedure Laterality Date  . CESAREAN SECTION  2000 & 2004   "1st one I dilated 1cm, then the 2nd one was a rpt  . CYSTOSCOPY WITH RETROGRADE PYELOGRAM, URETEROSCOPY AND STENT PLACEMENT Left 08/26/2014   Procedure: CYSTOSCOPY WITH RETROGRADE PYELOGRAM, URETEROSCOPY, LASER, STONE EXTRACTION WITH BASKET;  Surgeon: Raynelle Bring, MD;  Location: [facility];  Service: Urology;  Laterality: Left;  . HERNIA REPAIR    . HYSTEROSCOPY N/A 03/24/2013   Procedure: HYSTEROSCOPY / Dilitation and curettage/endometrial curettings;  Surgeon: Lavonia Drafts, MD;  Location: [place] [facility];  Service: Gynecology;  Laterality: N/A;  Diagnostic hysteroscopy  . TMJ ARTHROPLASTY    . WISDOM TOOTH EXTRACTION      OB History  Gravida Para Term Preterm AB Living  3 2 1 1  0 2  SAB TAB Ectopic Multiple Live Births  0       2    # Outcome Date GA Lbr Len/2nd Weight Sex Delivery Anes PTL Lv  3 Current  2 Term 09/18/03 [redacted]w[redacted]d  7 lb (3.175 kg) F CS-LTranv Spinal  LIV  1 Preterm 08/03/99 [redacted]w[redacted]d  5 lb 2 oz (2.325 kg) F CS-LTranv EPI  LIV      Social History   Social History  . Marital status: Single    Spouse name: N/A  . Number of children: N/A  . Years of education: N/A   Social History Main Topics  . Smoking status: Never Smoker  . Smokeless tobacco: Never Used  . Alcohol use No     Comment: before +UPT   . Drug use: No  . Sexual activity: Yes    Partners: Male    Birth control/ protection: None   Other Topics Concern  . None   Social History Narrative  . None    Family History  Problem Relation Age of Onset  . Hypertension Mother   . Diabetes Mother     Prescriptions Prior to Admission  Medication Sig Dispense Refill Last Dose  . acetaminophen (TYLENOL) 500 MG tablet Take 500 mg by mouth every 6 (six) hours as needed for mild pain or headache.    Taking  . diphenhydrAMINE (BENADRYL) 25 MG tablet Take 50 mg by mouth at bedtime as needed for sleep.      . Prenatal Vit-Fe Fumarate-FA (PRENATAL PO) Take 1 tablet by mouth daily.     . valACYclovir (VALTREX) 1000 MG tablet Take 0.5 tablets (500 mg total) by mouth daily. 30 tablet 5 Taking  . cyclobenzaprine (FLEXERIL) 10 MG tablet Take 1 tablet (10 mg total) by mouth every 8 (eight) hours as needed for muscle spasms. 20 tablet 0 Taking  . glyBURIDE (DIABETA) 2.5 MG tablet TAKE 2 AND 1/2 TABLETS BY MOUTH AT BEDTIME 225 tablet 3 Taking  . pantoprazole (PROTONIX) 20 MG tablet Take 1 tablet (20 mg total) by mouth daily. 30 tablet 1 Taking    Allergies  Allergen Reactions  . Flagyl [Metronidazole] Nausea And Vomiting  . Latex Itching  . Penicillins Hives and Other (See Comments)    Childhood allergy Has patient had a PCN reaction causing immediate rash, facial/tongue/throat swelling, SOB or lightheadedness with hypotension: unknown Has patient had a PCN reaction causing severe rash involving mucus membranes or skin necrosis: unknown Has patient had a PCN reaction that required hospitalization unknown Has patient had a PCN reaction occurring within the last 10 years: no If all of the above answers are "NO", then may proceed with Cephalosporin use.      Review of Systems: Negative except for what is mentioned in HPI.  Physical Exam: BP (!) 141/81 (BP Location: Left Arm)   Pulse 75   Temp 98.4 F (36.9 C) (Oral)   Resp 18   Ht 5\' 3"  (1.6 m)   Wt 251 lb (113.9 kg)   LMP 09/15/2016   BMI 44.46 kg/m  FHR by Doppler: 138 bpm CONSTITUTIONAL: Well-developed, well-nourished female in no acute distress.  HENT:  Normocephalic, atraumatic, External right and left ear normal. Oropharynx is clear and moist. EYES: Conjunctivae and EOM are normal. Pupils are equal, round, and reactive to light. No scleral icterus.  NECK: Normal range of motion, supple, no masses SKIN: Skin is warm and dry. No rash noted. Not diaphoretic. No erythema. No pallor. [place]: Alert and oriented to person, place, and  time. Normal reflexes, muscle tone coordination. No cranial nerve deficit noted. PSYCHIATRIC: Normal mood and affect. Normal behavior. Normal judgment and thought content. CARDIOVASCULAR: Normal heart rate noted, regular rhythm RESPIRATORY: Effort  and breath sounds normal, no problems with respiration noted ABDOMEN: Soft, nontender, nondistended, gravid. Well-healed Pfannenstiel incision. PELVIC: Deferred MUSCULOSKELETAL: Normal range of motion. No edema and no tenderness. 2+ distal pulses.  Pertinent Labs/Studies:   Results for orders placed or performed during the hospital encounter of 06/15/17 (from the past 72 hour(s))  Glucose, capillary     Status: None   Collection Time: 06/15/17  7:49 AM  Result Value Ref Range   Glucose-Capillary 98 65 - 99 mg/dL    Assessment and Plan :Connie Jenkins is a 36 y.o. G3P1102 at [redacted]w[redacted]d being admitted for scheduled repeat cesarean section. The risks of cesarean section discussed with the patient included but were not limited to: bleeding which may require transfusion or reoperation; infection which may require antibiotics; injury to bowel, bladder, ureters or other surrounding organs; injury to the fetus; need for additional procedures including hysterectomy in the event of a life-threatening hemorrhage; placental abnormalities wth subsequent pregnancies, incisional problems, thromboembolic phenomenon and other postoperative/anesthesia complications. The patient concurred with the proposed plan, giving informed written consent for the procedure. Patient has been NPO since last night she will remain NPO for procedure. Anesthesia and OR aware. Preoperative prophylactic antibiotics and SCDs ordered on call to the OR. To OR when ready.    Verita Schneiders, MD, [place]  Attending [place], [facility]

## 2017-06-15 NOTE — Anesthesia Postprocedure Evaluation (Signed)
Anesthesia Post Note  Patient: Connie Jenkins  Procedure(s) Performed: Procedure(s) (LRB): CESAREAN SECTION (N/A)     Patient location during evaluation: PACU Anesthesia Type: MAC Level of consciousness: awake and alert Pain management: pain level controlled Vital Signs Assessment: post-procedure vital signs reviewed and stable Respiratory status: spontaneous breathing and respiratory function stable Cardiovascular status: blood pressure returned to baseline and stable Postop Assessment: spinal receding Anesthetic complications: no    Last Vitals:  Vitals:   06/15/17 1115 06/15/17 1145  BP: 125/84 134/90  Pulse: 68 (!) 56  Resp: 19 16  Temp:      Last Pain:  Vitals:   06/15/17 1215  TempSrc:   PainSc: 6    Pain Goal:                 Vernica Wachtel DANIEL

## 2017-06-16 ENCOUNTER — Encounter (HOSPITAL_COMMUNITY): Payer: Self-pay | Admitting: *Deleted

## 2017-06-16 LAB — CBC
HCT: 28.3 % — ABNORMAL LOW (ref 36.0–46.0)
HEMOGLOBIN: 9.3 g/dL — AB (ref 12.0–15.0)
MCH: 27.6 pg (ref 26.0–34.0)
MCHC: 32.9 g/dL (ref 30.0–36.0)
MCV: 84 fL (ref 78.0–100.0)
PLATELETS: 215 10*3/uL (ref 150–400)
RBC: 3.37 MIL/uL — ABNORMAL LOW (ref 3.87–5.11)
RDW: 14.7 % (ref 11.5–15.5)
WBC: 16.7 10*3/uL — ABNORMAL HIGH (ref 4.0–10.5)

## 2017-06-16 LAB — GLUCOSE, CAPILLARY: GLUCOSE-CAPILLARY: 102 mg/dL — AB (ref 65–99)

## 2017-06-16 LAB — BIRTH TISSUE RECOVERY COLLECTION (PLACENTA DONATION)

## 2017-06-16 NOTE — Progress Notes (Signed)
Subjective: Postpartum Day 1: Scheduled Repeat Cesarean Delivery Patient reports well controlled incisional pain, tolerating PO, + flatus and no problems voiding.  Denies HA, vision changes, chest pain, shortness of breath, or new swelling or pain in the legs.  Objective: Vital signs in last 24 hours: Temp:  [97.5 F (36.4 C)-98.5 F (36.9 C)] 98.5 F (36.9 C) (07/21 0350) Pulse Rate:  [56-74] 72 (07/21 0350) Resp:  [16-20] 18 (07/21 0350) BP: (115-151)/(60-90) 115/60 (07/21 0350) SpO2:  [97 %-100 %] 100 % (07/21 0350)  Physical Exam:  General: alert, cooperative, appears stated age and no distress Lochia: appropriate Uterine Fundus: firm Incision: healing well, no significant drainage, no dehiscence, no significant erythema DVT Evaluation: No evidence of DVT seen on physical exam.   Recent Labs  06/14/17 0920 06/16/17 0523  HGB 10.4* 9.3*  HCT 31.7* 28.3*  CBG         102@0712   Assessment/Plan: Status post Cesarean section. Doing well postoperatively. Hgb stable, BS well controlled. Continue current care.  Aneta Mins 06/16/2017, 7:51 AM

## 2017-06-18 MED ORDER — FERROUS SULFATE 325 (65 FE) MG PO TABS: 325.0000 mg | ORAL_TABLET | Freq: Every day | ORAL | 3 refills | Status: DC

## 2017-06-18 MED ORDER — OXYCODONE-ACETAMINOPHEN 5-325 MG PO TABS: 1.0000 | ORAL_TABLET | ORAL | 0 refills | Status: DC | PRN

## 2017-06-18 MED ORDER — IBUPROFEN 600 MG PO TABS: 600.0000 mg | ORAL_TABLET | Freq: Four times a day (QID) | ORAL | 0 refills | Status: DC | PRN

## 2017-06-18 NOTE — Discharge Instructions (Signed)

## 2017-06-18 NOTE — Discharge Summary (Signed)
OB Discharge Summary     Patient Name: Connie Jenkins DOB: 04/06/1981 MRN: [id number]  Date of admission: 06/15/2017 Delivering MD: Verita Schneiders A   Date of discharge: 06/18/2017  Admitting diagnosis: C-SECTION Intrauterine pregnancy: [redacted]w[redacted]d     Secondary diagnosis:  Principal Problem:   Previous cesarean section complicating pregnancy, antepartum condition or complication Active Problems:   Supervision of high risk pregnancy, antepartum   History of preterm delivery, currently pregnant   Herpes simplex virus (HSV) infection   S/P cesarean section  Additional problems: GDMA2; GBS pos     Discharge diagnosis: Term Pregnancy Delivered and GDM A2                                                                                                Post partum procedures:none  Augmentation: N/A  Complications: None  Hospital course:  Sceduled C/S   36 y.o. yo G3P2103 at [redacted]w[redacted]d was admitted to the hospital 06/15/2017 for scheduled cesarean section with the following indication:Elective Repeat.  Membrane Rupture Time/Date: 10:05 AM ,06/15/2017   Patient delivered a Viable infant.06/15/2017  Details of operation can be found in separate operative note.  Pateint had an uncomplicated postpartum course.  She is ambulating, tolerating a regular diet, passing flatus, and urinating well. Patient is discharged home in stable condition on  06/18/17         Physical exam  Vitals:   06/16/17 1851 06/17/17 0500 06/17/17 1835 06/18/17 0630  BP: 124/75 117/80 126/74 (!) 111/53  Pulse: 66 70 96 66  Resp: 16 16 16 18   Temp: 97.9 F (36.6 C) 98.3 F (36.8 C) 98.7 F (37.1 C) 98.7 F (37.1 C)  TempSrc: Oral Oral Oral Oral  SpO2: 100% 100% 100%   Weight:      Height:       General: alert and cooperative Lochia: appropriate Uterine Fundus: firm Incision: honeycomb intact; sm area marked and unchanged DVT Evaluation: No evidence of DVT seen on physical exam. Labs: Lab Results  Component Value  Date   WBC 16.7 (H) 06/16/2017   HGB 9.3 (L) 06/16/2017   HCT 28.3 (L) 06/16/2017   MCV 84.0 06/16/2017   PLT 215 06/16/2017   CMP Latest Ref Rng & Units 05/21/2017  Glucose 65 - 99 mg/dL 100(H)  BUN 6 - 20 mg/dL 8  Creatinine 0.44 - 1.00 mg/dL 0.63  Sodium 135 - 145 mmol/L 135  Potassium 3.5 - 5.1 mmol/L 4.0  Chloride 101 - 111 mmol/L 107  CO2 22 - 32 mmol/L 22  Calcium 8.9 - 10.3 mg/dL 8.7(L)  Total Protein 6.5 - 8.1 g/dL 6.3(L)  Total Bilirubin 0.3 - 1.2 mg/dL 0.4  Alkaline Phos 38 - 126 U/L 124  AST 15 - 41 U/L 49(H)  ALT 14 - 54 U/L 29    Discharge instruction: per After Visit Summary and "Baby and Me Booklet".  After visit meds:  Allergies as of 06/18/2017      Reactions   Flagyl [metronidazole] Nausea And Vomiting   Latex Itching   Penicillins Hives, Other (See Comments)   Childhood allergy Has  patient had a PCN reaction causing immediate rash, facial/tongue/throat swelling, SOB or lightheadedness with hypotension: unknown Has patient had a PCN reaction causing severe rash involving mucus membranes or skin necrosis: unknown Has patient had a PCN reaction that required hospitalization unknown Has patient had a PCN reaction occurring within the last 10 years: no If all of the above answers are "NO", then may proceed with Cephalosporin use.      Medication List    STOP taking these medications   acetaminophen 500 MG tablet Commonly known as:  TYLENOL   BENADRYL 25 MG tablet Generic drug:  diphenhydrAMINE   cyclobenzaprine 10 MG tablet Commonly known as:  FLEXERIL   glyBURIDE 2.5 MG tablet Commonly known as:  DIABETA   pantoprazole 20 MG tablet Commonly known as:  PROTONIX   valACYclovir 1000 MG tablet Commonly known as:  VALTREX     TAKE these medications   ferrous sulfate 325 (65 FE) MG tablet Take 1 tablet (325 mg total) by mouth daily with breakfast.   ibuprofen 600 MG tablet Commonly known as:  ADVIL,MOTRIN Take 1 tablet (600 mg total) by mouth  every 6 (six) hours as needed.   oxyCODONE-acetaminophen 5-325 MG tablet Commonly known as:  PERCOCET/ROXICET Take 1 tablet by mouth every 4 (four) hours as needed (pain scale 4-7).   PRENATAL PO Take 1 tablet by mouth daily.       Diet: routine diet  Activity: Advance as tolerated. Pelvic rest for 6 weeks.   Outpatient follow up:4 weeks; will need glucola during the postpartum period- inbox msg sent Follow up Appt:No future appointments. Follow up Visit:No Follow-up on file.  Postpartum contraception: Depo Provera  Newborn Data: Live born female  Birth Weight: 7 lb 5.3 oz (3325 g) APGAR: 9, 9  Baby Feeding: Bottle and Breast Disposition:home with mother   06/18/2017 Serita Grammes, CNM  8:41 AM

## 2017-06-19 ENCOUNTER — Telehealth (HOSPITAL_COMMUNITY): Payer: Self-pay | Admitting: Lactation Services

## 2017-06-19 NOTE — Telephone Encounter (Signed)
Called mom at request of Binnie Kand after follow up phone call. Mom did not answer. Mom reported to Baxter Hire that she was having a lot of breast pain and decided she wanted to try BF. She is attempting to pump. Left message to try ice packs to breast for 10-20 minutes prior to pumping to see if milk will flow. Enc mom to call back when she can so we can explore better what is going on. Offered mom OP appts tomorrow at 30, 2 and 3:30 and asked her to call back if she would like to schedule one. Will attempt to call again in the morning.

## 2017-06-20 ENCOUNTER — Telehealth (HOSPITAL_COMMUNITY): Payer: Self-pay | Admitting: Lactation Services

## 2017-06-20 NOTE — Progress Notes (Signed)
Subjective: Postpartum Day 2: Cesarean Delivery Patient reports incisional pain, tolerating PO, + flatus and no problems voiding.  Pain is well controlled. Patient denies headache, vision changes, chest pain, shortness of breath, or RUQ pain. Patient is planning to breast and bottle feed.  Objective: Vital signs: T: 36.8 (oral) BP: 117/80 HR: 70 bpm R: 16 rpm  Physical Exam:  General: alert, cooperative, appears stated age and no distress Lochia: appropriate Uterine Fundus: firm Incision: healing well, no dehiscence, no significant erythema, dressing clean DVT Evaluation: No evidence of DVT seen on physical exam.  Assessment/Plan: Status post Cesarean section. Doing well postoperatively.   Continue current care.  Aneta Mins 06/20/2017, 8:26 PM

## 2017-06-25 NOTE — Progress Notes (Addendum)
Subjective: Postpartum Day 2: Cesarean Delivery Patient reports tolerating PO, + flatus and no problems voiding.    Objective: Vital signs in last 24 hours:    Physical Exam:  General: alert, cooperative and no distress Lochia: appropriate Uterine Fundus: firm Incision: healing well, no significant drainage DVT Evaluation: No evidence of DVT seen on physical exam. Negative Homan's sign. No cords or calf tenderness. No significant calf/ankle edema.  No results for input(s): HGB, HCT in the last 72 hours.  Assessment/Plan: Status post Cesarean section. Doing well postoperatively.  Continue current care.  Fatima Blank 06/25/2017, 10:52 AM

## 2017-07-18 ENCOUNTER — Encounter: Payer: Self-pay | Admitting: Obstetrics and Gynecology

## 2017-07-18 ENCOUNTER — Other Ambulatory Visit: Payer: Medicaid Other

## 2017-07-18 ENCOUNTER — Ambulatory Visit (INDEPENDENT_AMBULATORY_CARE_PROVIDER_SITE_OTHER): Payer: Medicaid Other | Admitting: Obstetrics and Gynecology

## 2017-07-18 VITALS — BP 143/92 | HR 82 | Wt 225.2 lb

## 2017-07-18 DIAGNOSIS — Z3689 Encounter for other specified antenatal screening: Secondary | ICD-10-CM | POA: Diagnosis not present

## 2017-07-18 DIAGNOSIS — Z8632 Personal history of gestational diabetes: Secondary | ICD-10-CM

## 2017-07-18 DIAGNOSIS — Z3042 Encounter for surveillance of injectable contraceptive: Secondary | ICD-10-CM | POA: Diagnosis not present

## 2017-07-18 DIAGNOSIS — Z308 Encounter for other contraceptive management: Secondary | ICD-10-CM | POA: Insufficient documentation

## 2017-07-18 DIAGNOSIS — Z98891 History of uterine scar from previous surgery: Secondary | ICD-10-CM

## 2017-07-18 MED ORDER — MEDROXYPROGESTERONE ACETATE 150 MG/ML IM SUSP
150.0000 mg | Freq: Once | INTRAMUSCULAR | Status: AC
  Administered 2017-07-18: 150 mg via INTRAMUSCULAR

## 2017-07-18 MED ORDER — MEDROXYPROGESTERONE ACETATE 150 MG/ML IM SUSP: 150.0000 mg | Freq: Once | INTRAMUSCULAR | 3 refills | Status: DC

## 2017-07-18 NOTE — Progress Notes (Signed)
Post Partum Exam  Connie Jenkins is a 36 y.o. [phone] female who presents for a postpartum visit. She is 4 weeks postpartum following a low cervical transverse Cesarean section. I have fully reviewed the prenatal and intrapartum course. The delivery was at 3 gestational weeks.  Anesthesia: spinal. Postpartum course has been unremarkable. Baby's course has been unremarkable. Baby is feeding by bottle - Similac Alimentum. Bleeding spotting. Bowel function is normal. Bladder function is normal. Patient is not sexually active. Contraception method is none. Postpartum depression screening:neg. Office supply Depo given L upper outer quad.  The following portions of the patient's history were reviewed and updated as appropriate: allergies, current medications, past family history, past medical history, past social history and past surgical history.  Review of Systems Pertinent items are noted in HPI.    Objective:  Blood pressure (!) 143/92, pulse 82, weight 225 lb 3.2 oz (102.2 kg), unknown if currently breastfeeding.  General:  alert   Breasts:  not examined  Lungs: clear to auscultation bilaterally  Heart:  regular rate and rhythm, S1, S2 normal, no murmur, click, rub or gallop  Abdomen: soft, non-tender; bowel sounds normal; no masses,  no organomegaly, incision healing well, no S/Sx of infection   Vulva:  not evaluated  Vagina: not evaluated  Cervix:  not evaluated  Corpus: not examined  Adnexa:  not evaluated  Rectal Exam: Not performed.        Assessment:    Normal postpartum exam.   Contraceptive management H/O GDM  Plan:   1. Contraception: Depo-Provera injections 2. Glucola today 3. Return to work note for 07/31/17 without restrictions 3. Follow up in: 1 year for yearly exam or as needed and q 12 weeks for Depo Provera injections

## 2017-07-18 NOTE — Patient Instructions (Signed)

## 2017-07-19 LAB — GLUCOSE TOLERANCE, 2 HOURS W/ 1HR
GLUCOSE, 1 HOUR: 113 mg/dL (ref 65–179)
GLUCOSE, FASTING: 92 mg/dL — AB (ref 65–91)
Glucose, 2 hour: 68 mg/dL (ref 65–152)

## 2017-09-25 ENCOUNTER — Ambulatory Visit (INDEPENDENT_AMBULATORY_CARE_PROVIDER_SITE_OTHER): Payer: Medicaid Other | Admitting: Sports Medicine

## 2017-09-25 ENCOUNTER — Other Ambulatory Visit: Payer: Self-pay | Admitting: Sports Medicine

## 2017-09-25 DIAGNOSIS — M722 Plantar fascial fibromatosis: Secondary | ICD-10-CM

## 2017-09-25 DIAGNOSIS — M79671 Pain in right foot: Secondary | ICD-10-CM

## 2017-09-25 DIAGNOSIS — M79672 Pain in left foot: Secondary | ICD-10-CM

## 2017-09-25 MED ORDER — MELOXICAM 15 MG PO TABS: 15.0000 mg | ORAL_TABLET | Freq: Every day | ORAL | 0 refills | Status: AC

## 2017-09-25 NOTE — Progress Notes (Signed)
Patient ID: Connie Jenkins, female   DOB: 1981-05-09, 36 y.o.   MRN: [id number] Subjective: Connie Jenkins is a 36 y.o. female returns to office for follow up evaluation Left=Right heel pain states that during her pregnancy she gained a lot of weight and the pain came back in both her heels. States that pain is sharp to both heels 10/10. Patient states that her baby is now 4 months old and she does not breastfeed and want shots.  Patient Active Problem List   Diagnosis Date Noted  . Encounter for other contraceptive management 07/18/2017  . S/P cesarean section 06/15/2017  . Gestational diabetes mellitus (GDM) affecting pregnancy 04/08/2017  . Migraines 11/13/2016  . 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 medroxyPROGESTERone (DEPO-PROVERA) 150 MG/ML injection Inject 1 mL (150 mg total) into the muscle once. 1 mL 3  . Prenatal Vit-Fe Fumarate-FA (PRENATAL PO) Take 1 tablet by mouth daily.     No current facility-administered medications on file prior to visit.     Allergies  Allergen Reactions  . Flagyl [Metronidazole] Nausea And Vomiting  . Latex Itching  . Penicillins Hives and Other (See Comments)    Childhood allergy Has patient had a PCN reaction causing immediate rash, facial/tongue/throat swelling, SOB or lightheadedness with hypotension: unknown Has patient had a PCN reaction causing severe rash involving mucus membranes or skin necrosis: unknown Has patient had a PCN reaction that required hospitalization unknown Has patient had a PCN reaction occurring within the last 10 years: no If all of the above answers are "NO", then may proceed with Cephalosporin use.      Objective:   General:  Alert and oriented x 3, in no acute distress  Dermatology: Skin is warm, dry, and supple bilateral. Nails are within normal limits. There is no lower extremity erythema, no eccymosis, no open lesions  present bilateral.   Vascular: Dorsalis Pedis and Posterior Tibial pedal pulses are 2/4 bilateral. + hair growth noted bilateral. Capillary Fill Time is 3 seconds in all digits. No varicosities, No edema bilateral lower extremities.   Neurological: Sensation grossly intact to light touch with an achilles reflex of +2 and a  negative Tinel's sign bilateral. Vibratory, sharp/dull, Semmes Weinstein Monofilament within normal limits.   Musculoskeletal: There is tenderness to palpation at the medial calcaneal tubercale and through the insertion of the plantar fascia on the Left=right foot. No pain with compression to calcaneus or application of tuning fork. There is decreased Ankle joint range of motion bilateral. All other jointsrange of motion  within normal limits bilateral. Pes planus foot type bilateral. Strength 5/5 bilateral.   Assessment and Plan: Problem List Items Addressed This Visit    None    Visit Diagnoses    Plantar fasciitis, bilateral    -  Primary   Relevant Medications   meloxicam (MOBIC) 15 MG tablet   Foot pain, bilateral         -Complete examination performed.  -Previous x-rays reviewed. -Discussed with patient in detail the condition of plantar fasciitis, how this  occurs related to the foot type of the patient and general treatment options. -After oral consent and aseptic prep, injected a mixture containing 1 ml of 2%  plain lidocaine, 1 ml 0.5% plain marcaine, 0.5 ml of kenalog 10 and 0.5 ml of dexamethasone phosphate into right and left heels without complication . Post-injection care discussed with patient.  -Rx Mobic  to take as instructed    -Continue with stretching, icing, good supportive shoes (shoe list provided), inserts daily.  -Discussed long term care and reocurrence; will closely monitor; if fails to improve will consider other treatment modalities.  -Patient to return to office in 1 month or sooner if problems or questions arise. If still painful will  re-inject, tape, and advise patient to get OTC night splint off [employer].  Landis Martins, DPM

## 2017-09-25 NOTE — Patient Instructions (Signed)
For tennis shoes recommend:  Brooks Beast Ascis New balance Saucony Can be purchased at [employer] or [employer]  Vionic  SAS Can be purchased at [employer] or [employer]   For work shoes recommend: Sketchers Work Timberland boots  Can be purchased at a variety of places or [employer]   For casual shoes recommend: Vionic  Can be purchased at [employer] or [employer]  

## 2017-10-23 ENCOUNTER — Ambulatory Visit: Payer: Medicaid Other | Admitting: Sports Medicine

## 2017-10-30 ENCOUNTER — Ambulatory Visit: Payer: Self-pay | Admitting: Obstetrics

## 2017-11-08 ENCOUNTER — Ambulatory Visit (INDEPENDENT_AMBULATORY_CARE_PROVIDER_SITE_OTHER): Payer: Medicaid Other | Admitting: Obstetrics

## 2017-11-08 ENCOUNTER — Encounter: Payer: Self-pay | Admitting: Obstetrics

## 2017-11-08 ENCOUNTER — Other Ambulatory Visit (HOSPITAL_COMMUNITY)
Admission: RE | Admit: 2017-11-08 | Discharge: 2017-11-08 | Disposition: A | Discharge status: Home / Self Care | Payer: Medicaid Other | Source: Ambulatory Visit | Attending: Obstetrics | Admitting: Obstetrics

## 2017-11-08 VITALS — BP 134/85 | HR 89 | Wt 226.8 lb

## 2017-11-08 DIAGNOSIS — Z3009 Encounter for other general counseling and advice on contraception: Secondary | ICD-10-CM | POA: Diagnosis not present

## 2017-11-08 DIAGNOSIS — B009 Herpesviral infection, unspecified: Secondary | ICD-10-CM | POA: Diagnosis not present

## 2017-11-08 DIAGNOSIS — Z30011 Encounter for initial prescription of contraceptive pills: Secondary | ICD-10-CM

## 2017-11-08 DIAGNOSIS — N760 Acute vaginitis: Secondary | ICD-10-CM | POA: Diagnosis not present

## 2017-11-08 DIAGNOSIS — Z0000 Encounter for general adult medical examination without abnormal findings: Secondary | ICD-10-CM

## 2017-11-08 DIAGNOSIS — B9689 Other specified bacterial agents as the cause of diseases classified elsewhere: Secondary | ICD-10-CM

## 2017-11-08 DIAGNOSIS — N898 Other specified noninflammatory disorders of vagina: Secondary | ICD-10-CM | POA: Insufficient documentation

## 2017-11-08 MED ORDER — NORETHIN-ETH ESTRAD-FE BIPHAS 1 MG-10 MCG / 10 MCG PO TABS: 1.0000 | ORAL_TABLET | Freq: Every day | ORAL | 11 refills | Status: DC

## 2017-11-08 MED ORDER — PRENATE PIXIE 10-0.6-0.4-200 MG PO CAPS: 1.0000 | ORAL_CAPSULE | Freq: Every day | ORAL | 11 refills | Status: DC

## 2017-11-08 MED ORDER — VALACYCLOVIR HCL 1 G PO TABS: ORAL_TABLET | ORAL | 11 refills | Status: DC

## 2017-11-08 MED ORDER — CLINDAMYCIN HCL 300 MG PO CAPS: 300.0000 mg | ORAL_CAPSULE | Freq: Two times a day (BID) | ORAL | 2 refills | Status: AC

## 2017-11-08 NOTE — Progress Notes (Signed)
Patient ID: Connie Jenkins, female   DOB: 08/18/1981, 36 y.o.   MRN: [id number]  Chief Complaint  Patient presents with  . Vaginal Discharge    with foul odor  . Vaginal Irritation  . Outbreak    Pt c/o HSV outbreak, requesting Valtrex refill    HPI Connie Jenkins is a 36 y.o. female.  Malodorous vaginal discharge.  Also having a Herpes outbreak. HPI  Past Medical History:  Diagnosis Date  . Anxiety    no meds  . Asthma    inhaler rarely used - twice year  . Depression    no current Tx.  . Febrile illness, acute 01/2011   Hospitalized for 6 days  . Gestational diabetes   . Headache   . Herpes 2008  . Hx of bronchitis 09/2012  . IBS (irritable bowel syndrome)   . Renal stone 08/26/2014  . Seasonal allergies     Past Surgical History:  Procedure Laterality Date  . CESAREAN SECTION  2000 & 2004   "1st one I dilated 1cm, then the 2nd one was a rpt  . CESAREAN SECTION N/A 06/15/2017   Procedure: CESAREAN SECTION;  Surgeon: Osborne Oman, MD;  Location: [place];  Service: Obstetrics;  Laterality: N/A;  . CYSTOSCOPY WITH RETROGRADE PYELOGRAM, URETEROSCOPY AND STENT PLACEMENT Left 08/26/2014   Procedure: CYSTOSCOPY WITH RETROGRADE PYELOGRAM, URETEROSCOPY, LASER, STONE EXTRACTION WITH BASKET;  Surgeon: Raynelle Bring, MD;  Location: [facility];  Service: Urology;  Laterality: Left;  . HERNIA REPAIR    . HYSTEROSCOPY N/A 03/24/2013   Procedure: HYSTEROSCOPY / Dilitation and curettage/endometrial curettings;  Surgeon: Lavonia Drafts, MD;  Location: [facility];  Service: Gynecology;  Laterality: N/A;  Diagnostic hysteroscopy  . TMJ ARTHROPLASTY    . WISDOM TOOTH EXTRACTION      Family History  Problem Relation Age of Onset  . Hypertension Mother   . Diabetes Mother     Social History Social History   Tobacco Use  . Smoking status: Never Smoker  . Smokeless tobacco: Never Used  Substance Use Topics  . Alcohol use: No    Alcohol/week: 0.5 oz    Types: 1  Standard drinks or equivalent per week    Comment: before +UPT   . Drug use: No    Allergies  Allergen Reactions  . Flagyl [Metronidazole] Nausea And Vomiting  . Latex Itching  . Penicillins Hives and Other (See Comments)    Childhood allergy Has patient had a PCN reaction causing immediate rash, facial/tongue/throat swelling, SOB or lightheadedness with hypotension: unknown Has patient had a PCN reaction causing severe rash involving mucus membranes or skin necrosis: unknown Has patient had a PCN reaction that required hospitalization unknown Has patient had a PCN reaction occurring within the last 10 years: no If all of the above answers are "NO", then may proceed with Cephalosporin use.      Current Outpatient Medications  Medication Sig Dispense Refill  . Prenatal Vit-Fe Fumarate-FA (PRENATAL PO) Take 1 tablet by mouth daily.    . clindamycin (CLEOCIN) 300 MG capsule Take 1 capsule (300 mg total) by mouth 2 (two) times daily. 14 capsule 2  . medroxyPROGESTERone (DEPO-PROVERA) 150 MG/ML injection Inject 1 mL (150 mg total) into the muscle once. 1 mL 3  . Norethindrone-Ethinyl Estradiol-Fe Biphas (LO LOESTRIN FE) 1 MG-10 MCG / 10 MCG tablet Take 1 tablet by mouth daily. Start taking pills on the first day of period. 1 Package 11  . Prenat-FeAsp-Meth-FA-DHA w/o A (  PRENATE PIXIE) 10-0.6-0.4-200 MG CAPS Take 1 capsule by mouth daily before breakfast. 30 capsule 11  . valACYclovir (VALTREX) 1000 MG tablet Take 1 tablet ( 1000 mg ) po daily prn each outbreak. 30 tablet 11   No current facility-administered medications for this visit.     Review of Systems Review of Systems Constitutional: negative for fatigue and weight loss Respiratory: negative for cough and wheezing Cardiovascular: negative for chest pain, fatigue and palpitations Gastrointestinal: negative for abdominal pain and change in bowel habits Genitourinary:positive for malodorous vaginal discharge and herpes  outbreak Integument/breast: negative for nipple discharge Musculoskeletal:negative for myalgias Neurological: negative for gait problems and tremors Behavioral/Psych: negative for abusive relationship, depression Endocrine: negative for temperature intolerance      Blood pressure 134/85, pulse 89, weight 226 lb 12.8 oz (102.9 kg), unknown if currently breastfeeding.  Physical Exam Physical Exam           General:  Alert and no distress Abdomen:  normal findings: no organomegaly, soft, non-tender and no hernia  Pelvis:  External genitalia: normal general appearance Urinary system: urethral meatus normal and bladder without fullness, nontender Vaginal: normal without tenderness, induration or masses Cervix: normal appearance Adnexa: normal bimanual exam Uterus: anteverted and non-tender, normal size    50% of 15 min visit spent on counseling and coordination of care.   Data Reviewed Wet prep  Assessment and Plan:     1. Vaginal discharge Rx: - Cervicovaginal ancillary only  2. BV (bacterial vaginosis) Rx: - clindamycin (CLEOCIN) 300 MG capsule; Take 1 capsule (300 mg total) by mouth 2 (two) times daily.  Dispense: 14 capsule; Refill: 2  3. Herpes simplex virus (HSV) infection Rx: - valACYclovir (VALTREX) 1000 MG tablet; Take 1 tablet ( 1000 mg ) po daily prn each outbreak.  Dispense: 30 tablet; Refill: 11  4. Encounter for other general counseling and advice on contraception - wants OCP's  5. Encounter for initial prescription of contraceptive pills Rx: - Norethindrone-Ethinyl Estradiol-Fe Biphas (LO LOESTRIN FE) 1 MG-10 MCG / 10 MCG tablet; Take 1 tablet by mouth daily. Start taking pills on the first day of period.  Dispense: 1 Package; Refill: 11  6. Routine adult health maintenance Rx: - Prenat-FeAsp-Meth-FA-DHA w/o A (PRENATE PIXIE) 10-0.6-0.4-200 MG CAPS; Take 1 capsule by mouth daily before breakfast.  Dispense: 30 capsule; Refill: 11   Plan    Follow up in 3  months for Annual and Pap  No orders of the defined types were placed in this encounter.  Meds ordered this encounter  Medications  . valACYclovir (VALTREX) 1000 MG tablet    Sig: Take 1 tablet ( 1000 mg ) po daily prn each outbreak.    Dispense:  30 tablet    Refill:  11  . clindamycin (CLEOCIN) 300 MG capsule    Sig: Take 1 capsule (300 mg total) by mouth 2 (two) times daily.    Dispense:  14 capsule    Refill:  2  . Norethindrone-Ethinyl Estradiol-Fe Biphas (LO LOESTRIN FE) 1 MG-10 MCG / 10 MCG tablet    Sig: Take 1 tablet by mouth daily. Start taking pills on the first day of period.    Dispense:  1 Package    Refill:  11    Submit other coverage code 3  BIN:  [id number]  PCN:  [id number]   GRP:  [id number]   ID:  [id number]  . Prenat-FeAsp-Meth-FA-DHA w/o A (PRENATE PIXIE) 10-0.6-0.4-200 MG CAPS    Sig: Take 1 capsule by  mouth daily before breakfast.    Dispense:  30 capsule    Refill:  11

## 2017-11-09 LAB — CERVICOVAGINAL ANCILLARY ONLY
BACTERIAL VAGINITIS: NEGATIVE
Candida vaginitis: NEGATIVE
Chlamydia: NEGATIVE
Neisseria Gonorrhea: NEGATIVE
TRICH (WINDOWPATH): NEGATIVE

## 2017-11-30 ENCOUNTER — Telehealth: Payer: Self-pay | Admitting: Pediatrics

## 2017-11-30 MED ORDER — TERCONAZOLE 0.8 % VA CREA: 1.0000 | TOPICAL_CREAM | Freq: Every day | VAGINAL | 0 refills | Status: DC

## 2017-11-30 NOTE — Telephone Encounter (Signed)
Pt called to office c/o yeast inf d/t recent ATB.  Rx sent per standing order.  Pt was advised and voiced understanding.

## 2017-12-04 ENCOUNTER — Ambulatory Visit: Payer: Medicaid Other | Admitting: Sports Medicine

## 2018-01-08 ENCOUNTER — Ambulatory Visit: Payer: Medicaid Other | Admitting: Sports Medicine

## 2018-01-08 DIAGNOSIS — M79672 Pain in left foot: Secondary | ICD-10-CM

## 2018-01-08 DIAGNOSIS — M79671 Pain in right foot: Secondary | ICD-10-CM

## 2018-01-08 DIAGNOSIS — M722 Plantar fascial fibromatosis: Secondary | ICD-10-CM

## 2018-01-08 MED ORDER — MELOXICAM 15 MG PO TABS: 15.0000 mg | ORAL_TABLET | Freq: Every day | ORAL | 0 refills | Status: DC

## 2018-01-09 ENCOUNTER — Encounter: Payer: Self-pay | Admitting: Sports Medicine

## 2018-01-09 NOTE — Progress Notes (Signed)
Patient presented to office for follow-up of bilateral plantar fasciitis.  Patient reported to my nurse that her pain is back again and that it is sharp 9 out of 10 reports that she had meloxicam in the past that seemed to help and also had past injections that seem to help.  Patient had to leave before I could come into the room to complete my examination because she had to go pick up her daughter. I had my nurse to refill her meloxicam and to provide her with re-education on stretching for plantar fasciitis prior to her leaving.  Patient to return to office if no additional relief for possible reinjection, taping, and a further discussion of patient purchasing an over-the-counter night splint off of [employer]. -Dr. Cannon Kettle

## 2018-01-28 ENCOUNTER — Ambulatory Visit: Payer: Medicaid Other | Admitting: Obstetrics

## 2018-01-29 ENCOUNTER — Ambulatory Visit: Payer: Medicaid Other | Admitting: Sports Medicine

## 2018-02-05 ENCOUNTER — Other Ambulatory Visit (HOSPITAL_COMMUNITY)
Admission: RE | Admit: 2018-02-05 | Discharge: 2018-02-05 | Disposition: A | Discharge status: Home / Self Care | Payer: Medicaid Other | Source: Ambulatory Visit | Attending: Obstetrics | Admitting: Obstetrics

## 2018-02-05 ENCOUNTER — Encounter: Payer: Self-pay | Admitting: Obstetrics

## 2018-02-05 ENCOUNTER — Ambulatory Visit: Payer: Medicaid Other | Admitting: Obstetrics

## 2018-02-05 ENCOUNTER — Other Ambulatory Visit: Payer: Self-pay

## 2018-02-05 VITALS — BP 124/74 | HR 75 | Temp 98.1°F | Ht 63.0 in | Wt 223.8 lb

## 2018-02-05 DIAGNOSIS — Z01419 Encounter for gynecological examination (general) (routine) without abnormal findings: Secondary | ICD-10-CM

## 2018-02-05 DIAGNOSIS — N915 Oligomenorrhea, unspecified: Secondary | ICD-10-CM

## 2018-02-05 DIAGNOSIS — M545 Low back pain, unspecified: Secondary | ICD-10-CM

## 2018-02-05 DIAGNOSIS — N898 Other specified noninflammatory disorders of vagina: Secondary | ICD-10-CM

## 2018-02-05 DIAGNOSIS — Z0000 Encounter for general adult medical examination without abnormal findings: Secondary | ICD-10-CM | POA: Diagnosis not present

## 2018-02-05 DIAGNOSIS — Z3202 Encounter for pregnancy test, result negative: Secondary | ICD-10-CM

## 2018-02-05 LAB — POCT URINE PREGNANCY: Preg Test, Ur: NEGATIVE

## 2018-02-05 NOTE — Progress Notes (Signed)
Presents for AEX/PAP, possible UTI. Complains of lower back pain 6/10, fever, chills, odor, nausea x 2 weeks.  Denies burning and urinary frequency. She has not had a period since January 2019.  UPT today is NEGATIVE.

## 2018-02-05 NOTE — Progress Notes (Signed)
Subjective:        Connie Jenkins is a 37 y.o. female here for a routine exam.  Current complaints:  No period since December 2018.  Has had backache, fever, chills and nausea over the past 2 weeks.  Thinks that these symptoms were related to the flu.  Personal health questionnaire:  Is patient Connie Jenkins, have a family history of breast and/or ovarian cancer: no Is there a family history of uterine cancer diagnosed at age < 75, gastrointestinal cancer, urinary tract cancer, family member who is a Field seismologist syndrome-associated carrier: no Is the patient overweight and hypertensive, family history of diabetes, personal history of gestational diabetes, preeclampsia or PCOS: no Is patient over 16, have PCOS,  family history of premature CHD under age 28, diabetes, smoke, have hypertension or peripheral artery disease:  no At any time, has a partner hit, kicked or otherwise hurt or frightened you?: no Over the past 2 weeks, have you felt down, depressed or hopeless?: no Over the past 2 weeks, have you felt little interest or pleasure in doing things?:no   Gynecologic History No LMP recorded (lmp unknown). Contraception: OCP (estrogen/progesterone) Last Pap: 2015. Results were: normal Last mammogram: n/a. Results were: n/a  Obstetric History OB History  Gravida Para Term Preterm AB Living  3 3 2 1  0 3  SAB TAB Ectopic Multiple Live Births  0     0 3    # Outcome Date GA Lbr Len/2nd Weight Sex Delivery Anes PTL Lv  3 Term 06/15/17 [redacted]w[redacted]d  7 lb 5.3 oz (3.325 kg) M CS-LTranv Spinal, EPI  LIV  2 Term 09/18/03 [redacted]w[redacted]d  7 lb (3.175 kg) F CS-LTranv Spinal  LIV  1 Preterm 08/03/99 [redacted]w[redacted]d  5 lb 2 oz (2.325 kg) F CS-LTranv EPI  LIV      Past Medical History:  Diagnosis Date  . Anxiety    no meds  . Asthma    inhaler rarely used - twice year  . Depression    no current Tx.  . Febrile illness, acute 01/2011   Hospitalized for 6 days  . Gestational diabetes   . Headache   . Herpes  2008  . Hx of bronchitis 09/2012  . IBS (irritable bowel syndrome)   . Renal stone 08/26/2014  . Seasonal allergies     Past Surgical History:  Procedure Laterality Date  . CESAREAN SECTION  2000 & 2004   "1st one I dilated 1cm, then the 2nd one was a rpt  . CESAREAN SECTION N/A 06/15/2017   Procedure: CESAREAN SECTION;  Surgeon: Osborne Oman, MD;  Location: [place] [facility];  Service: Obstetrics;  Laterality: N/A;  . CYSTOSCOPY WITH RETROGRADE PYELOGRAM, URETEROSCOPY AND STENT PLACEMENT Left 08/26/2014   Procedure: CYSTOSCOPY WITH RETROGRADE PYELOGRAM, URETEROSCOPY, LASER, STONE EXTRACTION WITH BASKET;  Surgeon: Raynelle Bring, MD;  Location: [facility];  Service: Urology;  Laterality: Left;  . HERNIA REPAIR    . HYSTEROSCOPY N/A 03/24/2013   Procedure: HYSTEROSCOPY / Dilitation and curettage/endometrial curettings;  Surgeon: Lavonia Drafts, MD;  Location: [facility];  Service: Gynecology;  Laterality: N/A;  Diagnostic hysteroscopy  . TMJ ARTHROPLASTY    . WISDOM TOOTH EXTRACTION       Current Outpatient Medications:  .  Prenatal Vit-Fe Fumarate-FA (PRENATAL PO), Take 1 tablet by mouth daily., Disp: , Rfl:  .  valACYclovir (VALTREX) 1000 MG tablet, Take 1 tablet ( 1000 mg ) po daily prn each outbreak., Disp: 30 tablet, Rfl: 11 .  clindamycin (CLEOCIN) 300 MG capsule, Take 1 capsule (300 mg total) by mouth 2 (two) times daily. (Patient not taking: Reported on 02/05/2018), Disp: 14 capsule, Rfl: 2 .  medroxyPROGESTERone (DEPO-PROVERA) 150 MG/ML injection, Inject 1 mL (150 mg total) into the muscle once., Disp: 1 mL, Rfl: 3 .  meloxicam (MOBIC) 15 MG tablet, Take 1 tablet (15 mg total) by mouth daily. (Patient not taking: Reported on 02/05/2018), Disp: 30 tablet, Rfl: 0 .  Norethindrone-Ethinyl Estradiol-Fe Biphas (LO LOESTRIN FE) 1 MG-10 MCG / 10 MCG tablet, Take 1 tablet by mouth daily. Start taking pills on the first day of period. (Patient not taking: Reported on 02/05/2018), Disp: 1  Package, Rfl: 11 .  Prenat-FeAsp-Meth-FA-DHA w/o A (PRENATE PIXIE) 10-0.6-0.4-200 MG CAPS, Take 1 capsule by mouth daily before breakfast., Disp: 30 capsule, Rfl: 11 .  terconazole (TERAZOL 3) 0.8 % vaginal cream, Place 1 applicator vaginally at bedtime. (Patient not taking: Reported on 02/05/2018), Disp: 20 g, Rfl: 0 Allergies  Allergen Reactions  . Flagyl [Metronidazole] Nausea And Vomiting  . Latex Itching  . Penicillins Hives and Other (See Comments)    Childhood allergy Has patient had a PCN reaction causing immediate rash, facial/tongue/throat swelling, SOB or lightheadedness with hypotension: unknown Has patient had a PCN reaction causing severe rash involving mucus membranes or skin necrosis: unknown Has patient had a PCN reaction that required hospitalization unknown Has patient had a PCN reaction occurring within the last 10 years: no If all of the above answers are "NO", then may proceed with Cephalosporin use.      Social History   Tobacco Use  . Smoking status: Never Smoker  . Smokeless tobacco: Never Used  Substance Use Topics  . Alcohol use: No    Alcohol/week: 0.5 oz    Types: 1 Standard drinks or equivalent per week    Comment: before +UPT     Family History  Problem Relation Age of Onset  . Hypertension Mother   . Diabetes Mother       Review of Systems  Constitutional: negative for fatigue and weight loss Respiratory: negative for cough and wheezing Cardiovascular: negative for chest pain, fatigue and palpitations Gastrointestinal: negative for abdominal pain and change in bowel habits Musculoskeletal:negative for myalgias Neurological: negative for gait problems and tremors Behavioral/Psych: negative for abusive relationship, depression Endocrine: negative for temperature intolerance    Genitourinary:negative for abnormal menstrual periods, genital lesions, hot flashes, sexual problems and vaginal discharge Integument/breast: negative for breast lump,  breast tenderness, nipple discharge and skin lesion(s)    Objective:       BP 124/74   Pulse 75   Temp 98.1 F (36.7 C)   Ht 5\' 3"  (1.6 m)   Wt 223 lb 12.8 oz (101.5 kg)   LMP  (LMP Unknown)   Breastfeeding? No   BMI 39.64 kg/m  General:   alert  Skin:   no rash or abnormalities  Lungs:   clear to auscultation bilaterally  Heart:   regular rate and rhythm, S1, S2 normal, no murmur, click, rub or gallop  Breasts:   normal without suspicious masses, skin or nipple changes or axillary nodes  Abdomen:  normal findings: no organomegaly, soft, non-tender and no hernia  Pelvis:  External genitalia: normal general appearance Urinary system: urethral meatus normal and bladder without fullness, nontender Vaginal: normal without tenderness, induration or masses Cervix: normal appearance Adnexa: normal bimanual exam Uterus: anteverted and non-tender, normal size   Lab Review Urine pregnancy test Labs reviewed  yes Radiologic studies reviewed yes  50% of 20 min visit spent on counseling and coordination of care.   Assessment:     1. Encounter for routine gynecological examination with Papanicolaou smear of cervix Rx: - Cytology - PAP - POCT urine pregnancy  2. Acute midline low back pain without sciatica Rx: - US PELVIC COMPLETE WITH TRANSVAGINAL; Future  3. Oligomenorrhea, unspecified type Rx: - US PELVIC COMPLETE WITH TRANSVAGINAL; Future  4. Vaginal discharge Rx: - Cervicovaginal ancillary only    Plan:    Education reviewed: calcium supplements, depression evaluation, low fat, low cholesterol diet, safe sex/STD prevention, self breast exams and weight bearing exercise. Contraception: OCP (estrogen/progesterone). Follow up in: 2 weeks.   No orders of the defined types were placed in this encounter.  Orders Placed This Encounter  Procedures  . US PELVIC COMPLETE WITH TRANSVAGINAL    Standing Status:   Future    Standing Expiration Date:   04/08/2019    Order  Specific Question:   Reason for Exam (SYMPTOM  OR DIAGNOSIS REQUIRED)    Answer:   Oligomenorrhea    Order Specific Question:   Preferred imaging location?    Answer:   [facility]  . POCT urine pregnancy    Shelly Bombard MD

## 2018-02-06 LAB — CERVICOVAGINAL ANCILLARY ONLY
BACTERIAL VAGINITIS: NEGATIVE
CANDIDA VAGINITIS: NEGATIVE
CHLAMYDIA, DNA PROBE: NEGATIVE
Neisseria Gonorrhea: NEGATIVE
Trichomonas: NEGATIVE

## 2018-02-07 LAB — CYTOLOGY - PAP
DIAGNOSIS: NEGATIVE
HPV (WINDOPATH): NOT DETECTED

## 2018-02-11 ENCOUNTER — Ambulatory Visit (HOSPITAL_COMMUNITY): Payer: Medicaid Other

## 2018-02-12 ENCOUNTER — Ambulatory Visit: Payer: Medicaid Other | Admitting: Sports Medicine

## 2018-02-13 ENCOUNTER — Ambulatory Visit (HOSPITAL_COMMUNITY)
Admission: RE | Admit: 2018-02-13 | Discharge: 2018-02-13 | Disposition: A | Discharge status: Home / Self Care | Payer: Medicaid Other | Source: Ambulatory Visit | Attending: Obstetrics | Admitting: Obstetrics

## 2018-02-13 DIAGNOSIS — M545 Low back pain, unspecified: Secondary | ICD-10-CM

## 2018-02-13 DIAGNOSIS — Z9889 Other specified postprocedural states: Secondary | ICD-10-CM | POA: Diagnosis not present

## 2018-02-13 DIAGNOSIS — N915 Oligomenorrhea, unspecified: Secondary | ICD-10-CM | POA: Insufficient documentation

## 2018-02-19 ENCOUNTER — Ambulatory Visit: Payer: Medicaid Other | Admitting: Obstetrics

## 2018-02-28 ENCOUNTER — Encounter: Payer: Self-pay | Admitting: Obstetrics

## 2018-02-28 ENCOUNTER — Ambulatory Visit (INDEPENDENT_AMBULATORY_CARE_PROVIDER_SITE_OTHER): Payer: Medicaid Other | Admitting: Obstetrics

## 2018-02-28 VITALS — BP 125/77 | HR 69 | Wt 223.2 lb

## 2018-02-28 DIAGNOSIS — N915 Oligomenorrhea, unspecified: Secondary | ICD-10-CM

## 2018-02-28 NOTE — Progress Notes (Signed)
Patient ID: Connie Jenkins, female   DOB: 1980/12/08, 37 y.o.   MRN: [id number]  Chief Complaint  Patient presents with  . Follow-up    [place] results    HPI Connie Jenkins is a 37 y.o. female.  History of oligomenorrhea.  Presents for ultrasound results. HPI  Past Medical History:  Diagnosis Date  . Anxiety    no meds  . Asthma    inhaler rarely used - twice year  . Depression    no current Tx.  . Febrile illness, acute 01/2011   Hospitalized for 6 days  . Gestational diabetes   . Headache   . Herpes 2008  . Hx of bronchitis 09/2012  . IBS (irritable bowel syndrome)   . Renal stone 08/26/2014  . Seasonal allergies     Past Surgical History:  Procedure Laterality Date  . CESAREAN SECTION  2000 & 2004   "1st one I dilated 1cm, then the 2nd one was a rpt  . CESAREAN SECTION N/A 06/15/2017   Procedure: CESAREAN SECTION;  Surgeon: Osborne Oman, MD;  Location: [facility];  Service: Obstetrics;  Laterality: N/A;  . CYSTOSCOPY WITH RETROGRADE PYELOGRAM, URETEROSCOPY AND STENT PLACEMENT Left 08/26/2014   Procedure: CYSTOSCOPY WITH RETROGRADE PYELOGRAM, URETEROSCOPY, LASER, STONE EXTRACTION WITH BASKET;  Surgeon: Raynelle Bring, MD;  Location: [facility];  Service: Urology;  Laterality: Left;  . HERNIA REPAIR    . HYSTEROSCOPY N/A 03/24/2013   Procedure: HYSTEROSCOPY / Dilitation and curettage/endometrial curettings;  Surgeon: Lavonia Drafts, MD;  Location: [facility];  Service: Gynecology;  Laterality: N/A;  Diagnostic hysteroscopy  . TMJ ARTHROPLASTY    . WISDOM TOOTH EXTRACTION      Family History  Problem Relation Age of Onset  . Hypertension Mother   . Diabetes Mother     Social History Social History   Tobacco Use  . Smoking status: Never Smoker  . Smokeless tobacco: Never Used  Substance Use Topics  . Alcohol use: No    Alcohol/week: 0.5 oz    Types: 1 Standard drinks or equivalent per week    Comment: before +UPT   . Drug use: No    Allergies   Allergen Reactions  . Flagyl [Metronidazole] Nausea And Vomiting  . Latex Itching  . Penicillins Hives and Other (See Comments)    Childhood allergy Has patient had a PCN reaction causing immediate rash, facial/tongue/throat swelling, SOB or lightheadedness with hypotension: unknown Has patient had a PCN reaction causing severe rash involving mucus membranes or skin necrosis: unknown Has patient had a PCN reaction that required hospitalization unknown Has patient had a PCN reaction occurring within the last 10 years: no If all of the above answers are "NO", then may proceed with Cephalosporin use.      Current Outpatient Medications  Medication Sig Dispense Refill  . clindamycin (CLEOCIN) 300 MG capsule Take 1 capsule (300 mg total) by mouth 2 (two) times daily. (Patient not taking: Reported on 02/05/2018) 14 capsule 2  . medroxyPROGESTERone (DEPO-PROVERA) 150 MG/ML injection Inject 1 mL (150 mg total) into the muscle once. 1 mL 3  . meloxicam (MOBIC) 15 MG tablet Take 1 tablet (15 mg total) by mouth daily. (Patient not taking: Reported on 02/05/2018) 30 tablet 0  . Norethindrone-Ethinyl Estradiol-Fe Biphas (LO LOESTRIN FE) 1 MG-10 MCG / 10 MCG tablet Take 1 tablet by mouth daily. Start taking pills on the first day of period. (Patient not taking: Reported on 02/05/2018) 1 Package 11  . Prenat-FeAsp-Meth-FA-DHA  w/o A (PRENATE PIXIE) 10-0.6-0.4-200 MG CAPS Take 1 capsule by mouth daily before breakfast. (Patient not taking: Reported on 02/28/2018) 30 capsule 11  . Prenatal Vit-Fe Fumarate-FA (PRENATAL PO) Take 1 tablet by mouth daily.    Marland Kitchen terconazole (TERAZOL 3) 0.8 % vaginal cream Place 1 applicator vaginally at bedtime. (Patient not taking: Reported on 02/05/2018) 20 g 0  . valACYclovir (VALTREX) 1000 MG tablet Take 1 tablet ( 1000 mg ) po daily prn each outbreak. (Patient not taking: Reported on 02/28/2018) 30 tablet 11   No current facility-administered medications for this visit.     Review  of Systems Review of Systems Constitutional: negative for fatigue and weight loss Respiratory: negative for cough and wheezing Cardiovascular: negative for chest pain, fatigue and palpitations Gastrointestinal: negative for abdominal pain and change in bowel habits Genitourinary:POSITIVE FOR IRREGULAR MENSES Integument/breast: negative for nipple discharge Musculoskeletal:negative for myalgias Neurological: negative for gait problems and tremors Behavioral/Psych: negative for abusive relationship, depression Endocrine: negative for temperature intolerance      Blood pressure 125/77, pulse 69, weight 223 lb 3.2 oz (101.2 kg), last menstrual period 02/23/2018, not currently breastfeeding.  Physical Exam Physical Exam: Deferred  >50% of 10 min visit spent on counseling and coordination of care.   Data Reviewed Ultrasound: US PELVIC COMPLETE WITH TRANSVAGINAL (Accession [id number]) (Order [id number])  Imaging  Date: 02/13/2018 Department: [facility] Released By: Cristal Generous, NT Authorizing: Shelly Bombard, MD  Exam Information   Status Exam Begun  Exam Ended   Final [99] 02/13/2018 3:19 PM 02/13/2018 3:44 PM  PACS Images   Show images for US PELVIC COMPLETE WITH TRANSVAGINAL  Study Result   CLINICAL DATA:  Patient with oligomenorrhea.  EXAM: TRANSABDOMINAL AND TRANSVAGINAL ULTRASOUND OF PELVIS  TECHNIQUE: Both transabdominal and transvaginal ultrasound examinations of the pelvis were performed. Transabdominal technique was performed for global imaging of the pelvis including uterus, ovaries, adnexal regions, and pelvic cul-de-sac. It was necessary to proceed with endovaginal exam following the transabdominal exam to visualize the endometrium.  COMPARISON:  None  FINDINGS: Uterus  Measurements: 9.0 x 3.9 x 4.6 cm. No fibroids or other mass visualized. Prior C-section changes.  Endometrium  Thickness: 6 mm.  No focal  abnormality visualized.  Right ovary  Measurements: 2.7 x 1.7 x 1.9 cm. Normal appearance/no adnexal mass.  Left ovary  Measurements: 2.7 x 1.6 x 1.7 cm. Normal appearance/no adnexal mass.  Other findings  No abnormal free fluid.  IMPRESSION: Unremarkable pelvic ultrasound.  Prior C-section scar.   Electronically Signed   By: Lovey Newcomer M.D.   On: 02/13/2018 16:28    Assessment     1. Oligomenorrhea, unspecified type - Hormonal Imbalance - Will follow clinically.  May need hormonal regulation of cycles.    Plan    Follow up prn  No orders of the defined types were placed in this encounter.  No orders of the defined types were placed in this encounter.    Shelly Bombard MD 02-28-2018

## 2018-02-28 NOTE — Progress Notes (Signed)
Presents for [place] FU.

## 2018-09-04 IMAGING — US US MFM OB DETAIL+14 WK
1 series · 14 of 28 positions shown · non-contrast
Comparison: none

[Series 1: us mfm ob detail+14 wk · 90 acquisitions, 14 frames shown]
[im 4/90]
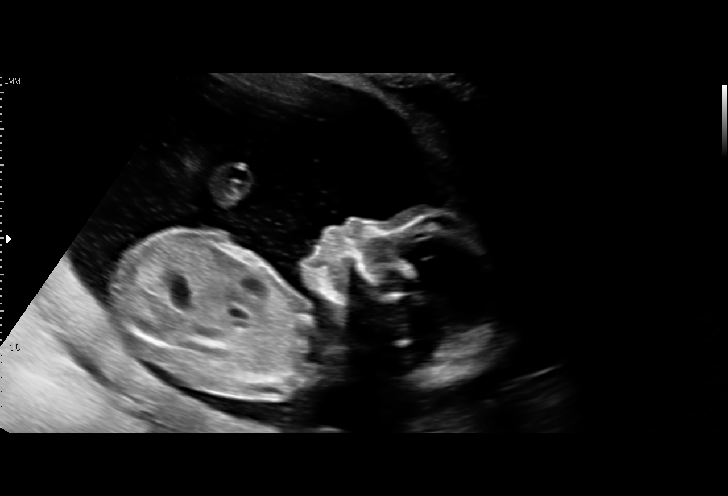
[im 10/90]
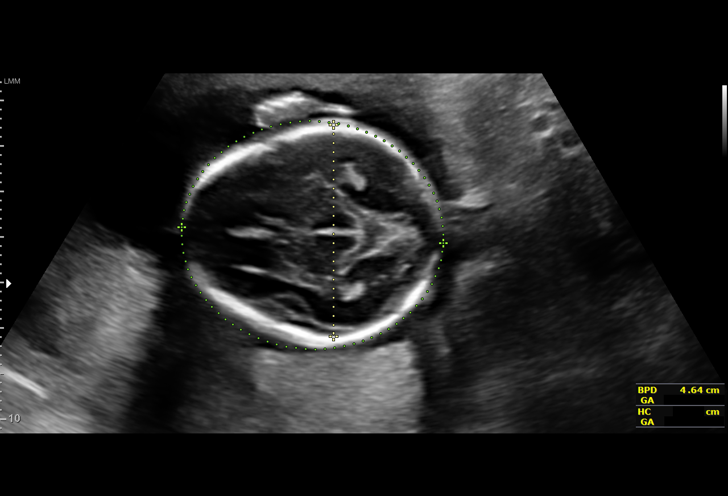
[im 17/90]
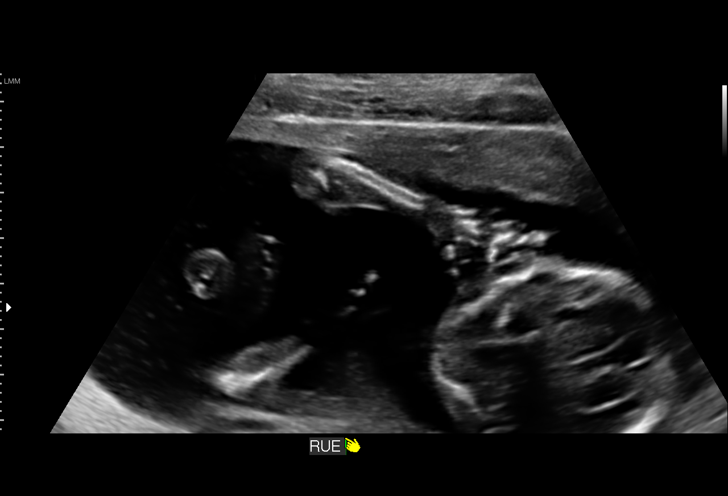
[im 24/90]
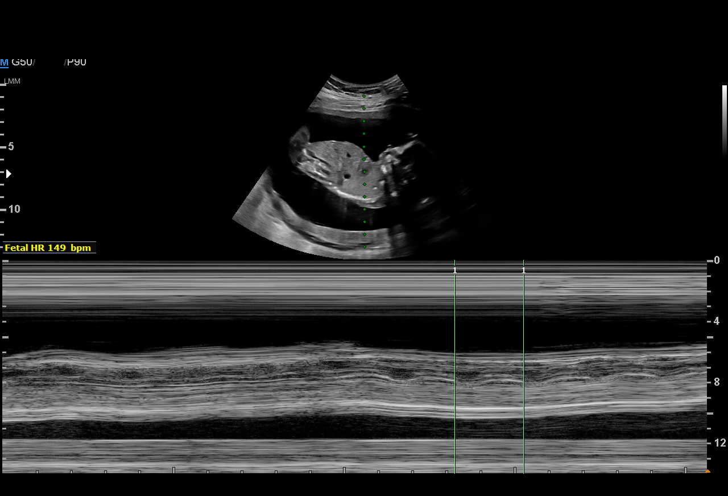
[im 30/90]
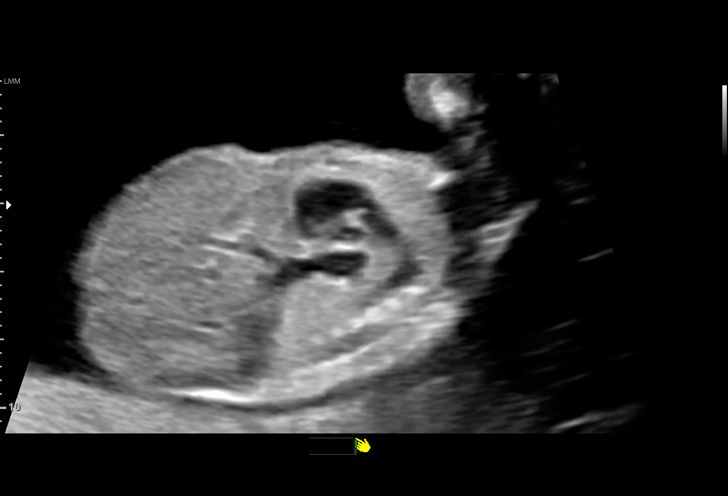
[im 37/90]
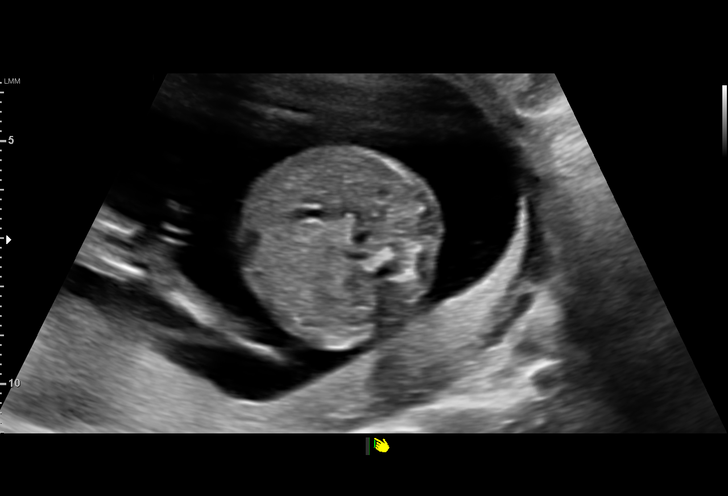
[im 43/90]
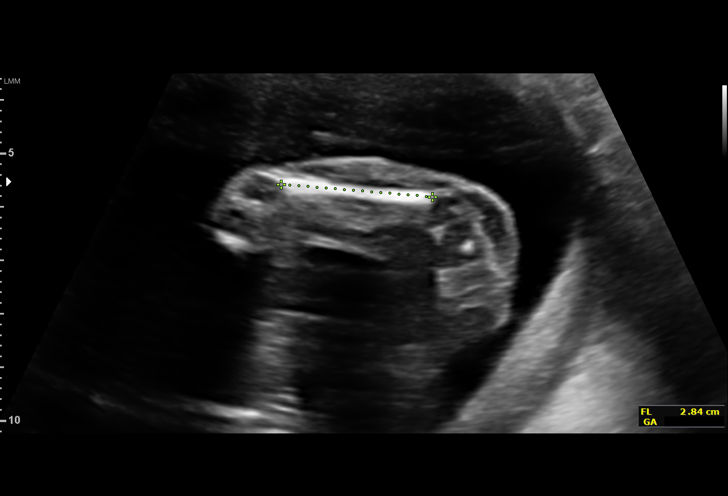
[im 50/90]
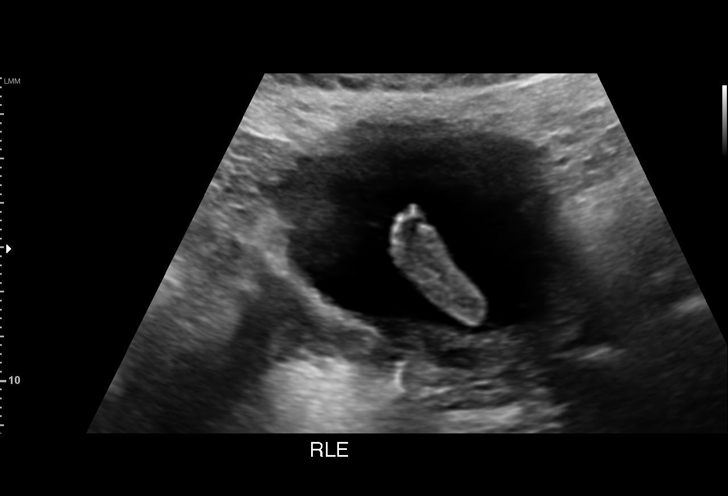
[im 57/90]
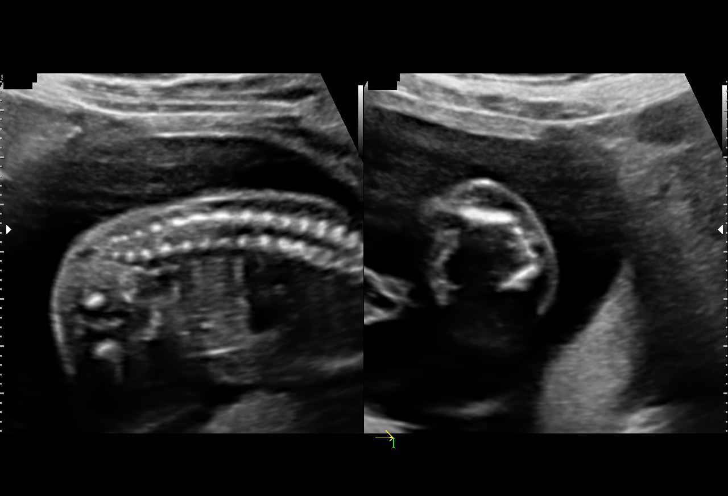
[im 63/90]
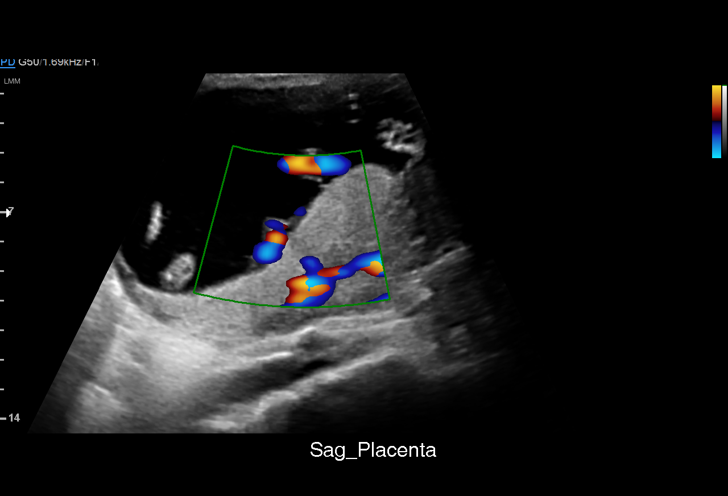
[im 70/90]
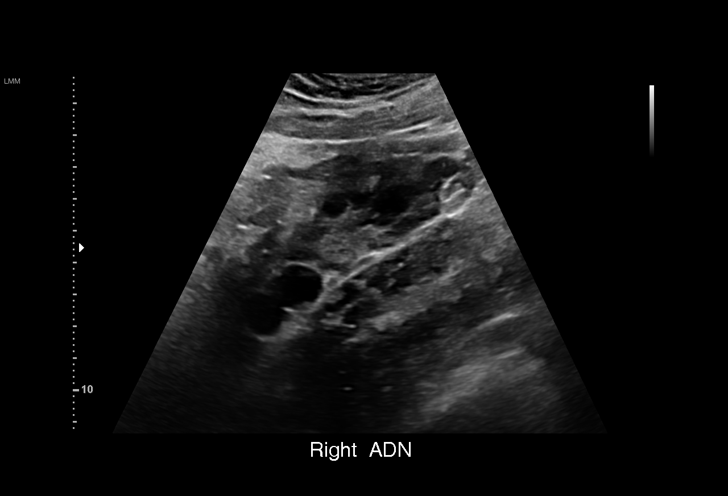
[im 76/90]
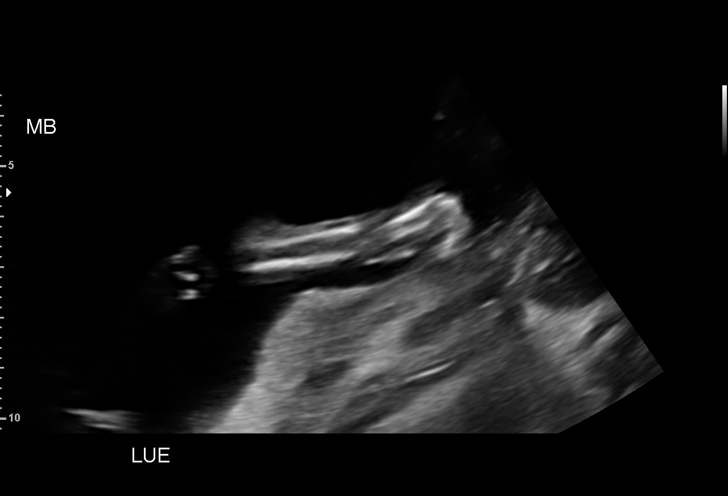
[im 83/90]
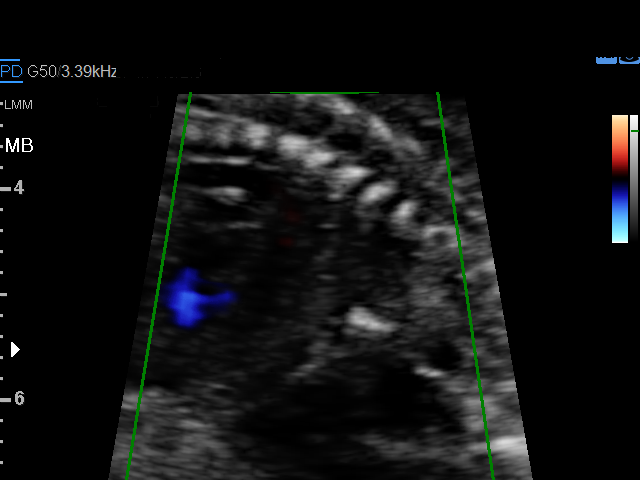
[im 90/90]
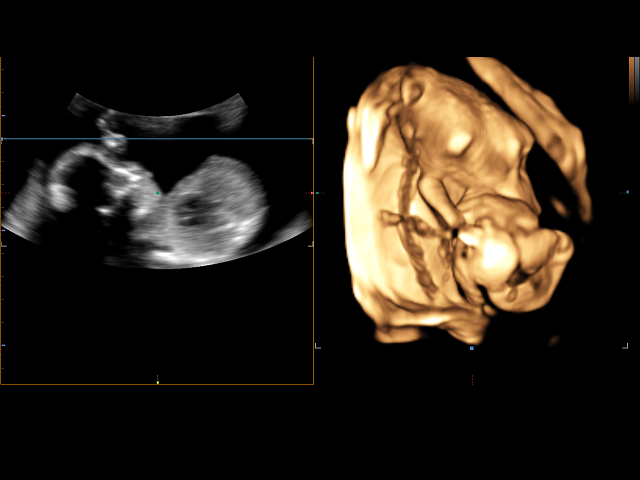

[14 of 28 positions shown; findings below may reference images not displayed]

OB/Gyn Clinic

Indications

18 weeks gestation of pregnancy
Poor obstetric history: Previous preterm
delivery, antepartum 34 weeks (17p
injections)
Previous cesarean delivery, antepartum x 2
Obesity complicating pregnancy, second
trimester
Encounter for antenatal screening for
malformations
OB History

Blood Type:            Height:  5'3"   Weight (lb):  228      BMI:
Gravidity:    4         Term:   1        Prem:   1
Living:       2
Fetal Evaluation

Num Of Fetuses:     1
Fetal Heart         149
Rate(bpm):
Cardiac Activity:   Observed
Presentation:       Cephalic
Placenta:           Posterior, above cervical os
P. Cord Insertion:  Visualized, central
Amniotic Fluid
AFI FV:      Subjectively within normal limits

Largest Pocket(cm)
6.82
Biometry

BPD:      46.6  mm     G. Age:  20w 1d         94  %    CI:        76.34   %   70 - 86
FL/HC:      16.7   %   16.1 -
HC:       169   mm     G. Age:  19w 4d         80  %    HC/AC:      1.21       1.09 -
AC:      139.6  mm     G. Age:  19w 3d         67  %    FL/BPD:     60.5   %
FL:       28.2  mm     G. Age:  18w 5d         41  %    FL/AC:      20.2   %   20 - 24
HUM:      28.5  mm     G. Age:  19w 1d         65  %
CER:      19.5  mm     G. Age:  18w 5d         51  %
NFT:       2.2  mm

CM:        2.9  mm

Est. FW:     274  gm    0 lb 10 oz      52  %
Gestational Age

LMP:           18w 5d       Date:   09/15/16                 EDD:   06/22/17
U/S Today:     19w 3d                                        EDD:   06/17/17
Best:          18w 5d    Det. By:   LMP  (09/15/16)          EDD:   06/22/17
Anatomy

Cranium:               Appears normal         Aortic Arch:            Appears normal
Cavum:                 Appears normal         Ductal Arch:            Appears normal
Ventricles:            Appears normal         Diaphragm:              Appears normal
Choroid Plexus:        Appears normal         Stomach:                Appears normal, left
sided
Cerebellum:            Appears normal         Abdomen:                Appears normal
Posterior Fossa:       Appears normal         Abdominal Wall:         Appears nml (cord
insert, abd wall)
Nuchal Fold:           Appears normal         Cord Vessels:           Appears normal (3
vessel cord)
Face:                  Appears normal         Kidneys:                Appear normal
(orbits and profile)
Lips:                  Appears normal         Bladder:                Appears normal
Thoracic:              Appears normal         Spine:                  Appears normal
Heart:                 Appears normal         Upper Extremities:      Appears normal
(4CH, axis, and situs
RVOT:                  Appears normal         Lower Extremities:      Appears normal
LVOT:                  Appears normal

Other:  Fetus appears to be a male. Heels and 5th digit appears normal.
Cervix Uterus Adnexa

Cervix
Length:           4.15  cm.
Normal appearance by transabdominal scan.

Uterus
No abnormality visualized.

Left Ovary
Not visualized.
Right Ovary
Not visualized.

Cul De Sac:   No free fluid seen.

Adnexa:       No abnormality visualized.
Impression

SIUP at 18+5 weeks
Normal detailed fetal anatomy
Markers of aneuploidy: none
Normal amniotic fluid volume
Measurements consistent with LMP dating
Recommendations

Follow-up ultrasounds as clinically indicated.

## 2018-10-01 ENCOUNTER — Ambulatory Visit: Payer: Self-pay | Admitting: Sports Medicine

## 2018-12-12 ENCOUNTER — Ambulatory Visit: Payer: Medicaid Other | Admitting: Obstetrics

## 2019-01-06 ENCOUNTER — Other Ambulatory Visit: Payer: Self-pay

## 2019-01-06 ENCOUNTER — Emergency Department (HOSPITAL_COMMUNITY): Payer: Medicaid Other

## 2019-01-06 ENCOUNTER — Emergency Department (HOSPITAL_COMMUNITY)
Admission: EM | Admit: 2019-01-06 | Discharge: 2019-01-06 | Disposition: A | Discharge status: Home / Self Care | Payer: Medicaid Other | Attending: Emergency Medicine | Admitting: Emergency Medicine

## 2019-01-06 ENCOUNTER — Encounter (HOSPITAL_COMMUNITY): Payer: Self-pay

## 2019-01-06 DIAGNOSIS — J45909 Unspecified asthma, uncomplicated: Secondary | ICD-10-CM | POA: Insufficient documentation

## 2019-01-06 DIAGNOSIS — R319 Hematuria, unspecified: Secondary | ICD-10-CM

## 2019-01-06 DIAGNOSIS — N390 Urinary tract infection, site not specified: Secondary | ICD-10-CM

## 2019-01-06 DIAGNOSIS — Z91040 Latex allergy status: Secondary | ICD-10-CM | POA: Insufficient documentation

## 2019-01-06 DIAGNOSIS — Z79899 Other long term (current) drug therapy: Secondary | ICD-10-CM | POA: Insufficient documentation

## 2019-01-06 LAB — URINALYSIS, ROUTINE W REFLEX MICROSCOPIC
Bilirubin Urine: NEGATIVE
Glucose, UA: NEGATIVE mg/dL
Ketones, ur: NEGATIVE mg/dL
Nitrite: NEGATIVE
Protein, ur: 100 mg/dL — AB
RBC / HPF: >50 RBC/hpf — ABNORMAL HIGH (ref 0–5)
Specific Gravity, Urine: 1.021 (ref 1.005–1.030)
WBC, UA: >50 WBC/hpf — ABNORMAL HIGH (ref 0–5)
pH: 7 (ref 5.0–8.0)

## 2019-01-06 LAB — CBC WITH DIFFERENTIAL/PLATELET
Abs Immature Granulocytes: 0.02 10*3/uL (ref 0.00–0.07)
Basophils Absolute: 0 10*3/uL (ref 0.0–0.1)
Basophils Relative: 0 %
Eosinophils Absolute: 0.2 10*3/uL (ref 0.0–0.5)
Eosinophils Relative: 2 %
HCT: 41.7 % (ref 36.0–46.0)
Hemoglobin: 12.9 g/dL (ref 12.0–15.0)
Immature Granulocytes: 0 %
Lymphocytes Relative: 24 %
Lymphs Abs: 2.3 10*3/uL (ref 0.7–4.0)
MCH: 26.3 pg (ref 26.0–34.0)
MCHC: 30.9 g/dL (ref 30.0–36.0)
MCV: 84.9 fL (ref 80.0–100.0)
Monocytes Absolute: 0.4 10*3/uL (ref 0.1–1.0)
Monocytes Relative: 4 %
Neutro Abs: 6.7 10*3/uL (ref 1.7–7.7)
Neutrophils Relative %: 70 %
Platelets: 323 10*3/uL (ref 150–400)
RBC: 4.91 MIL/uL (ref 3.87–5.11)
RDW: 13.4 % (ref 11.5–15.5)
WBC: 9.7 10*3/uL (ref 4.0–10.5)
nRBC: 0 % (ref 0.0–0.2)

## 2019-01-06 LAB — BASIC METABOLIC PANEL
Anion gap: 9 (ref 5–15)
BUN: 12 mg/dL (ref 6–20)
CO2: 23 mmol/L (ref 22–32)
Calcium: 8.8 mg/dL — ABNORMAL LOW (ref 8.9–10.3)
Chloride: 106 mmol/L (ref 98–111)
Creatinine, Ser: 0.78 mg/dL (ref 0.44–1.00)
GFR calc Af Amer: >60 mL/min (ref >60)
GFR calc non Af Amer: >60 mL/min (ref >60)
Glucose, Bld: 126 mg/dL — ABNORMAL HIGH (ref 70–99)
Potassium: 3.6 mmol/L (ref 3.5–5.1)
Sodium: 138 mmol/L (ref 135–145)

## 2019-01-06 LAB — WET PREP, GENITAL
Clue Cells Wet Prep HPF POC: NONE SEEN
Sperm: NONE SEEN
Trich, Wet Prep: NONE SEEN
WBC, Wet Prep HPF POC: NONE SEEN
Yeast Wet Prep HPF POC: NONE SEEN

## 2019-01-06 LAB — POC URINE PREG, ED: Preg Test, Ur: NEGATIVE

## 2019-01-06 MED ORDER — CEPHALEXIN 500 MG PO CAPS: 500.0000 mg | ORAL_CAPSULE | Freq: Four times a day (QID) | ORAL | 0 refills | Status: DC

## 2019-01-06 MED ORDER — KETOROLAC TROMETHAMINE 30 MG/ML IJ SOLN
30.0000 mg | Freq: Once | INTRAMUSCULAR | Status: AC
  Administered 2019-01-06: 30 mg via INTRAMUSCULAR
  Filled 2019-01-06: qty 1

## 2019-01-06 NOTE — ED Triage Notes (Signed)
Pt here with complaints of blood in her urine since this morning, admits to painful and burning frequent urination. Some tenderness noted to lower abdomen. Pt denies fever.

## 2019-01-06 NOTE — ED Provider Notes (Signed)
Medical screening examination/treatment/procedure(s) were conducted as a shared visit with non-physician practitioner(s) and myself.  I personally evaluated the patient during the encounter.  None Patient was painless urination.  Cramping suprapubic discomfort.  Reports blood in the urine.  She is alert and nontoxic.  Well in appearance.  Abdomen is soft without guarding.  No flank pain.  No skin or soft tissue abnormalities to palpation of the flank.  I have reviewed the patient's CT scans with her.  At this time no signs of retained stone.  Pelvic cultures have been obtained.  Patient does not have significant pain suggesting need for empiric treatment.  I agree with plan of management.   Charlesetta Shanks, MD 01/06/19 [id number]

## 2019-01-06 NOTE — Discharge Instructions (Addendum)
Please read attached information. If you experience any new or worsening signs or symptoms please return to the emergency room for evaluation. Please follow-up with your primary care provider or specialist as discussed. Please use medication prescribed only as directed and discontinue taking if you have any concerning signs or symptoms.   °

## 2019-01-06 NOTE — ED Provider Notes (Signed)
[facility] EMERGENCY DEPARTMENT Provider Note   CSN: [id number] Arrival date & time: 01/06/19  1326     History   Chief Complaint Chief Complaint  Patient presents with  . Hematuria    HPI Connie Jenkins is a 38 y.o. female.  HPI   38 year old female presents today with complaints of dysuria.  Patient is using the bathroom in the middle night when she had a cramping pain in her bladder.  She notes blood in her urine.  She notes urinary frequency.  She notes minor back pain earlier, none presently.  She denies any abdominal pain nausea vomiting or fever.  She denies any vaginal discharge.  She does note a history of kidney stones previously that felt similar. She also reports that she is sexually active and had unprotected sex 2 weeks ago and would like testing for STD.  She denies any vaginal discharge or bleeding.  Past Medical History:  Diagnosis Date  . Anxiety    no meds  . Asthma    inhaler rarely used - twice year  . Depression    no current Tx.  . Febrile illness, acute 01/2011   Hospitalized for 6 days  . Gestational diabetes   . Headache   . Herpes 2008  . Hx of bronchitis 09/2012  . IBS (irritable bowel syndrome)   . Renal stone 08/26/2014  . Seasonal allergies     Patient Active Problem List   Diagnosis Date Noted  . Encounter for other contraceptive management 07/18/2017  . S/P cesarean section 06/15/2017  . Gestational diabetes mellitus (GDM) affecting pregnancy 04/08/2017  . Migraines 11/13/2016  . 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 CESAREAN SECTION  2000 & 2004   "1st one I dilated 1cm, then the 2nd one was a rpt  . CESAREAN SECTION N/A 06/15/2017   Procedure: CESAREAN SECTION;  Surgeon: Osborne Oman, MD;  Location: [facility];  Service: Obstetrics;  Laterality: N/A;  . CYSTOSCOPY WITH RETROGRADE PYELOGRAM, URETEROSCOPY AND STENT PLACEMENT Left 08/26/2014   Procedure: CYSTOSCOPY WITH RETROGRADE PYELOGRAM, URETEROSCOPY, LASER, STONE EXTRACTION WITH BASKET;  Surgeon: Raynelle Bring, MD;  Location: [facility];  Service: Urology;  Laterality: Left;  . HERNIA REPAIR    . HYSTEROSCOPY N/A 03/24/2013   Procedure: HYSTEROSCOPY / Dilitation and curettage/endometrial curettings;  Surgeon: Lavonia Drafts, MD;  Location: [facility];  Service: Gynecology;  Laterality: N/A;  Diagnostic hysteroscopy  . TMJ ARTHROPLASTY    . WISDOM TOOTH EXTRACTION       OB History    Gravida  3   Para  3   Term  2   Preterm  1   AB  0   Living  3     SAB  0   TAB      Ectopic      Multiple  0   Live Births  3            Home Medications    Prior to Admission medications   Medication Sig Start Date End Date Taking? Authorizing Provider  cephALEXin (KEFLEX) 500 MG capsule Take 1 capsule (500 mg total) by mouth 4 (four) times daily. 01/06/19   Leaira Fullam, Dellis Filbert, PA-C  medroxyPROGESTERone (DEPO-PROVERA) 150 MG/ML injection Inject 1 mL (150 mg total) into the muscle once. 07/18/17 07/18/17  Chancy Milroy, MD  meloxicam (MOBIC) 15 MG tablet Take 1 tablet (15 mg total) by mouth daily. Patient not  taking: Reported on 02/05/2018 01/08/18   Landis Martins, DPM  Norethindrone-Ethinyl Estradiol-Fe Biphas (LO LOESTRIN FE) 1 MG-10 MCG / 10 MCG tablet Take 1 tablet by mouth daily. Start taking pills on the first day of period. Patient not taking: Reported on 02/05/2018 11/08/17   Shelly Bombard, MD  Prenat-FeAsp-Meth-FA-DHA w/o A (PRENATE PIXIE) 10-0.6-0.4-200 MG CAPS Take 1 capsule by mouth daily before breakfast. Patient not taking: Reported on 02/28/2018 11/08/17   Shelly Bombard, MD  Prenatal Vit-Fe Fumarate-FA (PRENATAL PO) Take 1 tablet by mouth daily.    [provider]  terconazole (TERAZOL 3) 0.8 % vaginal cream Place 1 applicator vaginally at bedtime. Patient not taking: Reported on 02/05/2018 11/30/17   Shelly Bombard, MD  valACYclovir (VALTREX)  1000 MG tablet Take 1 tablet ( 1000 mg ) po daily prn each outbreak. Patient not taking: Reported on 02/28/2018 11/08/17   Shelly Bombard, MD    Family History Family History  Problem Relation Age of Onset  . Hypertension Mother   . Diabetes Mother     Social History Social History   Tobacco Use  . Smoking status: Never Smoker  . Smokeless tobacco: Never Used  Substance Use Topics  . Alcohol use: No    Alcohol/week: 1.0 standard drinks    Types: 1 Standard drinks or equivalent per week    Comment: before +UPT   . Drug use: No     Allergies   Flagyl [metronidazole]; Latex; and Penicillins   Review of Systems Review of Systems  All other systems reviewed and are negative.    Physical Exam Updated Vital Signs BP (!) 129/91 (BP Location: Right Arm)   Pulse 72   Temp 98.7 F (37.1 C)   Resp 16   Ht 5\' 3"  (1.6 m)   Wt 101.2 kg   SpO2 96%   BMI 39.50 kg/m   Physical Exam Vitals signs and nursing note reviewed.  Constitutional:      Appearance: She is well-developed.  HENT:     Head: Normocephalic and atraumatic.  Eyes:     General: No scleral icterus.       Right eye: No discharge.        Left eye: No discharge.     Conjunctiva/sclera: Conjunctivae normal.     Pupils: Pupils are equal, round, and reactive to light.  Neck:     Musculoskeletal: Normal range of motion.     Vascular: No JVD.     Trachea: No tracheal deviation.  Pulmonary:     Effort: Pulmonary effort is normal.     Breath sounds: No stridor.  Abdominal:     Comments: No CVA tenderness- tenderness to palpation of the suprapubic region  Neurological:     Mental Status: She is alert and oriented to person, place, and time.     Coordination: Coordination normal.  Psychiatric:        Behavior: Behavior normal.        Thought Content: Thought content normal.        Judgment: Judgment normal.      ED Treatments / Results  Labs (all labs ordered are listed, but only abnormal results  are displayed) Labs Reviewed  URINE CULTURE - Abnormal; Notable for the following components:      Result Value   Culture   (*)    Value: >=100,000 COLONIES/mL PROTEUS MIRABILIS SUSCEPTIBILITIES TO FOLLOW Performed at [facility], [address], [place], [place] [address]  All other components within normal limits  URINALYSIS, ROUTINE W REFLEX MICROSCOPIC - Abnormal; Notable for the following components:   Color, Urine AMBER (*)    APPearance HAZY (*)    Hgb urine dipstick LARGE (*)    Protein, ur 100 (*)    Leukocytes, UA MODERATE (*)    RBC / HPF >50 (*)    WBC, UA >50 (*)    Bacteria, UA MANY (*)    All other components within normal limits  BASIC METABOLIC PANEL - Abnormal; Notable for the following components:   Glucose, Bld 126 (*)    Calcium 8.8 (*)    All other components within normal limits  WET PREP, GENITAL  HIV ANTIBODY (ROUTINE TESTING W REFLEX)  RPR  CBC WITH DIFFERENTIAL/PLATELET  POC URINE PREG, ED  GC/CHLAMYDIA PROBE AMP ([place]) NOT AT [facility]    EKG None  Radiology Ct Renal Stone Study  Result Date: 01/06/2019 CLINICAL DATA:  Initial evaluation for acute back pain with urination. EXAM: CT ABDOMEN AND PELVIS WITHOUT CONTRAST TECHNIQUE: Multidetector CT imaging of the abdomen and pelvis was performed following the standard protocol without IV contrast. COMPARISON:  Prior CT from 08/24/2014. FINDINGS: Lower chest: Mild subsegmental atelectatic changes seen dependently within the visualized lung bases. Visualized lungs are otherwise clear. Hepatobiliary: Limited noncontrast evaluation liver is unremarkable. Gallbladder within normal limits. No biliary dilatation. Pancreas: Pancreas within normal limits. Spleen: Spleen within normal limits. Adrenals/Urinary Tract: Adrenal glands are normal. Punctate 2 mm nonobstructive stone present within the lower pole left kidney. No hydronephrosis or significant hydroureter. No definite radiopaque calculi seen  along the course of either renal collecting system. Multiple vascular phleboliths noted within the pelvis. Stomach/Bowel: Stomach within normal limits. No evidence for bowel obstruction. Normal appendix. Few colonic diverticula noted within the ascending colon. No acute inflammatory changes seen about the bowels. Vascular/Lymphatic: Intra-abdominal aorta of normal caliber. Minimal aortic atherosclerosis. Mildly prominent inguinal lymph nodes measure up to 16 mm on the left and 15 mm on the right, indeterminate, but could be reactive. No other adenopathy within the abdomen and pelvis. Reproductive: Uterus and ovaries within normal limits. Other: No free air or fluid. Sequelae of prior hernia repair noted at the paraumbilical region. Musculoskeletal: Prominent skin thickening with underlying multifocal soft tissue density within the subcutaneous fat of the bilateral gluteal regions, nonspecific. Finding is mildly progressed relative to 2015. No acute osseous finding. No discrete lytic or blastic osseous lesions. IMPRESSION: 1. 2 mm nonobstructive left renal nephrolithiasis. No definite ureterolithiasis or evidence for obstructive uropathy. 2. Colonic diverticulosis without evidence for acute diverticulitis. 3. Mildly enlarged bilateral inguinal lymph nodes, nonspecific, but could be reactive in nature. 4. Diffuse skin thickening with underlying nodular soft tissue density within the subcutaneous fat of the bilateral flanks, nonspecific, and mildly progressed relative to 2015. Correlation with history and physical exam suggested. Electronically Signed   By: Jeannine Boga M.D.   On: 01/06/2019 16:06    Procedures Procedures (including critical care time)  Medications Ordered in ED Medications  ketorolac (TORADOL) 30 MG/ML injection 30 mg (30 mg Intramuscular Given 01/06/19 1604)     Initial Impression / Assessment and Plan / ED Course  I have reviewed the triage vital signs and the nursing  notes.  Pertinent labs & imaging results that were available during my care of the patient were reviewed by me and considered in my medical decision making (see chart for details).     Labs:   Imaging: CT renal  Consults:  Therapeutics:  Discharge Meds:   Assessment/Plan: 38 year old female presents today with suprapubic pain and dysuria.  Patient's urine consistent with urinary tract infection.  Patient notes this feels similar to previous kidney stones.  She notes in the past that she had been treated for a period of time prior to diagnosis of kidney stone.  I do find it is reasonable to rule out obstructing pathology given her infected urine.  Patient care will be signed to oncoming provider pending CT.    Final Clinical Impressions(s) / ED Diagnoses   Final diagnoses:  Urinary tract infection with hematuria, site unspecified    ED Discharge Orders         Ordered    cephALEXin (KEFLEX) 500 MG capsule  4 times daily     01/06/19 1457           Okey Regal, PA-C 01/08/19 0802    Charlesetta Shanks, MD 01/10/19 1308

## 2019-01-07 LAB — RPR: RPR Ser Ql: NONREACTIVE

## 2019-01-07 LAB — GC/CHLAMYDIA PROBE AMP (~~LOC~~) NOT AT ARMC
Chlamydia: NEGATIVE
Neisseria Gonorrhea: NEGATIVE

## 2019-01-07 LAB — HIV ANTIBODY (ROUTINE TESTING W REFLEX): HIV Screen 4th Generation wRfx: NONREACTIVE

## 2019-01-08 LAB — URINE CULTURE: Culture: >=100000 — AB

## 2019-01-09 ENCOUNTER — Telehealth: Payer: Self-pay | Admitting: *Deleted

## 2019-01-09 NOTE — Telephone Encounter (Signed)
Post ED Visit - Positive Culture Follow-up  Culture report reviewed by antimicrobial stewardship pharmacist:  []  Elenor Quinones, Pharm.D. []  Heide Guile, Pharm.D., BCPS AQ-ID []  Parks Neptune, Pharm.D., BCPS []  Alycia Rossetti, Pharm.D., BCPS []  [place], Pharm.D., BCPS, AAHIVP []  Legrand Como, Pharm.D., BCPS, AAHIVP []  Salome Arnt, PharmD, BCPS []  Johnnette Gourd, PharmD, BCPS []  Hughes Better, PharmD, BCPS []  Leeroy Cha, PharmD Laqueta Linden, PharmD  Positive urine culture Treated with Cephalexin, organism sensitive to the same and no further patient follow-up is required at this time.  Harlon Flor [facility] 01/09/2019, 11:41 AM

## 2019-06-09 ENCOUNTER — Inpatient Hospital Stay (HOSPITAL_COMMUNITY)
Admission: AD | Admit: 2019-06-09 | Discharge: 2019-06-09 | Disposition: A | Discharge status: Home / Self Care | Payer: Medicaid Other | Attending: Obstetrics and Gynecology | Admitting: Obstetrics and Gynecology

## 2019-06-09 ENCOUNTER — Other Ambulatory Visit: Payer: Self-pay

## 2019-06-09 DIAGNOSIS — R102 Pelvic and perineal pain: Secondary | ICD-10-CM | POA: Insufficient documentation

## 2019-06-09 DIAGNOSIS — Z3202 Encounter for pregnancy test, result negative: Secondary | ICD-10-CM

## 2019-06-09 LAB — POCT PREGNANCY, URINE
Preg Test, Ur: NEGATIVE
Preg Test, Ur: NEGATIVE

## 2019-06-09 NOTE — MAU Note (Signed)
Provider gave pt results and follow up care instructions.

## 2019-06-09 NOTE — MAU Note (Signed)
Pt stated she has had pelvic pain and c/o urine smelling like ammonia since the weekend. Has not had a period for 5 weeks and had unprotected sex during that time. Also have breast tenderness.

## 2019-06-09 NOTE — MAU Provider Note (Signed)
First Provider Initiated Contact with Patient 06/09/19 1414      S [name] is a 38 y.o. [phone] non-pregnant female who presents to MAU today with complaint of pelvic pain and foul odor with her urine.  O BP 128/74   Pulse 69   Temp 98.3 F (36.8 C)   Resp 18   Ht 5\' 2"  (1.575 m)   LMP 04/24/2019 (Within Days)   BMI 40.79 kg/m  Physical Exam  Nursing note and vitals reviewed. Constitutional: She is oriented to person, place, and time. She appears well-developed and well-nourished.  Neck: Normal range of motion.  Cardiovascular: Normal rate and regular rhythm.  Respiratory: Effort normal and breath sounds normal.  GI: Soft.  Musculoskeletal: Normal range of motion.  Neurological: She is alert and oriented to person, place, and time.  Skin: Skin is warm and dry.  Psychiatric: She has a normal mood and affect. Her behavior is normal. Judgment and thought content normal.    A Non pregnant female Medical screening exam complete 1. Pelvic pain in female   2. Negative pregnancy test     P Discharge from MAU in stable condition Patient given the option of transfer to [facility] for further evaluation or seek care in outpatient facility of choice List of options for follow-up given  Warning signs for worsening condition that would warrant emergency follow-up discussed Patient may return to MAU as needed for pregnancy related complaints  Elvera Maria, CNM 06/09/2019 2:16 PM

## 2019-06-10 ENCOUNTER — Other Ambulatory Visit: Payer: Self-pay | Admitting: *Deleted

## 2019-06-10 DIAGNOSIS — Z20822 Contact with and (suspected) exposure to covid-19: Secondary | ICD-10-CM

## 2019-06-15 LAB — NOVEL CORONAVIRUS, NAA: SARS-CoV-2, NAA: NOT DETECTED

## 2019-07-13 ENCOUNTER — Emergency Department (HOSPITAL_BASED_OUTPATIENT_CLINIC_OR_DEPARTMENT_OTHER)
Admission: EM | Admit: 2019-07-13 | Discharge: 2019-07-13 | Disposition: A | Discharge status: Home / Self Care | Payer: Medicaid Other | Attending: Emergency Medicine | Admitting: Emergency Medicine

## 2019-07-13 ENCOUNTER — Emergency Department (HOSPITAL_BASED_OUTPATIENT_CLINIC_OR_DEPARTMENT_OTHER): Payer: Medicaid Other

## 2019-07-13 ENCOUNTER — Encounter (HOSPITAL_BASED_OUTPATIENT_CLINIC_OR_DEPARTMENT_OTHER): Payer: Self-pay | Admitting: *Deleted

## 2019-07-13 ENCOUNTER — Other Ambulatory Visit: Payer: Self-pay

## 2019-07-13 DIAGNOSIS — B9689 Other specified bacterial agents as the cause of diseases classified elsewhere: Secondary | ICD-10-CM

## 2019-07-13 DIAGNOSIS — N760 Acute vaginitis: Secondary | ICD-10-CM | POA: Insufficient documentation

## 2019-07-13 DIAGNOSIS — R102 Pelvic and perineal pain: Secondary | ICD-10-CM | POA: Insufficient documentation

## 2019-07-13 LAB — URINALYSIS, MICROSCOPIC (REFLEX)

## 2019-07-13 LAB — URINALYSIS, ROUTINE W REFLEX MICROSCOPIC
Bilirubin Urine: NEGATIVE
Glucose, UA: NEGATIVE mg/dL
Ketones, ur: NEGATIVE mg/dL
Leukocytes,Ua: NEGATIVE
Nitrite: NEGATIVE
Protein, ur: NEGATIVE mg/dL
Specific Gravity, Urine: 1.025 (ref 1.005–1.030)
pH: 6 (ref 5.0–8.0)

## 2019-07-13 LAB — PREGNANCY, URINE: Preg Test, Ur: NEGATIVE

## 2019-07-13 LAB — WET PREP, GENITAL
Sperm: NONE SEEN
Trich, Wet Prep: NONE SEEN
Yeast Wet Prep HPF POC: NONE SEEN

## 2019-07-13 MED ORDER — CEFTRIAXONE SODIUM 250 MG IJ SOLR
250.0000 mg | Freq: Once | INTRAMUSCULAR | Status: AC
  Administered 2019-07-13: 250 mg via INTRAMUSCULAR
  Filled 2019-07-13: qty 250

## 2019-07-13 MED ORDER — CLINDAMYCIN HCL 300 MG PO CAPS: 300.0000 mg | ORAL_CAPSULE | Freq: Two times a day (BID) | ORAL | 0 refills | Status: AC

## 2019-07-13 MED ORDER — LIDOCAINE HCL (PF) 1 % IJ SOLN
INTRAMUSCULAR | Status: AC
  Administered 2019-07-13: 1 mL
  Filled 2019-07-13: qty 5

## 2019-07-13 MED ORDER — AZITHROMYCIN 1 G PO PACK
1.0000 g | PACK | Freq: Once | ORAL | Status: AC
  Administered 2019-07-13: 1 g via ORAL
  Filled 2019-07-13: qty 1

## 2019-07-13 MED ORDER — CEFTRIAXONE SODIUM 250 MG IJ SOLR: 250.0000 mg | Freq: Once | INTRAMUSCULAR | 0 refills | Status: DC

## 2019-07-13 NOTE — ED Notes (Signed)
Will hold po Zithromax until U/S resulted, ED MD aware

## 2019-07-13 NOTE — ED Notes (Signed)
Patient transported to Ultrasound 

## 2019-07-13 NOTE — ED Notes (Signed)
Report to next shift

## 2019-07-13 NOTE — ED Provider Notes (Signed)
Patient signed out to me awaiting pelvic ultrasound to rule out fibroid/other intrapelvic pain pathology.  Patient empirically to be treated for gonorrhea and chlamydia.  Wet prep is positive for bacterial vaginosis.  We will treat that with antibiotics.  Patient did not have any signs to suggest PID per overnight physician.  Urinalysis unremarkable.  Pregnancy test negative.  Awaiting pelvic ultrasound and anticipate discharge to home.  Symptoms possibly secondary to bacterial vaginosis.  Ultrasound overall unremarkable.  No evidence of torsion.  No cysts.  Likely symptoms secondary to bacterial vaginosis.  Given return precautions and discharged in ED in good condition.  This chart was dictated using voice recognition software.  Despite best efforts to proofread,  errors can occur which can change the documentation meaning.     Lennice Sites, DO 07/13/19 [phone]

## 2019-07-13 NOTE — ED Provider Notes (Signed)
[facility] Provider Note: Georgena Spurling, MD, FACEP  CSN: [id number] MRN: [id number] ARRIVAL: 07/13/19 at [place]: MHOTF/OTF   CHIEF COMPLAINT  Urinary Frequency   HISTORY OF PRESENT ILLNESS  07/13/19 6:27 AM Connie Jenkins is a 38 y.o. female with several days of urinary frequency and burning with urination.  She rates associated pain as a 9 out of 10.  She has not taken anything for her symptoms.  She also has low back pain but no fever or chills.  She is also having a vaginal discharge.    Past Medical History:  Diagnosis Date  . Anxiety    no meds  . Asthma    inhaler rarely used - twice year  . Depression    no current Tx.  . Febrile illness, acute 01/2011   Hospitalized for 6 days  . Gestational diabetes   . Headache   . Herpes 2008  . Hx of bronchitis 09/2012  . IBS (irritable bowel syndrome)   . Renal stone 08/26/2014  . Seasonal allergies     Past Surgical History:  Procedure Laterality Date  . CESAREAN SECTION  2000 & 2004   "1st one I dilated 1cm, then the 2nd one was a rpt  . CESAREAN SECTION N/A 06/15/2017   Procedure: CESAREAN SECTION;  Surgeon: Osborne Oman, MD;  Location: [facility];  Service: Obstetrics;  Laterality: N/A;  . CYSTOSCOPY WITH RETROGRADE PYELOGRAM, URETEROSCOPY AND STENT PLACEMENT Left 08/26/2014   Procedure: CYSTOSCOPY WITH RETROGRADE PYELOGRAM, URETEROSCOPY, LASER, STONE EXTRACTION WITH BASKET;  Surgeon: Raynelle Bring, MD;  Location: [facility];  Service: Urology;  Laterality: Left;  . HERNIA REPAIR    . HYSTEROSCOPY N/A 03/24/2013   Procedure: HYSTEROSCOPY / Dilitation and curettage/endometrial curettings;  Surgeon: Lavonia Drafts, MD;  Location: [facility];  Service: Gynecology;  Laterality: N/A;  Diagnostic hysteroscopy  . TMJ ARTHROPLASTY    . WISDOM TOOTH EXTRACTION      Family History  Problem Relation Age of Onset  . Hypertension Mother   . Diabetes Mother     Social History   Tobacco Use  .  Smoking status: Never Smoker  . Smokeless tobacco: Never Used  Substance Use Topics  . Alcohol use: No    Alcohol/week: 1.0 standard drinks    Types: 1 Standard drinks or equivalent per week    Comment: before +UPT   . Drug use: No    Prior to Admission medications   Medication Sig Start Date End Date Taking? Authorizing Provider  clindamycin (CLEOCIN) 300 MG capsule Take 1 capsule (300 mg total) by mouth 2 (two) times daily for 7 days. 07/13/19 07/20/19  Curatolo, Adam, DO  valACYclovir (VALTREX) 1000 MG tablet Take 1 tablet ( 1000 mg ) po daily prn each outbreak. Patient not taking: Reported on 02/28/2018 11/08/17   Shelly Bombard, MD    Allergies Flagyl [metronidazole], Latex, and Penicillins   REVIEW OF SYSTEMS  Negative except as noted here or in the History of Present Illness.   PHYSICAL EXAMINATION  Initial Vital Signs Blood pressure 129/84, pulse 92, temperature 97.7 F (36.5 C), temperature source Oral, resp. rate 18, height 5\' 3"  (1.6 m), weight 104.3 kg, last menstrual period 06/29/2019, SpO2 100 %.  Examination General: Well-developed, well-nourished female in no acute distress; appearance consistent with age of record HENT: normocephalic; atraumatic Eyes: pupils equal, round and reactive to light; extraocular muscles intact Neck: supple Heart: regular rate and rhythm Lungs: clear to auscultation bilaterally  Abdomen: soft; nondistended; suprapubic tenderness; bowel sounds present GU: Normal external genitalia; no vaginal bleeding; physiologic appearing vaginal discharge; generalized pelvic tenderness most prominent in the bladder; stool in rectum palpable through posterior vaginal wall Extremities: No deformity; full range of motion; pulses normal Neurologic: Awake, alert and oriented; motor function intact in all extremities and symmetric; no facial droop Skin: Warm and dry Psychiatric: Normal mood and affect   RESULTS  Summary of this visit's results,  reviewed by myself:   EKG Interpretation  Date/Time:    Ventricular Rate:    PR Interval:    QRS Duration:   QT Interval:    QTC Calculation:   R Axis:     Text Interpretation:        Laboratory Studies: No results found for this or any previous visit (from the past 24 hour(s)). Imaging Studies: No results found.  ED COURSE and MDM  Nursing notes and initial vitals signs, including pulse oximetry, reviewed.  Vitals:   07/13/19 0623 07/13/19 0626 07/13/19 0933  BP:  129/84 (!) 130/92  Pulse:  92 77  Resp:  18 16  Temp:  97.7 F (36.5 C)   TempSrc:  Oral   SpO2:  100% 100%  Weight: 104.3 kg    Height: 5\' 3"  (1.6 m)     7:00 AM Signed out to Dr. Ronnald Nian. US pelvis pending.  PROCEDURES    ED DIAGNOSES     ICD-10-CM   1. Bacterial vaginosis  N76.0    B96.89   2. Pelvic pain  R10.2 US PELVIC COMPLETE W TRANSVAGINAL AND TORSION R/O    US PELVIC COMPLETE W TRANSVAGINAL AND TORSION R/O       Taleeyah Bora, MD 07/15/19 2242

## 2019-07-13 NOTE — ED Notes (Signed)
Given snack and soda

## 2019-07-13 NOTE — ED Triage Notes (Signed)
Pt states she has had urinary frequency for the past couple days. States pain with urination. Denies fever. Has not taken anything for pain. C/o lower general back pain. C/o vaginal d/c and pain in her pelvic area. Not sure if she has had an STD exposure.

## 2019-07-15 LAB — URINE CULTURE: Culture: 80000 — AB

## 2019-07-15 LAB — GC/CHLAMYDIA PROBE AMP (~~LOC~~) NOT AT ARMC
Chlamydia: NEGATIVE
Neisseria Gonorrhea: NEGATIVE

## 2019-07-16 ENCOUNTER — Telehealth: Payer: Self-pay | Admitting: Emergency Medicine

## 2019-07-16 NOTE — Telephone Encounter (Signed)
Post ED Visit - Positive Culture Follow-up  Culture report reviewed by antimicrobial stewardship pharmacist: [place] Team []  Elenor Quinones, Pharm.D. []  Heide Guile, Pharm.D., BCPS AQ-ID []  Parks Neptune, Pharm.D., BCPS []  Alycia Rossetti, Pharm.D., BCPS []  [place], [place].D., BCPS, AAHIVP []  Legrand Como, Pharm.D., BCPS, AAHIVP []  Salome Arnt, PharmD, BCPS []  Johnnette Gourd, PharmD, BCPS []  Hughes Better, PharmD, BCPS []  Leeroy Cha, PharmD []  Laqueta Linden, PharmD, BCPS []  Albertina Parr, PharmD Nicoletta Dress PharmD  [place] Team []  Leodis Sias, PharmD []  Lindell Spar, PharmD []  Royetta Asal, PharmD []  Graylin Shiver, Rph []  Rema Fendt) Glennon Mac, PharmD []  Arlyn Dunning, PharmD []  Netta Cedars, PharmD []  Dia Sitter, PharmD []  Leone Haven, PharmD []  Gretta Arab, PharmD []  Theodis Shove, PharmD []  Peggyann Juba, PharmD []  Reuel Boom, PharmD   Positive urine culture Treated with ceftriaxone and clindamycin,  no further patient follow-up is required at this time.  Hazle Nordmann 07/16/2019, 11:25 AM

## 2019-12-18 ENCOUNTER — Other Ambulatory Visit: Payer: Self-pay

## 2019-12-18 ENCOUNTER — Emergency Department (HOSPITAL_COMMUNITY)
Admission: EM | Admit: 2019-12-18 | Discharge: 2019-12-18 | Disposition: A | Discharge status: Home / Self Care | Payer: Medicaid Other | Attending: Emergency Medicine | Admitting: Emergency Medicine

## 2019-12-18 ENCOUNTER — Encounter (HOSPITAL_COMMUNITY): Payer: Self-pay | Admitting: Emergency Medicine

## 2019-12-18 DIAGNOSIS — Y999 Unspecified external cause status: Secondary | ICD-10-CM | POA: Insufficient documentation

## 2019-12-18 DIAGNOSIS — Y92039 Unspecified place in apartment as the place of occurrence of the external cause: Secondary | ICD-10-CM | POA: Insufficient documentation

## 2019-12-18 DIAGNOSIS — S91209A Unspecified open wound of unspecified toe(s) with damage to nail, initial encounter: Secondary | ICD-10-CM

## 2019-12-18 DIAGNOSIS — S91202A Unspecified open wound of left great toe with damage to nail, initial encounter: Secondary | ICD-10-CM | POA: Insufficient documentation

## 2019-12-18 DIAGNOSIS — W2209XA Striking against other stationary object, initial encounter: Secondary | ICD-10-CM | POA: Insufficient documentation

## 2019-12-18 DIAGNOSIS — Y9389 Activity, other specified: Secondary | ICD-10-CM | POA: Insufficient documentation

## 2019-12-18 MED ORDER — IBUPROFEN 600 MG PO TABS: 600.0000 mg | ORAL_TABLET | Freq: Four times a day (QID) | ORAL | 0 refills | Status: DC | PRN

## 2019-12-18 MED ORDER — CEPHALEXIN 500 MG PO CAPS: 500.0000 mg | ORAL_CAPSULE | Freq: Three times a day (TID) | ORAL | 0 refills | Status: DC

## 2019-12-18 MED ORDER — LIDOCAINE HCL 2 % IJ SOLN
10.0000 mL | Freq: Once | INTRAMUSCULAR | Status: AC
  Administered 2019-12-18: 200 mg via INTRADERMAL
  Filled 2019-12-18: qty 20

## 2019-12-18 NOTE — ED Notes (Signed)
Patient verbalizes understanding of discharge instructions. Opportunity for questioning and answers were provided. Armband removed by staff, pt discharged from ED. Pt wheeled out to lobby °

## 2019-12-18 NOTE — ED Provider Notes (Signed)
[facility] EMERGENCY DEPARTMENT Provider Note   CSN: [id number] Arrival date & time: 12/18/19  [id number]     History Chief Complaint  Patient presents with  . toe nail injury    Connie Jenkins is a 39 y.o. female.  The history is provided by the patient. No language interpreter was used.      39 year old female presenting for evaluation of toenail injury.  Patient reports that she has acrylic toenails and yesterday while chasing her 75-year-old around the house, she actually stubbed her left great toenail against a cabinet and nearly popped it off.  Last night while laying in bed, she felt throbbing pain to her toe rates as 8 out of 10 nonradiating prompting this ER visit.  She believes her last tetanus was approximately 6 years ago.  She denies any history of diabetes and currently not on any blood thinning medication.  Pain is primarily on the surface of the toe with nail.  No other injury.  No specific treatment tried aside from a Band-Aid. Patient also voiced concern for infection.   Past Medical History:  Diagnosis Date  . Anxiety    no meds  . Asthma    inhaler rarely used - twice year  . Depression    no current Tx.  . Febrile illness, acute 01/2011   Hospitalized for 6 days  . Gestational diabetes   . Headache   . Herpes 2008  . Hx of bronchitis 09/2012  . IBS (irritable bowel syndrome)   . Renal stone 08/26/2014  . Seasonal allergies     Patient Active Problem List   Diagnosis Date Noted  . Encounter for other contraceptive management 07/18/2017  . S/P cesarean section 06/15/2017  . Gestational diabetes mellitus (GDM) affecting pregnancy 04/08/2017  . Migraines 11/13/2016  . 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 CESAREAN SECTION  2000 & 2004   "1st one I dilated 1cm, then the 2nd one was a rpt  . CESAREAN SECTION N/A 06/15/2017   Procedure: CESAREAN SECTION;  Surgeon: Osborne Oman, MD;   Location: [facility];  Service: Obstetrics;  Laterality: N/A;  . CYSTOSCOPY WITH RETROGRADE PYELOGRAM, URETEROSCOPY AND STENT PLACEMENT Left 08/26/2014   Procedure: CYSTOSCOPY WITH RETROGRADE PYELOGRAM, URETEROSCOPY, LASER, STONE EXTRACTION WITH BASKET;  Surgeon: Raynelle Bring, MD;  Location: [facility];  Service: Urology;  Laterality: Left;  . HERNIA REPAIR    . HYSTEROSCOPY N/A 03/24/2013   Procedure: HYSTEROSCOPY / Dilitation and curettage/endometrial curettings;  Surgeon: Lavonia Drafts, MD;  Location: [facility];  Service: Gynecology;  Laterality: N/A;  Diagnostic hysteroscopy  . TMJ ARTHROPLASTY    . WISDOM TOOTH EXTRACTION       OB History    Gravida  3   Para  3   Term  2   Preterm  1   AB  0   Living  3     SAB  0   TAB      Ectopic      Multiple  0   Live Births  3           Family History  Problem Relation Age of Onset  . Hypertension Mother   . Diabetes Mother     Social History   Tobacco Use  . Smoking status: Never Smoker  . Smokeless tobacco: Never Used  Substance Use Topics  . Alcohol use: No    Alcohol/week: 1.0 standard  drinks    Types: 1 Standard drinks or equivalent per week    Comment: before +UPT   . Drug use: No    Home Medications Prior to Admission medications   Medication Sig Start Date End Date Taking? Authorizing Provider  valACYclovir (VALTREX) 1000 MG tablet Take 1 tablet ( 1000 mg ) po daily prn each outbreak. Patient not taking: Reported on 02/28/2018 11/08/17   Shelly Bombard, MD    Allergies    Flagyl [metronidazole], Latex, and Penicillins  Review of Systems   Review of Systems  Constitutional: Negative for fever.  Skin: Positive for wound.  Neurological: Negative for numbness.    Physical Exam Updated Vital Signs BP 107/65   Pulse 86   Temp 98.2 F (36.8 C) (Oral)   Resp 18   SpO2 99%   Physical Exam Vitals and nursing note reviewed.  Constitutional:      General: She is not in acute  distress.    Appearance: She is well-developed.  HENT:     Head: Atraumatic.  Eyes:     Conjunctiva/sclera: Conjunctivae normal.  Musculoskeletal:        General: Signs of injury (L great toe: toe nail wiht acrylic overlay is [id number] avulsed, small amount of dry blood noted at the border.  no joint or bone involvement.  area tender to palpation.  brisk cap refill.) present.     Cervical back: Neck supple.  Skin:    Findings: No rash.  Neurological:     Mental Status: She is alert.     ED Results / Procedures / Treatments   Labs (all labs ordered are listed, but only abnormal results are displayed) Labs Reviewed - No data to display  EKG None  Radiology No results found.  Procedures .Nail Removal  Date/Time: 12/18/2019 8:50 AM Performed by: Domenic Moras, PA-C Authorized by: Domenic Moras, PA-C   Consent:    Consent obtained:  Verbal   Consent given by:  Patient   Risks discussed:  Infection, pain and permanent nail deformity   Alternatives discussed:  No treatment Location:    Foot:  L big toe Pre-procedure details:    Skin preparation:  Betadine   Preparation: Patient was prepped and draped in the usual sterile fashion   Anesthesia (see MAR for exact dosages):    Anesthesia method:  Nerve block   Block location:  Digital nerve block   Block needle gauge:  25 G   Block anesthetic:  Lidocaine 2% w/o epi   Block technique:  Digital nerve block   Block injection procedure:  Anatomic landmarks identified, introduced needle, incremental injection and negative aspiration for blood   Block outcome:  Anesthesia achieved Nail Removal:    Nail removed:  Complete   Removed nail replaced and anchored: no   Trephination:    Subungual hematoma drained: no   Post-procedure details:    Dressing:  Xeroform gauze   Patient tolerance of procedure:  Tolerated well, no immediate complications   (including critical care time)  Medications Ordered in ED Medications  lidocaine  (XYLOCAINE) 2 % (with pres) injection 200 mg (200 mg Intradermal Given by Other 12/18/19 [id number])    ED Course  I have reviewed the triage vital signs and the nursing notes.  Pertinent labs & imaging results that were available during my care of the patient were reviewed by me and considered in my medical decision making (see chart for details).    MDM Rules/Calculators/A&P  BP 107/65   Pulse 86   Temp 98.2 F (36.8 C) (Oral)   Resp 18   SpO2 99%   Final Clinical Impression(s) / ED Diagnoses Final diagnoses:  Avulsed toenail, initial encounter    Rx / DC Orders ED Discharge Orders         Ordered    cephALEXin (KEFLEX) 500 MG capsule  3 times daily     12/18/19 0854    ibuprofen (ADVIL) 600 MG tablet  Every 6 hours PRN     12/18/19 0854         8:02 AM Patient here with toenail injury involving the left great toe.  She has acrylic overlay.  The toenail is approximately 90%'s avulsed from the nail bed.  Plan to apply anesthetic, and remove toenail.  Will apply Xeroform into the eponychial fold to help preserved the growth plate.  Patient also voiced concern for potential infection.  No obvious signs of infection at this time however will prescribe antibiotics to use only in the case of developing infection.  Patient made aware of signs of infection.  Patient is allergic to penicillin with hives.     Domenic Moras, PA-C 12/18/19 [id number]    Charlesetta Shanks, MD 12/19/19 [phone]

## 2019-12-18 NOTE — Discharge Instructions (Addendum)
Follow direction below for care of toenail removal.  Take keflex only if you notice any signs of infection.  You may keep the initial dressing on for 2-4 days, then change dressing daily.

## 2019-12-18 NOTE — ED Triage Notes (Signed)
Pt reports ripping her toe nail off and pain woke her up out of her sleep.

## 2020-04-05 ENCOUNTER — Other Ambulatory Visit: Payer: Self-pay

## 2020-04-05 ENCOUNTER — Emergency Department (HOSPITAL_BASED_OUTPATIENT_CLINIC_OR_DEPARTMENT_OTHER)
Admission: EM | Admit: 2020-04-05 | Discharge: 2020-04-05 | Disposition: A | Discharge status: Home / Self Care | Payer: Self-pay | Attending: Emergency Medicine | Admitting: Emergency Medicine

## 2020-04-05 ENCOUNTER — Emergency Department (HOSPITAL_BASED_OUTPATIENT_CLINIC_OR_DEPARTMENT_OTHER): Payer: Self-pay

## 2020-04-05 ENCOUNTER — Encounter (HOSPITAL_BASED_OUTPATIENT_CLINIC_OR_DEPARTMENT_OTHER): Payer: Self-pay | Admitting: *Deleted

## 2020-04-05 DIAGNOSIS — Z87891 Personal history of nicotine dependence: Secondary | ICD-10-CM | POA: Insufficient documentation

## 2020-04-05 DIAGNOSIS — N739 Female pelvic inflammatory disease, unspecified: Secondary | ICD-10-CM | POA: Insufficient documentation

## 2020-04-05 DIAGNOSIS — J45909 Unspecified asthma, uncomplicated: Secondary | ICD-10-CM | POA: Insufficient documentation

## 2020-04-05 DIAGNOSIS — N730 Acute parametritis and pelvic cellulitis: Secondary | ICD-10-CM

## 2020-04-05 DIAGNOSIS — R300 Dysuria: Secondary | ICD-10-CM | POA: Insufficient documentation

## 2020-04-05 LAB — CBC WITH DIFFERENTIAL/PLATELET
Abs Immature Granulocytes: 0.02 10*3/uL (ref 0.00–0.07)
Basophils Absolute: 0 10*3/uL (ref 0.0–0.1)
Basophils Relative: 0 %
Eosinophils Absolute: 0.2 10*3/uL (ref 0.0–0.5)
Eosinophils Relative: 3 %
HCT: 41.8 % (ref 36.0–46.0)
Hemoglobin: 13.4 g/dL (ref 12.0–15.0)
Immature Granulocytes: 0 %
Lymphocytes Relative: 23 %
Lymphs Abs: 1.6 10*3/uL (ref 0.7–4.0)
MCH: 27.2 pg (ref 26.0–34.0)
MCHC: 32.1 g/dL (ref 30.0–36.0)
MCV: 84.8 fL (ref 80.0–100.0)
Monocytes Absolute: 0.4 10*3/uL (ref 0.1–1.0)
Monocytes Relative: 6 %
Neutro Abs: 4.5 10*3/uL (ref 1.7–7.7)
Neutrophils Relative %: 68 %
Platelets: 284 10*3/uL (ref 150–400)
RBC: 4.93 MIL/uL (ref 3.87–5.11)
RDW: 13.7 % (ref 11.5–15.5)
WBC: 6.8 10*3/uL (ref 4.0–10.5)
nRBC: 0 % (ref 0.0–0.2)

## 2020-04-05 LAB — URINALYSIS, ROUTINE W REFLEX MICROSCOPIC
Bilirubin Urine: NEGATIVE
Glucose, UA: NEGATIVE mg/dL
Ketones, ur: NEGATIVE mg/dL
Nitrite: NEGATIVE
Protein, ur: NEGATIVE mg/dL
Specific Gravity, Urine: >1.03 — ABNORMAL HIGH (ref 1.005–1.030)
pH: 6 (ref 5.0–8.0)

## 2020-04-05 LAB — WET PREP, GENITAL
Sperm: NONE SEEN
Yeast Wet Prep HPF POC: NONE SEEN

## 2020-04-05 LAB — COMPREHENSIVE METABOLIC PANEL
ALT: 30 U/L (ref 0–44)
AST: 50 U/L — ABNORMAL HIGH (ref 15–41)
Albumin: 3.7 g/dL (ref 3.5–5.0)
Alkaline Phosphatase: 50 U/L (ref 38–126)
Anion gap: 7 (ref 5–15)
BUN: 16 mg/dL (ref 6–20)
CO2: 28 mmol/L (ref 22–32)
Calcium: 8.8 mg/dL — ABNORMAL LOW (ref 8.9–10.3)
Chloride: 104 mmol/L (ref 98–111)
Creatinine, Ser: 0.73 mg/dL (ref 0.44–1.00)
GFR calc Af Amer: >60 mL/min (ref >60)
GFR calc non Af Amer: >60 mL/min (ref >60)
Glucose, Bld: 111 mg/dL — ABNORMAL HIGH (ref 70–99)
Potassium: 3.7 mmol/L (ref 3.5–5.1)
Sodium: 139 mmol/L (ref 135–145)
Total Bilirubin: 0.6 mg/dL (ref 0.3–1.2)
Total Protein: 7.6 g/dL (ref 6.5–8.1)

## 2020-04-05 LAB — PREGNANCY, URINE: Preg Test, Ur: NEGATIVE

## 2020-04-05 LAB — URINALYSIS, MICROSCOPIC (REFLEX)

## 2020-04-05 MED ORDER — CLINDAMYCIN HCL 150 MG PO CAPS
450.0000 mg | ORAL_CAPSULE | Freq: Three times a day (TID) | ORAL | 0 refills | Status: AC
Start: 2020-04-05 — End: 2020-04-19

## 2020-04-05 MED ORDER — DOXYCYCLINE HYCLATE 100 MG PO TABS
100.0000 mg | ORAL_TABLET | Freq: Once | ORAL | Status: AC
  Administered 2020-04-05: 10:00:00 100 mg via ORAL
  Filled 2020-04-05: qty 1

## 2020-04-05 MED ORDER — ONDANSETRON HCL 4 MG/2ML IJ SOLN
4.0000 mg | Freq: Once | INTRAMUSCULAR | Status: AC
  Administered 2020-04-05: 4 mg via INTRAVENOUS
  Filled 2020-04-05: qty 2

## 2020-04-05 MED ORDER — DEXTROSE 5 % IV SOLN
500.0000 mg | Freq: Once | INTRAVENOUS | Status: AC
  Administered 2020-04-05: 500 mg via INTRAVENOUS
  Filled 2020-04-05: qty 500

## 2020-04-05 MED ORDER — DOXYCYCLINE HYCLATE 100 MG PO CAPS
100.0000 mg | ORAL_CAPSULE | Freq: Two times a day (BID) | ORAL | 0 refills | Status: AC
Start: 2020-04-05 — End: 2020-04-19

## 2020-04-05 MED ORDER — CLINDAMYCIN HCL 150 MG PO CAPS
450.0000 mg | ORAL_CAPSULE | Freq: Once | ORAL | Status: AC
  Administered 2020-04-05: 450 mg via ORAL
  Filled 2020-04-05: qty 3

## 2020-04-05 MED ORDER — CEFTRIAXONE SODIUM 500 MG IJ SOLR
INTRAMUSCULAR | Status: AC
  Administered 2020-04-05: 500 mg
  Filled 2020-04-05: qty 500

## 2020-04-05 NOTE — ED Notes (Signed)
Patient stated that her vaginal discharges has foul smell and burning sensation during urination.

## 2020-04-05 NOTE — Discharge Instructions (Signed)
Please be sure to take all medication until it is completely gone.  Follow-up with your primary medical doctor or health department to be retested in 2 weeks.  No sexual activity until you complete antibiotics. Thank you for allowing me to care for you today. Please return to the emergency department if you have new or worsening symptoms. Take your medications as instructed.

## 2020-04-05 NOTE — ED Triage Notes (Signed)
RLQ radiating to her right flank started this past Friday.  Had history of kidney stone. Vaginal discharges started yesterday.

## 2020-04-05 NOTE — ED Notes (Signed)
ED Provider at bedside. 

## 2020-04-05 NOTE — ED Provider Notes (Signed)
[facility] EMERGENCY DEPARTMENT Provider Note   CSN: [id number] Arrival date & time: 04/05/20  0827     History Chief Complaint  Patient presents with  . Abdominal Pain  . Flank pain  . Vaginal discharges    Connie Jenkins is a 39 y.o. female.  Patient is a 39 year old female with past medical history of kidney stones, PID, asthma presenting to the emergency department for right lower quadrant pain dysuria and vaginal discharge.  Patient reports that the pain feels similar to a kidney stone she has had in the past.  Reports that she is having burning with urination and began to have more vaginal discharge.  She denies any fever, chills, nausea, vomiting.  Pain is worse when she walks.  Denies hematuria.  She is sexually active.        Past Medical History:  Diagnosis Date  . Anxiety    no meds  . Asthma    inhaler rarely used - twice year  . Depression    no current Tx.  . Febrile illness, acute 01/2011   Hospitalized for 6 days  . Gestational diabetes   . Headache   . Herpes 2008  . Hx of bronchitis 09/2012  . IBS (irritable bowel syndrome)   . Renal stone 08/26/2014  . Seasonal allergies     Patient Active Problem List   Diagnosis Date Noted  . Encounter for other contraceptive management 07/18/2017  . S/P cesarean section 06/15/2017  . Gestational diabetes mellitus (GDM) affecting pregnancy 04/08/2017  . Migraines 11/13/2016  . 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 CESAREAN SECTION  2000 & 2004   "1st one I dilated 1cm, then the 2nd one was a rpt  . CESAREAN SECTION N/A 06/15/2017   Procedure: CESAREAN SECTION;  Surgeon: Osborne Oman, MD;  Location: [facility];  Service: Obstetrics;  Laterality: N/A;  . CYSTOSCOPY WITH RETROGRADE PYELOGRAM, URETEROSCOPY AND STENT PLACEMENT Left 08/26/2014   Procedure: CYSTOSCOPY WITH RETROGRADE PYELOGRAM, URETEROSCOPY, LASER, STONE EXTRACTION WITH  BASKET;  Surgeon: Raynelle Bring, MD;  Location: [facility];  Service: Urology;  Laterality: Left;  . HERNIA REPAIR    . HYSTEROSCOPY N/A 03/24/2013   Procedure: HYSTEROSCOPY / Dilitation and curettage/endometrial curettings;  Surgeon: Lavonia Drafts, MD;  Location: [facility];  Service: Gynecology;  Laterality: N/A;  Diagnostic hysteroscopy  . TMJ ARTHROPLASTY    . WISDOM TOOTH EXTRACTION       OB History    Gravida  3   Para  3   Term  2   Preterm  1   AB  0   Living  3     SAB  0   TAB      Ectopic      Multiple  0   Live Births  3           Family History  Problem Relation Age of Onset  . Hypertension Mother   . Diabetes Mother     Social History   Tobacco Use  . Smoking status: Former Research scientist (life sciences)  . Smokeless tobacco: Never Used  Substance Use Topics  . Alcohol use: Yes    Alcohol/week: 1.0 standard drinks    Types: 1 Standard drinks or equivalent per week    Comment: occasionally  . Drug use: No    Home Medications Prior to Admission medications   Medication Sig Start Date End Date Taking? Authorizing Provider  clindamycin (CLEOCIN)  150 MG capsule Take 3 capsules (450 mg total) by mouth 3 (three) times daily for 14 days. 04/05/20 04/19/20  Madilyn Hook A, PA-C  doxycycline (VIBRAMYCIN) 100 MG capsule Take 1 capsule (100 mg total) by mouth 2 (two) times daily for 14 days. 04/05/20 04/19/20  Alveria Apley, PA-C    Allergies    Flagyl [metronidazole], Latex, and Penicillins  Review of Systems   Review of Systems  Constitutional: Negative.   HENT: Negative for sore throat.   Respiratory: Negative for cough and shortness of breath.   Cardiovascular: Negative for chest pain.  Gastrointestinal: Positive for abdominal pain. Negative for nausea and vomiting.  Endocrine: Negative for polyuria.  Genitourinary: Positive for dysuria, pelvic pain and vaginal discharge. Negative for decreased urine volume, hematuria and vaginal bleeding.  Musculoskeletal:  Positive for back pain.  Skin: Negative for rash.  Neurological: Negative for dizziness and headaches.  Hematological: Does not bruise/bleed easily.  All other systems reviewed and are negative.   Physical Exam Updated Vital Signs BP 119/77 (BP Location: Right Arm)   Pulse 70   Temp 97.8 F (36.6 C) (Oral)   Resp 18   Ht 5\' 2"  (1.575 m)   Wt 103 kg   LMP 03/31/2020   SpO2 100%   BMI 41.53 kg/m   Physical Exam Vitals and nursing note reviewed. Exam conducted with a chaperone present.  Constitutional:      General: She is not in acute distress.    Appearance: Normal appearance. She is obese. She is not ill-appearing or diaphoretic.  HENT:     Head: Normocephalic.  Eyes:     Conjunctiva/sclera: Conjunctivae normal.  Cardiovascular:     Rate and Rhythm: Normal rate and regular rhythm.  Pulmonary:     Effort: Pulmonary effort is normal.  Abdominal:     General: Abdomen is flat.     Palpations: Abdomen is soft.     Tenderness: There is abdominal tenderness in the right lower quadrant and suprapubic area.  Genitourinary:    Vagina: Vaginal discharge (thick, green) present.     Cervix: Cervical motion tenderness and friability present.     Adnexa: Left adnexa normal.       Right: Tenderness present.   Skin:    General: Skin is warm and dry.  Neurological:     Mental Status: She is alert.  Psychiatric:        Mood and Affect: Mood normal.     ED Results / Procedures / Treatments   Labs (all labs ordered are listed, but only abnormal results are displayed) Labs Reviewed  URINALYSIS, ROUTINE W REFLEX MICROSCOPIC - Abnormal; Notable for the following components:      Result Value   APPearance CLOUDY (*)    Specific Gravity, Urine >1.030 (*)    Hgb urine dipstick SMALL (*)    Leukocytes,Ua MODERATE (*)    All other components within normal limits  COMPREHENSIVE METABOLIC PANEL - Abnormal; Notable for the following components:   Glucose, Bld 111 (*)    Calcium 8.8  (*)    AST 50 (*)    All other components within normal limits  URINALYSIS, MICROSCOPIC (REFLEX) - Abnormal; Notable for the following components:   Bacteria, UA MANY (*)    Trichomonas, UA PRESENT (*)    All other components within normal limits  CBC WITH DIFFERENTIAL/PLATELET  PREGNANCY, URINE    EKG None  Radiology CT Renal Stone Study  Result Date: 04/05/2020 CLINICAL DATA:  Right  lower quadrant, right flank pain EXAM: CT ABDOMEN AND PELVIS WITHOUT CONTRAST TECHNIQUE: Multidetector CT imaging of the abdomen and pelvis was performed following the standard protocol without IV contrast. COMPARISON:  01/06/2019 FINDINGS: Lower chest: Lung bases are clear. No effusions. Heart is normal size. Hepatobiliary: No focal hepatic abnormality. Gallbladder unremarkable. Pancreas: No focal abnormality or ductal dilatation. Spleen: No focal abnormality.  Normal size. Adrenals/Urinary Tract: Punctate nonobstructing stones bilaterally. No ureteral stones or hydronephrosis. Adrenal glands are unremarkable. Urinary bladder decompressed, grossly unremarkable. Stomach/Bowel: Normal appendix. Stomach, large and small bowel grossly unremarkable. Vascular/Lymphatic: No evidence of aneurysm or adenopathy. Reproductive: Uterus and adnexa unremarkable.  No mass. Other: No free fluid or free air. Extensive nodular densities throughout the subcutaneous soft tissues of the buttocks bilaterally, stable since prior study. Musculoskeletal: No acute bony abnormality. IMPRESSION: Punctate bilateral nephrolithiasis. No ureteral stones or hydronephrosis. Normal appendix. No acute findings in the abdomen or pelvis. Electronically Signed   By: Rolm Baptise M.D.   On: 04/05/2020 10:03    Procedures Procedures (including critical care time)  Medications Ordered in ED Medications  cefTRIAXone (ROCEPHIN) 500 mg in dextrose 5 % 50 mL IVPB (500 mg Intravenous New Bag/Given 04/05/20 1017)  doxycycline (VIBRA-TABS) tablet 100 mg (100  mg Oral Given 04/05/20 1010)  clindamycin (CLEOCIN) capsule 450 mg (450 mg Oral Given 04/05/20 1010)  ondansetron (ZOFRAN) injection 4 mg (4 mg Intravenous Given 04/05/20 1011)  cefTRIAXone (ROCEPHIN) 500 MG injection (500 mg  Given 04/05/20 1012)    ED Course  I have reviewed the triage vital signs and the nursing notes.  Pertinent labs & imaging results that were available during my care of the patient were reviewed by me and considered in my medical decision making (see chart for details).  Clinical Course as of Apr 06 1043  Mon Apr 05, 2020  0955 Patient presenting with right flank pain, pelvic pain and vaginal discharge.  Urinalysis significant with an infection with trichomoniasis.  Also has hemoglobin and red blood cells.  With her cervical motion tenderness is going to treat the patient for PID.  She has an allergy to metronidazole so we will treat her with clindamycin in place of that.  She also reports flank pain and does have a history of kidney stones so we will obtain a renal stone CT to be sure there is no obstruction.   [KM]  [id number] CT scan is reassuring.  Patient is afebrile and well-appearing.  Meets criteria for outpatient treatment.  Will treat with 14 days of doxycycline and clindamycin.  Advised on safe sex and follow-up.   [KM]    Clinical Course User Index [KM] Kristine Royal   MDM Rules/Calculators/A&P                      Based on review of vitals, medical screening exam, lab work and/or imaging, there does not appear to be an acute, emergent etiology for the patient's symptoms. Counseled pt on good return precautions and encouraged both PCP and ED follow-up as needed.  Prior to discharge, I also discussed incidental imaging findings with patient in detail and advised appropriate, recommended follow-up in detail.  Clinical Impression: 1. PID (acute pelvic inflammatory disease)     Disposition: Discharge  Prior to providing a prescription for a controlled  substance, I independently reviewed the patient's recent prescription history on the [place]. The patient had no recent or regular prescriptions and was deemed appropriate  for a brief, less than 3 day prescription of narcotic for acute analgesia.  This note was prepared with assistance of Systems analyst. Occasional wrong-word or sound-a-like substitutions may have occurred due to the inherent limitations of voice recognition software.  Final Clinical Impression(s) / ED Diagnoses Final diagnoses:  PID (acute pelvic inflammatory disease)    Rx / DC Orders ED Discharge Orders         Ordered    clindamycin (CLEOCIN) 150 MG capsule  3 times daily     04/05/20 1043    doxycycline (VIBRAMYCIN) 100 MG capsule  2 times daily     04/05/20 1043           Kristine Royal 04/05/20 1044    Little, Wenda Overland, MD 04/06/20 [phone]

## 2020-04-05 NOTE — ED Notes (Signed)
Patient transported to CT, pt ambulated unassisted

## 2020-04-06 LAB — GC/CHLAMYDIA PROBE AMP (~~LOC~~) NOT AT ARMC
Chlamydia: NEGATIVE
Comment: NEGATIVE
Comment: NORMAL
Neisseria Gonorrhea: NEGATIVE

## 2020-05-05 ENCOUNTER — Other Ambulatory Visit: Payer: Self-pay

## 2020-05-05 ENCOUNTER — Encounter (HOSPITAL_BASED_OUTPATIENT_CLINIC_OR_DEPARTMENT_OTHER): Payer: Self-pay | Admitting: Emergency Medicine

## 2020-05-05 ENCOUNTER — Emergency Department (HOSPITAL_BASED_OUTPATIENT_CLINIC_OR_DEPARTMENT_OTHER)
Admission: EM | Admit: 2020-05-05 | Discharge: 2020-05-05 | Disposition: A | Discharge status: Home / Self Care | Payer: Medicaid Other | Attending: Emergency Medicine | Admitting: Emergency Medicine

## 2020-05-05 DIAGNOSIS — Z87891 Personal history of nicotine dependence: Secondary | ICD-10-CM | POA: Insufficient documentation

## 2020-05-05 DIAGNOSIS — B9689 Other specified bacterial agents as the cause of diseases classified elsewhere: Secondary | ICD-10-CM

## 2020-05-05 DIAGNOSIS — N760 Acute vaginitis: Secondary | ICD-10-CM | POA: Insufficient documentation

## 2020-05-05 DIAGNOSIS — J45909 Unspecified asthma, uncomplicated: Secondary | ICD-10-CM | POA: Insufficient documentation

## 2020-05-05 DIAGNOSIS — Z91040 Latex allergy status: Secondary | ICD-10-CM | POA: Insufficient documentation

## 2020-05-05 DIAGNOSIS — A599 Trichomoniasis, unspecified: Secondary | ICD-10-CM | POA: Insufficient documentation

## 2020-05-05 LAB — URINALYSIS, ROUTINE W REFLEX MICROSCOPIC
Bilirubin Urine: NEGATIVE
Glucose, UA: NEGATIVE mg/dL
Ketones, ur: NEGATIVE mg/dL
Nitrite: NEGATIVE
Protein, ur: NEGATIVE mg/dL
Specific Gravity, Urine: 1.02 (ref 1.005–1.030)
pH: 6.5 (ref 5.0–8.0)

## 2020-05-05 LAB — URINALYSIS, MICROSCOPIC (REFLEX)

## 2020-05-05 LAB — WET PREP, GENITAL
Sperm: NONE SEEN
Yeast Wet Prep HPF POC: NONE SEEN

## 2020-05-05 MED ORDER — METRONIDAZOLE 500 MG PO TABS
500.0000 mg | ORAL_TABLET | Freq: Two times a day (BID) | ORAL | 0 refills | Status: DC
Start: 2020-05-05 — End: 2021-07-08

## 2020-05-05 MED ORDER — ONDANSETRON HCL 4 MG PO TABS
4.0000 mg | ORAL_TABLET | Freq: Three times a day (TID) | ORAL | 0 refills | Status: DC | PRN
Start: 2020-05-05 — End: 2021-02-23
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
## 2020-05-05 NOTE — ED Triage Notes (Signed)
Vaginal itching and burning and white discharge almost 2 weeks. Took all of the abx she was prescribed. Took Monistat for a week for these sx, but it did not help.

## 2020-05-05 NOTE — ED Notes (Signed)
ED Provider at bedside. 

## 2020-05-06 LAB — GC/CHLAMYDIA PROBE AMP (~~LOC~~) NOT AT ARMC
Chlamydia: NEGATIVE
Comment: NEGATIVE
Comment: NORMAL
Neisseria Gonorrhea: NEGATIVE

## 2020-06-16 DIAGNOSIS — R9431 Abnormal electrocardiogram [ECG] [EKG]: Secondary | ICD-10-CM | POA: Diagnosis not present

## 2020-06-16 DIAGNOSIS — T192XXA Foreign body in vulva and vagina, initial encounter: Secondary | ICD-10-CM | POA: Diagnosis not present

## 2020-06-16 DIAGNOSIS — Z79899 Other long term (current) drug therapy: Secondary | ICD-10-CM | POA: Diagnosis not present

## 2020-06-16 DIAGNOSIS — Z793 Long term (current) use of hormonal contraceptives: Secondary | ICD-10-CM | POA: Diagnosis not present

## 2020-06-16 DIAGNOSIS — Z881 Allergy status to other antibiotic agents status: Secondary | ICD-10-CM | POA: Diagnosis not present

## 2020-06-16 DIAGNOSIS — Z880 Allergy status to penicillin: Secondary | ICD-10-CM | POA: Diagnosis not present

## 2020-06-16 DIAGNOSIS — K589 Irritable bowel syndrome without diarrhea: Secondary | ICD-10-CM | POA: Diagnosis not present

## 2020-06-16 DIAGNOSIS — R0789 Other chest pain: Secondary | ICD-10-CM | POA: Diagnosis not present

## 2020-06-16 DIAGNOSIS — Z91040 Latex allergy status: Secondary | ICD-10-CM | POA: Diagnosis not present

## 2020-07-05 DIAGNOSIS — Z23 Encounter for immunization: Secondary | ICD-10-CM | POA: Diagnosis not present

## 2020-07-26 DIAGNOSIS — Z23 Encounter for immunization: Secondary | ICD-10-CM | POA: Diagnosis not present

## 2020-08-08 ENCOUNTER — Emergency Department (HOSPITAL_COMMUNITY)
Admission: EM | Admit: 2020-08-08 | Discharge: 2020-08-09 | Disposition: A | Discharge status: Home / Self Care | Payer: Medicaid Other | Attending: Emergency Medicine | Admitting: Emergency Medicine

## 2020-08-08 ENCOUNTER — Encounter (HOSPITAL_COMMUNITY): Payer: Self-pay | Admitting: Emergency Medicine

## 2020-08-08 ENCOUNTER — Other Ambulatory Visit: Payer: Self-pay

## 2020-08-08 DIAGNOSIS — J45909 Unspecified asthma, uncomplicated: Secondary | ICD-10-CM | POA: Insufficient documentation

## 2020-08-08 DIAGNOSIS — T7421XA Adult sexual abuse, confirmed, initial encounter: Secondary | ICD-10-CM | POA: Insufficient documentation

## 2020-08-08 DIAGNOSIS — Z87891 Personal history of nicotine dependence: Secondary | ICD-10-CM | POA: Insufficient documentation

## 2020-08-08 DIAGNOSIS — Z79899 Other long term (current) drug therapy: Secondary | ICD-10-CM | POA: Insufficient documentation

## 2020-08-08 DIAGNOSIS — Z91040 Latex allergy status: Secondary | ICD-10-CM | POA: Insufficient documentation

## 2020-08-08 LAB — CBC WITH DIFFERENTIAL/PLATELET
Abs Immature Granulocytes: 0.02 10*3/uL (ref 0.00–0.07)
Basophils Absolute: 0 10*3/uL (ref 0.0–0.1)
Basophils Relative: 0 %
Eosinophils Absolute: 0.2 10*3/uL (ref 0.0–0.5)
Eosinophils Relative: 2 %
HCT: 43.8 % (ref 36.0–46.0)
Hemoglobin: 13.2 g/dL (ref 12.0–15.0)
Immature Granulocytes: 0 %
Lymphocytes Relative: 31 %
Lymphs Abs: 2.3 10*3/uL (ref 0.7–4.0)
MCH: 26 pg (ref 26.0–34.0)
MCHC: 30.1 g/dL (ref 30.0–36.0)
MCV: 86.2 fL (ref 80.0–100.0)
Monocytes Absolute: 0.5 10*3/uL (ref 0.1–1.0)
Monocytes Relative: 6 %
Neutro Abs: 4.5 10*3/uL (ref 1.7–7.7)
Neutrophils Relative %: 61 %
Platelets: 306 10*3/uL (ref 150–400)
RBC: 5.08 MIL/uL (ref 3.87–5.11)
RDW: 13.5 % (ref 11.5–15.5)
WBC: 7.5 10*3/uL (ref 4.0–10.5)
nRBC: 0 % (ref 0.0–0.2)

## 2020-08-08 LAB — I-STAT BETA HCG BLOOD, ED (MC, WL, AP ONLY): I-stat hCG, quantitative: <5 m[IU]/mL (ref <5)

## 2020-08-08 LAB — COMPREHENSIVE METABOLIC PANEL
ALT: 35 U/L (ref 0–44)
AST: 53 U/L — ABNORMAL HIGH (ref 15–41)
Albumin: 3.8 g/dL (ref 3.5–5.0)
Alkaline Phosphatase: 47 U/L (ref 38–126)
Anion gap: 10 (ref 5–15)
BUN: 10 mg/dL (ref 6–20)
CO2: 26 mmol/L (ref 22–32)
Calcium: 9.3 mg/dL (ref 8.9–10.3)
Chloride: 104 mmol/L (ref 98–111)
Creatinine, Ser: 0.84 mg/dL (ref 0.44–1.00)
GFR calc Af Amer: >60 mL/min (ref >60)
GFR calc non Af Amer: >60 mL/min (ref >60)
Glucose, Bld: 94 mg/dL (ref 70–99)
Potassium: 3.9 mmol/L (ref 3.5–5.1)
Sodium: 140 mmol/L (ref 135–145)
Total Bilirubin: 0.7 mg/dL (ref 0.3–1.2)
Total Protein: 7.3 g/dL (ref 6.5–8.1)

## 2020-08-08 LAB — RAPID HIV SCREEN (HIV 1/2 AB+AG)
HIV 1/2 Antibodies: NONREACTIVE
HIV-1 P24 Antigen - HIV24: NONREACTIVE
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
## 2020-08-08 NOTE — Discharge Instructions (Addendum)
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 professional as soon as possible:  allergic reactions like skin rash, itching or hives, swelling of the face, lips, or  tongue  breathing problems  fever, chills  irregular heartbeat  pain when passing urine  redness, blistering, peeling, or loosening of the skin, including inside the mouth  seizures  stomach pain, cramps  unusual bleeding, bruising  unusually weak or tired Side effects that usually do not require medical attention (report to your doctor or health care professional if they continue or are bothersome):  diarrhea  dizzy, drowsy  headache  nausea, vomiting  pain, swelling, irritation where injected  stomach upset  sweating This list may not  describe all possible side effects. Call your doctor for medical advice about side effects. You may report side effects to FDA at 1-800-FDA-1088. Where should I keep my medicine? Keep out of the reach of children and pets. You will be instructed on how to store this drug. Protect from light. Throw away any unused drug after the expiration date. NOTE: This sheet is a summary. It may not cover all possible information. If you have questions about this medicine, talk to your doctor, pharmacist, or health care provider.  2020 Elsevier/Gold Standard (2019-06-19 18:29:21)    Azithromycin tablets  What is this medicine? AZITHROMYCIN (az ith roe MYE sin) is a macrolide antibiotic. It is used to treat or prevent certain kinds of bacterial infections. It will not work for colds, flu, or other viral infections. This medicine may be used for other purposes; ask your health care provider or pharmacist if you have questions. COMMON BRAND NAME(S): Zithromax, Zithromax Tri-Pak, Zithromax Z-Pak What should I tell my health care provider before I take this medicine? They need to know if you have any of these conditions:  history of blood diseases, like leukemia  history of irregular heartbeat  kidney disease  liver disease  myasthenia gravis  an unusual or allergic reaction to azithromycin, erythromycin, other macrolide antibiotics, foods, dyes, or preservatives  pregnant or trying to get pregnant  breast-feeding How should I use this medicine? Take this medicine by mouth with a full glass of water. Follow the directions on the prescription label. The tablets can be taken with food or on an empty stomach. If the medicine upsets your stomach, take it with food. Take your medicine at regular intervals. Do not take your medicine more often than directed. Take all of your medicine as directed even if you think your are better. Do not skip doses or stop your medicine early. Talk to your pediatrician  regarding the use of this medicine in children. While this drug may be prescribed for children as young as 6 months for selected conditions, precautions do apply. Overdosage: If you think you have taken too much of this medicine contact a poison control center or emergency room at once. NOTE: This medicine is only for you. Do not share this medicine with others. What if I miss a dose? If you miss a dose, take it as soon as you can. If it is almost time for your next dose, take only that dose. Do not take double or extra doses. What may interact with this medicine? Do not take this medicine with any of the following medications:  cisapride  dronedarone  pimozide  thioridazine This medicine may also interact with the following medications:  antacids that contain aluminum or magnesium  birth control pills  colchicine  cyclosporine  digoxin  ergot alkaloids like dihydroergotamine, ergotamine  nelfinavir  other medicines that prolong the QT interval (an abnormal heart rhythm)  phenytoin  warfarin This list may not describe all possible interactions.  Give your health care provider a list of all the medicines, herbs, non-prescription drugs, or dietary supplements you use. Also tell them if you smoke, drink alcohol, or use illegal drugs. Some items may interact with your medicine. What should I watch for while using this medicine? Tell your doctor or healthcare provider if your symptoms do not start to get better or if they get worse. This medicine may cause serious skin reactions. They can happen weeks to months after starting the medicine. Contact your healthcare provider right away if you notice fevers or flu-like symptoms with a rash. The rash may be red or purple and then turn into blisters or peeling of the skin. Or, you might notice a red rash with swelling of the face, lips or lymph nodes in your neck or under your arms. Do not treat diarrhea with over the counter products.  Contact your doctor if you have diarrhea that lasts more than 2 days or if it is severe and watery. This medicine can make you more sensitive to the sun. Keep out of the sun. If you cannot avoid being in the sun, wear protective clothing and use sunscreen. Do not use sun lamps or tanning beds/booths. What side effects may I notice from receiving this medicine? Side effects that you should report to your doctor or health care professional as soon as possible:  allergic reactions like skin rash, itching or hives, swelling of the face, lips, or tongue  bloody or watery diarrhea  breathing problems  chest pain  fast, irregular heartbeat  muscle weakness  rash, fever, and swollen lymph nodes  redness, blistering, peeling, or loosening of the skin, including inside the mouth  signs and symptoms of liver injury like dark yellow or brown urine; general ill feeling or flu-like symptoms; light-colored stools; loss of appetite; nausea; right upper belly pain; unusually weak or tired; yellowing of the eyes or skin  white patches or sores in the mouth  unusually weak or tired Side effects that usually do not require medical attention (report to your doctor or health care professional if they continue or are bothersome):  diarrhea  nausea  stomach pain  vomiting This list may not describe all possible side effects. Call your doctor for medical advice about side effects. You may report side effects to FDA at 1-800-FDA-1088. Where should I keep my medicine? Keep out of the reach of children. Store at room temperature between 15 and 30 degrees C (59 and 86 degrees F). Throw away any unused medicine after the expiration date. NOTE: This sheet is a summary. It may not cover all possible information. If you have questions about this medicine, talk to your doctor, pharmacist, or health care provider.  2020 Elsevier/Gold Standard (2019-02-20 17:19:20)     Ulipristal oral tablets What is this  medicine? ULIPRISTAL (UE li pris tal) is an emergency contraceptive. It prevents pregnancy if taken within 5 days (120 hours) after your regular birth control fails or you have unprotected sex. This medicine will not work if you are already pregnant. This medicine may be used for other purposes; ask your health care provider or pharmacist if you have questions. COMMON BRAND NAME(S): ella What should I tell my health care provider before I take this medicine? They need to know if you have any of these conditions:  liver disease  an unusual or allergic reaction to ulipristal, other medicines, foods, dyes, or preservatives  pregnant or trying to get pregnant  breast-feeding How should I use  this medicine? Take this medicine by mouth with or without food. Your doctor may want you to use a quick-response pregnancy test prior to using the tablets. Take your medicine as soon as possible and not more than 5 days (120 hours) after the event. This medicine can be taken at any time during your menstrual cycle. Follow the dose instructions of your health care provider exactly. Contact your health care provider right away if you vomit within 3 hours of taking your medicine to discuss if you need to take another tablet. A patient package insert for the product will be given with each prescription and refill. Read this sheet carefully each time. The sheet may change frequently. Contact your pediatrician regarding the use of this medicine in children. Special care may be needed. Overdosage: If you think you have taken too much of this medicine contact a poison control center or emergency room at once. NOTE: This medicine is only for you. Do not share this medicine with others. What if I miss a dose? This medicine is not for regular use. If you vomit within 3 hours of taking your dose, contact your health care professional for instructions. What may interact with this medicine? This medicine may interact with  the following medications:  barbiturates such as phenobarbital or primidone  birth control pills  bosentan  carbamazepine  certain medicines for fungal infections like griseofulvin, itraconazole, and ketoconazole  certain medicines for HIV or AIDS or hepatitis  dabigatran  digoxin  felbamate  fexofenadine  oxcarbazepine  phenytoin  rifampin  St. John's Wort  topiramate This list may not describe all possible interactions. Give your health care provider a list of all the medicines, herbs, non-prescription drugs, or dietary supplements you use. Also tell them if you smoke, drink alcohol, or use illegal drugs. Some items may interact with your medicine. What should I watch for while using this medicine? Your period may begin a few days earlier or later than expected. If your period is more than 7 days late, pregnancy is possible. See your health care provider as soon as you can and get a pregnancy test. Talk to your healthcare provider before taking this medicine if you know or suspect that you are pregnant. Contact your healthcare provider if you think you may be pregnant and you have taken this medicine. If you have severe abdominal pain about 3 to 5 weeks after taking this medicine, you may have a pregnancy outside the womb, which is called an ectopic or tubal pregnancy. Call your health care provider or go to the nearest emergency room right away if you think this is happening. Discuss birth control options with your health care provider. Emergency birth control is not to be used routinely to prevent pregnancy. It should not be used more than once in the same cycle. Birth control pills may not work properly while you are taking this medicine. Wait at least 5 days after taking this medicine to start or continue other hormone based birth control. Be sure to use a reliable barrier contraceptive method (such as a condom with spermicide) between the time you take this medicine and  your next period. This medicine does not protect you against HIV infection (AIDS) or any other sexually transmitted diseases (STDs). What side effects may I notice from receiving this medicine? Side effects that you should report to your doctor or health care professional as soon as possible:  allergic reactions like skin rash, itching or hives, swelling of the face, lips,  or tongue Side effects that usually do not require medical attention (report to your doctor or health care professional if they continue or are bothersome):  abdominal pain or cramping  dizziness  headache  nausea  spotting  tiredness This list may not describe all possible side effects. Call your doctor for medical advice about side effects. You may report side effects to FDA at 1-800-FDA-1088. Where should I keep my medicine? Keep out of the reach of children. Store at between 20 and 25 degrees C (68 and 77 degrees F). Protect from light and keep in the blister card inside the original box until you are ready to take it. Throw away any unused medicine after the expiration date. NOTE: This sheet is a summary. It may not cover all possible information. If you have questions about this medicine, talk to your doctor, pharmacist, or health care provider.  2020 Elsevier/Gold Standard (2017-03-30 14:27:59)

## 2020-08-08 NOTE — ED Notes (Signed)
[employer] at Bedside.
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
## 2020-08-09 ENCOUNTER — Ambulatory Visit (HOSPITAL_COMMUNITY)
Admission: EM | Admit: 2020-08-09 | Discharge: 2020-08-09 | Disposition: A | Discharge status: Home / Self Care | Payer: No Typology Code available for payment source | Source: Ambulatory Visit | Attending: Emergency Medicine | Admitting: Emergency Medicine

## 2020-08-09 DIAGNOSIS — Z0441 Encounter for examination and observation following alleged adult rape: Secondary | ICD-10-CM | POA: Insufficient documentation

## 2020-08-09 LAB — RPR: RPR Ser Ql: NONREACTIVE

## 2020-08-09 MED ORDER — AZITHROMYCIN 250 MG PO TABS
1000.0000 mg | ORAL_TABLET | Freq: Once | ORAL | Status: AC
  Administered 2020-08-09: 1000 mg via ORAL

## 2020-08-09 MED ORDER — LIDOCAINE HCL (PF) 1 % IJ SOLN
1.0000 mL | Freq: Once | INTRAMUSCULAR | Status: AC
  Administered 2020-08-09: 1 mL

## 2020-08-09 MED ORDER — METRONIDAZOLE 500 MG PO TABS
2000.0000 mg | ORAL_TABLET | Freq: Once | ORAL | Status: AC
  Administered 2020-08-09: 2000 mg via ORAL

## 2020-08-09 MED ORDER — ULIPRISTAL ACETATE 30 MG PO TABS
30.0000 mg | ORAL_TABLET | Freq: Once | ORAL | Status: AC
  Administered 2020-08-09: 30 mg via ORAL

## 2020-08-09 MED ORDER — ELVITEG-COBIC-EMTRICIT-TENOFAF 150-150-200-10 MG PREPACK
5.0000 | ORAL_TABLET | Freq: Once | ORAL | Status: AC
  Administered 2020-08-09: 1 via ORAL

## 2020-08-09 MED ORDER — CEFTRIAXONE SODIUM 500 MG IJ SOLR
500.0000 mg | Freq: Once | INTRAMUSCULAR | Status: AC
  Administered 2020-08-09: 500 mg via INTRAMUSCULAR

## 2020-08-09 MED ORDER — ELVITEG-COBIC-EMTRICIT-TENOFAF 150-150-200-10 MG PO TABS: 1.0000 | ORAL_TABLET | Freq: Every day | ORAL | 0 refills | Status: DC

## 2020-08-09 MED FILL — GENVOYA TABLET: 150-150-200 | 30 days supply | Qty: 30 | Fill #0
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
## 2020-08-12 NOTE — SANE Note (Signed)
Patient is concerned about getting mail at her apartment. She has a PO [address], [address] [place], [place] [address]  She also has a grandmother who allows mail to be delivered to her home at  [place], [place] [address]  The patient is also willing to pick up the remainder of her Genvoya prescription at the [facility].   The patient's best options will be discussed with the pharmacy when they open.
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
## 2020-08-12 NOTE — SANE Note (Signed)
Follow-up Phone Call  Patient gives verbal consent for a FNE/SANE follow-up phone call in 48-72 hours: No Patient's telephone number: [phone] Patient gives verbal consent to leave voicemail at the phone number listed above: No DO NOT CALL between the hours of: No follow up needed, notes all questions have been answered.

## 2020-08-16 IMAGING — CT CT RENAL STONE PROTOCOL
2 of 4 series · 16 of 46 positions shown, 18 images · non-contrast
Comparison: Prior CT from 08/24/2014.

CLINICAL DATA: Initial evaluation for acute back pain with
urination.

EXAM:
CT ABDOMEN AND PELVIS WITHOUT CONTRAST
TECHNIQUE: Multidetector CT imaging of the abdomen and pelvis was performed
following the standard protocol without IV contrast.

[Series 3: renal stone 5.0 · axial · 0.98mm/px · z∈[-369,+41]mm · 13 of 90 slices shown, 15 images]
[im 4/90  soft-tissue]
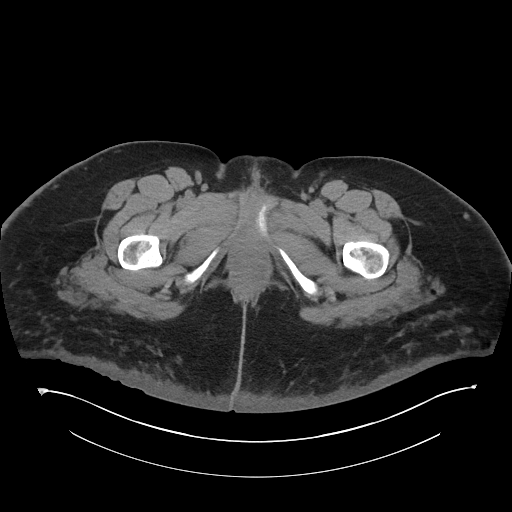
[im 4/90  bone]
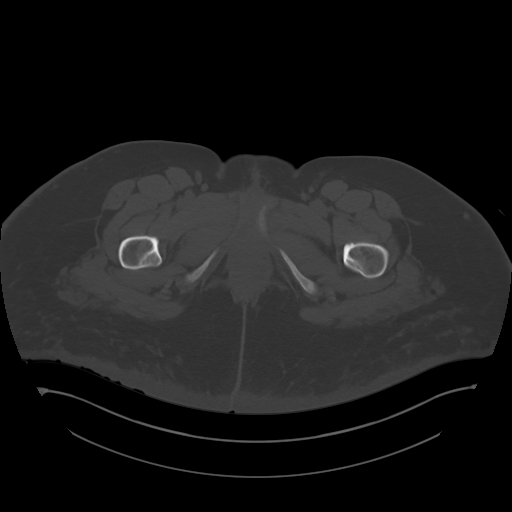
[im 11/90  soft-tissue]
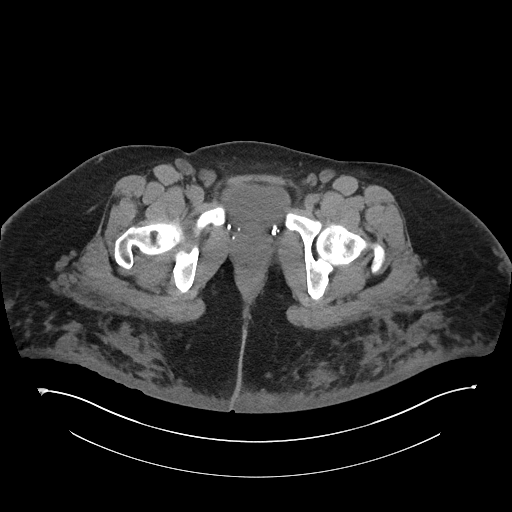
[im 18/90  soft-tissue]
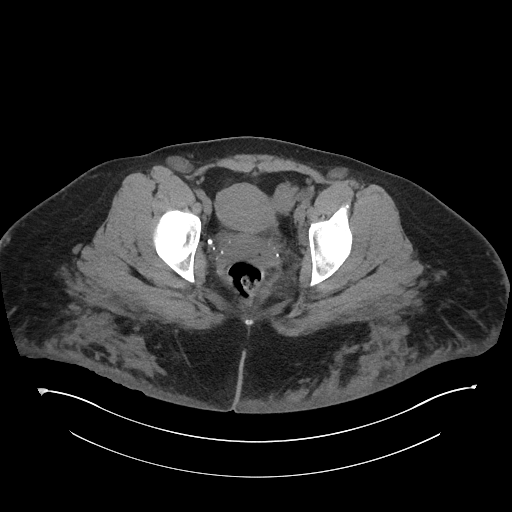
[im 25/90  soft-tissue]
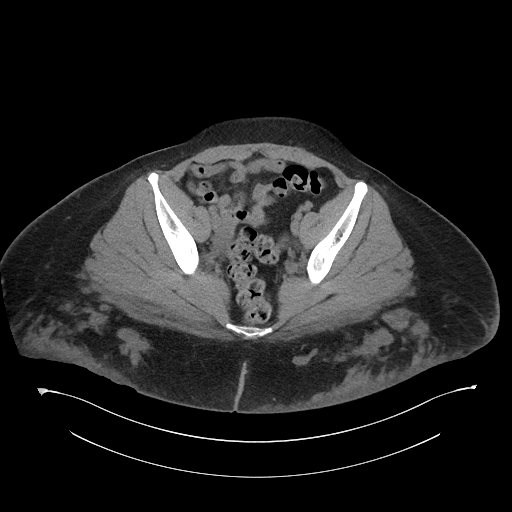
[im 33/90  soft-tissue]
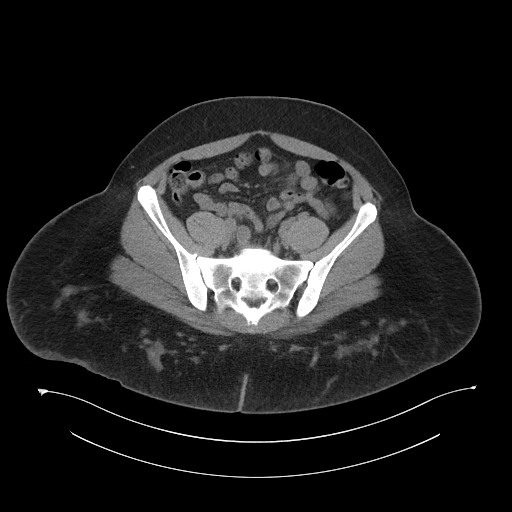
[im 40/90  soft-tissue]
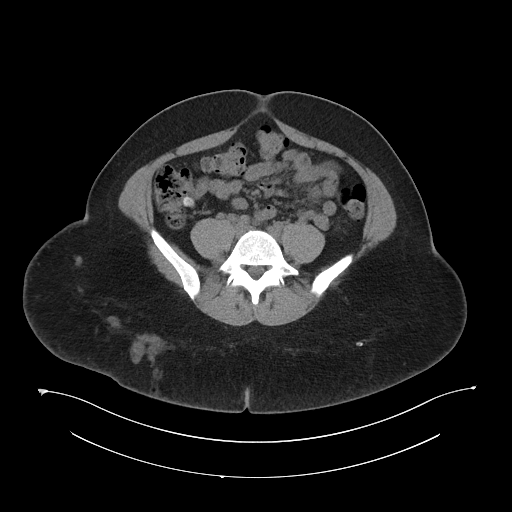
[im 47/90  soft-tissue]
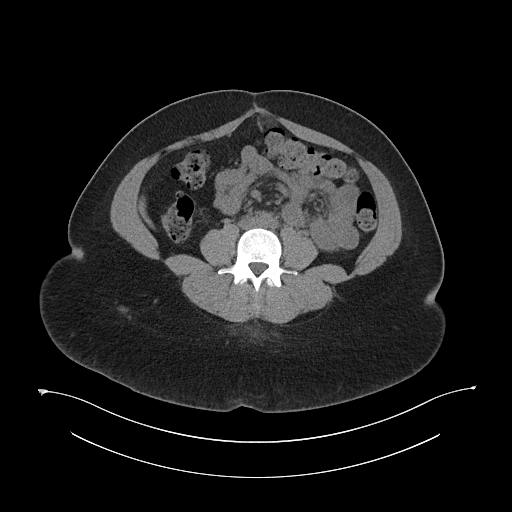
[im 50/90  soft-tissue]
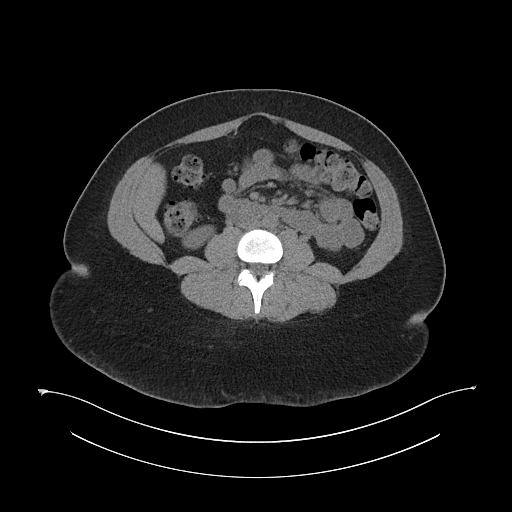
[im 57/90  soft-tissue]
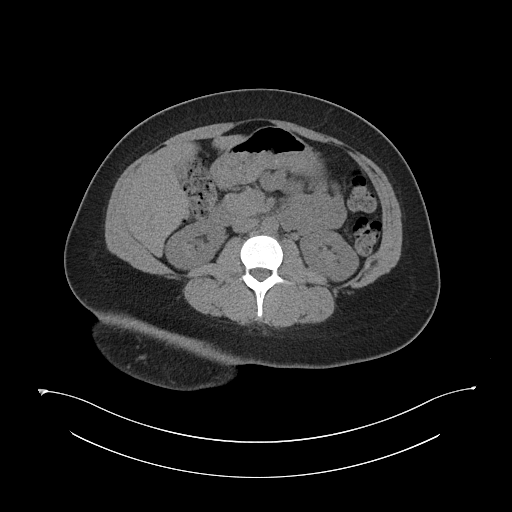
[im 57/90  bone]
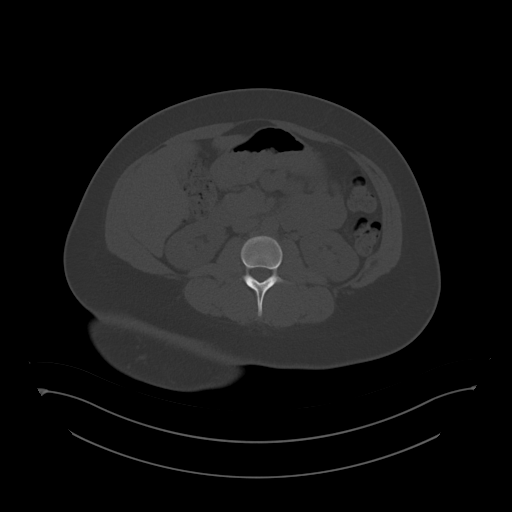
[im 65/90  soft-tissue]
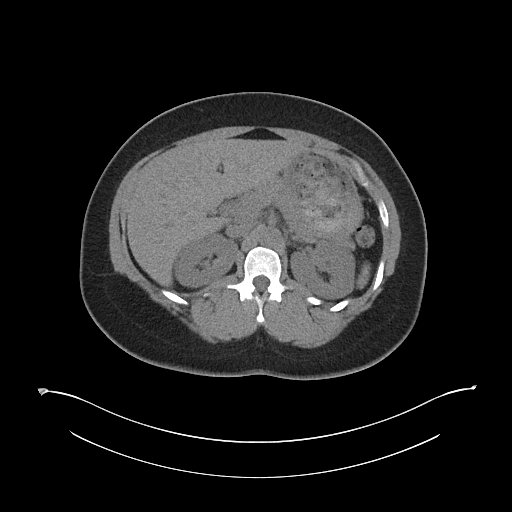
[im 72/90  soft-tissue]
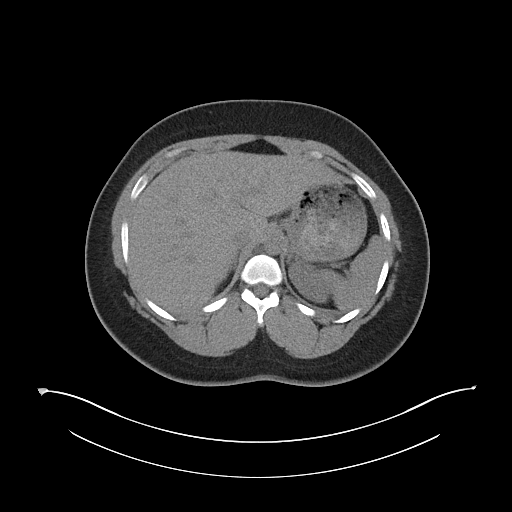
[im 79/90  soft-tissue]
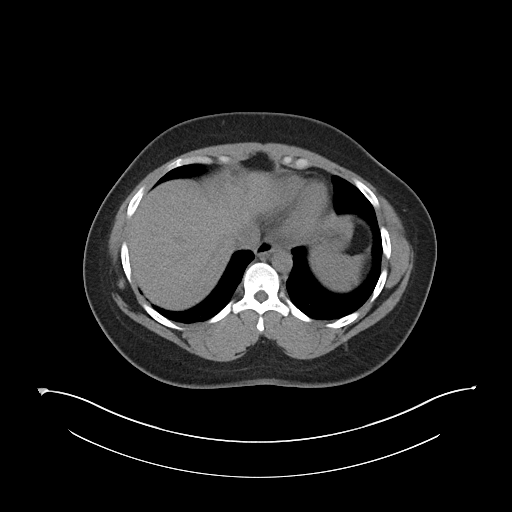
[im 86/90  soft-tissue]
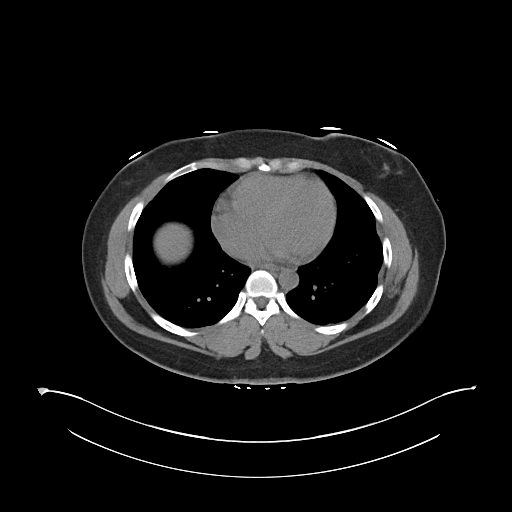

[Series 5: renal stone 3.0 cor · coronal · 0.87mm/px · 3 of 107 slices shown]
[im 36/107  soft-tissue]
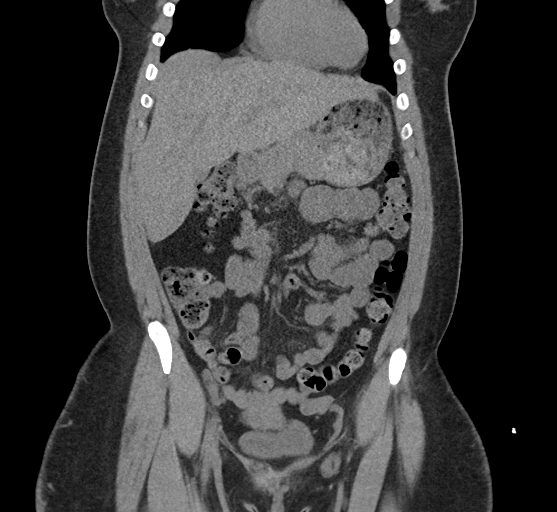
[im 48/107  soft-tissue]
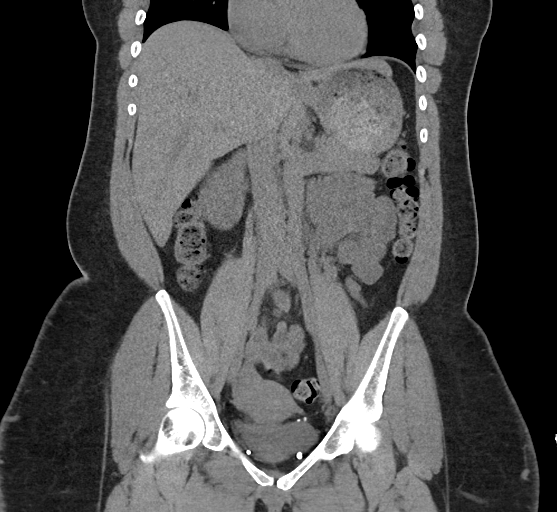
[im 59/107  soft-tissue]
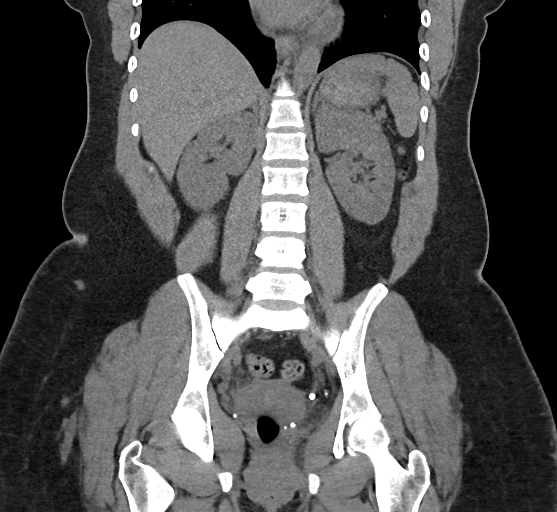

[16 of 46 positions shown; findings below may reference images not displayed]

FINDINGS: Lower chest: Mild subsegmental atelectatic changes seen dependently
within the visualized lung bases. Visualized lungs are otherwise
clear.

Hepatobiliary: Limited noncontrast evaluation liver is unremarkable.
Gallbladder within normal limits. No biliary dilatation.

Pancreas: Pancreas within normal limits.

Spleen: Spleen within normal limits.

Adrenals/Urinary Tract: Adrenal glands are normal. Punctate 2 mm
nonobstructive stone present within the lower pole left kidney. No
hydronephrosis or significant hydroureter. No definite radiopaque
calculi seen along the course of either renal collecting system.
Multiple vascular phleboliths noted within the pelvis.

Stomach/Bowel: Stomach within normal limits. No evidence for bowel
obstruction. Normal appendix. Few colonic diverticula noted within
the ascending colon. No acute inflammatory changes seen about the
bowels.

Vascular/Lymphatic: Intra-abdominal aorta of normal caliber. Minimal
aortic atherosclerosis.

Mildly prominent inguinal lymph nodes measure up to 16 mm on the
left and 15 mm on the right, indeterminate, but could be reactive.
No other adenopathy within the abdomen and pelvis.

Reproductive: Uterus and ovaries within normal limits.

Other: No free air or fluid. Sequelae of prior hernia repair noted
at the paraumbilical region.

Musculoskeletal: Prominent skin thickening with underlying
multifocal soft tissue density within the subcutaneous fat of the
bilateral gluteal regions, nonspecific. Finding is mildly progressed
relative to 3755. No acute osseous finding. No discrete lytic or
blastic osseous lesions.
IMPRESSION: 1. 2 mm nonobstructive left renal nephrolithiasis. No definite
ureterolithiasis or evidence for obstructive uropathy.
2. Colonic diverticulosis without evidence for acute diverticulitis.
3. Mildly enlarged bilateral inguinal lymph nodes, nonspecific, but
could be reactive in nature.
4. Diffuse skin thickening with underlying nodular soft tissue
density within the subcutaneous fat of the bilateral flanks,
nonspecific, and mildly progressed relative to 3755. Correlation
with history and physical exam suggested.

## 2020-08-24 DIAGNOSIS — Z3009 Encounter for other general counseling and advice on contraception: Secondary | ICD-10-CM | POA: Diagnosis not present

## 2020-08-24 DIAGNOSIS — Z0389 Encounter for observation for other suspected diseases and conditions ruled out: Secondary | ICD-10-CM | POA: Diagnosis not present

## 2020-08-24 DIAGNOSIS — Z1388 Encounter for screening for disorder due to exposure to contaminants: Secondary | ICD-10-CM | POA: Diagnosis not present

## 2020-08-30 DIAGNOSIS — F430 Acute stress reaction: Secondary | ICD-10-CM | POA: Diagnosis not present

## 2020-09-06 DIAGNOSIS — F430 Acute stress reaction: Secondary | ICD-10-CM | POA: Diagnosis not present

## 2021-01-24 ENCOUNTER — Other Ambulatory Visit (HOSPITAL_COMMUNITY)
Admission: RE | Admit: 2021-01-24 | Discharge: 2021-01-24 | Disposition: A | Discharge status: Home / Self Care | Payer: Medicaid Other | Source: Ambulatory Visit | Attending: Family Medicine | Admitting: Family Medicine

## 2021-01-24 ENCOUNTER — Ambulatory Visit (INDEPENDENT_AMBULATORY_CARE_PROVIDER_SITE_OTHER): Payer: Medicaid Other | Admitting: Family Medicine

## 2021-01-24 ENCOUNTER — Other Ambulatory Visit: Payer: Self-pay

## 2021-01-24 VITALS — BP 130/75 | HR 76 | Ht 63.0 in | Wt 225.8 lb

## 2021-01-24 DIAGNOSIS — N760 Acute vaginitis: Secondary | ICD-10-CM

## 2021-01-24 DIAGNOSIS — R300 Dysuria: Secondary | ICD-10-CM | POA: Diagnosis not present

## 2021-01-24 DIAGNOSIS — B009 Herpesviral infection, unspecified: Secondary | ICD-10-CM

## 2021-01-24 DIAGNOSIS — O24419 Gestational diabetes mellitus in pregnancy, unspecified control: Secondary | ICD-10-CM | POA: Diagnosis not present

## 2021-01-24 DIAGNOSIS — N898 Other specified noninflammatory disorders of vagina: Secondary | ICD-10-CM | POA: Insufficient documentation

## 2021-01-24 DIAGNOSIS — Z124 Encounter for screening for malignant neoplasm of cervix: Secondary | ICD-10-CM | POA: Diagnosis not present

## 2021-01-24 LAB — POCT URINALYSIS DIP (MANUAL ENTRY)
Bilirubin, UA: NEGATIVE
Glucose, UA: NEGATIVE mg/dL
Ketones, POC UA: NEGATIVE mg/dL
Leukocytes, UA: NEGATIVE
Nitrite, UA: NEGATIVE
Protein Ur, POC: NEGATIVE mg/dL
Spec Grav, UA: >=1.03 — AB (ref 1.010–1.025)
Urobilinogen, UA: 0.2 E.U./dL
pH, UA: 5.5 (ref 5.0–8.0)

## 2021-01-24 LAB — POCT WET PREP (WET MOUNT)
Clue Cells Wet Prep Whiff POC: NEGATIVE
Trichomonas Wet Prep HPF POC: ABSENT

## 2021-01-24 LAB — POCT UA - MICROSCOPIC ONLY

## 2021-01-24 LAB — POCT GLYCOSYLATED HEMOGLOBIN (HGB A1C): Hemoglobin A1C: 5.2 % (ref 4.0–5.6)

## 2021-01-24 MED ORDER — VALACYCLOVIR HCL 1 G PO TABS: 500.0000 mg | ORAL_TABLET | Freq: Every day | ORAL | 2 refills | Status: DC
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
## 2021-01-25 LAB — COMPREHENSIVE METABOLIC PANEL
ALT: 24 IU/L (ref 0–32)
AST: 45 IU/L — ABNORMAL HIGH (ref 0–40)
Albumin/Globulin Ratio: 1.4 (ref 1.2–2.2)
Albumin: 4.1 g/dL (ref 3.8–4.8)
Alkaline Phosphatase: 61 IU/L (ref 44–121)
BUN/Creatinine Ratio: 16 (ref 9–23)
BUN: 13 mg/dL (ref 6–20)
Bilirubin Total: 0.3 mg/dL (ref 0.0–1.2)
CO2: 23 mmol/L (ref 20–29)
Calcium: 8.5 mg/dL — ABNORMAL LOW (ref 8.7–10.2)
Chloride: 102 mmol/L (ref 96–106)
Creatinine, Ser: 0.81 mg/dL (ref 0.57–1.00)
Globulin, Total: 3 g/dL (ref 1.5–4.5)
Glucose: 86 mg/dL (ref 65–99)
Potassium: 4.1 mmol/L (ref 3.5–5.2)
Sodium: 138 mmol/L (ref 134–144)
Total Protein: 7.1 g/dL (ref 6.0–8.5)
eGFR: 96 mL/min/{1.73_m2} (ref >59)

## 2021-01-25 LAB — LIPID PANEL
Chol/HDL Ratio: 4.7 ratio — ABNORMAL HIGH (ref 0.0–4.4)
Cholesterol, Total: 193 mg/dL (ref 100–199)
HDL: 41 mg/dL (ref >39)
LDL Chol Calc (NIH): 136 mg/dL — ABNORMAL HIGH (ref 0–99)
Triglycerides: 87 mg/dL (ref 0–149)
VLDL Cholesterol Cal: 16 mg/dL (ref 5–40)

## 2021-01-25 LAB — HIV ANTIBODY (ROUTINE TESTING W REFLEX): HIV Screen 4th Generation wRfx: NONREACTIVE

## 2021-01-25 LAB — RPR: RPR Ser Ql: NONREACTIVE

## 2021-01-25 LAB — HEPATITIS C ANTIBODY: Hep C Virus Ab: <0.1 s/co ratio (ref 0.0–0.9)

## 2021-01-26 DIAGNOSIS — Z124 Encounter for screening for malignant neoplasm of cervix: Secondary | ICD-10-CM | POA: Insufficient documentation

## 2021-01-26 DIAGNOSIS — N760 Acute vaginitis: Secondary | ICD-10-CM | POA: Insufficient documentation

## 2021-01-26 DIAGNOSIS — B9689 Other specified bacterial agents as the cause of diseases classified elsewhere: Secondary | ICD-10-CM | POA: Insufficient documentation

## 2021-01-26 LAB — CYTOLOGY - PAP
Adequacy: ABSENT
Chlamydia: NEGATIVE
Comment: NEGATIVE
Comment: NEGATIVE
Comment: NEGATIVE
Comment: NORMAL
Diagnosis: NEGATIVE
High risk HPV: NEGATIVE
Neisseria Gonorrhea: NEGATIVE
Trichomonas: NEGATIVE
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
## 2021-01-26 NOTE — Assessment & Plan Note (Signed)
Patient states she is out of her Valtrex prescription.  States she takes 500 mg daily.  Not complaining of active outbreak.  No lesions appreciated on exam.  Sent a new prescription.
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
## 2021-02-01 DIAGNOSIS — F4310 Post-traumatic stress disorder, unspecified: Secondary | ICD-10-CM | POA: Diagnosis not present

## 2021-02-07 ENCOUNTER — Ambulatory Visit: Payer: Medicaid Other | Admitting: Family Medicine

## 2021-02-07 ENCOUNTER — Other Ambulatory Visit: Payer: Self-pay

## 2021-02-07 ENCOUNTER — Encounter: Payer: Self-pay | Admitting: Family Medicine

## 2021-02-07 VITALS — BP 114/76 | HR 78 | Ht 63.0 in | Wt 229.0 lb

## 2021-02-07 DIAGNOSIS — B009 Herpesviral infection, unspecified: Secondary | ICD-10-CM | POA: Diagnosis not present

## 2021-02-07 DIAGNOSIS — R4589 Other symptoms and signs involving emotional state: Secondary | ICD-10-CM

## 2021-02-07 DIAGNOSIS — R300 Dysuria: Secondary | ICD-10-CM

## 2021-02-07 DIAGNOSIS — G5600 Carpal tunnel syndrome, unspecified upper limb: Secondary | ICD-10-CM | POA: Diagnosis not present

## 2021-02-07 DIAGNOSIS — R1032 Left lower quadrant pain: Secondary | ICD-10-CM | POA: Diagnosis not present

## 2021-02-07 DIAGNOSIS — R7401 Elevation of levels of liver transaminase levels: Secondary | ICD-10-CM

## 2021-02-07 DIAGNOSIS — F4310 Post-traumatic stress disorder, unspecified: Secondary | ICD-10-CM | POA: Diagnosis not present

## 2021-02-07 DIAGNOSIS — Z308 Encounter for other contraceptive management: Secondary | ICD-10-CM | POA: Diagnosis not present

## 2021-02-07 LAB — POCT URINALYSIS DIP (MANUAL ENTRY)
Bilirubin, UA: NEGATIVE
Glucose, UA: NEGATIVE mg/dL
Ketones, POC UA: NEGATIVE mg/dL
Leukocytes, UA: NEGATIVE
Nitrite, UA: NEGATIVE
Protein Ur, POC: NEGATIVE mg/dL
Spec Grav, UA: 1.025 (ref 1.010–1.025)
Urobilinogen, UA: 0.2 E.U./dL
pH, UA: 7 (ref 5.0–8.0)

## 2021-02-07 MED ORDER — TAMSULOSIN HCL 0.4 MG PO CAPS: 0.4000 mg | ORAL_CAPSULE | Freq: Every day | ORAL | 3 refills | Status: DC

## 2021-02-07 NOTE — Patient Instructions (Addendum)
It was nice to see you again today,  I will send in a new Valtrex or equivalent prescription.  I will get the records from the therapist and we can discuss starting a new medication at your next visit if appropriate.  I have ordered some more blood testing for you based on your elevated AST.  I would call you with results.   I will get a CT renal stone study for you.  I have also prescribed a medication called Flomax for you to take once a day this will help expel the stone.  Have a great day,  [name], MD

## 2021-02-07 NOTE — Progress Notes (Signed)
ROI completed to [facility]. Original placed in to be scanned pile and copy placed in to be faxed pile. Christen Bame, CMA
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
## 2021-02-08 LAB — HEPATITIS PANEL, ACUTE
Hep A IgM: NEGATIVE
Hep B C IgM: NEGATIVE
Hep C Virus Ab: <0.1 s/co ratio (ref 0.0–0.9)
Hepatitis B Surface Ag: NEGATIVE

## 2021-02-08 MED ORDER — VALACYCLOVIR HCL 500 MG PO TABS: 500.0000 mg | ORAL_TABLET | Freq: Every day | ORAL | 1 refills | Status: DC

## 2021-02-09 DIAGNOSIS — R4589 Other symptoms and signs involving emotional state: Secondary | ICD-10-CM | POA: Insufficient documentation

## 2021-02-09 DIAGNOSIS — R300 Dysuria: Secondary | ICD-10-CM | POA: Insufficient documentation

## 2021-02-09 DIAGNOSIS — G5600 Carpal tunnel syndrome, unspecified upper limb: Secondary | ICD-10-CM | POA: Insufficient documentation

## 2021-02-09 HISTORY — DX: Dysuria: R30.0

## 2021-02-09 NOTE — Assessment & Plan Note (Signed)
Again no signs of infection on dipstick.  Likely due to stones.  Will get CT given her history of large stones requiring surgical removal. Also prescribed flomax.

## 2021-02-09 NOTE — Assessment & Plan Note (Signed)
Valtrex should be covered by medicaid according to their preferred drug list.  Unsure why patient was told it wasn't covered.  Re sent it as 500mg  tabs instead of 1/2 tab of 1000mg .
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
## 2021-02-09 NOTE — Assessment & Plan Note (Signed)
Chronic issue.  Pt currently using night splints but symptoms worsening.  Will send in referral to sports med for possible steroid injection.

## 2021-02-09 NOTE — Assessment & Plan Note (Signed)
Pt inquiring about birth control methods that will either stop her periods from occurring or lessen her symptoms associated with her menstrual cycle including worsening mood and increaesd appetite.  Does not want to go back on depo shot at this time. Not a good candidate for combined ocp's given her migraines. Discussed variable affects on menstrual cycles with [facility].  Pt will think about her options.

## 2021-02-16 ENCOUNTER — Ambulatory Visit: Payer: Medicaid Other | Admitting: Family Medicine

## 2021-02-16 DIAGNOSIS — F4310 Post-traumatic stress disorder, unspecified: Secondary | ICD-10-CM | POA: Diagnosis not present

## 2021-02-18 ENCOUNTER — Other Ambulatory Visit: Payer: Medicaid Other

## 2021-02-23 ENCOUNTER — Encounter: Payer: Self-pay | Admitting: Family Medicine

## 2021-02-23 ENCOUNTER — Ambulatory Visit (INDEPENDENT_AMBULATORY_CARE_PROVIDER_SITE_OTHER): Payer: Medicaid Other | Admitting: Family Medicine

## 2021-02-23 ENCOUNTER — Other Ambulatory Visit: Payer: Self-pay

## 2021-02-23 VITALS — BP 112/70 | HR 79 | Ht 63.0 in | Wt 230.2 lb

## 2021-02-23 DIAGNOSIS — R4589 Other symptoms and signs involving emotional state: Secondary | ICD-10-CM

## 2021-02-23 DIAGNOSIS — F4310 Post-traumatic stress disorder, unspecified: Secondary | ICD-10-CM

## 2021-02-23 MED ORDER — SERTRALINE HCL 25 MG PO TABS: 25.0000 mg | ORAL_TABLET | Freq: Every day | ORAL | 0 refills | Status: DC

## 2021-02-23 NOTE — Patient Instructions (Addendum)
It was nice to see you today,  I have sent in a prescription for sertraline.  This is an antidepressant medication that is also indicated for PTSD.  I would like you to follow-up in 2 to 3 weeks with me and we can talk about adjusting the medicine as needed.  Have a great day,  [name], MD  Sertraline Tablets What is this medicine? SERTRALINE (SER tra leen) is used to treat depression. It may also be used to treat obsessive compulsive disorder, panic disorder, post-trauma stress disorder, premenstrual dysphoric disorder (PMDD) or social anxiety. This medicine may be used for other purposes; ask your health care provider or pharmacist if you have questions. COMMON BRAND NAME(S): Zoloft What should I tell my health care provider before I take this medicine? They need to know if you have any of these conditions:  bleeding disorders  bipolar disorder or a family history of bipolar disorder  glaucoma  heart disease  high blood pressure  history of irregular heartbeat  history of low levels of calcium, magnesium, or potassium in the blood  if you often drink alcohol  liver disease  receiving electroconvulsive therapy  seizures  suicidal thoughts, plans, or attempt; a previous suicide attempt by you or a family member  take medicines that treat or prevent blood clots  thyroid disease  an unusual or allergic reaction to sertraline, other medicines, foods, dyes, or preservatives  pregnant or trying to get pregnant  breast-feeding How should I use this medicine? Take this medicine by mouth with a glass of water. Follow the directions on the prescription label. You can take it with or without food. Take your medicine at regular intervals. Do not take your medicine more often than directed. Do not stop taking this medicine suddenly except upon the advice of your doctor. Stopping this medicine too quickly may cause serious side effects or your condition may worsen. A special  MedGuide will be given to you by the pharmacist with each prescription and refill. Be sure to read this information carefully each time. Talk to your pediatrician regarding the use of this medicine in children. While this drug may be prescribed for children as young as 7 years for selected conditions, precautions do apply. Overdosage: If you think you have taken too much of this medicine contact a poison control center or emergency room at once. NOTE: This medicine is only for you. Do not share this medicine with others. What if I miss a dose? If you miss a dose, take it as soon as you can. If it is almost time for your next dose, take only that dose. Do not take double or extra doses. What may interact with this medicine? Do not take this medicine with any of the following medications:  cisapride  dronedarone  linezolid  MAOIs like Carbex, Eldepryl, Marplan, Nardil, and Parnate  methylene blue (injected into a vein)  pimozide  thioridazine This medicine may also interact with the following medications:  alcohol  amphetamines  aspirin and aspirin-like medicines  certain medicines for depression, anxiety, or psychotic disturbances  certain medicines for fungal infections like ketoconazole, fluconazole, posaconazole, and itraconazole  certain medicines for irregular heart beat like flecainide, quinidine, propafenone  certain medicines for migraine headaches like almotriptan, eletriptan, frovatriptan, naratriptan, rizatriptan, sumatriptan, zolmitriptan  certain medicines for sleep  certain medicines for seizures like carbamazepine, valproic acid, phenytoin  certain medicines that treat or prevent blood clots like warfarin, enoxaparin, dalteparin  cimetidine  digoxin  diuretics  fentanyl  isoniazid  lithium  NSAIDs, medicines for pain and inflammation, like ibuprofen or naproxen  other medicines that prolong the QT interval (cause an abnormal heart rhythm) like  dofetilide  rasagiline  safinamide  supplements like St. John's wort, kava kava, valerian  tolbutamide  tramadol  tryptophan This list may not describe all possible interactions. Give your health care provider a list of all the medicines, herbs, non-prescription drugs, or dietary supplements you use. Also tell them if you smoke, drink alcohol, or use illegal drugs. Some items may interact with your medicine. What should I watch for while using this medicine? Tell your doctor if your symptoms do not get better or if they get worse. Visit your doctor or health care professional for regular checks on your progress. Because it may take several weeks to see the full effects of this medicine, it is important to continue your treatment as prescribed by your doctor. Patients and their families should watch out for new or worsening thoughts of suicide or depression. Also watch out for sudden changes in feelings such as feeling anxious, agitated, panicky, irritable, hostile, aggressive, impulsive, severely restless, overly excited and hyperactive, or not being able to sleep. If this happens, especially at the beginning of treatment or after a change in dose, call your health care professional. Dennis Bast may get drowsy or dizzy. Do not drive, use machinery, or do anything that needs mental alertness until you know how this medicine affects you. Do not stand or sit up quickly, especially if you are an older patient. This reduces the risk of dizzy or fainting spells. Alcohol may interfere with the effect of this medicine. Avoid alcoholic drinks. Your mouth may get dry. Chewing sugarless gum or sucking hard candy, and drinking plenty of water may help. Contact your doctor if the problem does not go away or is severe. What side effects may I notice from receiving this medicine? Side effects that you should report to your doctor or health care professional as soon as possible:  allergic reactions like skin rash,  itching or hives, swelling of the face, lips, or tongue  anxious  black, tarry stools  changes in vision  confusion  elevated mood, decreased need for sleep, racing thoughts, impulsive behavior  eye pain  fast, irregular heartbeat  feeling faint or lightheaded, falls  feeling agitated, angry, or irritable  hallucination, loss of contact with reality  loss of balance or coordination  loss of memory  painful or prolonged erections  restlessness, pacing, inability to keep still  seizures  stiff muscles  suicidal thoughts or other mood changes  trouble sleeping  unusual bleeding or bruising  unusually weak or tired  vomiting Side effects that usually do not require medical attention (report to your doctor or health care professional if they continue or are bothersome):  change in appetite or weight  change in sex drive or performance  diarrhea  increased sweating  indigestion, nausea  tremors This list may not describe all possible side effects. Call your doctor for medical advice about side effects. You may report side effects to FDA at 1-800-FDA-1088. Where should I keep my medicine? Keep out of the reach of children. Store at room temperature between 15 and 30 degrees C (59 and 86 degrees F). Throw away any unused medicine after the expiration date. NOTE: This sheet is a summary. It may not cover all possible information. If you have questions about this medicine, talk to your doctor, pharmacist, or health care provider.  2021 Elsevier/Gold Standard (2020-09-23 09:18:23)
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
## 2021-02-25 DIAGNOSIS — F4310 Post-traumatic stress disorder, unspecified: Secondary | ICD-10-CM | POA: Insufficient documentation

## 2021-02-25 NOTE — Assessment & Plan Note (Signed)
Patient states she suffered depression as a child and was on medication but has not been on medication for several years.  Starting sertraline.  She is already going to therapy.  Follow-up in approximately 3 weeks.
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
## 2021-03-08 ENCOUNTER — Other Ambulatory Visit: Payer: Medicaid Other

## 2021-03-08 ENCOUNTER — Ambulatory Visit
Admission: RE | Admit: 2021-03-08 | Discharge: 2021-03-08 | Disposition: A | Discharge status: Home / Self Care | Payer: Medicaid Other | Source: Ambulatory Visit | Attending: Family Medicine | Admitting: Family Medicine

## 2021-03-08 DIAGNOSIS — R1032 Left lower quadrant pain: Secondary | ICD-10-CM

## 2021-03-08 DIAGNOSIS — I878 Other specified disorders of veins: Secondary | ICD-10-CM | POA: Diagnosis not present

## 2021-03-08 DIAGNOSIS — N200 Calculus of kidney: Secondary | ICD-10-CM | POA: Diagnosis not present

## 2021-03-24 NOTE — Progress Notes (Signed)
    SUBJECTIVE:   CHIEF COMPLAINT / HPI:   Depression: started sertraline on 4/1. Not sure if it's helping.  Wants to try not taking it int he month of may.    Right ear is hurting 2 weeks.  Has a hx of seasonal allergies.  Just bought some allergy medicine from the dollar store a few days ago. Unsure what it's called. .    Left arm: sat before easter her left arm was throbbing/burning/aching.  Went away next day then came back.  Still hurting now.  Not numb or weak.    Pelvic pain - patient had  intercourse last Friday with new partner. Not using condoms. . Suprapubic pain  Started Tuesday.  Has had PID before and currently  hurts in the suprapubic region when she has cough or sneeze.  No fevers.  Itching, discharge, odor.    Wants to get on birth control.  Has used depo shot in the past but not in 7 years.  10th of this month was her last menstrual cycle. She is due Sunday for her next period.  Pt desires depo shot over other BC methods.   Pt no longer having the dysuria symptoms she was previously having.  Discussed her CT scan and the presence of unobstructed stones.    PERTINENT  PMH / PSH: PID  OBJECTIVE:   BP 112/84   Pulse 74   Ht 5\' 3"  (1.6 m)   Wt 227 lb 9.6 oz (103.2 kg)   LMP 03/06/2021 (Approximate)   SpO2 99%   BMI 40.32 kg/m   Gen: alert, oriented.  No acute distress.  HEENT: Some possible bulging of the right TM. Otherwise normal.  CV: RRR.  Pulm: lctab GU: no lesions. No discharge. Normal appearing vagina.  MSK: tenderness of the left shoulder.  UE strength equal b/l.  Normal sensation of the UE b/l.    ASSESSMENT/PLAN:   Depressed mood Pt unsure if it's helping but states overall she is feeling better.  Just not sure if she can attribute it to the medicine or changes in her situation.  On starting dose of sertraline and on it for 4 weeks.  Advised her she can stop but not to take it intermittently as it takes weeks to build up in her system and show  effectiveness.    Seasonal allergies Normal ear exam.  Ear pain due to eustachian tube dysfunction likely 2/2 allergies.  prescirbed claritin and flonase.    Pelvic pain Pt complains of pelvic pain.  Is tender suprapubically. Recent sexual intercourse prior to onset.  Will get STI testing.  Has hx of stones.  Recent CT showed nonobstructing stones. Will start depo today for [facility].  Advised pt to use protection during intercourse.    Acute pain of left shoulder Likely rotator cuff tendinitis.  Pt has recent referral to sports med for carpal tunnel.  Has not had appt scheduled.  Gave her the clinic phone number. Advised her to call to see if referral has been received.  Advised her she can discuss further treatment of shoulder with them as well.  Advised her to use heat, tylenol, nsaids, and shoulder stretches in the meantime.  Avoid heavy lifting.   Encounter for other contraceptive management Gave depo shot today.      Benay Pike, MD [place]

## 2021-03-25 ENCOUNTER — Other Ambulatory Visit: Payer: Self-pay

## 2021-03-25 ENCOUNTER — Other Ambulatory Visit (HOSPITAL_COMMUNITY)
Admission: RE | Admit: 2021-03-25 | Discharge: 2021-03-25 | Disposition: A | Discharge status: Home / Self Care | Payer: Medicaid Other | Source: Ambulatory Visit | Attending: Family Medicine | Admitting: Family Medicine

## 2021-03-25 ENCOUNTER — Ambulatory Visit (INDEPENDENT_AMBULATORY_CARE_PROVIDER_SITE_OTHER): Payer: Medicaid Other | Admitting: Family Medicine

## 2021-03-25 ENCOUNTER — Encounter: Payer: Self-pay | Admitting: Family Medicine

## 2021-03-25 VITALS — BP 112/84 | HR 74 | Ht 63.0 in | Wt 227.6 lb

## 2021-03-25 DIAGNOSIS — J302 Other seasonal allergic rhinitis: Secondary | ICD-10-CM

## 2021-03-25 DIAGNOSIS — R102 Pelvic and perineal pain: Secondary | ICD-10-CM

## 2021-03-25 DIAGNOSIS — M25512 Pain in left shoulder: Secondary | ICD-10-CM

## 2021-03-25 DIAGNOSIS — Z308 Encounter for other contraceptive management: Secondary | ICD-10-CM

## 2021-03-25 DIAGNOSIS — R4589 Other symptoms and signs involving emotional state: Secondary | ICD-10-CM

## 2021-03-25 DIAGNOSIS — Z309 Encounter for contraceptive management, unspecified: Secondary | ICD-10-CM

## 2021-03-25 LAB — POCT URINE PREGNANCY: Preg Test, Ur: NEGATIVE

## 2021-03-25 MED ORDER — MEDROXYPROGESTERONE ACETATE 150 MG/ML IM SUSP
150.0000 mg | Freq: Once | INTRAMUSCULAR | Status: AC
  Administered 2021-03-25: 150 mg via INTRAMUSCULAR

## 2021-03-25 MED ORDER — LORATADINE 10 MG PO TBDP: 10.0000 mg | ORAL_TABLET | Freq: Every day | ORAL | 12 refills | Status: DC

## 2021-03-25 MED ORDER — FLUTICASONE PROPIONATE 50 MCG/ACT NA SUSP: 2.0000 | Freq: Every day | NASAL | 6 refills | Status: DC

## 2021-03-25 NOTE — Patient Instructions (Signed)
It was nice to see you today,  We made the following changes today: - For your ear pain this is most likely due to allergies.  I have prescribed Claritin which is also available over-the-counter.  I have prescribed Flonase which is also available over-the-counter.  The Flonase you should do 2 sprays in each nostril once a day for least 1 month every day.  Make sure you point away the outside of your nostrils when spraying this.  - It is okay to stop taking your sertraline if you feel like you would like to see how you do without it.  Just remember that it takes a few weeks for it to get out of your system and a few weeks to get back in if you restart it.  - The number for the sports medicine clinic is [phone].  Call them to see if you have a referral sent in already.  If not I can resend it.  You can also talk to them about your left shoulder pain which I believe is rotator cuff tendinitis.  In the meantime you can take naproxen and Tylenol as needed.  You can use heat to help with the pain as well.  - I will get some STI testing for your pelvic pain.  If positive I will call in a prescription for you.  - We will give you the Depo shot today.  This is due every 3 months.  Your pregnancy test was negative today.  Have a great day,  Connie Pore, MD

## 2021-03-26 LAB — HIV ANTIBODY (ROUTINE TESTING W REFLEX): HIV Screen 4th Generation wRfx: NONREACTIVE

## 2021-03-26 LAB — RPR: RPR Ser Ql: NONREACTIVE

## 2021-03-28 DIAGNOSIS — R102 Pelvic and perineal pain: Secondary | ICD-10-CM

## 2021-03-28 DIAGNOSIS — M25512 Pain in left shoulder: Secondary | ICD-10-CM | POA: Insufficient documentation

## 2021-03-28 DIAGNOSIS — J302 Other seasonal allergic rhinitis: Secondary | ICD-10-CM | POA: Insufficient documentation

## 2021-03-28 HISTORY — DX: Pelvic and perineal pain: R10.2

## 2021-03-28 LAB — CERVICOVAGINAL ANCILLARY ONLY
Bacterial Vaginitis (gardnerella): NEGATIVE
Candida Glabrata: NEGATIVE
Candida Vaginitis: NEGATIVE
Chlamydia: NEGATIVE
Comment: NEGATIVE
Comment: NEGATIVE
Comment: NEGATIVE
Comment: NEGATIVE
Comment: NEGATIVE
Comment: NORMAL
Neisseria Gonorrhea: NEGATIVE
Trichomonas: NEGATIVE

## 2021-03-28 NOTE — Assessment & Plan Note (Signed)
Likely rotator cuff tendinitis.  Pt has recent referral to sports med for carpal tunnel.  Has not had appt scheduled.  Gave her the clinic phone number. Advised her to call to see if referral has been received.  Advised her she can discuss further treatment of shoulder with them as well.  Advised her to use heat, tylenol, nsaids, and shoulder stretches in the meantime.  Avoid heavy lifting.

## 2021-03-28 NOTE — Assessment & Plan Note (Signed)
Pt complains of pelvic pain.  Is tender suprapubically. Recent sexual intercourse prior to onset.  Will get STI testing.  Has hx of stones.  Recent CT showed nonobstructing stones. Will start depo today for [facility].  Advised pt to use protection during intercourse.

## 2021-03-28 NOTE — Assessment & Plan Note (Signed)
Gave depo shot today. 

## 2021-03-28 NOTE — Assessment & Plan Note (Signed)
Normal ear exam.  Ear pain due to eustachian tube dysfunction likely 2/2 allergies.  prescirbed claritin and flonase.

## 2021-03-28 NOTE — Assessment & Plan Note (Signed)
Pt unsure if it's helping but states overall she is feeling better.  Just not sure if she can attribute it to the medicine or changes in her situation.  On starting dose of sertraline and on it for 4 weeks.  Advised her she can stop but not to take it intermittently as it takes weeks to build up in her system and show effectiveness.

## 2021-03-29 DIAGNOSIS — F331 Major depressive disorder, recurrent, moderate: Secondary | ICD-10-CM | POA: Diagnosis not present

## 2021-04-01 ENCOUNTER — Ambulatory Visit: Payer: Medicaid Other | Admitting: Family Medicine

## 2021-04-06 ENCOUNTER — Ambulatory Visit: Payer: Medicaid Other | Admitting: Family Medicine

## 2021-04-11 DIAGNOSIS — F331 Major depressive disorder, recurrent, moderate: Secondary | ICD-10-CM | POA: Diagnosis not present

## 2021-04-13 ENCOUNTER — Ambulatory Visit (INDEPENDENT_AMBULATORY_CARE_PROVIDER_SITE_OTHER): Payer: Medicaid Other | Admitting: Family Medicine

## 2021-04-13 ENCOUNTER — Other Ambulatory Visit: Payer: Self-pay

## 2021-04-13 VITALS — BP 126/74 | Ht 63.0 in | Wt 230.0 lb

## 2021-04-13 DIAGNOSIS — M25531 Pain in right wrist: Secondary | ICD-10-CM | POA: Diagnosis not present

## 2021-04-13 DIAGNOSIS — M25532 Pain in left wrist: Secondary | ICD-10-CM | POA: Diagnosis not present

## 2021-04-13 MED ORDER — METHYLPREDNISOLONE ACETATE 40 MG/ML IJ SUSP
20.0000 mg | Freq: Once | INTRAMUSCULAR | Status: AC
  Administered 2021-04-13: 20 mg via INTRA_ARTICULAR

## 2021-04-13 NOTE — Progress Notes (Signed)
PCP: Benay Pike, MD  Subjective:   HPI: Patient is a 40 y.o. female here for bilateral carpal tunnel syndrome.  Ongoing for 4 months starting in January.  Patient states she started to wear wrist splints in February.  She has not had any improvements with the wrist splints in which she does endorse she wears nightly.  Her work she is a little stronger and [employer].  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  Has a history of carpal tunnel.  States that her entire hand has tingling and sometimes numbness that radiates into her elbow.  It is worse on the right than the left.  She has not tried anything else for it.  Past Medical History:  Diagnosis Date  . Anxiety    no meds  . Asthma    inhaler rarely used - twice year  . Depression    no current Tx.  . Febrile illness, acute 01/2011   Hospitalized for 6 days  . Gestational diabetes   . Headache   . Herpes 2008  . Hx of bronchitis 09/2012  . IBS (irritable bowel syndrome)   . Renal stone 08/26/2014  . Seasonal allergies     Current Outpatient Medications on File Prior to Visit  Medication Sig Dispense Refill  . fluticasone (FLONASE) 50 MCG/ACT nasal spray Place 2 sprays into both nostrils daily. 16 g 6  . loratadine (CLARITIN REDITABS) 10 MG dissolvable tablet Take 1 tablet (10 mg total) by mouth daily. As needed for allergy symptoms 31 tablet 12  . metroNIDAZOLE (FLAGYL) 500 MG tablet Take 1 tablet (500 mg total) by mouth 2 (two) times daily. 14 tablet 0  . sertraline (ZOLOFT) 25 MG tablet Take 1 tablet (25 mg total) by mouth daily. 30 tablet 0  . valACYclovir (VALTREX) 500 MG tablet Take 1 tablet (500 mg total) by mouth daily. 90 tablet 1   No current facility-administered medications on file prior to visit.    Past Surgical History:  Procedure Laterality Date  . CESAREAN SECTION  2000 & 2004   "1st one I dilated 1cm, then the 2nd one was a rpt  . CESAREAN SECTION N/A 06/15/2017    Procedure: CESAREAN SECTION;  Surgeon: Osborne Oman, MD;  Location: [place];  Service: Obstetrics;  Laterality: N/A;  . CYSTOSCOPY WITH RETROGRADE PYELOGRAM, URETEROSCOPY AND STENT PLACEMENT Left 08/26/2014   Procedure: CYSTOSCOPY WITH RETROGRADE PYELOGRAM, URETEROSCOPY, LASER, STONE EXTRACTION WITH BASKET;  Surgeon: Raynelle Bring, MD;  Location: [facility];  Service: Urology;  Laterality: Left;  . HERNIA REPAIR    . HYSTEROSCOPY N/A 03/24/2013   Procedure: HYSTEROSCOPY / Dilitation and curettage/endometrial curettings;  Surgeon: Lavonia Drafts, MD;  Location: [facility];  Service: Gynecology;  Laterality: N/A;  Diagnostic hysteroscopy  . TMJ ARTHROPLASTY    . WISDOM TOOTH EXTRACTION      Allergies  Allergen Reactions  . Flagyl [Metronidazole] Nausea And Vomiting  . Latex Itching  . Penicillins Hives and Other (See Comments)    Childhood allergy Has patient had a PCN reaction causing immediate rash, facial/tongue/throat swelling, SOB or lightheadedness with hypotension: unknown Has patient had a PCN reaction causing severe rash involving mucus membranes or skin necrosis: unknown Has patient had a PCN reaction that required hospitalization unknown Has patient had a PCN reaction occurring within the last 10 years: no If all of the above answers are "NO", then may proceed with Cephalosporin use.  Social History   Socioeconomic History  . Marital status: Single    Spouse name: Not on file  . Number of children: Not on file  . Years of education: Not on file  . Highest education level: Not on file  Occupational History  . Not on file  Tobacco Use  . Smoking status: Former Research scientist (life sciences)  . Smokeless tobacco: Never Used  Vaping Use  . Vaping Use: Never used  Substance and Sexual Activity  . Alcohol use: Yes    Alcohol/week: 1.0 standard drink    Types: 1 Standard drinks or equivalent per week    Comment: occasionally  . Drug use: No  . Sexual activity: Yes     Partners: Male    Birth control/protection: None  Other Topics Concern  . Not on file  Social History Narrative  . Not on file   Social Determinants of Health   Financial Resource Strain: Not on file  Food Insecurity: Not on file  Transportation Needs: Not on file  Physical Activity: Not on file  Stress: Not on file  Social Connections: Not on file  Intimate Partner Violence: Not on file    Family History  Problem Relation Age of Onset  . Hypertension Mother   . Diabetes Mother     BP 126/74   Ht 5\' 3"  (1.6 m)   Wt 230 lb (104.3 kg)   BMI 40.74 kg/m    Review of Systems: See HPI above.     Objective:  Physical Exam:  Gen: NAD, comfortable in exam room  No gross deformity, swelling, bruising of BUE. No TTP over wrists BUE strength 5/5.   Sensation intact to light touch but more less pronounced on the right.   Negative tinel sign. Positive phalen's NV intact distal BUEs.  Limited MSK U/S: Right Wrist Median nerve well visualized and measure to have diameter of about 6.36mm and 2.67mm depth. Surrounding fluid noted. Impression: Normal size median nerve   Assessment & Plan:  1. Given her ongoing symptoms not responding to conservative management with NSAIDs OTC and night splints and her median nerve on ultrasound was normal size I am trying a diagnotic and hopefully therapeutic injection today for carpal tunnel syndrome. Procedure noted below. If this gives her relief she can return and we can try injecting the other wrist. If no improvement or relief from symptoms I would consider getting a EMG/nerve conduction study.  Written and verbal consent was obtained after discussing the risks and benefits of the procedure with the patient. A timeout was performed and the correct site and side was identified and confirmed.  The right volar wrist was cleaned in sterile fashion betadine and alcohol pad. Under ultrasound guidance with doppler on during the entirety of the  procedure 1/2 cc of 40 mg Depo-medrol and 1/2 cc 1% Lidocaine was injected using a anterior ulnar approach into the carpal tunnel with direct visualization of the medication surrounding the median nerve. A 5 cc syringe and 25 gauge 1 and 1/2 in needle. No complications were encountered. Minimal blood loss. A band aid was applied.   Resident Attestation   I saw and evaluated the patient, performing the key elements of the service.I  personally performed or re-performed the history, physical exam, and medical decision making activities of this service and have verified that the service and findings are accurately documented in the resident's note. I developed the management plan that is described in the resident's note, and I agree with the content,  with my edits above.  Harolyn Rutherford, DO [facility], PGY-4  Addendum:  I was the preceptor for this visit and available for immediate consultation.  Karlton Lemon MD [facility]

## 2021-04-13 NOTE — Patient Instructions (Signed)
It was great to meet you today! Thank you for letting me participate in your care!  Today, we discussed your wrist pain which could be due to carpal tunnel syndrome. I performed an injection and I hope that gives you relief. Continue to wear your night splints and return if you your pain returns.  Be well, Harolyn Rutherford, DO PGY-4, Sports Medicine Fellow [place]

## 2021-04-18 DIAGNOSIS — F331 Major depressive disorder, recurrent, moderate: Secondary | ICD-10-CM | POA: Diagnosis not present

## 2021-04-21 ENCOUNTER — Ambulatory Visit: Payer: Medicaid Other | Admitting: Family Medicine

## 2021-04-28 DIAGNOSIS — F331 Major depressive disorder, recurrent, moderate: Secondary | ICD-10-CM | POA: Diagnosis not present

## 2021-05-01 ENCOUNTER — Other Ambulatory Visit: Payer: Self-pay | Admitting: Family Medicine

## 2021-05-02 DIAGNOSIS — F331 Major depressive disorder, recurrent, moderate: Secondary | ICD-10-CM | POA: Diagnosis not present

## 2021-05-04 ENCOUNTER — Ambulatory Visit: Payer: Medicaid Other | Admitting: Family Medicine

## 2021-05-05 NOTE — Telephone Encounter (Signed)
See previous note

## 2021-05-10 ENCOUNTER — Ambulatory Visit: Payer: Medicaid Other | Admitting: Family Medicine

## 2021-05-10 ENCOUNTER — Other Ambulatory Visit: Payer: Self-pay

## 2021-05-10 ENCOUNTER — Encounter: Payer: Self-pay | Admitting: Family Medicine

## 2021-05-10 VITALS — Ht 63.0 in | Wt 227.2 lb

## 2021-05-10 DIAGNOSIS — N939 Abnormal uterine and vaginal bleeding, unspecified: Secondary | ICD-10-CM | POA: Diagnosis not present

## 2021-05-10 DIAGNOSIS — F331 Major depressive disorder, recurrent, moderate: Secondary | ICD-10-CM | POA: Diagnosis not present

## 2021-05-10 DIAGNOSIS — R4589 Other symptoms and signs involving emotional state: Secondary | ICD-10-CM

## 2021-05-10 MED ORDER — PRAZOSIN HCL 1 MG PO CAPS: 1.0000 mg | ORAL_CAPSULE | Freq: Every day | ORAL | 1 refills | Status: DC

## 2021-05-10 MED ORDER — SERTRALINE HCL 50 MG PO TABS: 50.0000 mg | ORAL_TABLET | Freq: Every day | ORAL | 1 refills | Status: DC

## 2021-05-10 NOTE — Patient Instructions (Signed)
It was nice to see you today,  We did the following changes today: - I increased your Zoloft to 50 mg.  You can take 2 of your 25 mg pills if we have any leftover.  I have also sent in a new prescription for you.  The new prescription will be 50 mg only take 1 of those.  - For your nightmares I have prescribed prazosin.  This is a 1 mg pill.  Take this at night before bed.  After 1 week if you have not noticed any improvement you can increase it to 2 mg or 2 pills.  After that I would like to follow-up with you in the office.  - I would like to see you back in 2 weeks.  June 30 is my last day so you will need to schedule something before that time.  Have a nice day,  [name], MD
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
## 2021-05-10 NOTE — Assessment & Plan Note (Signed)
Patient continues to take her 25 mg sertraline since our last visit.  Does not feel like it is helping. - Increase to 50 mg sertraline - Added 1 mg prazosin for nightmares.  Advised patient she can increase to 2 mg in 1 week if symptoms not improving. - Follow-up in 2 weeks to discuss prazosin and sertraline dosing - Continue therapy sessions.

## 2021-05-10 NOTE — Assessment & Plan Note (Signed)
Started after her Depo shot.  When she had previously been on Depo she became amenorrheic.  Now she has spotting with regular monthly periods in between.  Not currently sexually active.  Patient states she will try the Depo 1 more time when it is due.  Discussed alternatives such as Nexplanon.

## 2021-05-18 DIAGNOSIS — F331 Major depressive disorder, recurrent, moderate: Secondary | ICD-10-CM | POA: Diagnosis not present

## 2021-05-24 ENCOUNTER — Ambulatory Visit: Payer: Medicaid Other | Admitting: Family Medicine

## 2021-05-24 NOTE — Progress Notes (Deleted)
    SUBJECTIVE:   CHIEF COMPLAINT / HPI:   Depression: 50mg  sert and 1mg  prazosin.  Did she increase prazosin?   Vaginal spotting:   PERTINENT  PMH / PSH: ***  OBJECTIVE:   There were no vitals taken for this visit.  ***  ASSESSMENT/PLAN:   No problem-specific Assessment & Plan notes found for this encounter.     Benay Pike, MD [place]   {    This will disappear when note is signed, click to select method of visit    :1}

## 2021-05-25 DIAGNOSIS — F331 Major depressive disorder, recurrent, moderate: Secondary | ICD-10-CM | POA: Diagnosis not present

## 2021-06-01 DIAGNOSIS — F331 Major depressive disorder, recurrent, moderate: Secondary | ICD-10-CM | POA: Diagnosis not present

## 2021-06-09 DIAGNOSIS — F331 Major depressive disorder, recurrent, moderate: Secondary | ICD-10-CM | POA: Diagnosis not present

## 2021-06-15 DIAGNOSIS — F331 Major depressive disorder, recurrent, moderate: Secondary | ICD-10-CM | POA: Diagnosis not present

## 2021-06-22 DIAGNOSIS — F4310 Post-traumatic stress disorder, unspecified: Secondary | ICD-10-CM | POA: Diagnosis not present

## 2021-06-22 DIAGNOSIS — F331 Major depressive disorder, recurrent, moderate: Secondary | ICD-10-CM | POA: Diagnosis not present

## 2021-06-23 NOTE — Progress Notes (Deleted)
  SUBJECTIVE:   CHIEF COMPLAINT / HPI:   Depression: '50mg'$  sertraline, 1 or 2 mg prazosin  AUB/BC: depo (?)  PERTINENT  PMH / PSH: PTSD  OBJECTIVE:   There were no vitals taken for this visit. ***  General: NAD, pleasant, able to participate in exam CV: RRR, no murmurs. Pulm: CTAB, normal effort, No wheezes, rales or rhonchi Psych: Normal affect and mood  ASSESSMENT/PLAN:   No problem-specific Assessment & Plan notes found for this encounter.     Wells Guiles, DO [place]    {    This will disappear when note is signed, click to select method of visit    :1}

## 2021-06-24 ENCOUNTER — Ambulatory Visit: Payer: Medicaid Other | Admitting: Student

## 2021-06-24 DIAGNOSIS — N939 Abnormal uterine and vaginal bleeding, unspecified: Secondary | ICD-10-CM

## 2021-06-24 DIAGNOSIS — R4589 Other symptoms and signs involving emotional state: Secondary | ICD-10-CM

## 2021-06-29 DIAGNOSIS — F4310 Post-traumatic stress disorder, unspecified: Secondary | ICD-10-CM | POA: Diagnosis not present

## 2021-06-29 DIAGNOSIS — F331 Major depressive disorder, recurrent, moderate: Secondary | ICD-10-CM | POA: Diagnosis not present

## 2021-07-06 DIAGNOSIS — F4310 Post-traumatic stress disorder, unspecified: Secondary | ICD-10-CM | POA: Diagnosis not present

## 2021-07-06 DIAGNOSIS — F331 Major depressive disorder, recurrent, moderate: Secondary | ICD-10-CM | POA: Diagnosis not present

## 2021-07-07 ENCOUNTER — Encounter: Payer: Self-pay | Admitting: Sports Medicine

## 2021-07-07 ENCOUNTER — Ambulatory Visit (INDEPENDENT_AMBULATORY_CARE_PROVIDER_SITE_OTHER): Payer: Medicaid Other

## 2021-07-07 ENCOUNTER — Other Ambulatory Visit: Payer: Self-pay

## 2021-07-07 ENCOUNTER — Ambulatory Visit: Payer: Medicaid Other | Admitting: Sports Medicine

## 2021-07-07 ENCOUNTER — Other Ambulatory Visit: Payer: Self-pay | Admitting: Sports Medicine

## 2021-07-07 DIAGNOSIS — M722 Plantar fascial fibromatosis: Secondary | ICD-10-CM | POA: Diagnosis not present

## 2021-07-07 DIAGNOSIS — M79671 Pain in right foot: Secondary | ICD-10-CM | POA: Diagnosis not present

## 2021-07-07 DIAGNOSIS — M84374A Stress fracture, right foot, initial encounter for fracture: Secondary | ICD-10-CM | POA: Diagnosis not present

## 2021-07-07 MED ORDER — PREDNISONE 10 MG (21) PO TBPK: ORAL_TABLET | ORAL | 0 refills | Status: DC

## 2021-07-07 MED ORDER — DICLOFENAC SODIUM 75 MG PO TBEC: 75.0000 mg | DELAYED_RELEASE_TABLET | Freq: Two times a day (BID) | ORAL | 0 refills | Status: DC

## 2021-07-07 NOTE — Progress Notes (Signed)
  SUBJECTIVE:   CHIEF COMPLAINT / HPI: Connie Jenkins is a 40 y/o F presenting for depo shot, new onset vaginal discharge, and new onset dizziness episodes.  Pt states she has not been sexually active in the past in the past 4 months. She still has spotting while on the depo but does not have periods while on it. Last injection was 3 months ago.   She has been having gray-white vaginal discharge that has a fishy odor in the past couple weeks. She is not concerned of STD due to being negative when last checked and not being sexually active since that time period. She also states she has had bacterial vaginosis in the past and it presented the same way as what she is currently experiencing. Denies abdominal and suprapubic pain. Denies any dysuria or hematuria.   Pt has also been dizziness episodes x 1 week. She states that present when she moves her head quickly such as bringing her head up. It feels as if the room is spinning and then resolves after 15 seconds which is accompanied with nausea. She denies any episodes of passing out or vomiting. They happen infrequently but these episodes are new for her.  Pt states she is having significant life stressors currently such as recently fracturing her foot when running away from a snake. She is unsure if her Zoloft is helping with her depressed mood.   PERTINENT  PMH / PSH: PTSD, Depression  OBJECTIVE:   BP 92/60   Pulse 95   Ht '5\' 3"'$  (1.6 m)   Wt 234 lb 4 oz (106.3 kg)   LMP 03/14/2021   SpO2 98%   BMI 41.50 kg/m    General: NAD, pleasant, able to participate in exam HEENT: TM intact Cardiac: RRR, no murmurs. Respiratory: CTAB, normal effort, No wheezes, rales or rhonchi Abdomen: soft, nontender, nondistended, normoactive bowel sounds  Pelvic Exam: External: normal female genitalia without lesions or masses,  Internal: no masses or abnormalities appreciated on bimanual exam Vagina: gray-white discharge moderately present on vaginal  canal Cervix: not visualized Samples for Wet prep obtained  ASSESSMENT/PLAN:   Depressed mood Discussed increasing dosage of Sertraline with patient as she does feel very stressed in her life currently and does not feel that her medication is adding much benefit. PHQ-9 notably scoring an 18 but patient did not have suicidal thoughts. Pt opted to not increased medication at this time.  Encounter for other contraceptive management Gave depo shot today.  BV (bacterial vaginosis) Pelvic exam performed today and swab collected to test for STDs and BV due to gray-white vaginal discharge with fishy odor. Likely BV. Will treat when results return.  BPPV (benign paroxysmal positional vertigo), unspecified laterality Discussed etiology of BPPV in addition to Epley Maneuver. Handout given. Advised pt to attempt Epley maneuver at home with assistance 1-2 times a day until symptoms resolve. Advised return to clinic if symptoms persist or worsen.     Connie Jenkins, [place]

## 2021-07-07 NOTE — Patient Instructions (Addendum)
It was great to see you today! Thank you for choosing [facility] for your primary care. Connie Jenkins was seen for depression and depo shot today.   Our plans for today were:  - Pelvic exam and wet mount for concerns of vaginal discharge. -Discuss your dizzy spells which you have been having for the last 1 to 2 weeks -Depo shot    Attached is the Epley maneuver which should aid in Dizzy spells you are having for benign positional paroxysmal vertigo (BPPV).  If these do not fix your dizzy spells, please return to clinic for further work-up.  You have been tested today for concern of bacterial vaginosis.  We will call you with results and prescription information based on the results.  Been administered Depo shot after having a negative pregnancy test.  Next due date for Depo shot would be in 3 months.   Take care and seek immediate care sooner if you develop any concerns.   Thank you for allowing me to participate in your care, Wells Guiles, DO PGY-1

## 2021-07-07 NOTE — Assessment & Plan Note (Deleted)
  Patient is attempting depo shot one more time for vaginal spotting that occurs .Connie KitchenMarland Jenkins

## 2021-07-07 NOTE — Assessment & Plan Note (Addendum)
Gave depo shot today.

## 2021-07-07 NOTE — Assessment & Plan Note (Addendum)
Discussed increasing dosage of Sertraline with patient as she does feel very stressed in her life currently and does not feel that her medication is adding much benefit. PHQ-9 notably scoring an 18 but patient did not have suicidal thoughts. Pt opted to not increased medication at this time.

## 2021-07-07 NOTE — Progress Notes (Signed)
Subjective: FLORIA ROWLEE is a 40 y.o. female patient who presents to office for evaluation of Right> Left foot pain. Patient complains of progressive pain especially over the last few days that started to hurt worse at the medial foot and arch area happen after she was running because she saw a snake.  Patient denies any other injury and states that both her heels still hurt.  No other pedal complaints noted.  Patient Active Problem List   Diagnosis Date Noted   Vaginal spotting 05/10/2021   Seasonal allergies 03/28/2021   Acute pain of left shoulder 03/28/2021   Pelvic pain 03/28/2021   PTSD (post-traumatic stress disorder) 02/25/2021   Dysuria 02/09/2021   Carpal tunnel syndrome 02/09/2021   Depressed mood 02/09/2021   Vulvovaginitis 01/26/2021   Encounter for Papanicolaou smear for cervical cancer screening 01/26/2021   Encounter for other contraceptive management 07/18/2017   S/P cesarean section 06/15/2017   Gestational diabetes mellitus (GDM) affecting pregnancy 04/08/2017   HSV (herpes simplex virus) infection 11/13/2016   Migraines 11/13/2016   CONDYLOMA ACUMINATUM 01/24/2007    Current Outpatient Medications on File Prior to Visit  Medication Sig Dispense Refill   cyclobenzaprine (FLEXERIL) 5 MG tablet Take 5 mg by mouth 3 (three) times daily as needed.     fluticasone (FLONASE) 50 MCG/ACT nasal spray Place 2 sprays into both nostrils daily. 16 g 6   loratadine (CLARITIN REDITABS) 10 MG dissolvable tablet Take 1 tablet (10 mg total) by mouth daily. As needed for allergy symptoms 31 tablet 12   metroNIDAZOLE (FLAGYL) 500 MG tablet Take 1 tablet (500 mg total) by mouth 2 (two) times daily. 14 tablet 0   prazosin (MINIPRESS) 1 MG capsule Take 1 capsule (1 mg total) by mouth at bedtime. 30 capsule 1   sertraline (ZOLOFT) 50 MG tablet Take 1 tablet (50 mg total) by mouth daily. 90 tablet 1   valACYclovir (VALTREX) 500 MG tablet Take 1 tablet (500 mg total) by mouth daily. 90  tablet 1   No current facility-administered medications on file prior to visit.    Allergies  Allergen Reactions   Flagyl [Metronidazole] Nausea And Vomiting   Latex Itching   Penicillins Hives and Other (See Comments)    Childhood allergy Has patient had a PCN reaction causing immediate rash, facial/tongue/throat swelling, SOB or lightheadedness with hypotension: unknown Has patient had a PCN reaction causing severe rash involving mucus membranes or skin necrosis: unknown Has patient had a PCN reaction that required hospitalization unknown Has patient had a PCN reaction occurring within the last 10 years: no If all of the above answers are "NO", then may proceed with Cephalosporin use.      Objective:  General: Alert and oriented x3 in no acute distress  Dermatology: No open lesions bilateral lower extremities, no webspace macerations, no ecchymosis bilateral, all nails x 10 are well manicured.  Vascular: No significant swelling bilateral.. Dorsalis Pedis and Posterior Tibial pedal pulses 2/4, Capillary Fill Time 3 seconds,(+) pedal hair growth bilateral, Temperature gradient within normal limits.  Neurology: Johney Maine sensation intact via light touch bilateral.  Musculoskeletal: There is tenderness with palpation at tarsal metatarsal joint medial aspect at the first metatarsal base and cuneiform area on the right.  There is also pain along the plantar fascial insertions bilateral right greater than left, range of motion limited on the right due to pain.  Strength within normal limits in all groups bilateral.   Gait: Unassisted, Antalgic gait  Xrays  Right Foot  Impression: Questionable avulsion fleck at the first metatarsal base/medial cuneiform there is midtarsal breech supportive of pes planus deformity and unchanged calcaneal spurs present.    Assessment and Plan: Problem List Items Addressed This Visit   None Visit Diagnoses     Pain in right foot    -  Primary    Relevant Orders   DG Foot Complete Right   Stress fracture, right foot, initial encounter for fracture       Plantar fasciitis, bilateral            -Complete examination performed -Xrays reviewed -Discussed treatement options for ?  Avulsion/stress fracture at medial right 1st TMJ and chronic heel pain -Rx prednisone and diclofenac to take as instructed -Dispensed surgical shoe to patient to wear at all times and instructed on use -Recommend protection, rest, ice, elevation daily until symptoms improve -Patient to return to office in 4 weeks for serial x-rays to assess healing and to follow-up on chronic foot pain or sooner if condition worsens.  Landis Martins, DPM

## 2021-07-08 ENCOUNTER — Other Ambulatory Visit (HOSPITAL_COMMUNITY)
Admission: RE | Admit: 2021-07-08 | Discharge: 2021-07-08 | Disposition: A | Discharge status: Home / Self Care | Payer: Medicaid Other | Source: Ambulatory Visit | Attending: Family Medicine | Admitting: Family Medicine

## 2021-07-08 ENCOUNTER — Encounter: Payer: Self-pay | Admitting: Student

## 2021-07-08 ENCOUNTER — Ambulatory Visit (INDEPENDENT_AMBULATORY_CARE_PROVIDER_SITE_OTHER): Payer: Medicaid Other | Admitting: Student

## 2021-07-08 VITALS — BP 92/60 | HR 95 | Ht 63.0 in | Wt 234.2 lb

## 2021-07-08 DIAGNOSIS — H8110 Benign paroxysmal vertigo, unspecified ear: Secondary | ICD-10-CM | POA: Diagnosis not present

## 2021-07-08 DIAGNOSIS — B9689 Other specified bacterial agents as the cause of diseases classified elsewhere: Secondary | ICD-10-CM

## 2021-07-08 DIAGNOSIS — Z113 Encounter for screening for infections with a predominantly sexual mode of transmission: Secondary | ICD-10-CM | POA: Diagnosis not present

## 2021-07-08 DIAGNOSIS — Z3200 Encounter for pregnancy test, result unknown: Secondary | ICD-10-CM | POA: Diagnosis not present

## 2021-07-08 DIAGNOSIS — N760 Acute vaginitis: Secondary | ICD-10-CM

## 2021-07-08 DIAGNOSIS — R4589 Other symptoms and signs involving emotional state: Secondary | ICD-10-CM | POA: Diagnosis not present

## 2021-07-08 DIAGNOSIS — Z789 Other specified health status: Secondary | ICD-10-CM

## 2021-07-08 DIAGNOSIS — Z308 Encounter for other contraceptive management: Secondary | ICD-10-CM | POA: Diagnosis not present

## 2021-07-08 DIAGNOSIS — N939 Abnormal uterine and vaginal bleeding, unspecified: Secondary | ICD-10-CM

## 2021-07-08 LAB — POCT URINE PREGNANCY: Preg Test, Ur: NEGATIVE

## 2021-07-08 MED ORDER — MEDROXYPROGESTERONE ACETATE 150 MG/ML IM SUSP
150.0000 mg | Freq: Once | INTRAMUSCULAR | Status: AC
  Administered 2021-07-08: 150 mg via INTRAMUSCULAR

## 2021-07-08 NOTE — Assessment & Plan Note (Signed)
Discussed etiology of BPPV in addition to Epley Maneuver. Handout given. Advised pt to attempt Epley maneuver at home with assistance 1-2 times a day until symptoms resolve. Advised return to clinic if symptoms persist or worsen.

## 2021-07-08 NOTE — Assessment & Plan Note (Signed)
Pelvic exam performed today and swab collected to test for STDs and BV due to gray-white vaginal discharge with fishy odor. Likely BV. Will treat when results return.

## 2021-07-09 ENCOUNTER — Other Ambulatory Visit: Payer: Self-pay | Admitting: Family Medicine

## 2021-07-12 DIAGNOSIS — F4310 Post-traumatic stress disorder, unspecified: Secondary | ICD-10-CM | POA: Diagnosis not present

## 2021-07-12 DIAGNOSIS — F331 Major depressive disorder, recurrent, moderate: Secondary | ICD-10-CM | POA: Diagnosis not present

## 2021-07-12 LAB — CERVICOVAGINAL ANCILLARY ONLY
Bacterial Vaginitis (gardnerella): NEGATIVE
Candida Glabrata: NEGATIVE
Candida Vaginitis: NEGATIVE
Chlamydia: NEGATIVE
Comment: NEGATIVE
Comment: NEGATIVE
Comment: NEGATIVE
Comment: NEGATIVE
Comment: NEGATIVE
Comment: NORMAL
Neisseria Gonorrhea: NEGATIVE
Trichomonas: NEGATIVE

## 2021-07-13 ENCOUNTER — Telehealth: Payer: Self-pay | Admitting: Student

## 2021-07-13 DIAGNOSIS — N760 Acute vaginitis: Secondary | ICD-10-CM

## 2021-07-13 DIAGNOSIS — B9689 Other specified bacterial agents as the cause of diseases classified elsewhere: Secondary | ICD-10-CM

## 2021-07-13 MED ORDER — METRONIDAZOLE 500 MG PO TABS
500.0000 mg | ORAL_TABLET | Freq: Two times a day (BID) | ORAL | 0 refills | Status: AC
Start: 2021-07-13 — End: 2021-07-20

## 2021-07-13 NOTE — Telephone Encounter (Signed)
Discussed with patient.  She has not needed this refill at this time and states pharmacy sent the refill request.  No need to refill at this time.

## 2021-07-13 NOTE — Telephone Encounter (Signed)
Discussed negative results with patient. She is still having discharge.  She states she has been on steroids since breaking her foot but this discharge has been going on since before then.  She continues to state this is similar to all of her previous BV infections and she generally responds to clindamycin or metronidazole.  Advised patient that I would not advise medications given negative result but if she would like to trial medication to attempt to resolve this issue that would be permissible.  Patient requested medication as this is very much something that is affecting her life.Prescribed metronidazole twice daily with meals x7 days sent to her preferred pharmacy [facility] on [address].

## 2021-07-21 DIAGNOSIS — F4310 Post-traumatic stress disorder, unspecified: Secondary | ICD-10-CM | POA: Diagnosis not present

## 2021-07-21 DIAGNOSIS — F331 Major depressive disorder, recurrent, moderate: Secondary | ICD-10-CM | POA: Diagnosis not present

## 2021-07-28 DIAGNOSIS — F331 Major depressive disorder, recurrent, moderate: Secondary | ICD-10-CM | POA: Diagnosis not present

## 2021-07-28 DIAGNOSIS — F4310 Post-traumatic stress disorder, unspecified: Secondary | ICD-10-CM | POA: Diagnosis not present

## 2021-08-03 DIAGNOSIS — F331 Major depressive disorder, recurrent, moderate: Secondary | ICD-10-CM | POA: Diagnosis not present

## 2021-08-03 DIAGNOSIS — F4310 Post-traumatic stress disorder, unspecified: Secondary | ICD-10-CM | POA: Diagnosis not present

## 2021-08-04 ENCOUNTER — Ambulatory Visit: Payer: Medicaid Other | Admitting: Sports Medicine

## 2021-08-04 ENCOUNTER — Encounter: Payer: Self-pay | Admitting: Sports Medicine

## 2021-08-04 ENCOUNTER — Other Ambulatory Visit: Payer: Self-pay

## 2021-08-04 ENCOUNTER — Ambulatory Visit (INDEPENDENT_AMBULATORY_CARE_PROVIDER_SITE_OTHER): Payer: Medicaid Other

## 2021-08-04 DIAGNOSIS — M84374A Stress fracture, right foot, initial encounter for fracture: Secondary | ICD-10-CM

## 2021-08-04 DIAGNOSIS — M79671 Pain in right foot: Secondary | ICD-10-CM | POA: Diagnosis not present

## 2021-08-04 DIAGNOSIS — M722 Plantar fascial fibromatosis: Secondary | ICD-10-CM | POA: Diagnosis not present

## 2021-08-04 DIAGNOSIS — M84374G Stress fracture, right foot, subsequent encounter for fracture with delayed healing: Secondary | ICD-10-CM | POA: Diagnosis not present

## 2021-08-04 NOTE — Progress Notes (Signed)
Subjective: Connie Jenkins is a 40 y.o. female patient who returns to office for evaluation of Right> Left foot pain. Patient complains of continued pain pain is the same states that the surgical shoe does help pain is worse without the shoe and the more that she is on a constant throbbing pain at the medial foot and heel on the right.  Patient does report that surgical shoe helps.  Patient states that she completed the prednisone and thinks that the diclofenac made her sleepy however she does recall taking her allergy medicine around the same time with this medication and is not sure if it is from this medication or her allergy meds.  Patient Active Problem List   Diagnosis Date Noted   BPPV (benign paroxysmal positional vertigo), unspecified laterality 07/08/2021   Vaginal spotting 05/10/2021   Seasonal allergies 03/28/2021   Acute pain of left shoulder 03/28/2021   Pelvic pain 03/28/2021   PTSD (post-traumatic stress disorder) 02/25/2021   Dysuria 02/09/2021   Carpal tunnel syndrome 02/09/2021   Depressed mood 02/09/2021   BV (bacterial vaginosis) 01/26/2021   Encounter for Papanicolaou smear for cervical cancer screening 01/26/2021   Encounter for other contraceptive management 07/18/2017   S/P cesarean section 06/15/2017   Gestational diabetes mellitus (GDM) affecting pregnancy 04/08/2017   HSV (herpes simplex virus) infection 11/13/2016   Migraines 11/13/2016   CONDYLOMA ACUMINATUM 01/24/2007    Current Outpatient Medications on File Prior to Visit  Medication Sig Dispense Refill   prazosin (MINIPRESS) 1 MG capsule Take 1 capsule (1 mg total) by mouth at bedtime. 30 capsule 1   predniSONE (STERAPRED UNI-PAK 21 TAB) 10 MG (21) TBPK tablet Take as directed 21 tablet 0   sertraline (ZOLOFT) 50 MG tablet Take 1 tablet (50 mg total) by mouth daily. 90 tablet 1   valACYclovir (VALTREX) 500 MG tablet Take 1 tablet (500 mg total) by mouth daily. 90 tablet 1   No current  facility-administered medications on file prior to visit.    Allergies  Allergen Reactions   Latex Itching   Penicillins Hives and Other (See Comments)    Childhood allergy Has patient had a PCN reaction causing immediate rash, facial/tongue/throat swelling, SOB or lightheadedness with hypotension: unknown Has patient had a PCN reaction causing severe rash involving mucus membranes or skin necrosis: unknown Has patient had a PCN reaction that required hospitalization unknown Has patient had a PCN reaction occurring within the last 10 years: no If all of the above answers are "NO", then may proceed with Cephalosporin use.      Objective:  General: Alert and oriented x3 in no acute distress  Dermatology: No open lesions bilateral lower extremities, no webspace macerations, no ecchymosis bilateral, all nails x 10 are well manicured.  Vascular: No significant swelling bilateral.. Dorsalis Pedis and Posterior Tibial pedal pulses 2/4, Capillary Fill Time 3 seconds,(+) pedal hair growth bilateral, Temperature gradient within normal limits.  Neurology: Johney Maine sensation intact via light touch bilateral.  Musculoskeletal: There is tenderness with palpation at tarsal metatarsal joint medial aspect at the first metatarsal base and cuneiform area on the right.  There is also pain along the plantar fascial insertions bilateral right greater than left, range of motion limited on the right due to pain.  Strength within normal limits in all groups bilateral.   Gait: Unassisted, Antalgic gait  Xrays  Right Foot   Impression: Questionable avulsion fleck remains at the first metatarsal base/medial cuneiform there is midtarsal breech supportive of pes planus  deformity and unchanged calcaneal spurs present.    Assessment and Plan: Problem List Items Addressed This Visit   None Visit Diagnoses     Stress fracture, right foot, initial encounter for fracture    -  Primary   Relevant Orders   DG Foot  Complete Right   Pain in right foot       Relevant Orders   DG Foot Complete Right   MR FOOT RIGHT WO CONTRAST   Plantar fasciitis, bilateral       R>L   Stress fracture, right foot, subsequent encounter for fracture with delayed healing       Relevant Orders   MR FOOT RIGHT WO CONTRAST        -Complete examination performed -Xrays reviewed again this visit -Re-Discussed treatement options for ?  Avulsion/stress fracture at medial right 1st TMJ and chronic heel pain -Rx MRI to further assess the right foot for fracture versus tendon or ligament injury -Advised patient to continue with diclofenac until completed -Continue with surgical shoe to patient to wear at all times and instructed on use -Recommend protection, rest, ice, elevation daily until symptoms improve -Patient to return to office after MRI or sooner if problems or issues arise Landis Martins, DPM

## 2021-08-11 ENCOUNTER — Telehealth: Payer: Self-pay | Admitting: *Deleted

## 2021-08-11 NOTE — Telephone Encounter (Signed)
Patient called stating boot is cracked and wanted to pick up a new boot.

## 2021-08-15 ENCOUNTER — Telehealth: Payer: Self-pay | Admitting: Sports Medicine

## 2021-08-15 NOTE — Telephone Encounter (Signed)
Patient called the office stating her cam boot has cracked and wants to know if she can come to the office to pick up another boot.

## 2021-08-16 DIAGNOSIS — F331 Major depressive disorder, recurrent, moderate: Secondary | ICD-10-CM | POA: Diagnosis not present

## 2021-08-16 DIAGNOSIS — F4310 Post-traumatic stress disorder, unspecified: Secondary | ICD-10-CM | POA: Diagnosis not present

## 2021-08-20 ENCOUNTER — Other Ambulatory Visit: Payer: Medicaid Other

## 2021-08-24 DIAGNOSIS — F331 Major depressive disorder, recurrent, moderate: Secondary | ICD-10-CM | POA: Diagnosis not present

## 2021-08-24 DIAGNOSIS — F4310 Post-traumatic stress disorder, unspecified: Secondary | ICD-10-CM | POA: Diagnosis not present

## 2021-08-29 ENCOUNTER — Other Ambulatory Visit: Payer: Self-pay

## 2021-08-29 ENCOUNTER — Encounter (HOSPITAL_BASED_OUTPATIENT_CLINIC_OR_DEPARTMENT_OTHER): Payer: Self-pay

## 2021-08-29 ENCOUNTER — Emergency Department (HOSPITAL_BASED_OUTPATIENT_CLINIC_OR_DEPARTMENT_OTHER): Payer: Medicaid Other | Admitting: Radiology

## 2021-08-29 DIAGNOSIS — S99921A Unspecified injury of right foot, initial encounter: Secondary | ICD-10-CM | POA: Diagnosis not present

## 2021-08-29 DIAGNOSIS — W1839XA Other fall on same level, initial encounter: Secondary | ICD-10-CM | POA: Insufficient documentation

## 2021-08-29 DIAGNOSIS — Z5321 Procedure and treatment not carried out due to patient leaving prior to being seen by health care provider: Secondary | ICD-10-CM | POA: Insufficient documentation

## 2021-08-29 DIAGNOSIS — M79671 Pain in right foot: Secondary | ICD-10-CM | POA: Insufficient documentation
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
## 2021-08-30 ENCOUNTER — Emergency Department (HOSPITAL_BASED_OUTPATIENT_CLINIC_OR_DEPARTMENT_OTHER)
Admission: EM | Admit: 2021-08-30 | Discharge: 2021-08-30 | Disposition: A | Discharge status: Home / Self Care | Payer: Medicaid Other | Attending: Emergency Medicine | Admitting: Emergency Medicine

## 2021-08-30 ENCOUNTER — Telehealth: Payer: Self-pay | Admitting: *Deleted

## 2021-08-30 DIAGNOSIS — M79671 Pain in right foot: Secondary | ICD-10-CM

## 2021-08-30 NOTE — ED Notes (Signed)
  Patient walked out before seeing doctor.

## 2021-08-30 NOTE — Telephone Encounter (Signed)
Patient is calling for the status of an MRI that was supposed to be schedule 1 month ago. She has not heard anything and foot is in a lot of pain, went to ER last night, had additional X-rays taken,didn't find anything but has suggested MRI as well.  In process of trying to get prior authorization (healthy blue-Medicaid). [facility] and they said that patient was cancelled because of no authorization but she (Connie Jenkins)is going to work on rescheduling appointment and prior authorization.

## 2021-08-31 ENCOUNTER — Telehealth: Payer: Self-pay

## 2021-08-31 NOTE — Telephone Encounter (Signed)
Transition Care Management Follow-up Telephone Call Date of discharge and from where: 08/30/2021-[facility] How have you been since you were released from the hospital? Patient stated she is doing fine.  Any questions or concerns? No  Items Reviewed: Did the pt receive and understand the discharge instructions provided? Yes  Medications obtained and verified? Yes  Other? No  Any new allergies since your discharge? No  Dietary orders reviewed? No Do you have support at home? Yes   Home Care and Equipment/Supplies: Were home health services ordered? not applicable If so, what is the name of the agency? N/A  Has the agency set up a time to come to the patient's home? not applicable Were any new equipment or medical supplies ordered?  No What is the name of the medical supply agency? N/A Were you able to get the supplies/equipment? not applicable Do you have any questions related to the use of the equipment or supplies? No  Functional Questionnaire: (I = Independent and D = Dependent) ADLs: I  Bathing/Dressing- I  Meal Prep- I  Eating- I  Maintaining continence- I  Transferring/Ambulation- I  Managing Meds- I  Follow up appointments reviewed:  PCP Hospital f/u appt confirmed? No   Specialist Hospital f/u appt confirmed? No   Are transportation arrangements needed? No  If their condition worsens, is the pt aware to call PCP or go to the Emergency Dept.? Yes Was the patient provided with contact information for the PCP's office or ED? Yes Was to pt encouraged to call back with questions or concerns? Yes

## 2021-09-02 DIAGNOSIS — F331 Major depressive disorder, recurrent, moderate: Secondary | ICD-10-CM | POA: Diagnosis not present

## 2021-09-02 DIAGNOSIS — F4310 Post-traumatic stress disorder, unspecified: Secondary | ICD-10-CM | POA: Diagnosis not present

## 2021-09-05 NOTE — Telephone Encounter (Signed)
Patient is calling for the status of MRI  [facility] and they have been to reach out to patient to schedule, will again today. Spoke with patient and they did finally reached her to schedule appt.

## 2021-09-08 DIAGNOSIS — F331 Major depressive disorder, recurrent, moderate: Secondary | ICD-10-CM | POA: Diagnosis not present

## 2021-09-08 DIAGNOSIS — F4310 Post-traumatic stress disorder, unspecified: Secondary | ICD-10-CM | POA: Diagnosis not present

## 2021-09-13 DIAGNOSIS — F331 Major depressive disorder, recurrent, moderate: Secondary | ICD-10-CM | POA: Diagnosis not present

## 2021-09-13 DIAGNOSIS — F4310 Post-traumatic stress disorder, unspecified: Secondary | ICD-10-CM | POA: Diagnosis not present

## 2021-09-17 ENCOUNTER — Ambulatory Visit
Admission: RE | Admit: 2021-09-17 | Discharge: 2021-09-17 | Disposition: A | Discharge status: Home / Self Care | Payer: Medicaid Other | Source: Ambulatory Visit | Attending: Sports Medicine | Admitting: Sports Medicine

## 2021-09-17 ENCOUNTER — Other Ambulatory Visit: Payer: Self-pay

## 2021-09-17 DIAGNOSIS — M79671 Pain in right foot: Secondary | ICD-10-CM

## 2021-09-17 DIAGNOSIS — R600 Localized edema: Secondary | ICD-10-CM | POA: Diagnosis not present

## 2021-09-17 DIAGNOSIS — M67471 Ganglion, right ankle and foot: Secondary | ICD-10-CM | POA: Diagnosis not present

## 2021-09-17 DIAGNOSIS — M722 Plantar fascial fibromatosis: Secondary | ICD-10-CM | POA: Diagnosis not present

## 2021-09-17 DIAGNOSIS — M84374G Stress fracture, right foot, subsequent encounter for fracture with delayed healing: Secondary | ICD-10-CM

## 2021-09-20 ENCOUNTER — Telehealth: Payer: Self-pay | Admitting: *Deleted

## 2021-09-20 DIAGNOSIS — F331 Major depressive disorder, recurrent, moderate: Secondary | ICD-10-CM | POA: Diagnosis not present

## 2021-09-20 DIAGNOSIS — F4310 Post-traumatic stress disorder, unspecified: Secondary | ICD-10-CM | POA: Diagnosis not present

## 2021-09-20 NOTE — Telephone Encounter (Signed)
Patient is calling for her MRI results, has seen them in epic, unable to interpret. Please advise.

## 2021-09-20 NOTE — Telephone Encounter (Signed)
Please schedule appointment for MRI results

## 2021-09-27 ENCOUNTER — Ambulatory Visit (INDEPENDENT_AMBULATORY_CARE_PROVIDER_SITE_OTHER): Payer: Medicaid Other | Admitting: Sports Medicine

## 2021-09-27 ENCOUNTER — Encounter: Payer: Self-pay | Admitting: Sports Medicine

## 2021-09-27 DIAGNOSIS — M79671 Pain in right foot: Secondary | ICD-10-CM

## 2021-09-27 DIAGNOSIS — M6740 Ganglion, unspecified site: Secondary | ICD-10-CM

## 2021-09-27 DIAGNOSIS — M722 Plantar fascial fibromatosis: Secondary | ICD-10-CM

## 2021-09-27 NOTE — Progress Notes (Signed)
Virtual Visit via Telephone Note  I connected with Connie Jenkins on 09/27/21 at  8:00 AM EDT by telephone and verified that I am speaking with the correct person using two identifiers.  Location: Patient: Connie Jenkins home/in care Provider: Landis Martins, DPM [place] office    I discussed the limitations, risks, security and privacy concerns of performing an evaluation and management service by telephone and the availability of in person appointments. I also discussed with the patient that there may be a patient responsible charge related to this service. The patient expressed understanding and agreed to proceed.   History of Present Illness: 40 year old female patient met via telephone visit to discuss MRI results of her right foot.   Observations/Objective: Physical exam unable to be performed due to telephone nature of visit.   Assessment and Plan: Problem List Items Addressed This Visit   None Visit Diagnoses     Pain in right foot    -  Primary   Plantar fasciitis, right       Ganglion cyst          Discussed with patient MRI results which reveals Planter fasciitis involving the proximal central and lateral insertion with a small ganglion cyst at the calcaneocuboid joint  Discussed with patient treatment options for Planter fasciitis and advised her that likely her chronic heel pain is causing her to walk differently which is stressing other parts of her foot which is adding to her pain Discussed treatment options of continuing with conservative care with steroid injection, physical therapy, or surgery with PRP injections States patient has had failed steroid injection in the past recommend at this time for patient to consider physical therapy or surgery Patient reports that she would consider surgery however would like to wait until January I advised patient to make a follow-up appointment for December for [place] to move forward with scheduling surgery meanwhile  advised patient to continue with rest ice elevation and if pain worsens may call office for me to send an anti-inflammatory medicine or tramadol  Follow Up Instructions: Patient to call office for follow-up appointment in December for surgery consult   I discussed the assessment and treatment plan with the patient. The patient was provided an opportunity to ask questions and all were answered. The patient agreed with the plan and demonstrated an understanding of the instructions.   The patient was advised to call back or seek an in-person evaluation if the symptoms worsen or if the condition fails to improve as anticipated.  I provided 8 minutes of non-face-to-face time during this encounter.   Landis Martins, DPM

## 2021-09-28 DIAGNOSIS — F331 Major depressive disorder, recurrent, moderate: Secondary | ICD-10-CM | POA: Diagnosis not present

## 2021-09-28 DIAGNOSIS — F4310 Post-traumatic stress disorder, unspecified: Secondary | ICD-10-CM | POA: Diagnosis not present

## 2021-09-29 ENCOUNTER — Ambulatory Visit: Payer: Medicaid Other

## 2021-10-03 DIAGNOSIS — F4310 Post-traumatic stress disorder, unspecified: Secondary | ICD-10-CM | POA: Diagnosis not present

## 2021-10-03 DIAGNOSIS — F331 Major depressive disorder, recurrent, moderate: Secondary | ICD-10-CM | POA: Diagnosis not present

## 2021-10-06 ENCOUNTER — Ambulatory Visit: Payer: Medicaid Other | Admitting: Sports Medicine

## 2021-10-06 ENCOUNTER — Other Ambulatory Visit: Payer: Self-pay

## 2021-10-06 DIAGNOSIS — M6740 Ganglion, unspecified site: Secondary | ICD-10-CM | POA: Diagnosis not present

## 2021-10-06 DIAGNOSIS — M722 Plantar fascial fibromatosis: Secondary | ICD-10-CM

## 2021-10-06 DIAGNOSIS — M79671 Pain in right foot: Secondary | ICD-10-CM | POA: Diagnosis not present

## 2021-10-06 NOTE — Progress Notes (Signed)
Subjective: Connie Jenkins is a 40 y.o. female patient who returns to office for follow-up evaluation of right foot pain.  Patient reports that she still has pain in the heel and also pain at the lateral side of the foot and ankle.  Patient is here to further discuss surgery as results previously had MRI and we discussed the results last week over a telephone visit.  Patient Active Problem List   Diagnosis Date Noted   BPPV (benign paroxysmal positional vertigo), unspecified laterality 07/08/2021   Vaginal spotting 05/10/2021   Seasonal allergies 03/28/2021   Acute pain of left shoulder 03/28/2021   Pelvic pain 03/28/2021   PTSD (post-traumatic stress disorder) 02/25/2021   Dysuria 02/09/2021   Carpal tunnel syndrome 02/09/2021   Depressed mood 02/09/2021   BV (bacterial vaginosis) 01/26/2021   Encounter for Papanicolaou smear for cervical cancer screening 01/26/2021   Encounter for other contraceptive management 07/18/2017   S/P cesarean section 06/15/2017   Gestational diabetes mellitus (GDM) affecting pregnancy 04/08/2017   HSV (herpes simplex virus) infection 11/13/2016   Migraines 11/13/2016   CONDYLOMA ACUMINATUM 01/24/2007    Current Outpatient Medications on File Prior to Visit  Medication Sig Dispense Refill   prazosin (MINIPRESS) 1 MG capsule Take 1 capsule (1 mg total) by mouth at bedtime. 30 capsule 1   predniSONE (STERAPRED UNI-PAK 21 TAB) 10 MG (21) TBPK tablet Take as directed 21 tablet 0   sertraline (ZOLOFT) 50 MG tablet Take 1 tablet (50 mg total) by mouth daily. 90 tablet 1   valACYclovir (VALTREX) 500 MG tablet Take 1 tablet (500 mg total) by mouth daily. 90 tablet 1   No current facility-administered medications on file prior to visit.    Allergies  Allergen Reactions   Latex Itching   Penicillins Hives and Other (See Comments)    Childhood allergy Has patient had a PCN reaction causing immediate rash, facial/tongue/throat swelling, SOB or lightheadedness  with hypotension: unknown Has patient had a PCN reaction causing severe rash involving mucus membranes or skin necrosis: unknown Has patient had a PCN reaction that required hospitalization unknown Has patient had a PCN reaction occurring within the last 10 years: no If all of the above answers are "NO", then may proceed with Cephalosporin use.      Objective:  General: Alert and oriented x3 in no acute distress  Dermatology: No open lesions bilateral lower extremities, no webspace macerations, no ecchymosis bilateral, all nails x 10 are well manicured.  Vascular: No significant swelling bilateral.. Dorsalis Pedis and Posterior Tibial pedal pulses 2/4, Capillary Fill Time 3 seconds,(+) pedal hair growth bilateral, Temperature gradient within normal limits.  Neurology: Johney Maine sensation intact via light touch bilateral.  Musculoskeletal: There is continued pain along the plantar fascial insertion on the right with some lateral foot pain along the peroneal tendon course likely from compensation.  MRI results as noted in chart    Assessment and Plan: Problem List Items Addressed This Visit   None Visit Diagnoses     Plantar fasciitis, right    -  Primary   Pain in right foot       Ganglion cyst            -Complete examination performed -Previous MRI reviewed  -Discussed with patient treatment options for chronic Planter fasciitis on the right with ganglion cyst -Patient opt for surgical management. Consent obtained for EPF with PRP injection on the right heel.  Pre and Post op course explained. Risks, benefits, alternatives explained.  No guarantees given or implied. Surgical booking slip submitted and provided patient with Surgical packet and info for [facility] but did advise patient based on her insurance we may have to move the case to [facility] or [facility] -To Dispensed CAM boot and crutches at surgical center -Patient aware that she cannot drive until after about 3 weeks -Return  after surgery or sooner if problems or issues arise   Landis Martins, DPM

## 2021-10-10 DIAGNOSIS — F331 Major depressive disorder, recurrent, moderate: Secondary | ICD-10-CM | POA: Diagnosis not present

## 2021-10-10 DIAGNOSIS — F4310 Post-traumatic stress disorder, unspecified: Secondary | ICD-10-CM | POA: Diagnosis not present

## 2021-10-28 DIAGNOSIS — F331 Major depressive disorder, recurrent, moderate: Secondary | ICD-10-CM | POA: Diagnosis not present

## 2021-10-28 DIAGNOSIS — F4310 Post-traumatic stress disorder, unspecified: Secondary | ICD-10-CM | POA: Diagnosis not present

## 2021-11-01 ENCOUNTER — Telehealth: Payer: Self-pay | Admitting: Urology

## 2021-11-01 NOTE — Telephone Encounter (Signed)
DOS - 11/14/21  EPF RIGHT --- 18403 INJECTION RIGHT --- 20550  HEALTHY BLUE EFFECTIVE DATE - 05/27/21   PER [employer] WEBSITE CPT CODES 75436 AND 06770 HAVE BEEN APPROVED, AUTH # [id number], GOOD FROM 11/14/21 - 12/14/21.   TRACKING # [id number]

## 2021-11-03 DIAGNOSIS — F4310 Post-traumatic stress disorder, unspecified: Secondary | ICD-10-CM | POA: Diagnosis not present

## 2021-11-03 DIAGNOSIS — F331 Major depressive disorder, recurrent, moderate: Secondary | ICD-10-CM | POA: Diagnosis not present

## 2021-11-10 DIAGNOSIS — F4310 Post-traumatic stress disorder, unspecified: Secondary | ICD-10-CM | POA: Diagnosis not present

## 2021-11-10 DIAGNOSIS — F331 Major depressive disorder, recurrent, moderate: Secondary | ICD-10-CM | POA: Diagnosis not present

## 2021-11-22 ENCOUNTER — Telehealth: Payer: Self-pay | Admitting: Sports Medicine

## 2021-11-24 ENCOUNTER — Encounter: Payer: Medicaid Other | Admitting: Podiatry

## 2021-11-30 DIAGNOSIS — F331 Major depressive disorder, recurrent, moderate: Secondary | ICD-10-CM | POA: Diagnosis not present

## 2021-11-30 DIAGNOSIS — F4310 Post-traumatic stress disorder, unspecified: Secondary | ICD-10-CM | POA: Diagnosis not present

## 2021-12-01 ENCOUNTER — Encounter: Payer: Medicaid Other | Admitting: Sports Medicine

## 2021-12-04 DIAGNOSIS — B3731 Acute candidiasis of vulva and vagina: Secondary | ICD-10-CM | POA: Diagnosis not present

## 2021-12-04 DIAGNOSIS — R102 Pelvic and perineal pain: Secondary | ICD-10-CM | POA: Diagnosis not present

## 2021-12-04 DIAGNOSIS — Z3202 Encounter for pregnancy test, result negative: Secondary | ICD-10-CM | POA: Diagnosis not present

## 2021-12-04 DIAGNOSIS — N390 Urinary tract infection, site not specified: Secondary | ICD-10-CM | POA: Diagnosis not present

## 2021-12-08 ENCOUNTER — Ambulatory Visit: Payer: Medicaid Other | Admitting: Sports Medicine

## 2021-12-08 ENCOUNTER — Other Ambulatory Visit: Payer: Self-pay

## 2021-12-08 ENCOUNTER — Encounter: Payer: Self-pay | Admitting: Sports Medicine

## 2021-12-08 DIAGNOSIS — M722 Plantar fascial fibromatosis: Secondary | ICD-10-CM | POA: Diagnosis not present

## 2021-12-08 DIAGNOSIS — F331 Major depressive disorder, recurrent, moderate: Secondary | ICD-10-CM | POA: Diagnosis not present

## 2021-12-08 DIAGNOSIS — F4310 Post-traumatic stress disorder, unspecified: Secondary | ICD-10-CM | POA: Diagnosis not present

## 2021-12-08 DIAGNOSIS — M79671 Pain in right foot: Secondary | ICD-10-CM

## 2021-12-08 MED ORDER — MELOXICAM 15 MG PO TABS: 15.0000 mg | ORAL_TABLET | Freq: Every day | ORAL | 0 refills | Status: DC

## 2021-12-08 MED ORDER — TRIAMCINOLONE ACETONIDE 10 MG/ML IJ SUSP
10.0000 mg | Freq: Once | INTRAMUSCULAR | Status: AC
  Administered 2021-12-08: 10 mg

## 2021-12-08 MED ORDER — PREDNISONE 10 MG (21) PO TBPK: ORAL_TABLET | ORAL | 0 refills | Status: DC

## 2021-12-08 NOTE — Progress Notes (Signed)
Subjective: Connie Jenkins is a 41 y.o. female patient who returns to office for follow-up evaluation of right foot pain.  Patient reports that she canceled her surgery because she has too much going on right now and wants to know what other options she has to treat her right heel pain states that pain on average is 9 out of 10.  Patient reports that she has been stretching icing and has started wearing tennis shoes more which seems to help.  Patient Active Problem List   Diagnosis Date Noted   BPPV (benign paroxysmal positional vertigo), unspecified laterality 07/08/2021   Vaginal spotting 05/10/2021   Seasonal allergies 03/28/2021   Acute pain of left shoulder 03/28/2021   Pelvic pain 03/28/2021   PTSD (post-traumatic stress disorder) 02/25/2021   Dysuria 02/09/2021   Carpal tunnel syndrome 02/09/2021   Depressed mood 02/09/2021   BV (bacterial vaginosis) 01/26/2021   Encounter for Papanicolaou smear for cervical cancer screening 01/26/2021   Encounter for other contraceptive management 07/18/2017   S/P cesarean section 06/15/2017   Gestational diabetes mellitus (GDM) affecting pregnancy 04/08/2017   HSV (herpes simplex virus) infection 11/13/2016   Migraines 11/13/2016   CONDYLOMA ACUMINATUM 01/24/2007    Current Outpatient Medications on File Prior to Visit  Medication Sig Dispense Refill   prazosin (MINIPRESS) 1 MG capsule Take 1 capsule (1 mg total) by mouth at bedtime. 30 capsule 1   sertraline (ZOLOFT) 50 MG tablet Take 1 tablet (50 mg total) by mouth daily. 90 tablet 1   valACYclovir (VALTREX) 500 MG tablet Take 1 tablet (500 mg total) by mouth daily. 90 tablet 1   No current facility-administered medications on file prior to visit.    Allergies  Allergen Reactions   Latex Itching   Penicillins Hives and Other (See Comments)    Childhood allergy Has patient had a PCN reaction causing immediate rash, facial/tongue/throat swelling, SOB or lightheadedness with hypotension:  unknown Has patient had a PCN reaction causing severe rash involving mucus membranes or skin necrosis: unknown Has patient had a PCN reaction that required hospitalization unknown Has patient had a PCN reaction occurring within the last 10 years: no If all of the above answers are "NO", then may proceed with Cephalosporin use.      Objective:  General: Alert and oriented x3 in no acute distress  Dermatology: No open lesions bilateral lower extremities, no webspace macerations, no ecchymosis bilateral, all nails x 10 are well manicured.  Vascular: No significant swelling bilateral.. Dorsalis Pedis and Posterior Tibial pedal pulses 2/4, Capillary Fill Time 3 seconds,(+) pedal hair growth bilateral, Temperature gradient within normal limits.  Neurology: Johney Maine sensation intact via light touch bilateral.  Musculoskeletal: There is continued pain along the plantar fascial insertion on the right.  No reproducible pain at this visit along the peroneal tendon course.  Range of motion appears to be mildly guarded on the right due to pain.  Assessment and Plan: Problem List Items Addressed This Visit   None Visit Diagnoses     Plantar fasciitis, right    -  Primary   Relevant Medications   triamcinolone acetonide (KENALOG) 10 MG/ML injection 10 mg (Start on 12/08/2021  1:15 PM)   Pain in right foot             -Complete examination performed -Re-Discussed with patient treatment options for chronic Planter fasciitis on the right with ganglion cyst -Patient wants to hold off on surgery and I did discuss with her trying shockwave/EPAT therapy  of which she wants to consider -After oral consent and aseptic prep, injected a mixture containing 1 ml of 2%  plain lidocaine, 1 ml 0.5% plain marcaine, 0.5 ml of kenalog 10 and 0.5 ml of dexamethasone phosphate into right heel at area of maximum pain at the plantar fascial insertion medial approach without complication. Post-injection care discussed  with patient.  -Prescribed prednisone and meloxicam to take as directed -Advised icing and gentle stretching as previously directed -Return to office if symptoms fail to improve or when she has made a decision about EPAT/shockwave therapy   Landis Martins, DPM

## 2021-12-15 ENCOUNTER — Encounter: Payer: Medicaid Other | Admitting: Sports Medicine

## 2021-12-16 DIAGNOSIS — F331 Major depressive disorder, recurrent, moderate: Secondary | ICD-10-CM | POA: Diagnosis not present

## 2021-12-16 DIAGNOSIS — F4310 Post-traumatic stress disorder, unspecified: Secondary | ICD-10-CM | POA: Diagnosis not present

## 2021-12-22 ENCOUNTER — Encounter: Payer: Medicaid Other | Admitting: Podiatry

## 2021-12-23 DIAGNOSIS — F4310 Post-traumatic stress disorder, unspecified: Secondary | ICD-10-CM | POA: Diagnosis not present

## 2021-12-23 DIAGNOSIS — F331 Major depressive disorder, recurrent, moderate: Secondary | ICD-10-CM | POA: Diagnosis not present

## 2021-12-24 DIAGNOSIS — Z20822 Contact with and (suspected) exposure to covid-19: Secondary | ICD-10-CM | POA: Diagnosis not present

## 2021-12-28 DIAGNOSIS — F331 Major depressive disorder, recurrent, moderate: Secondary | ICD-10-CM | POA: Diagnosis not present

## 2021-12-28 DIAGNOSIS — F4310 Post-traumatic stress disorder, unspecified: Secondary | ICD-10-CM | POA: Diagnosis not present

## 2021-12-29 NOTE — Progress Notes (Signed)
° ° °  SUBJECTIVE:   CHIEF COMPLAINT / HPI:   Pap smear: reviewed pap smear history- pap done in 2/22 where transformation zone was absent, but HPV negative. Per [facility], next pap not due until 2027.  Right abdominal pain: patient reports 1-2 weeks of right sided flank pain that radiates to right groin. She notes subjective fevers, chills, dysuria, and vaginal discharge that has stained her underwear like bleach after she washes it. She was seen in an urgent care on Sunday, treated with mobic for suspected UTI. UA showed hematuria, neg nitrites, neg leuks, urine culture with 25-50k of mixed urogenital flora- so very unlikely to have had UTI. Patient completed mobic without resolution of symptoms. She denies n/v/d, rash, swelling, runny nose, cough, etc. Patietn has a history of nephrolithiasis and reports her symptoms feel somewhat like what she previously experienced.   PERTINENT  PMH / PSH: h/o nephrolithiasis  OBJECTIVE:   Ht 5\' 3"  (1.6 m)    Wt 236 lb (107 kg)    BMI 41.81 kg/m  P 83, O2 98, BP 117/78 Nursing note and vitals reviewed GEN: age appropriate AAW, resting comfortably in chair, NAD, class III obesity HEENT: NCAT. PERRLA. Sclera without injection or icterus. MMM.  Neck: Supple. No LAD Cardiac: Regular rate and rhythm. Normal S1/S2. No murmurs, rubs, or gallops appreciated. 2+ radial pulses. Lungs: Clear bilaterally to ascultation. No increased WOB, no accessory muscle usage. No w/r/r. Abdomen: Normoactive bowel sounds. Mild TTP in R flank. No rebound or guarding. No CVA tenderness Pelvic: Normal appearing external female genitalia, normal vaginal epithelium, minimal amount of white, thin discharge. Normal appearing cervix. No CMT. Neuro: AOx3  Ext: no edema Psych: Pleasant and appropriate   ASSESSMENT/PLAN:   Flank pain Broad ddx, but as pt has hematuria on UA today (neg leuks and nitrites), nephrolithiasis is highest on ddx. 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 Appendicitis is lower on ddx given benign abdominal exam, however will obtain CBC and BMP to assess for bacterial infection, renal function. Tried to obtain stat renal stone CT, however insurance denied imaging. Peer to peer completed and pt must have renal US first, ordered. Recommend close follow up and strict return precautions.     Gladys Damme, MD [place]

## 2021-12-30 ENCOUNTER — Encounter: Payer: Self-pay | Admitting: Family Medicine

## 2021-12-30 ENCOUNTER — Ambulatory Visit: Payer: Medicaid Other | Admitting: Family Medicine

## 2021-12-30 ENCOUNTER — Other Ambulatory Visit: Payer: Self-pay

## 2021-12-30 ENCOUNTER — Other Ambulatory Visit (HOSPITAL_COMMUNITY)
Admission: RE | Admit: 2021-12-30 | Discharge: 2021-12-30 | Disposition: A | Discharge status: Home / Self Care | Payer: Medicaid Other | Source: Ambulatory Visit | Attending: Family Medicine | Admitting: Family Medicine

## 2021-12-30 VITALS — Ht 63.0 in | Wt 236.0 lb

## 2021-12-30 DIAGNOSIS — N898 Other specified noninflammatory disorders of vagina: Secondary | ICD-10-CM | POA: Insufficient documentation

## 2021-12-30 DIAGNOSIS — R300 Dysuria: Secondary | ICD-10-CM

## 2021-12-30 DIAGNOSIS — Z23 Encounter for immunization: Secondary | ICD-10-CM | POA: Diagnosis not present

## 2021-12-30 DIAGNOSIS — Z113 Encounter for screening for infections with a predominantly sexual mode of transmission: Secondary | ICD-10-CM

## 2021-12-30 DIAGNOSIS — R109 Unspecified abdominal pain: Secondary | ICD-10-CM | POA: Diagnosis not present

## 2021-12-30 DIAGNOSIS — Z114 Encounter for screening for human immunodeficiency virus [HIV]: Secondary | ICD-10-CM | POA: Diagnosis not present

## 2021-12-30 DIAGNOSIS — R3129 Other microscopic hematuria: Secondary | ICD-10-CM

## 2021-12-30 DIAGNOSIS — R1031 Right lower quadrant pain: Secondary | ICD-10-CM

## 2021-12-30 LAB — POCT WET PREP (WET MOUNT)
Clue Cells Wet Prep Whiff POC: NEGATIVE
Trichomonas Wet Prep HPF POC: ABSENT

## 2021-12-30 LAB — POCT URINALYSIS DIP (MANUAL ENTRY)
Bilirubin, UA: NEGATIVE
Glucose, UA: NEGATIVE mg/dL
Ketones, POC UA: NEGATIVE mg/dL
Leukocytes, UA: NEGATIVE
Nitrite, UA: NEGATIVE
Protein Ur, POC: NEGATIVE mg/dL
Spec Grav, UA: 1.025 (ref 1.010–1.025)
Urobilinogen, UA: 1 E.U./dL
pH, UA: 6 (ref 5.0–8.0)

## 2021-12-30 LAB — POCT UA - MICROSCOPIC ONLY: Epithelial cells, urine per micros: >20

## 2021-12-30 LAB — POCT URINE PREGNANCY: Preg Test, Ur: NEGATIVE

## 2021-12-30 NOTE — Patient Instructions (Signed)
It was a pleasure to see you today!  We will get some labs today.  If they are abnormal or we need to do something about them, I will call you.  If they are normal, I will send you a message on MyChart (if it is active) or a letter in the mail.  If you don't hear from [place] in 2 weeks, please call the office  [phone]. We will get CT for stones and we will let you know the appointment. I will let you know the results as soon as they come to me    Be Well,  Dr. Chauncey Reading

## 2021-12-31 DIAGNOSIS — R109 Unspecified abdominal pain: Secondary | ICD-10-CM

## 2021-12-31 HISTORY — DX: Unspecified abdominal pain: R10.9

## 2021-12-31 LAB — CBC WITH DIFFERENTIAL/PLATELET
Basophils Absolute: 0 10*3/uL (ref 0.0–0.2)
Basos: 0 %
EOS (ABSOLUTE): 0.2 10*3/uL (ref 0.0–0.4)
Eos: 4 %
Hematocrit: 38.6 % (ref 34.0–46.6)
Hemoglobin: 12.4 g/dL (ref 11.1–15.9)
Immature Grans (Abs): 0 10*3/uL (ref 0.0–0.1)
Immature Granulocytes: 0 %
Lymphocytes Absolute: 1.2 10*3/uL (ref 0.7–3.1)
Lymphs: 21 %
MCH: 26.8 pg (ref 26.6–33.0)
MCHC: 32.1 g/dL (ref 31.5–35.7)
MCV: 83 fL (ref 79–97)
Monocytes Absolute: 0.4 10*3/uL (ref 0.1–0.9)
Monocytes: 7 %
Neutrophils Absolute: 3.7 10*3/uL (ref 1.4–7.0)
Neutrophils: 68 %
Platelets: 307 10*3/uL (ref 150–450)
RBC: 4.63 x10E6/uL (ref 3.77–5.28)
RDW: 12.8 % (ref 11.7–15.4)
WBC: 5.5 10*3/uL (ref 3.4–10.8)

## 2021-12-31 LAB — BASIC METABOLIC PANEL
BUN/Creatinine Ratio: 14 (ref 9–23)
BUN: 12 mg/dL (ref 6–20)
CO2: 23 mmol/L (ref 20–29)
Calcium: 9.3 mg/dL (ref 8.7–10.2)
Chloride: 103 mmol/L (ref 96–106)
Creatinine, Ser: 0.83 mg/dL (ref 0.57–1.00)
Glucose: 89 mg/dL (ref 70–99)
Potassium: 4.4 mmol/L (ref 3.5–5.2)
Sodium: 141 mmol/L (ref 134–144)
eGFR: 92 mL/min/{1.73_m2} (ref >59)

## 2021-12-31 LAB — RPR: RPR Ser Ql: NONREACTIVE

## 2021-12-31 LAB — HIV ANTIBODY (ROUTINE TESTING W REFLEX): HIV Screen 4th Generation wRfx: NONREACTIVE

## 2021-12-31 NOTE — Assessment & Plan Note (Signed)
Broad ddx, but as pt has hematuria on UA today (neg leuks and nitrites), nephrolithiasis is highest on ddx. Also PID is consideration (h/o HSV, condylomata acuminata), will obtain wet prep, GC, HIV, RPR today. Appendicitis is lower on ddx given benign abdominal exam, however will obtain CBC and BMP to assess for bacterial infection, renal function. Tried to obtain stat renal stone CT, however insurance denied imaging. Peer to peer completed and pt must have renal US first, ordered. Recommend close follow up and strict return precautions.

## 2022-01-02 ENCOUNTER — Ambulatory Visit (HOSPITAL_COMMUNITY)
Admission: RE | Admit: 2022-01-02 | Discharge: 2022-01-02 | Disposition: A | Discharge status: Home / Self Care | Payer: Medicaid Other | Source: Ambulatory Visit | Attending: Family Medicine | Admitting: Family Medicine

## 2022-01-02 ENCOUNTER — Other Ambulatory Visit: Payer: Self-pay | Admitting: Family Medicine

## 2022-01-02 ENCOUNTER — Other Ambulatory Visit: Payer: Self-pay

## 2022-01-02 DIAGNOSIS — R109 Unspecified abdominal pain: Secondary | ICD-10-CM | POA: Insufficient documentation

## 2022-01-02 DIAGNOSIS — N200 Calculus of kidney: Secondary | ICD-10-CM | POA: Diagnosis not present

## 2022-01-02 DIAGNOSIS — R3129 Other microscopic hematuria: Secondary | ICD-10-CM | POA: Insufficient documentation

## 2022-01-02 DIAGNOSIS — N210 Calculus in bladder: Secondary | ICD-10-CM | POA: Diagnosis not present

## 2022-01-02 LAB — CERVICOVAGINAL ANCILLARY ONLY
Chlamydia: NEGATIVE
Comment: NEGATIVE
Comment: NORMAL
Neisseria Gonorrhea: NEGATIVE

## 2022-01-02 NOTE — Progress Notes (Signed)
Patient needs Renal US ASAP, insurance would not authorize renal stone study for possible nephrolithiasis without renal US first. She has colicky flank pain and hematuria with history of nephrolithiasis.  Gladys Damme, MD [place] [facility], PGY-3

## 2022-01-03 ENCOUNTER — Encounter (HOSPITAL_COMMUNITY): Payer: Self-pay

## 2022-01-03 ENCOUNTER — Other Ambulatory Visit: Payer: Self-pay | Admitting: Family Medicine

## 2022-01-03 ENCOUNTER — Other Ambulatory Visit: Payer: Self-pay

## 2022-01-03 ENCOUNTER — Ambulatory Visit (HOSPITAL_COMMUNITY)
Admission: RE | Admit: 2022-01-03 | Discharge: 2022-01-03 | Disposition: A | Discharge status: Home / Self Care | Payer: Medicaid Other | Source: Ambulatory Visit | Attending: Family Medicine | Admitting: Family Medicine

## 2022-01-03 ENCOUNTER — Ambulatory Visit (INDEPENDENT_AMBULATORY_CARE_PROVIDER_SITE_OTHER): Payer: Medicaid Other | Admitting: Family Medicine

## 2022-01-03 VITALS — BP 112/74 | HR 73 | Ht 63.0 in | Wt 235.2 lb

## 2022-01-03 DIAGNOSIS — N329 Bladder disorder, unspecified: Secondary | ICD-10-CM | POA: Diagnosis not present

## 2022-01-03 DIAGNOSIS — N200 Calculus of kidney: Secondary | ICD-10-CM | POA: Diagnosis not present

## 2022-01-03 DIAGNOSIS — F4310 Post-traumatic stress disorder, unspecified: Secondary | ICD-10-CM | POA: Diagnosis not present

## 2022-01-03 DIAGNOSIS — F331 Major depressive disorder, recurrent, moderate: Secondary | ICD-10-CM | POA: Diagnosis not present

## 2022-01-03 HISTORY — DX: Calculus of kidney: N20.0

## 2022-01-03 MED ORDER — PHENAZOPYRIDINE HCL 200 MG PO TABS: 200.0000 mg | ORAL_TABLET | Freq: Three times a day (TID) | ORAL | 0 refills | Status: DC | PRN

## 2022-01-03 MED ORDER — OXYCODONE HCL 5 MG PO TABS: 5.0000 mg | ORAL_TABLET | Freq: Four times a day (QID) | ORAL | 0 refills | Status: AC | PRN

## 2022-01-03 MED ORDER — KETOROLAC TROMETHAMINE 15 MG/ML IJ SOLN
15.0000 mg | Freq: Once | INTRAMUSCULAR | Status: AC
  Administered 2022-01-03: 15 mg via INTRAVENOUS

## 2022-01-03 NOTE — Assessment & Plan Note (Signed)
Renal US showed nephrolithiasis and possible bladder calculus vs lesion, radiology recommended CT for clarification. Patient has h/o nephrolithiasis and had to have stent placement last time. Will order stat CT. Renal function WNL on labs 2/3. Treated with toradol 15 mg IM once here for pain, see tx plan below. - pyridium 200 mg TID PRN (counseled on side effects of orange bodily fluids) - oxycodone 5 mg q6h prn for pain, #12 - can use ibuprofen 800 mg q6h prn pain, do not use for more than 5 days in a row - CT a/p with contrast as above

## 2022-01-03 NOTE — Progress Notes (Signed)
° ° °  SUBJECTIVE:   CHIEF COMPLAINT / HPI:   Nephrolithiasis: patient reports she is still having 5/46 colicky R flank pain radiating down to suprapubic area as well as dysuria. Renal US from yesterday demonstrated nephrolithiasis with possible bladder calculi vs calcified bladder lesion. Radiology recommended CT of bladder for follow up. Patient had no e/o UTI on labs on 2/3, treated with macrobid with no resolution and had urine culture last week with normal flora 25-50K CFUs. No fever, no chills.  PERTINENT  PMH / PSH: nephrolithiasis  OBJECTIVE:   BP 112/74    Pulse 73    Ht 5\' 3"  (1.6 m)    Wt 235 lb 4 oz (106.7 kg)    SpO2 98%    BMI 41.67 kg/m   Nursing note and vitals reviewed GEN: age-appropriate AAW, resting comfortably in chair, NAD, WNWD HEENT: NCAT. PERRLA. Sclera without injection or icterus. MMM.  Cardiac: Regular rate and rhythm. Normal S1/S2. No murmurs, rubs, or gallops appreciated. 2+ radial pulses. Abdomen: CVA tenderness on R. Soft, NT, ND. Normoactive bowel sounds. Neuro: AOx3  Ext: no edema Psych: Pleasant and appropriate   ASSESSMENT/PLAN:   Nephrolithiasis Renal US showed nephrolithiasis and possible bladder calculus vs lesion, radiology recommended CT for clarification. Patient has h/o nephrolithiasis and had to have stent placement last time. Will order stat CT. Renal function WNL on labs 2/3. Treated with toradol 15 mg IM once here for pain, see tx plan below. - pyridium 200 mg TID PRN (counseled on side effects of orange bodily fluids) - oxycodone 5 mg q6h prn for pain, #12 - can use ibuprofen 800 mg q6h prn pain, do not use for more than 5 days in a row - CT a/p with contrast as above     Gladys Damme, MD [place]

## 2022-01-03 NOTE — Progress Notes (Signed)
Please see note from today.  Gladys Damme, MD [place] [facility], PGY-3

## 2022-01-03 NOTE — Patient Instructions (Addendum)
It was a pleasure to see you today!  You received toradol injection today for your pain You have kidney stones and possibly a stone in your bladder that needs to be evaluated by CT. I have put this order in and we will try to get it done as soon as possible For pain you can use ibuprofen 800 mg every 6 hours for pain, do not use longer than 5 days in a row You may also use oxycodone 5 mg up to every 6 hours per day for break through pain For burning with urination, I recommend pyridium. You may use this up to 3 times per day. You may have orange urine with this, so do not be alarmed If your pain is uncontrollable, please go to the Emergency Dept    Be Well,  Dr. Chauncey Reading

## 2022-01-04 ENCOUNTER — Telehealth: Payer: Self-pay | Admitting: Family Medicine

## 2022-01-04 ENCOUNTER — Other Ambulatory Visit: Payer: Self-pay | Admitting: Family Medicine

## 2022-01-04 DIAGNOSIS — N200 Calculus of kidney: Secondary | ICD-10-CM

## 2022-01-04 NOTE — Telephone Encounter (Signed)
I was able to get pre-authorization for patient's CT tomorrow [id number]. Patient's appt for CT a/p w/wo contrast for 2/9 at 11 AM at [facility] was canceled due to no authorization, I was able to get this rescheduled for the same time. I let the patient know to go to appt as planned tomorrow. Will f/u after results.  Gladys Damme, MD [place] [facility], PGY-3

## 2022-01-04 NOTE — Progress Notes (Signed)
Patient will need urgent referral to urology for nephrolithiasis and possible bladder lesion found on ultrasound. Awaiting CT a/p scheduled for tomorrow. Patient has history of nephrolithiasis requiring stent placement.  Gladys Damme, MD [place] [facility], PGY-3

## 2022-01-05 ENCOUNTER — Encounter (HOSPITAL_COMMUNITY): Payer: Self-pay

## 2022-01-05 ENCOUNTER — Ambulatory Visit (HOSPITAL_COMMUNITY)
Admission: RE | Admit: 2022-01-05 | Discharge: 2022-01-05 | Disposition: A | Discharge status: Home / Self Care | Payer: Medicaid Other | Source: Ambulatory Visit | Attending: Family Medicine | Admitting: Family Medicine

## 2022-01-05 ENCOUNTER — Encounter: Payer: Medicaid Other | Admitting: Sports Medicine

## 2022-01-05 ENCOUNTER — Ambulatory Visit (HOSPITAL_COMMUNITY): Payer: Medicaid Other

## 2022-01-05 ENCOUNTER — Other Ambulatory Visit: Payer: Self-pay

## 2022-01-05 DIAGNOSIS — N329 Bladder disorder, unspecified: Secondary | ICD-10-CM | POA: Diagnosis not present

## 2022-01-05 DIAGNOSIS — N200 Calculus of kidney: Secondary | ICD-10-CM | POA: Insufficient documentation

## 2022-01-05 DIAGNOSIS — R319 Hematuria, unspecified: Secondary | ICD-10-CM | POA: Diagnosis not present

## 2022-01-05 DIAGNOSIS — I700 Atherosclerosis of aorta: Secondary | ICD-10-CM | POA: Diagnosis not present

## 2022-01-05 DIAGNOSIS — R109 Unspecified abdominal pain: Secondary | ICD-10-CM | POA: Diagnosis not present

## 2022-01-05 MED ORDER — SODIUM CHLORIDE (PF) 0.9 % IJ SOLN
INTRAMUSCULAR | Status: AC
  Filled 2022-01-05: qty 50

## 2022-01-05 MED ORDER — IOHEXOL 300 MG/ML  SOLN
150.0000 mL | Freq: Once | INTRAMUSCULAR | Status: AC | PRN
  Administered 2022-01-05: 150 mL via INTRAVENOUS

## 2022-01-05 MED ORDER — SODIUM CHLORIDE 0.9 % IV SOLN
INTRAVENOUS | Status: AC
  Filled 2022-01-05: qty 250

## 2022-01-06 ENCOUNTER — Telehealth: Payer: Self-pay | Admitting: Family Medicine

## 2022-01-06 DIAGNOSIS — N200 Calculus of kidney: Secondary | ICD-10-CM

## 2022-01-06 MED ORDER — TAMSULOSIN HCL 0.4 MG PO CAPS: 0.4000 mg | ORAL_CAPSULE | Freq: Every day | ORAL | 3 refills | Status: DC

## 2022-01-06 NOTE — Telephone Encounter (Signed)
Called and discussed CT results. Given that stone is 31mm in size and at R UPJ, expect it to pass on its own. Will tx with tamsulosin. Patient has enough pain medication for the weekend. Discussed strict return precautions in case of pain, n/v, to go to the emergency room over the weekend. Strain urine to look for stone. If no improvement by end of next week, refer to urology.  Gladys Damme, MD [place] [facility], PGY-3

## 2022-01-11 ENCOUNTER — Ambulatory Visit (INDEPENDENT_AMBULATORY_CARE_PROVIDER_SITE_OTHER): Payer: Medicaid Other

## 2022-01-11 ENCOUNTER — Other Ambulatory Visit: Payer: Self-pay

## 2022-01-11 DIAGNOSIS — Z111 Encounter for screening for respiratory tuberculosis: Secondary | ICD-10-CM

## 2022-01-11 NOTE — Progress Notes (Signed)
Patient is here for a PPD placement.  PPD placed in left forearm @ 1015 am.  Patient will return 01/13/2022 to have PPD read.   Talbot Grumbling, RN

## 2022-01-12 DIAGNOSIS — F331 Major depressive disorder, recurrent, moderate: Secondary | ICD-10-CM | POA: Diagnosis not present

## 2022-01-12 DIAGNOSIS — F4310 Post-traumatic stress disorder, unspecified: Secondary | ICD-10-CM | POA: Diagnosis not present

## 2022-01-13 ENCOUNTER — Ambulatory Visit (INDEPENDENT_AMBULATORY_CARE_PROVIDER_SITE_OTHER): Payer: Medicaid Other

## 2022-01-13 ENCOUNTER — Other Ambulatory Visit: Payer: Self-pay

## 2022-01-13 ENCOUNTER — Ambulatory Visit (HOSPITAL_COMMUNITY): Payer: Medicaid Other

## 2022-01-13 DIAGNOSIS — Z111 Encounter for screening for respiratory tuberculosis: Secondary | ICD-10-CM

## 2022-01-13 LAB — TB SKIN TEST
Induration: 0 mm
TB Skin Test: NEGATIVE

## 2022-01-13 NOTE — Progress Notes (Signed)
Patient is here for a PPD read.  It was placed on 01/11/2022 in the left forearm @ 1015 am.    PPD RESULTS:  Result: negative Induration: 0 mm  Letter created and given to patient for documentation purposes. Talbot Grumbling, RN

## 2022-01-17 DIAGNOSIS — F331 Major depressive disorder, recurrent, moderate: Secondary | ICD-10-CM | POA: Diagnosis not present

## 2022-01-17 DIAGNOSIS — F4310 Post-traumatic stress disorder, unspecified: Secondary | ICD-10-CM | POA: Diagnosis not present

## 2022-01-24 DIAGNOSIS — F4310 Post-traumatic stress disorder, unspecified: Secondary | ICD-10-CM | POA: Diagnosis not present

## 2022-01-24 DIAGNOSIS — F331 Major depressive disorder, recurrent, moderate: Secondary | ICD-10-CM | POA: Diagnosis not present

## 2022-02-01 DIAGNOSIS — F331 Major depressive disorder, recurrent, moderate: Secondary | ICD-10-CM | POA: Diagnosis not present

## 2022-02-01 DIAGNOSIS — F4310 Post-traumatic stress disorder, unspecified: Secondary | ICD-10-CM | POA: Diagnosis not present

## 2022-02-08 DIAGNOSIS — F4310 Post-traumatic stress disorder, unspecified: Secondary | ICD-10-CM | POA: Diagnosis not present

## 2022-02-08 DIAGNOSIS — F331 Major depressive disorder, recurrent, moderate: Secondary | ICD-10-CM | POA: Diagnosis not present

## 2022-02-15 DIAGNOSIS — F4310 Post-traumatic stress disorder, unspecified: Secondary | ICD-10-CM | POA: Diagnosis not present

## 2022-02-15 DIAGNOSIS — F331 Major depressive disorder, recurrent, moderate: Secondary | ICD-10-CM | POA: Diagnosis not present

## 2022-02-22 DIAGNOSIS — F331 Major depressive disorder, recurrent, moderate: Secondary | ICD-10-CM | POA: Diagnosis not present

## 2022-02-22 DIAGNOSIS — F4310 Post-traumatic stress disorder, unspecified: Secondary | ICD-10-CM | POA: Diagnosis not present

## 2022-02-23 ENCOUNTER — Ambulatory Visit: Payer: Medicaid Other | Admitting: Sports Medicine

## 2022-02-23 ENCOUNTER — Encounter: Payer: Self-pay | Admitting: Sports Medicine

## 2022-02-23 DIAGNOSIS — M722 Plantar fascial fibromatosis: Secondary | ICD-10-CM | POA: Diagnosis not present

## 2022-02-23 DIAGNOSIS — M79672 Pain in left foot: Secondary | ICD-10-CM

## 2022-02-23 DIAGNOSIS — M79671 Pain in right foot: Secondary | ICD-10-CM

## 2022-02-23 MED ORDER — MELOXICAM 15 MG PO TABS: 15.0000 mg | ORAL_TABLET | Freq: Every day | ORAL | 1 refills | Status: DC

## 2022-02-23 MED ORDER — TRIAMCINOLONE ACETONIDE 10 MG/ML IJ SUSP
10.0000 mg | Freq: Once | INTRAMUSCULAR | Status: AC
  Administered 2022-02-23: 10 mg

## 2022-02-23 NOTE — Progress Notes (Signed)
Subjective: ?Connie Jenkins is a 41 y.o. female patient who returns to office for follow-up evaluation of right and left foot pain at the heels.  Patient reports that the last injection did help however at this time has pain and cannot go forward with surgery or for EPAT because she has 2 children that will be graduating and is currently in school.  Patient denies any other pedal complaints at this time. ? ?Patient Active Problem List  ? Diagnosis Date Noted  ? Nephrolithiasis 01/03/2022  ? Flank pain 12/31/2021  ? BPPV (benign paroxysmal positional vertigo), unspecified laterality 07/08/2021  ? Vaginal spotting 05/10/2021  ? Seasonal allergies 03/28/2021  ? Acute pain of left shoulder 03/28/2021  ? Pelvic pain 03/28/2021  ? PTSD (post-traumatic stress disorder) 02/25/2021  ? Dysuria 02/09/2021  ? Carpal tunnel syndrome 02/09/2021  ? Depressed mood 02/09/2021  ? BV (bacterial vaginosis) 01/26/2021  ? Encounter for Papanicolaou smear for cervical cancer screening 01/26/2021  ? Encounter for other contraceptive management 07/18/2017  ? S/P cesarean section 06/15/2017  ? Gestational diabetes mellitus (GDM) affecting pregnancy 04/08/2017  ? HSV (herpes simplex virus) infection 11/13/2016  ? Migraines 11/13/2016  ? CONDYLOMA ACUMINATUM 01/24/2007  ? ? ?Current Outpatient Medications on File Prior to Visit  ?Medication Sig Dispense Refill  ? phenazopyridine (PYRIDIUM) 200 MG tablet Take 1 tablet (200 mg total) by mouth 3 (three) times daily as needed for pain. 30 tablet 0  ? predniSONE (STERAPRED UNI-PAK 21 TAB) 10 MG (21) TBPK tablet Take as directed 21 tablet 0  ? sertraline (ZOLOFT) 50 MG tablet Take 1 tablet (50 mg total) by mouth daily. 90 tablet 1  ? tamsulosin (FLOMAX) 0.4 MG CAPS capsule Take 1 capsule (0.4 mg total) by mouth daily. 30 capsule 3  ? valACYclovir (VALTREX) 500 MG tablet Take 1 tablet (500 mg total) by mouth daily. 90 tablet 1  ? ?No current facility-administered medications on file prior to visit.   ? ? ?Allergies  ?Allergen Reactions  ? Latex Itching  ? Penicillins Hives and Other (See Comments)  ?  Childhood allergy Has patient had a PCN reaction causing immediate rash, facial/tongue/throat swelling, SOB or lightheadedness with hypotension: unknown ?Has patient had a PCN reaction causing severe rash involving mucus membranes or skin necrosis: unknown ?Has patient had a PCN reaction that required hospitalization unknown ?Has patient had a PCN reaction occurring within the last 10 years: no ?If all of the above answers are "NO", then may proceed with Cephalosporin use. ? ?  ? ? ?Objective:  ?General: Alert and oriented x3 in no acute distress ? ?Dermatology: No open lesions bilateral lower extremities, no webspace macerations, no ecchymosis bilateral, all nails x 10 are well manicured. ? ?Vascular: No significant swelling bilateral.. Dorsalis Pedis and Posterior Tibial pedal pulses 2/4, Capillary Fill Time 3 seconds,(+) pedal hair growth bilateral, Temperature gradient within normal limits. ? ?Neurology: Gross sensation intact via light touch bilateral. ? ?Musculoskeletal: There is continued pain along the plantar fascial insertion on the right and left consistent with planter fasciitis.  There is limited ankle joint range of motion noted bilateral.  No other pedal complaints noted. ? ?Assessment and Plan: ?Problem List Items Addressed This Visit   ?None ?Visit Diagnoses   ? ? Plantar fasciitis, right    -  Primary  ? Plantar fasciitis, left      ? Pain in right foot      ? Pain in left foot      ? ?  ? ? ? ? ?-  Complete examination performed ?-Re-Discussed with patient treatment options for chronic Plantar fasciitis on the right and left ?-Patient wants to hold off on surgery and considering EPAT at this time. ?-After oral consent and aseptic prep, injected a mixture containing 1 ml of 2%  ?plain lidocaine, 1 ml 0.5% plain marcaine, 0.5 ml of kenalog 10 and 0.5 ml of dexamethasone phosphate into right and left  heel at area of maximum pain at the plantar fascial insertion medial approach without complication. Post-injection care discussed with patient.  ?-Refill meloxicam to take as directed ?-Advised icing and gentle stretching as previously directed ?-Advised patient to buy an over-the-counter night splint to use as directed ?-Return to office if symptoms fail to improve or sooner if issues arise. ? ? ?Landis Martins, DPM ? ?

## 2022-03-02 DIAGNOSIS — F4310 Post-traumatic stress disorder, unspecified: Secondary | ICD-10-CM | POA: Diagnosis not present

## 2022-03-02 DIAGNOSIS — F331 Major depressive disorder, recurrent, moderate: Secondary | ICD-10-CM | POA: Diagnosis not present

## 2022-03-14 DIAGNOSIS — F4310 Post-traumatic stress disorder, unspecified: Secondary | ICD-10-CM | POA: Diagnosis not present

## 2022-03-14 DIAGNOSIS — F331 Major depressive disorder, recurrent, moderate: Secondary | ICD-10-CM | POA: Diagnosis not present

## 2022-03-21 DIAGNOSIS — L309 Dermatitis, unspecified: Secondary | ICD-10-CM | POA: Diagnosis not present

## 2022-03-22 DIAGNOSIS — F331 Major depressive disorder, recurrent, moderate: Secondary | ICD-10-CM | POA: Diagnosis not present

## 2022-03-22 DIAGNOSIS — F4310 Post-traumatic stress disorder, unspecified: Secondary | ICD-10-CM | POA: Diagnosis not present

## 2022-03-30 DIAGNOSIS — F331 Major depressive disorder, recurrent, moderate: Secondary | ICD-10-CM | POA: Diagnosis not present

## 2022-03-30 DIAGNOSIS — F4310 Post-traumatic stress disorder, unspecified: Secondary | ICD-10-CM | POA: Diagnosis not present

## 2022-04-04 DIAGNOSIS — F4310 Post-traumatic stress disorder, unspecified: Secondary | ICD-10-CM | POA: Diagnosis not present

## 2022-04-04 DIAGNOSIS — F331 Major depressive disorder, recurrent, moderate: Secondary | ICD-10-CM | POA: Diagnosis not present

## 2022-04-06 ENCOUNTER — Ambulatory Visit: Payer: Medicaid Other | Admitting: Family Medicine

## 2022-04-06 VITALS — BP 128/80 | HR 80 | Ht 63.0 in | Wt 236.0 lb

## 2022-04-06 DIAGNOSIS — F419 Anxiety disorder, unspecified: Secondary | ICD-10-CM

## 2022-04-06 DIAGNOSIS — R5383 Other fatigue: Secondary | ICD-10-CM | POA: Diagnosis not present

## 2022-04-06 MED ORDER — ESCITALOPRAM OXALATE 10 MG PO TABS: 10.0000 mg | ORAL_TABLET | Freq: Every day | ORAL | 0 refills | Status: DC
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
## 2022-04-06 NOTE — Patient Instructions (Addendum)
It was wonderful to see you today. ? ?Please bring ALL of your medications with you to every visit.  ? ?Today we talked about: ? ?Fatigue we will check your thyroid levels.  We also talked about possible anxiety and panic attacks.  We decided to start Lexapro 10 mg.  We will follow-up in 2 weeks for a mood check.  Please continue your regular therapy sessions. ? ?Please be sure to schedule follow up at the front  desk before you leave today.  ? ?If you haven't already, sign up for My Chart to have easy access to your labs results, and communication with your primary care physician. ? ?Please call the clinic at [phone] if your symptoms worsen or you have any concerns. It was our pleasure to serve you. ? ?Dr. Janus Molder ? ?

## 2022-04-07 LAB — TSH: TSH: 2.12 u[IU]/mL (ref 0.450–4.500)

## 2022-04-12 DIAGNOSIS — F4310 Post-traumatic stress disorder, unspecified: Secondary | ICD-10-CM | POA: Diagnosis not present

## 2022-04-12 DIAGNOSIS — F331 Major depressive disorder, recurrent, moderate: Secondary | ICD-10-CM | POA: Diagnosis not present

## 2022-04-18 ENCOUNTER — Encounter: Payer: Self-pay | Admitting: *Deleted

## 2022-04-18 ENCOUNTER — Ambulatory Visit: Payer: Medicaid Other | Admitting: Student

## 2022-04-18 ENCOUNTER — Encounter: Payer: Self-pay | Admitting: Student

## 2022-04-18 VITALS — BP 110/62 | HR 67 | Ht 63.0 in | Wt 232.0 lb

## 2022-04-18 DIAGNOSIS — F419 Anxiety disorder, unspecified: Secondary | ICD-10-CM | POA: Diagnosis not present
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
## 2022-04-18 NOTE — Assessment & Plan Note (Signed)
PHQ-9 score of 10 and GAD-7 score of 16.  Despite the elevated scoring, patient appears to have excellent insight into her own personal difficulties and healthy coping mechanisms likely to continue improving with therapy.  Will titrate Lexapro to appropriate dose.  Patient to follow-up via MyChart or phone call in 3 weeks.  If continuing to do well, increase dosage to 20 mg daily.  Advised following up 4 weeks afterwards.  Patient amenable to plan.

## 2022-04-18 NOTE — Patient Instructions (Signed)
It was great to see you today! Thank you for choosing [facility] for your primary care. Connie Jenkins was seen for anxiety/fatigue.  Today we addressed: I suspect much of this is related to change in life events and new stressors.  Please continue your medication daily and seeing your behavioral therapist weekly.  You are doing an excellent job with so many recent life changes.  If you are still doing well with the medication and not experiencing any side effects, we can increase your dose in a few weeks.  I would like to plan a follow-up visit 4 weeks after increasing your dosage at that time.  If you haven't already, sign up for My Chart to have easy access to your labs results, and communication with your primary care physician.  You should return to our clinic Return Depending on symptoms.  Please arrive 15 minutes before your appointment to ensure smooth check in process.  We appreciate your efforts in making this happen.  Please call the clinic at [phone] if your symptoms worsen or you have any concerns.  Thank you for allowing me to participate in your care, [name]) Madison Hickman, DO 04/18/2022, 9:31 AM PGY-1, [place]

## 2022-04-19 DIAGNOSIS — F331 Major depressive disorder, recurrent, moderate: Secondary | ICD-10-CM | POA: Diagnosis not present

## 2022-04-19 DIAGNOSIS — F4310 Post-traumatic stress disorder, unspecified: Secondary | ICD-10-CM | POA: Diagnosis not present

## 2022-05-02 ENCOUNTER — Encounter: Payer: Self-pay | Admitting: *Deleted

## 2022-05-03 ENCOUNTER — Ambulatory Visit (INDEPENDENT_AMBULATORY_CARE_PROVIDER_SITE_OTHER): Payer: Medicaid Other | Admitting: Family Medicine

## 2022-05-03 VITALS — BP 110/70 | HR 70 | Ht 63.0 in | Wt 232.0 lb

## 2022-05-03 DIAGNOSIS — R21 Rash and other nonspecific skin eruption: Secondary | ICD-10-CM | POA: Diagnosis not present

## 2022-05-03 MED ORDER — VALACYCLOVIR HCL 1 G PO TABS: 1000.0000 mg | ORAL_TABLET | Freq: Three times a day (TID) | ORAL | 0 refills | Status: AC

## 2022-05-03 NOTE — Patient Instructions (Signed)
My suspicion at this time is that you may have a shingles outbreak.  I am going to send in a medication called Valtrex that you will take 3 times a day for the next 7 days.  This will help but I cannot guarantee that it will improve everything as it is usually most beneficial if taken within the first 48 hours of the symptoms.  We will trial this, if you do not see any improvement or if it starts to get worse after a week then please let me know.

## 2022-05-03 NOTE — Progress Notes (Signed)
    SUBJECTIVE:   CHIEF COMPLAINT / HPI:   Patient presents with a painful "rash" that is present on the upper left side of her back and began on Saturday.  She notes that she thought it was originally related to her bra and that she changed from detergents with no improvement.  The area is painful and somewhat pruritic, when she had a failure and will look at it they did not notice any true skin changes in the area.  Patient does note that sometimes it radiates down into her arms intermittently.  She does have a history of chickenpox.  Is up-to-date on other vaccinations.  Patient has never had a rash like this before.  PERTINENT  PMH / PSH: Reviewed  OBJECTIVE:   BP 110/70   Pulse 70   Ht '5\' 3"'$  (1.6 m)   Wt 232 lb (105.2 kg)   LMP 04/06/2022 (Exact Date)   SpO2 98%   BMI 41.10 kg/m   General: NAD, well-appearing, well-nourished Respiratory: No respiratory distress, breathing comfortably, able to speak in full sentences Skin: warm and dry, no rashes noted on exposed skin.  Skin over the left upper back is tender to light skin touch, no obvious blistering or hyperpigmentation or lesions.  No skin symptoms on the right side of the back.  ASSESSMENT/PLAN:   Nonspecific skin eruption Patient with painful region that is in a somewhat dermatomal pattern on the left upper back.  No obvious rash or findings other than the sensitivity/pain.  Does have a history of chickenpox.  Given lack of current rash and symptoms are unilateral in specific region, consideration for shingles outbreak.  Did consider back etiology but patient has no risk factors or recent injuries and is less likely.  We will trial medication but unsure if patient will receive full benefit given it has been more than 48 hours since symptoms began. - Valacyclovir 1 g 3 times daily x7 days - Return precautions given   Rise Patience, [place]

## 2022-05-05 DIAGNOSIS — F4310 Post-traumatic stress disorder, unspecified: Secondary | ICD-10-CM | POA: Diagnosis not present

## 2022-05-05 DIAGNOSIS — F331 Major depressive disorder, recurrent, moderate: Secondary | ICD-10-CM | POA: Diagnosis not present

## 2022-05-09 ENCOUNTER — Encounter: Payer: Self-pay | Admitting: Student

## 2022-05-16 DIAGNOSIS — F458 Other somatoform disorders: Secondary | ICD-10-CM | POA: Diagnosis not present

## 2022-05-17 ENCOUNTER — Encounter: Payer: Self-pay | Admitting: Student

## 2022-05-17 DIAGNOSIS — F4310 Post-traumatic stress disorder, unspecified: Secondary | ICD-10-CM | POA: Diagnosis not present

## 2022-05-17 DIAGNOSIS — F331 Major depressive disorder, recurrent, moderate: Secondary | ICD-10-CM | POA: Diagnosis not present

## 2022-05-23 DIAGNOSIS — F4310 Post-traumatic stress disorder, unspecified: Secondary | ICD-10-CM | POA: Diagnosis not present

## 2022-05-23 DIAGNOSIS — F331 Major depressive disorder, recurrent, moderate: Secondary | ICD-10-CM | POA: Diagnosis not present

## 2022-06-01 DIAGNOSIS — F331 Major depressive disorder, recurrent, moderate: Secondary | ICD-10-CM | POA: Diagnosis not present

## 2022-06-01 DIAGNOSIS — F4310 Post-traumatic stress disorder, unspecified: Secondary | ICD-10-CM | POA: Diagnosis not present

## 2022-06-07 DIAGNOSIS — G5603 Carpal tunnel syndrome, bilateral upper limbs: Secondary | ICD-10-CM

## 2022-06-07 DIAGNOSIS — F331 Major depressive disorder, recurrent, moderate: Secondary | ICD-10-CM | POA: Diagnosis not present

## 2022-06-07 DIAGNOSIS — F4310 Post-traumatic stress disorder, unspecified: Secondary | ICD-10-CM | POA: Diagnosis not present

## 2022-06-07 HISTORY — DX: Carpal tunnel syndrome, bilateral upper limbs: G56.03

## 2022-06-13 ENCOUNTER — Telehealth: Payer: Self-pay | Admitting: *Deleted

## 2022-06-13 NOTE — Telephone Encounter (Signed)
Refill request for valacyclovir came in from pharmacy.  Did not see on current med list.  Please refill if appropriate.Connie Jenkins, CMA

## 2022-06-14 DIAGNOSIS — F4310 Post-traumatic stress disorder, unspecified: Secondary | ICD-10-CM | POA: Diagnosis not present

## 2022-06-14 DIAGNOSIS — F331 Major depressive disorder, recurrent, moderate: Secondary | ICD-10-CM | POA: Diagnosis not present

## 2022-06-15 NOTE — Telephone Encounter (Signed)
Wells Guiles, DO  You 17 hours ago (4:51 PM)    Refill not appropriate. She received treatment for suspected shingles in early June but should not need refill of medication.

## 2022-06-20 DIAGNOSIS — F4310 Post-traumatic stress disorder, unspecified: Secondary | ICD-10-CM | POA: Diagnosis not present

## 2022-06-20 DIAGNOSIS — F331 Major depressive disorder, recurrent, moderate: Secondary | ICD-10-CM | POA: Diagnosis not present

## 2022-06-26 ENCOUNTER — Encounter: Payer: Self-pay | Admitting: Podiatry

## 2022-06-26 ENCOUNTER — Ambulatory Visit: Payer: Medicaid Other | Admitting: Podiatry

## 2022-06-26 DIAGNOSIS — M722 Plantar fascial fibromatosis: Secondary | ICD-10-CM | POA: Diagnosis not present

## 2022-06-26 MED ORDER — TRIAMCINOLONE ACETONIDE 10 MG/ML IJ SUSP
20.0000 mg | Freq: Once | INTRAMUSCULAR | Status: AC
  Administered 2022-06-26: 20 mg

## 2022-06-26 NOTE — Progress Notes (Signed)
Subjective:   Patient ID: Connie Jenkins, female   DOB: 41 y.o.   MRN: [id number]   HPI Patient states her heels have been getting intensely sore recently and she is trying to wear tennis shoes more and she is starting more weightbearing with her work   ROS      Objective:  Physical Exam  Neurovascular status intact with exquisite discomfort plantar aspect heel region bilateral with inflammation fluid of the medial band     Assessment:  Acute plantar fasciitis bilateral with inflammation fluid buildup     Plan:  Reviewed acute inflammation and went ahead today did sterile prep and injected the medial fascial band at insertion 3 mg Kenalog 5 mg Xylocaine advised on getting night splints on Utilizing ice therapy and wearing good supportive shoe gear.  Reappoint as symptoms indicate  X-rays indicate spurs no indication stress fracture arthritis

## 2022-06-28 DIAGNOSIS — F331 Major depressive disorder, recurrent, moderate: Secondary | ICD-10-CM | POA: Diagnosis not present

## 2022-06-28 DIAGNOSIS — F4310 Post-traumatic stress disorder, unspecified: Secondary | ICD-10-CM | POA: Diagnosis not present

## 2022-07-04 ENCOUNTER — Other Ambulatory Visit: Payer: Self-pay | Admitting: Family Medicine

## 2022-07-04 DIAGNOSIS — F331 Major depressive disorder, recurrent, moderate: Secondary | ICD-10-CM | POA: Diagnosis not present

## 2022-07-04 DIAGNOSIS — F419 Anxiety disorder, unspecified: Secondary | ICD-10-CM

## 2022-07-04 DIAGNOSIS — F4310 Post-traumatic stress disorder, unspecified: Secondary | ICD-10-CM | POA: Diagnosis not present

## 2022-07-11 DIAGNOSIS — F4310 Post-traumatic stress disorder, unspecified: Secondary | ICD-10-CM | POA: Diagnosis not present

## 2022-07-11 DIAGNOSIS — F331 Major depressive disorder, recurrent, moderate: Secondary | ICD-10-CM | POA: Diagnosis not present

## 2022-07-12 DIAGNOSIS — M79642 Pain in left hand: Secondary | ICD-10-CM | POA: Diagnosis not present

## 2022-07-12 DIAGNOSIS — M79641 Pain in right hand: Secondary | ICD-10-CM | POA: Diagnosis not present

## 2022-07-12 DIAGNOSIS — R200 Anesthesia of skin: Secondary | ICD-10-CM | POA: Diagnosis not present

## 2022-07-14 DIAGNOSIS — G5603 Carpal tunnel syndrome, bilateral upper limbs: Secondary | ICD-10-CM | POA: Diagnosis not present

## 2022-07-18 DIAGNOSIS — G5603 Carpal tunnel syndrome, bilateral upper limbs: Secondary | ICD-10-CM | POA: Diagnosis not present

## 2022-07-19 DIAGNOSIS — F4310 Post-traumatic stress disorder, unspecified: Secondary | ICD-10-CM | POA: Diagnosis not present

## 2022-07-19 DIAGNOSIS — F331 Major depressive disorder, recurrent, moderate: Secondary | ICD-10-CM | POA: Diagnosis not present

## 2022-07-26 DIAGNOSIS — F4310 Post-traumatic stress disorder, unspecified: Secondary | ICD-10-CM | POA: Diagnosis not present

## 2022-07-26 DIAGNOSIS — F331 Major depressive disorder, recurrent, moderate: Secondary | ICD-10-CM | POA: Diagnosis not present

## 2022-08-01 DIAGNOSIS — F4310 Post-traumatic stress disorder, unspecified: Secondary | ICD-10-CM | POA: Diagnosis not present

## 2022-08-01 DIAGNOSIS — F331 Major depressive disorder, recurrent, moderate: Secondary | ICD-10-CM | POA: Diagnosis not present

## 2022-08-06 ENCOUNTER — Encounter: Payer: Self-pay | Admitting: Family Medicine

## 2022-08-07 ENCOUNTER — Other Ambulatory Visit: Payer: Self-pay

## 2022-08-07 ENCOUNTER — Encounter: Payer: Self-pay | Admitting: Family Medicine

## 2022-08-07 ENCOUNTER — Other Ambulatory Visit (HOSPITAL_COMMUNITY)
Admission: RE | Admit: 2022-08-07 | Discharge: 2022-08-07 | Disposition: A | Discharge status: Home / Self Care | Payer: Medicaid Other | Source: Ambulatory Visit | Attending: Family Medicine | Admitting: Family Medicine

## 2022-08-07 ENCOUNTER — Ambulatory Visit: Payer: Medicaid Other | Admitting: Family Medicine

## 2022-08-07 VITALS — BP 124/86 | HR 67 | Wt 217.6 lb

## 2022-08-07 DIAGNOSIS — Z8632 Personal history of gestational diabetes: Secondary | ICD-10-CM

## 2022-08-07 DIAGNOSIS — Z113 Encounter for screening for infections with a predominantly sexual mode of transmission: Secondary | ICD-10-CM | POA: Insufficient documentation

## 2022-08-07 DIAGNOSIS — Z309 Encounter for contraceptive management, unspecified: Secondary | ICD-10-CM | POA: Diagnosis not present

## 2022-08-07 LAB — POCT WET PREP (WET MOUNT)
Clue Cells Wet Prep Whiff POC: NEGATIVE
Trichomonas Wet Prep HPF POC: ABSENT

## 2022-08-07 LAB — POCT URINE PREGNANCY: Preg Test, Ur: NEGATIVE

## 2022-08-07 MED ORDER — FLUCONAZOLE 150 MG PO TABS: 150.0000 mg | ORAL_TABLET | ORAL | 0 refills | Status: DC

## 2022-08-07 MED ORDER — MEDROXYPROGESTERONE ACETATE 150 MG/ML IM SUSY
150.0000 mg | PREFILLED_SYRINGE | Freq: Once | INTRAMUSCULAR | Status: AC
  Administered 2022-08-07: 150 mg via INTRAMUSCULAR

## 2022-08-07 NOTE — Patient Instructions (Addendum)
It was wonderful to see you today.  Please bring ALL of your medications with you to every visit.   Today we talked about:   -Options for contraception---I have attached information about the Nexplanon and the Mirena IUD Please call if you wish to schedule one of these  I will message you with results of your testing    You have a yeast infection I sent in a pill to take ONE today then one in [place] (Thursday)    Please follow up in 1 months   Thank you for choosing [facility].   Please call [phone] with any questions about today's appointment.  Please be sure to schedule follow up at the front  desk before you leave today.   Dorris Singh, MD  Family Medicine

## 2022-08-07 NOTE — Progress Notes (Signed)
    SUBJECTIVE:   CHIEF COMPLAINT: infection and med check  HPI:   Connie Jenkins is a 41 y.o.  with history notable for mood disorder and obesity presenting for follow up and concerns for infection.   She recently started being sexually active again. Has 1 female partner. Last week had some vaginal pruritis and used an OTC product without improvement. No dysuria, hematuria. No abdominal pain or fevers. Uses nothing for contraception currently. Was on Depo previously but had weight gain. Has chronic migraine headaches. Reports she cannot remember to take pills each day. Interested in [place]. UTD on Pap.   The patient has been pleased with her lifestyle changes. She started exercising and focusing on herself. She is going back to school for aesthetician. She has lost ~20 pounds intentionally.   In terms of her mood disorder, she stopped lexapro. Feels well, no issues with sleep. PHQ9 is 0.   PERTINENT  PMH / PSH/Family/Social History : History of GDM   OBJECTIVE:   BP 124/86   Pulse 67   Wt 217 lb 9.6 oz (98.7 kg)   LMP 07/19/2022   SpO2 100%   BMI 38.55 kg/m   Today's weight:  Last Weight  Most recent update: 08/07/2022  8:28 AM    Weight  98.7 kg (217 lb 9.6 oz)            Review of prior weights: [employer]   08/07/22 0827  Weight: 217 lb 9.6 oz (98.7 kg)    Cardiac: Regular rate and rhythm. Normal S1/S2. No murmurs, rubs, or gallops appreciated. Lungs: Clear bilaterally to ascultation.  Psych: Pleasant and appropriate  GU Exam:    External exam: Normal-appearing female external genitalia.  Vaginal exam notable for + thick white discharge.  Cervix without discharge or obvious lesion.   Chaperoned examine, CMA D. Bloun.    ASSESSMENT/PLAN:   Contraception counseling - Discussed options at length, decided to do 1 dose of Depo Provera to bridge to her either Nexplanon or [place] may be less ideal as can increase headache frequency  - She will call to  schedule  Yeast vaginitis - By exam, Rx fluconazole x 2 doses - A1C today  STI Testing - Sent for GC/CT, HIV, RPR,  Hep B and C - UTD on Pap, contraception as above   HCM History of GDM- A1C today    Dorris Singh, MD  [place]

## 2022-08-08 LAB — CERVICOVAGINAL ANCILLARY ONLY
Chlamydia: NEGATIVE
Comment: NEGATIVE
Comment: NORMAL
Neisseria Gonorrhea: NEGATIVE

## 2022-08-08 LAB — HEMOGLOBIN A1C
Est. average glucose Bld gHb Est-mCnc: 123 mg/dL
Hgb A1c MFr Bld: 5.9 % — ABNORMAL HIGH (ref 4.8–5.6)

## 2022-08-08 LAB — HCV AB W REFLEX TO QUANT PCR: HCV Ab: NONREACTIVE

## 2022-08-08 LAB — HCV INTERPRETATION

## 2022-08-08 LAB — HEPATITIS B SURFACE ANTIGEN: Hepatitis B Surface Ag: NEGATIVE

## 2022-08-08 LAB — RPR: RPR Ser Ql: NONREACTIVE

## 2022-08-08 LAB — HIV ANTIBODY (ROUTINE TESTING W REFLEX): HIV Screen 4th Generation wRfx: NONREACTIVE

## 2022-08-09 DIAGNOSIS — F4310 Post-traumatic stress disorder, unspecified: Secondary | ICD-10-CM | POA: Diagnosis not present

## 2022-08-09 DIAGNOSIS — F331 Major depressive disorder, recurrent, moderate: Secondary | ICD-10-CM | POA: Diagnosis not present

## 2022-08-16 DIAGNOSIS — F331 Major depressive disorder, recurrent, moderate: Secondary | ICD-10-CM | POA: Diagnosis not present

## 2022-08-16 DIAGNOSIS — F4310 Post-traumatic stress disorder, unspecified: Secondary | ICD-10-CM | POA: Diagnosis not present

## 2022-08-23 DIAGNOSIS — F331 Major depressive disorder, recurrent, moderate: Secondary | ICD-10-CM | POA: Diagnosis not present

## 2022-08-23 DIAGNOSIS — F4310 Post-traumatic stress disorder, unspecified: Secondary | ICD-10-CM | POA: Diagnosis not present

## 2022-09-07 NOTE — Progress Notes (Signed)
    SUBJECTIVE:   CHIEF COMPLAINT / HPI:   Rand is a 41 year old female with a history of mood disorder and obesity here for follow-up of headaches.  She has a history of chronic migraine headaches.  She is not currently prescribed any medication for this.  She says that she was wondering if it was from her Depo.  She took some allergy medication from the [facility] the other day, and her headaches have improved since she started taking it.  She would like a flu shot today.  She also had a feeling the other day in the store where she felt hot, flushed and panicky.  This has happened a couple of times randomly.  This is never happened before.  She is currently going to therapy once a week.  She is no longer taking Lexapro.  No SI.  PERTINENT  PMH / PSH: Reviewed  OBJECTIVE:   BP 113/79   Pulse 79   Wt 216 lb 6.4 oz (98.2 kg)   SpO2 99%   BMI 38.33 kg/m   General: Alert and cooperative and appears to be in no acute distress Cardio: Normal S1 and S2, no S3 or S4. Rhythm is regular. No murmurs or rubs.   Pulm: Clear to auscultation bilaterally, no crackles, wheezing, or diminished breath sounds. Normal respiratory effort Abdomen: Bowel sounds normal. Abdomen soft and non-tender.  Extremities: No peripheral edema. Warm/ well perfused.  Strong radial pulses. Neuro: Cranial nerves grossly intact Psych: Normal mood and affect.  Good eye contact.   ASSESSMENT/PLAN:   Headache Resolved headaches. These were different from her classic migraines. Likely secondary to seasonal allergies. Start Zyrtec and Flonase daily Return precautions discussed  Anxiety PHQ-9 score of 0, #9 0.  She is doing a wonderful job participating in weekly therapy. She has not been taking Lexapro for some time.  It sounds like she is also having panic attacks that are new.  They are not debilitating, but they do come on randomly particularly when she was in public at the store. Will resume Lexapro 10 mg  daily.  Discussed that it is important to take this every single day for full effect, and that she should not expect full effect for 4 to 6 weeks. She is to follow-up should her panic attacks worsen or do not improve with Lexapro.  We discussed grounding techniques and 4 x 4 breathing.  Patient agreeable to plan.   Goes to therapy once a week.  Had panic attack on Tuesday-felt hot and panicky, butterflies.   Orvis Brill, [place]

## 2022-09-08 ENCOUNTER — Encounter: Payer: Self-pay | Admitting: Student

## 2022-09-08 ENCOUNTER — Ambulatory Visit: Payer: Medicaid Other | Admitting: Student

## 2022-09-08 VITALS — BP 113/79 | HR 79 | Wt 216.4 lb

## 2022-09-08 DIAGNOSIS — Z23 Encounter for immunization: Secondary | ICD-10-CM

## 2022-09-08 DIAGNOSIS — F419 Anxiety disorder, unspecified: Secondary | ICD-10-CM

## 2022-09-08 DIAGNOSIS — F410 Panic disorder [episodic paroxysmal anxiety] without agoraphobia: Secondary | ICD-10-CM | POA: Diagnosis not present

## 2022-09-08 DIAGNOSIS — R519 Headache, unspecified: Secondary | ICD-10-CM | POA: Insufficient documentation

## 2022-09-08 MED ORDER — ESCITALOPRAM OXALATE 10 MG PO TABS: 10.0000 mg | ORAL_TABLET | Freq: Every day | ORAL | 3 refills | Status: DC

## 2022-09-08 MED ORDER — FLUTICASONE PROPIONATE 50 MCG/ACT NA SUSP: 2.0000 | Freq: Every day | NASAL | 6 refills | Status: DC

## 2022-09-08 MED ORDER — CETIRIZINE HCL 10 MG PO TABS: 10.0000 mg | ORAL_TABLET | Freq: Every day | ORAL | 3 refills | Status: DC

## 2022-09-08 NOTE — Patient Instructions (Addendum)
It was great seeing you today.  I sent in Zyrtec and Flonase for your allergies. I am so glad your headaches are better.   I also sent in Lexapro for anxiety/panic. Please take this every day. You will feel the full effects in 4-6 weeks. Use the grounding techniques we discussed (4 x4 breathing, engaging your senses) if you feel a panic attack coming on. Please let me know if you are not feeling better in 1 month.   If you have any questions or concerns, please feel free to call the clinic.    Be well,  Dr. Orvis Brill [facility] [phone]

## 2022-09-08 NOTE — Assessment & Plan Note (Signed)
Resolved headaches. These were different from her classic migraines. Likely secondary to seasonal allergies. Start Zyrtec and Flonase daily Return precautions discussed

## 2022-09-08 NOTE — Assessment & Plan Note (Signed)
PHQ-9 score of 0, #9 0.  She is doing a wonderful job participating in weekly therapy. She has not been taking Lexapro for some time.  It sounds like she is also having panic attacks that are new.  They are not debilitating, but they do come on randomly particularly when she was in public at the store. Will resume Lexapro 10 mg daily.  Discussed that it is important to take this every single day for full effect, and that she should not expect full effect for 4 to 6 weeks. She is to follow-up should her panic attacks worsen or do not improve with Lexapro.  We discussed grounding techniques and 4 x 4 breathing.  Patient agreeable to plan.

## 2022-09-20 DIAGNOSIS — F331 Major depressive disorder, recurrent, moderate: Secondary | ICD-10-CM | POA: Diagnosis not present

## 2022-09-20 DIAGNOSIS — F4310 Post-traumatic stress disorder, unspecified: Secondary | ICD-10-CM | POA: Diagnosis not present

## 2022-10-05 DIAGNOSIS — F4310 Post-traumatic stress disorder, unspecified: Secondary | ICD-10-CM | POA: Diagnosis not present

## 2022-10-05 DIAGNOSIS — F331 Major depressive disorder, recurrent, moderate: Secondary | ICD-10-CM | POA: Diagnosis not present

## 2022-10-13 DIAGNOSIS — F4310 Post-traumatic stress disorder, unspecified: Secondary | ICD-10-CM | POA: Diagnosis not present

## 2022-10-13 DIAGNOSIS — F331 Major depressive disorder, recurrent, moderate: Secondary | ICD-10-CM | POA: Diagnosis not present

## 2022-10-24 DIAGNOSIS — F4310 Post-traumatic stress disorder, unspecified: Secondary | ICD-10-CM | POA: Diagnosis not present

## 2022-10-24 DIAGNOSIS — F331 Major depressive disorder, recurrent, moderate: Secondary | ICD-10-CM | POA: Diagnosis not present

## 2022-10-31 DIAGNOSIS — F331 Major depressive disorder, recurrent, moderate: Secondary | ICD-10-CM | POA: Diagnosis not present

## 2022-10-31 DIAGNOSIS — F4310 Post-traumatic stress disorder, unspecified: Secondary | ICD-10-CM | POA: Diagnosis not present

## 2022-11-07 DIAGNOSIS — F331 Major depressive disorder, recurrent, moderate: Secondary | ICD-10-CM | POA: Diagnosis not present

## 2022-11-15 DIAGNOSIS — F331 Major depressive disorder, recurrent, moderate: Secondary | ICD-10-CM | POA: Diagnosis not present

## 2022-11-22 ENCOUNTER — Encounter: Payer: Self-pay | Admitting: Family Medicine

## 2022-11-22 ENCOUNTER — Ambulatory Visit: Payer: Medicaid Other | Admitting: Family Medicine

## 2022-11-22 VITALS — BP 118/80 | HR 79 | Wt 217.2 lb

## 2022-11-22 DIAGNOSIS — R399 Unspecified symptoms and signs involving the genitourinary system: Secondary | ICD-10-CM

## 2022-11-22 DIAGNOSIS — R300 Dysuria: Secondary | ICD-10-CM | POA: Diagnosis not present

## 2022-11-22 DIAGNOSIS — R42 Dizziness and giddiness: Secondary | ICD-10-CM

## 2022-11-22 NOTE — Patient Instructions (Addendum)
It was great seeing you today!  Today we discussed your urinary symptoms, we have gotten a urine sample to test for a urinary tract infection. Please make sure to stay hydrated by drinking 6-8 glasses of water a day.    For your vertigo, I have placed a referral to physical therapy vestibular rehab.   Please follow up at your next scheduled appointment, if anything arises between now and then, please don't hesitate to contact our office.   Thank you for allowing [place] to be a part of your medical care!  Thank you, Dr. Larae Grooms  Also a reminder of our clinic's no-show policy. Please make sure to arrive at least 15 minutes prior to your scheduled appointment time. Please try to cancel before 24 hours if you are not able to make it. If you no-show for 2 appointments then you will be receiving a warning letter. If you no-show after 3 visits, then you may be at risk of being dismissed from our clinic. This is to ensure that everyone is able to be seen in a timely manner. Thank you, we appreciate your assistance with this!

## 2022-11-22 NOTE — Assessment & Plan Note (Addendum)
-  seems to be a chronic issue for patient, neurological exam unremarkable without acute changes -orthostatics negative -referral placed to vestibular rehab, explained that this is the true treatment for her vertigo

## 2022-11-22 NOTE — Assessment & Plan Note (Signed)
-  UA with micro pending -low suspicion for renal stones as patient exhibits negative CVA tenderness -encouraged ample hydration -instructed to follow up in 1 week if symptoms persist, may consider wet prep along with renal stone study at that time

## 2022-11-22 NOTE — Progress Notes (Unsigned)
    SUBJECTIVE:   CHIEF COMPLAINT / HPI:   Patient with history of kidney stones presents with dysuria and increased urinary frequency. Symptoms started 2 weeks and has been getting worse since it started. In the past, her kidney stone spontaneously passed. Drinks plenty of water. Also feels that her vertigo is bothering her although she has this chronically. Denies fever, chills and abdominal pain.  OBJECTIVE:   BP 118/80   Pulse 79   Wt 217 lb 4 oz (98.5 kg)   SpO2 98%   BMI 38.48 kg/m   General: Patient well-appearing, in no acute distress. CV: RRR, no murmurs or gallops auscultated Resp: CTAB, no wheezing, rales or rhonchi noted Abdomen: soft, nontender, nondistended, presence of bowel sounds MSK: negative CVA tenderness bilaterally Neuro: CN 2-2 grossly intact, 5/5 UE and LE strength bilaterally, 5/5 interosseous strength bilaterally, gross sensation intact, negative Romberg, normal gait   ASSESSMENT/PLAN:   Dysuria -UA with micro pending -low suspicion for renal stones as patient exhibits negative CVA tenderness -encouraged ample hydration -instructed to follow up in 1 week if symptoms persist, may consider wet prep along with renal stone study at that time  Vertigo -seems to be a chronic issue for patient, neurological exam unremarkable without acute changes -orthostatics negative -referral placed to vestibular rehab, explained that this is the true treatment for her vertigo   -PHQ-9 score 5 with negative question 9 reviewed.   Donney Dice, [place]

## 2022-11-23 LAB — URINALYSIS
Bilirubin, UA: NEGATIVE
Glucose, UA: NEGATIVE
Ketones, UA: NEGATIVE
Leukocytes,UA: NEGATIVE
Nitrite, UA: NEGATIVE
Protein,UA: NEGATIVE
RBC, UA: NEGATIVE
Specific Gravity, UA: 1.025 (ref 1.005–1.030)
Urobilinogen, Ur: 1 mg/dL (ref 0.2–1.0)
pH, UA: 6.5 (ref 5.0–7.5)

## 2022-11-27 ENCOUNTER — Encounter: Payer: Self-pay | Admitting: Student

## 2022-11-30 NOTE — Telephone Encounter (Signed)
Patient returns call to nurse line. She is requesting kidney ultrasound. Advised that she schedule appointment for evaluation due to continued intermittent chills and fever.   Patient scheduled for tomorrow afternoon.   Talbot Grumbling, RN

## 2022-12-01 ENCOUNTER — Ambulatory Visit (INDEPENDENT_AMBULATORY_CARE_PROVIDER_SITE_OTHER): Payer: Medicaid Other | Admitting: Family Medicine

## 2022-12-01 VITALS — BP 111/81 | HR 90 | Temp 98.4°F | Ht 63.0 in | Wt 217.2 lb

## 2022-12-01 DIAGNOSIS — R1030 Lower abdominal pain, unspecified: Secondary | ICD-10-CM | POA: Diagnosis not present

## 2022-12-01 DIAGNOSIS — N200 Calculus of kidney: Secondary | ICD-10-CM

## 2022-12-01 DIAGNOSIS — N289 Disorder of kidney and ureter, unspecified: Secondary | ICD-10-CM

## 2022-12-01 DIAGNOSIS — R300 Dysuria: Secondary | ICD-10-CM | POA: Diagnosis not present

## 2022-12-01 DIAGNOSIS — Z20822 Contact with and (suspected) exposure to covid-19: Secondary | ICD-10-CM

## 2022-12-01 MED ORDER — TAMSULOSIN HCL 0.4 MG PO CAPS: 0.4000 mg | ORAL_CAPSULE | Freq: Every day | ORAL | 3 refills | Status: DC

## 2022-12-01 NOTE — Progress Notes (Cosign Needed)
    SUBJECTIVE:   CHIEF COMPLAINT / HPI:   Patient presents for possible exposure for COVID so would like testing, denies having symptoms.   Still having frequent urination and lower back pain which seems to be slightly worse than her last time. Endorsing dysuria as well. Has a history of kidney stones and  wants to get checked. Seen recently for this similar concern and at the time UA was negative.   OBJECTIVE:   BP 111/81   Pulse 90   Temp 98.4 F (36.9 C)   Ht '5\' 3"'$  (1.6 m)   Wt 217 lb 3.2 oz (98.5 kg)   SpO2 100%   BMI 38.48 kg/m   General: Patient well-appearing, in no acute distress. CV: RRR, no murmurs or gallops auscultated  Resp: CTAB, no wheezing, rales or rhonchi noted Abdomen: soft, nontender, nondistended, presence of bowel sounds, no rebound tenderness or guarding noted, no evidence of organomegaly  MSK: negative CVA tenderness bilaterally   ASSESSMENT/PLAN:   Exposure to COVID-19 virus -COVID testing pending per patient preference -respiratory exam unremarkable  -reassurance provided   Dysuria -concern for nephrolithiasis, ordered CT renal stone study given history and persistence of symptoms  -repeat UA with culture added -flomax prescribed -encouraged hydration  -follow up as appropriate   Very low suspicion that patient is pregnant given LMP late Dec and on Depo injection for contraception.   Donney Dice, [place]

## 2022-12-01 NOTE — Patient Instructions (Signed)
It was great seeing you today!  Today we discussed your symptoms, I have prescribed flomax to take daily. Please stay hydrated and we will get imaging to see if you have a kidney stone. We also got another urine sample and will let you know of the results.   Please follow up at your next scheduled appointment, if anything arises between now and then, please don't hesitate to contact our office.   Thank you for allowing [place] to be a part of your medical care!  Thank you, Dr. Larae Grooms  Also a reminder of our clinic's no-show policy. Please make sure to arrive at least 15 minutes prior to your scheduled appointment time. Please try to cancel before 24 hours if you are not able to make it. If you no-show for 2 appointments then you will be receiving a warning letter. If you no-show after 3 visits, then you may be at risk of being dismissed from our clinic. This is to ensure that everyone is able to be seen in a timely manner. Thank you, we appreciate your assistance with this!

## 2022-12-01 NOTE — Assessment & Plan Note (Signed)
-  concern for nephrolithiasis, ordered CT renal stone study given history and persistence of symptoms  -repeat UA with culture added -flomax prescribed -encouraged hydration  -follow up as appropriate

## 2022-12-01 NOTE — Assessment & Plan Note (Signed)
-  COVID testing pending per patient preference -respiratory exam unremarkable  -reassurance provided

## 2022-12-02 LAB — URINALYSIS
Bilirubin, UA: NEGATIVE
Glucose, UA: NEGATIVE
Ketones, UA: NEGATIVE
Leukocytes,UA: NEGATIVE
Nitrite, UA: NEGATIVE
RBC, UA: NEGATIVE
Specific Gravity, UA: 1.025 (ref 1.005–1.030)
Urobilinogen, Ur: 1 mg/dL (ref 0.2–1.0)
pH, UA: 6.5 (ref 5.0–7.5)

## 2022-12-03 LAB — NOVEL CORONAVIRUS, NAA: SARS-CoV-2, NAA: NOT DETECTED

## 2022-12-05 ENCOUNTER — Encounter: Payer: Self-pay | Admitting: Family Medicine

## 2022-12-05 LAB — URINE CULTURE

## 2022-12-06 ENCOUNTER — Other Ambulatory Visit: Payer: Medicaid Other

## 2022-12-08 DIAGNOSIS — F331 Major depressive disorder, recurrent, moderate: Secondary | ICD-10-CM | POA: Diagnosis not present

## 2022-12-08 DIAGNOSIS — F4310 Post-traumatic stress disorder, unspecified: Secondary | ICD-10-CM | POA: Diagnosis not present

## 2022-12-20 DIAGNOSIS — F4310 Post-traumatic stress disorder, unspecified: Secondary | ICD-10-CM | POA: Diagnosis not present

## 2022-12-20 DIAGNOSIS — F331 Major depressive disorder, recurrent, moderate: Secondary | ICD-10-CM | POA: Diagnosis not present

## 2022-12-29 DIAGNOSIS — F4310 Post-traumatic stress disorder, unspecified: Secondary | ICD-10-CM | POA: Diagnosis not present

## 2022-12-29 DIAGNOSIS — F331 Major depressive disorder, recurrent, moderate: Secondary | ICD-10-CM | POA: Diagnosis not present

## 2023-01-08 DIAGNOSIS — F4310 Post-traumatic stress disorder, unspecified: Secondary | ICD-10-CM | POA: Diagnosis not present

## 2023-01-08 DIAGNOSIS — F331 Major depressive disorder, recurrent, moderate: Secondary | ICD-10-CM | POA: Diagnosis not present

## 2023-01-09 ENCOUNTER — Encounter: Payer: Self-pay | Admitting: Student

## 2023-01-09 ENCOUNTER — Ambulatory Visit: Payer: Medicaid Other | Admitting: Student

## 2023-01-09 VITALS — BP 118/66 | HR 82 | Wt 220.2 lb

## 2023-01-09 DIAGNOSIS — N898 Other specified noninflammatory disorders of vagina: Secondary | ICD-10-CM

## 2023-01-09 DIAGNOSIS — R42 Dizziness and giddiness: Secondary | ICD-10-CM | POA: Diagnosis not present

## 2023-01-09 DIAGNOSIS — N200 Calculus of kidney: Secondary | ICD-10-CM | POA: Diagnosis not present

## 2023-01-09 DIAGNOSIS — R5383 Other fatigue: Secondary | ICD-10-CM | POA: Diagnosis not present

## 2023-01-09 LAB — POCT WET PREP (WET MOUNT)
Clue Cells Wet Prep Whiff POC: NEGATIVE
Trichomonas Wet Prep HPF POC: ABSENT

## 2023-01-09 LAB — POCT URINE PREGNANCY: Preg Test, Ur: NEGATIVE

## 2023-01-09 NOTE — Patient Instructions (Signed)
It was great to see you today! Thank you for choosing [facility] for your primary care.  Today we addressed: Kidney stone: We are scheduling an ultrasound for you and checking your urine.  Please take your Flomax. Vertigo: I have placed another referral order for vestibular rehab. Fatigue: We are checking some blood work.  I would try to pinpoint any sleep habits that are not helping you and whether or not you snore which may point to sleep apnea.  If you haven't already, sign up for My Chart to have easy access to your labs results, and communication with your primary care physician.  We are checking some labs today. If they are abnormal, I will call you. If they are normal, I will send you a MyChart message (if it is active) or a letter in the mail. If you do not hear about your labs in the next 2 weeks, please call the office. Call the clinic at [phone] if your symptoms worsen or you have any concerns.  You should return to our clinic Return if symptoms worsen or fail to improve. Please arrive 15 minutes before your appointment to ensure smooth check in process.  We appreciate your efforts in making this happen.  Thank you for allowing me to participate in your care, Wells Guiles, DO 01/09/2023, 3:54 PM PGY-2, [place]

## 2023-01-09 NOTE — Progress Notes (Unsigned)
  SUBJECTIVE:   CHIEF COMPLAINT / HPI:   Nephrolithiasis: Concern for kidney stone. Has had kidney stones in the past. Notes ammonia smell of urine. Endorses stinging of her urine and right sided flank pain. Denies fever/chills. She notes her diet is better. She did not try the Flomax.   Dizzy spells: Vertigo chronically, negative orthostatics on 11/22/2022.  Noted to have referral placed on date of last visit although I cannot find this referral.  Patient states she has not received a call for vestibular rehab.  Fatigue: Endorses new since December, every day. She is sleeping well and gets at least 6 hours. She did just purchase a new bed, unknown if she snores. Notes she has not had periods since her depo shot in August. States she has not been sexually active in 2 years.   PERTINENT  PMH / PSH: Anxiety, asthma, IBS, allergies, GDM, vertigo  OBJECTIVE:  BP 118/66   Pulse 82   Wt 220 lb 3.2 oz (99.9 kg)   SpO2 97%   BMI 39.01 kg/m  Physical Exam   ASSESSMENT/PLAN:  Vertigo  Other fatigue  Nephrolithiasis   No follow-ups on file. Wells Guiles, DO 01/09/2023, 3:51 PM PGY-***, [facility] {    This will disappear when note is signed, click to select method of visit    :1}

## 2023-01-10 ENCOUNTER — Ambulatory Visit (HOSPITAL_COMMUNITY)
Admission: RE | Admit: 2023-01-10 | Discharge: 2023-01-10 | Disposition: A | Discharge status: Home / Self Care | Payer: Medicaid Other | Source: Ambulatory Visit | Attending: Family Medicine | Admitting: Family Medicine

## 2023-01-10 DIAGNOSIS — N200 Calculus of kidney: Secondary | ICD-10-CM | POA: Diagnosis not present

## 2023-01-10 DIAGNOSIS — R109 Unspecified abdominal pain: Secondary | ICD-10-CM | POA: Diagnosis not present

## 2023-01-10 DIAGNOSIS — N898 Other specified noninflammatory disorders of vagina: Secondary | ICD-10-CM | POA: Insufficient documentation

## 2023-01-10 NOTE — Assessment & Plan Note (Signed)
History of nephrolithiasis, history is concerning for another although physical exam is unremarkable.  She does not appear in discomfort currently.  She may benefit from a stone study upon future recurrences.  I would recommend this should she need.  I did recommend she take her Flomax as she has been noncompliant with this.  Unfortunately, UA not collected during this encounter however I have called patient and she is amenable to returning for urine sample.

## 2023-01-10 NOTE — Assessment & Plan Note (Signed)
No concern for STD, not sexually active for several years.  No evidence of BV or yeast upon wet prep.  No treatment indicated.

## 2023-01-10 NOTE — Assessment & Plan Note (Signed)
Chronic, already diagnosed.  Will place new vestibular rehab referral.

## 2023-01-10 NOTE — Assessment & Plan Note (Signed)
New onset, she does not get the most sleep and questionable if sleep habits are playing a role.  Anemia unlikely given she is not actively had periods recently since Depo injection.  Although she is sexually inactive, in the setting of new onset fatigue and absence of menstruation, will check urine pregnancy.  TSH was checked within the last year, unlikely hypothyroidism.  Will further check vitamin D.  Advised improvement of sleep habits and further discussion.

## 2023-01-11 ENCOUNTER — Other Ambulatory Visit (INDEPENDENT_AMBULATORY_CARE_PROVIDER_SITE_OTHER): Payer: Medicaid Other

## 2023-01-11 DIAGNOSIS — N200 Calculus of kidney: Secondary | ICD-10-CM | POA: Diagnosis not present

## 2023-01-11 LAB — POCT URINALYSIS DIP (MANUAL ENTRY)
Bilirubin, UA: NEGATIVE
Glucose, UA: NEGATIVE mg/dL
Ketones, POC UA: NEGATIVE mg/dL
Leukocytes, UA: NEGATIVE
Nitrite, UA: NEGATIVE
Protein Ur, POC: NEGATIVE mg/dL
Spec Grav, UA: >=1.03 — AB (ref 1.010–1.025)
Urobilinogen, UA: 0.2 E.U./dL
pH, UA: 5.5 (ref 5.0–8.0)

## 2023-01-11 LAB — POCT UA - MICROSCOPIC ONLY
RBC, Urine, Miroscopic: >20 (ref 0–2)
WBC, Ur, HPF, POC: NONE SEEN (ref 0–5)

## 2023-01-12 ENCOUNTER — Encounter: Payer: Self-pay | Admitting: Student

## 2023-01-13 LAB — CBC
Hematocrit: 38.3 % (ref 34.0–46.6)
Hemoglobin: 12.4 g/dL (ref 11.1–15.9)
MCH: 26.1 pg — ABNORMAL LOW (ref 26.6–33.0)
MCHC: 32.4 g/dL (ref 31.5–35.7)
MCV: 81 fL (ref 79–97)
Platelets: 307 10*3/uL (ref 150–450)
RBC: 4.75 x10E6/uL (ref 3.77–5.28)
RDW: 12.5 % (ref 11.7–15.4)
WBC: 6.6 10*3/uL (ref 3.4–10.8)

## 2023-01-13 LAB — BASIC METABOLIC PANEL
BUN/Creatinine Ratio: 15 (ref 9–23)
BUN: 13 mg/dL (ref 6–20)
CO2: 22 mmol/L (ref 20–29)
Calcium: 9.4 mg/dL (ref 8.7–10.2)
Chloride: 103 mmol/L (ref 96–106)
Creatinine, Ser: 0.88 mg/dL (ref 0.57–1.00)
Glucose: 79 mg/dL (ref 70–99)
Potassium: 4.4 mmol/L (ref 3.5–5.2)
Sodium: 139 mmol/L (ref 134–144)
eGFR: 86 mL/min/{1.73_m2} (ref >59)

## 2023-01-13 LAB — VITAMIN D, 25-HYDROXY, TOTAL: Vitamin D, 25-Hydroxy, Serum: 5.1 ng/mL — ABNORMAL LOW

## 2023-01-13 LAB — URIC ACID: Uric Acid: 5 mg/dL (ref 2.6–6.2)

## 2023-01-15 ENCOUNTER — Other Ambulatory Visit: Payer: Self-pay | Admitting: Student

## 2023-01-15 DIAGNOSIS — F4310 Post-traumatic stress disorder, unspecified: Secondary | ICD-10-CM | POA: Diagnosis not present

## 2023-01-15 DIAGNOSIS — E559 Vitamin D deficiency, unspecified: Secondary | ICD-10-CM

## 2023-01-15 DIAGNOSIS — F331 Major depressive disorder, recurrent, moderate: Secondary | ICD-10-CM | POA: Diagnosis not present

## 2023-01-15 MED ORDER — VITAMIN D (ERGOCALCIFEROL) 1.25 MG (50000 UNIT) PO CAPS: 50000.0000 [IU] | ORAL_CAPSULE | ORAL | 0 refills | Status: DC

## 2023-01-23 DIAGNOSIS — F4310 Post-traumatic stress disorder, unspecified: Secondary | ICD-10-CM | POA: Diagnosis not present

## 2023-01-23 DIAGNOSIS — F331 Major depressive disorder, recurrent, moderate: Secondary | ICD-10-CM | POA: Diagnosis not present

## 2023-01-26 DIAGNOSIS — E041 Nontoxic single thyroid nodule: Secondary | ICD-10-CM

## 2023-01-26 DIAGNOSIS — R918 Other nonspecific abnormal finding of lung field: Secondary | ICD-10-CM

## 2023-01-26 HISTORY — DX: Nontoxic single thyroid nodule: E04.1

## 2023-01-26 HISTORY — DX: Other nonspecific abnormal finding of lung field: R91.8

## 2023-02-13 ENCOUNTER — Other Ambulatory Visit (HOSPITAL_BASED_OUTPATIENT_CLINIC_OR_DEPARTMENT_OTHER): Payer: Self-pay

## 2023-02-13 ENCOUNTER — Encounter (HOSPITAL_BASED_OUTPATIENT_CLINIC_OR_DEPARTMENT_OTHER): Payer: Self-pay

## 2023-02-13 ENCOUNTER — Emergency Department (HOSPITAL_BASED_OUTPATIENT_CLINIC_OR_DEPARTMENT_OTHER): Payer: Medicaid Other

## 2023-02-13 ENCOUNTER — Emergency Department (HOSPITAL_BASED_OUTPATIENT_CLINIC_OR_DEPARTMENT_OTHER)
Admission: EM | Admit: 2023-02-13 | Discharge: 2023-02-13 | Disposition: A | Discharge status: Home / Self Care | Payer: Medicaid Other | Attending: Emergency Medicine | Admitting: Emergency Medicine

## 2023-02-13 ENCOUNTER — Emergency Department (HOSPITAL_BASED_OUTPATIENT_CLINIC_OR_DEPARTMENT_OTHER): Payer: Medicaid Other | Admitting: Radiology

## 2023-02-13 ENCOUNTER — Other Ambulatory Visit: Payer: Self-pay

## 2023-02-13 DIAGNOSIS — J45909 Unspecified asthma, uncomplicated: Secondary | ICD-10-CM | POA: Diagnosis not present

## 2023-02-13 DIAGNOSIS — K5730 Diverticulosis of large intestine without perforation or abscess without bleeding: Secondary | ICD-10-CM | POA: Diagnosis not present

## 2023-02-13 DIAGNOSIS — R911 Solitary pulmonary nodule: Secondary | ICD-10-CM

## 2023-02-13 DIAGNOSIS — R5381 Other malaise: Secondary | ICD-10-CM | POA: Diagnosis not present

## 2023-02-13 DIAGNOSIS — R109 Unspecified abdominal pain: Secondary | ICD-10-CM | POA: Diagnosis not present

## 2023-02-13 DIAGNOSIS — E041 Nontoxic single thyroid nodule: Secondary | ICD-10-CM

## 2023-02-13 DIAGNOSIS — N200 Calculus of kidney: Secondary | ICD-10-CM | POA: Diagnosis not present

## 2023-02-13 DIAGNOSIS — R590 Localized enlarged lymph nodes: Secondary | ICD-10-CM | POA: Diagnosis not present

## 2023-02-13 DIAGNOSIS — R1031 Right lower quadrant pain: Secondary | ICD-10-CM | POA: Diagnosis not present

## 2023-02-13 DIAGNOSIS — R6883 Chills (without fever): Secondary | ICD-10-CM | POA: Insufficient documentation

## 2023-02-13 DIAGNOSIS — Z1152 Encounter for screening for COVID-19: Secondary | ICD-10-CM | POA: Diagnosis not present

## 2023-02-13 DIAGNOSIS — Z87891 Personal history of nicotine dependence: Secondary | ICD-10-CM | POA: Insufficient documentation

## 2023-02-13 DIAGNOSIS — D259 Leiomyoma of uterus, unspecified: Secondary | ICD-10-CM

## 2023-02-13 DIAGNOSIS — R918 Other nonspecific abnormal finding of lung field: Secondary | ICD-10-CM | POA: Diagnosis not present

## 2023-02-13 DIAGNOSIS — M5412 Radiculopathy, cervical region: Secondary | ICD-10-CM | POA: Diagnosis not present

## 2023-02-13 LAB — LIPASE, BLOOD: Lipase: 39 U/L (ref 11–51)

## 2023-02-13 LAB — URINALYSIS, ROUTINE W REFLEX MICROSCOPIC
Bilirubin Urine: NEGATIVE
Glucose, UA: NEGATIVE mg/dL
Hgb urine dipstick: NEGATIVE
Ketones, ur: NEGATIVE mg/dL
Leukocytes,Ua: NEGATIVE
Nitrite: NEGATIVE
Specific Gravity, Urine: 1.025 (ref 1.005–1.030)
pH: 6 (ref 5.0–8.0)

## 2023-02-13 LAB — CBC WITH DIFFERENTIAL/PLATELET
Abs Immature Granulocytes: 0.01 10*3/uL (ref 0.00–0.07)
Basophils Absolute: 0 10*3/uL (ref 0.0–0.1)
Basophils Relative: 0 %
Eosinophils Absolute: 0.2 10*3/uL (ref 0.0–0.5)
Eosinophils Relative: 4 %
HCT: 37.9 % (ref 36.0–46.0)
Hemoglobin: 12 g/dL (ref 12.0–15.0)
Immature Granulocytes: 0 %
Lymphocytes Relative: 21 %
Lymphs Abs: 1.3 10*3/uL (ref 0.7–4.0)
MCH: 26 pg (ref 26.0–34.0)
MCHC: 31.7 g/dL (ref 30.0–36.0)
MCV: 82.2 fL (ref 80.0–100.0)
Monocytes Absolute: 0.5 10*3/uL (ref 0.1–1.0)
Monocytes Relative: 7 %
Neutro Abs: 4.2 10*3/uL (ref 1.7–7.7)
Neutrophils Relative %: 68 %
Platelets: 286 10*3/uL (ref 150–400)
RBC: 4.61 MIL/uL (ref 3.87–5.11)
RDW: 13.4 % (ref 11.5–15.5)
WBC: 6.1 10*3/uL (ref 4.0–10.5)
nRBC: 0 % (ref 0.0–0.2)

## 2023-02-13 LAB — COMPREHENSIVE METABOLIC PANEL
ALT: 18 U/L (ref 0–44)
AST: 32 U/L (ref 15–41)
Albumin: 3.9 g/dL (ref 3.5–5.0)
Alkaline Phosphatase: 53 U/L (ref 38–126)
Anion gap: 6 (ref 5–15)
BUN: 14 mg/dL (ref 6–20)
CO2: 26 mmol/L (ref 22–32)
Calcium: 9.5 mg/dL (ref 8.9–10.3)
Chloride: 104 mmol/L (ref 98–111)
Creatinine, Ser: 0.81 mg/dL (ref 0.44–1.00)
GFR, Estimated: >60 mL/min (ref >60)
Glucose, Bld: 98 mg/dL (ref 70–99)
Potassium: 4 mmol/L (ref 3.5–5.1)
Sodium: 136 mmol/L (ref 135–145)
Total Bilirubin: 0.4 mg/dL (ref 0.3–1.2)
Total Protein: 8.3 g/dL — ABNORMAL HIGH (ref 6.5–8.1)

## 2023-02-13 LAB — CK: Total CK: 349 U/L — ABNORMAL HIGH (ref 38–234)

## 2023-02-13 LAB — PREGNANCY, URINE: Preg Test, Ur: NEGATIVE

## 2023-02-13 LAB — RESP PANEL BY RT-PCR (RSV, FLU A&B, COVID)  RVPGX2
Influenza A by PCR: NEGATIVE
Influenza B by PCR: NEGATIVE
Resp Syncytial Virus by PCR: NEGATIVE
SARS Coronavirus 2 by RT PCR: NEGATIVE

## 2023-02-13 LAB — MAGNESIUM: Magnesium: 2 mg/dL (ref 1.7–2.4)

## 2023-02-13 MED ORDER — LIDOCAINE VISCOUS HCL 2 % MT SOLN
15.0000 mL | Freq: Once | OROMUCOSAL | Status: AC
  Administered 2023-02-13: 15 mL via ORAL
  Filled 2023-02-13: qty 15

## 2023-02-13 MED ORDER — AZITHROMYCIN 250 MG PO TABS
250.0000 mg | ORAL_TABLET | Freq: Every day | ORAL | 0 refills | Status: DC
  Filled 2023-02-13: qty 6, 5d supply, fill #0

## 2023-02-13 MED ORDER — SODIUM CHLORIDE 0.9 % IV BOLUS
1000.0000 mL | Freq: Once | INTRAVENOUS | Status: AC
  Administered 2023-02-13: 1000 mL via INTRAVENOUS

## 2023-02-13 MED ORDER — KETOROLAC TROMETHAMINE 30 MG/ML IJ SOLN
30.0000 mg | Freq: Once | INTRAMUSCULAR | Status: AC
  Administered 2023-02-13: 30 mg via INTRAVENOUS
  Filled 2023-02-13: qty 1

## 2023-02-13 MED ORDER — IOHEXOL 300 MG/ML  SOLN
100.0000 mL | Freq: Once | INTRAMUSCULAR | Status: AC | PRN
  Administered 2023-02-13: 85 mL via INTRAVENOUS

## 2023-02-13 MED ORDER — ALUM & MAG HYDROXIDE-SIMETH 200-200-20 MG/5ML PO SUSP
30.0000 mL | Freq: Once | ORAL | Status: AC
  Administered 2023-02-13: 30 mL via ORAL
  Filled 2023-02-13: qty 30

## 2023-02-13 NOTE — ED Provider Notes (Signed)
[facility] Provider Note  CSN: [id number] Arrival date & time: 02/13/23 [id number]  Chief Complaint(s) Chills  HPI Connie Jenkins is a 42 y.o. female with past medical history as below, significant for IBS, PTSD, anxiety, asthma who presents to the ED with complaint of bodyaches, muscle cramping sensation of upper extremities, abdominal pain, malaise.  Dysuria.,  Frequency.  Symptoms ongoing for around a month.  Intermittent, unable to identify alleviating or exacerbating factors.  She was seen by PCP and was told she had blood in her urine around a month ago but did not follow-up.  Pain to bilateral upper extremities described as a cramping/spasm sensation.  No numbness or weakness.  No neck trauma.  He does have some pain to her neck describes a cramping sensation.  Upper thoracic spine.  Abdominal pain described as cramping, pressure sensation epigastrium, left upper quadrant and right upper quadrant.  No nausea or vomiting.  No change to p.o. intake.  No change in bowel function.  Symptoms improved with Motrin  Past Medical History Past Medical History:  Diagnosis Date   Anxiety    no meds   Asthma    inhaler rarely used - twice year   Depression    no current Tx.   Dysuria 02/09/2021   Gestational diabetes    Headache    Herpes 2008   IBS (irritable bowel syndrome)    Renal stone 08/26/2014   Seasonal allergies    Patient Active Problem List   Diagnosis Date Noted   Vitamin D deficiency 01/15/2023   Vaginal discharge 01/10/2023   Fatigue 01/09/2023   Exposure to COVID-19 virus 12/01/2022   Dysuria 11/22/2022   Vertigo 11/22/2022   Headache 09/08/2022   Anxiety 04/18/2022   Nephrolithiasis 01/03/2022   PTSD (post-traumatic stress disorder) 02/25/2021   Encounter for other contraceptive management 07/18/2017   Gestational diabetes mellitus (GDM) affecting pregnancy 04/08/2017   HSV (herpes simplex virus) infection 11/13/2016    Migraines 11/13/2016   CONDYLOMA ACUMINATUM 01/24/2007   Home Medication(s) Prior to Admission medications   Medication Sig Start Date End Date Taking? Authorizing Provider  azithromycin (ZITHROMAX) 250 MG tablet Take 1 tablet (250 mg total) by mouth daily. Take first 2 tablets together, then 1 every day until finished. 02/13/23  Yes Jeanell Sparrow, DO  cetirizine (ZYRTEC ALLERGY) 10 MG tablet Take 1 tablet (10 mg total) by mouth daily. 09/08/22   Dameron, Luna Fuse, DO  escitalopram (LEXAPRO) 10 MG tablet Take 1 tablet (10 mg total) by mouth daily. 09/08/22   Dameron, Luna Fuse, DO  fluticasone (FLONASE) 50 MCG/ACT nasal spray Place 2 sprays into both nostrils daily. 09/08/22   Dameron, Luna Fuse, DO  tamsulosin (FLOMAX) 0.4 MG CAPS capsule Take 1 capsule (0.4 mg total) by mouth daily. 12/01/22   Donney Dice, DO  Vitamin D, Ergocalciferol, (DRISDOL) 1.25 MG (50000 UNIT) CAPS capsule Take 1 capsule (50,000 Units total) by mouth every 7 (seven) days. 01/15/23   Wells Guiles, DO  Past Surgical History Past Surgical History:  Procedure Laterality Date   CESAREAN SECTION  2000 & 2004   "1st one I dilated 1cm, then the 2nd one was a rpt   CESAREAN SECTION N/A 06/15/2017   Procedure: CESAREAN SECTION;  Surgeon: Osborne Oman, MD;  Location: [facility];  Service: Obstetrics;  Laterality: N/A;   CYSTOSCOPY WITH RETROGRADE PYELOGRAM, URETEROSCOPY AND STENT PLACEMENT Left 08/26/2014   Procedure: CYSTOSCOPY WITH RETROGRADE PYELOGRAM, URETEROSCOPY, LASER, STONE EXTRACTION WITH BASKET;  Surgeon: Raynelle Bring, MD;  Location: [facility];  Service: Urology;  Laterality: Left;   HERNIA REPAIR     HYSTEROSCOPY N/A 03/24/2013   Procedure: HYSTEROSCOPY / Dilitation and curettage/endometrial curettings;  Surgeon: Lavonia Drafts, MD;  Location: [facility];  Service: Gynecology;   Laterality: N/A;  Diagnostic hysteroscopy   TMJ ARTHROPLASTY     WISDOM TOOTH EXTRACTION     Family History Family History  Problem Relation Age of Onset   Hypertension Mother    Diabetes Mother     Social History Social History   Tobacco Use   Smoking status: Former    Passive exposure: Never   Smokeless tobacco: Never  Vaping Use   Vaping Use: Never used  Substance Use Topics   Alcohol use: Not Currently    Alcohol/week: 1.0 standard drink of alcohol    Types: 1 Standard drinks or equivalent per week    Comment: occasionally   Drug use: No   Allergies Flagyl [metronidazole], Latex, and Penicillins  Review of Systems Review of Systems  Constitutional:  Negative for activity change and fever.  HENT:  Negative for facial swelling and trouble swallowing.   Eyes:  Negative for discharge and redness.  Respiratory:  Negative for cough and shortness of breath.   Cardiovascular:  Negative for chest pain and palpitations.  Gastrointestinal:  Positive for abdominal pain. Negative for nausea.  Genitourinary:  Negative for dysuria and flank pain.  Musculoskeletal:  Positive for myalgias and neck pain. Negative for back pain and gait problem.  Skin:  Negative for pallor and rash.  Neurological:  Negative for syncope and headaches.    Physical Exam Vital Signs  I have reviewed the triage vital signs BP 110/77 (BP Location: Left Arm)   Pulse 72   Temp 98.1 F (36.7 C) (Oral)   Resp 12   Ht 5\' 2"  (1.575 m)   Wt 99.8 kg   SpO2 100%   BMI 40.24 kg/m  Physical Exam Vitals and nursing note reviewed.  Constitutional:      General: She is not in acute distress.    Appearance: Normal appearance.  HENT:     Head: Normocephalic and atraumatic.     Right Ear: External ear normal.     Left Ear: External ear normal.     Nose: Nose normal.     Mouth/Throat:     Mouth: Mucous membranes are moist.  Eyes:     General: No scleral icterus.       Right eye: No discharge.         Left eye: No discharge.  Cardiovascular:     Rate and Rhythm: Normal rate and regular rhythm.     Pulses: Normal pulses.     Heart sounds: Normal heart sounds.  Pulmonary:     Effort: Pulmonary effort is normal. No respiratory distress.     Breath sounds: Normal breath sounds.  Abdominal:     General: Abdomen is flat.     Palpations: Abdomen  is soft.     Tenderness: There is no abdominal tenderness. There is no guarding or rebound.  Musculoskeletal:        General: No swelling, tenderness or deformity.     Cervical back: Normal range of motion. No rigidity.     Right lower leg: No edema.     Left lower leg: No edema.     Comments: Upper extremity compartments soft, NVI bilateral upper extremities  Skin:    General: Skin is warm and dry.     Capillary Refill: Capillary refill takes less than 2 seconds.  Neurological:     Mental Status: She is alert and oriented to person, place, and time.     GCS: GCS eye subscore is 4. GCS verbal subscore is 5. GCS motor subscore is 6.     Motor: Motor function is intact.     Coordination: Coordination is intact.     Gait: Gait is intact.  Psychiatric:        Mood and Affect: Mood normal.        Behavior: Behavior normal.     ED Results and Treatments Labs (all labs ordered are listed, but only abnormal results are displayed) Labs Reviewed  URINALYSIS, ROUTINE W REFLEX MICROSCOPIC - Abnormal; Notable for the following components:      Result Value   Protein, ur TRACE (*)    All other components within normal limits  COMPREHENSIVE METABOLIC PANEL - Abnormal; Notable for the following components:   Total Protein 8.3 (*)    All other components within normal limits  CK - Abnormal; Notable for the following components:   Total CK 349 (*)    All other components within normal limits  RESP PANEL BY RT-PCR (RSV, FLU A&B, COVID)  RVPGX2  CBC WITH DIFFERENTIAL/PLATELET  LIPASE, BLOOD  PREGNANCY, URINE  MAGNESIUM                                                                                                                           Radiology CT Chest Wo Contrast  Result Date: 02/13/2023 CLINICAL DATA:  New pulmonary nodule identified on CT of the abdomen pelvis same day. EXAM: CT CHEST WITHOUT CONTRAST TECHNIQUE: Multidetector CT imaging of the chest was performed following the standard protocol without IV contrast. RADIATION DOSE REDUCTION: This exam was performed according to the departmental dose-optimization program which includes automated exposure control, adjustment of the mA and/or kV according to patient size and/or use of iterative reconstruction technique. COMPARISON:  CT abdomen same day, CT abdomen 01/05/2022 FINDINGS: Cardiovascular: No significant vascular findings. Normal heart size. No pericardial effusion. Mediastinum/Nodes: Enlargement of the LEFT lobe of the thyroid gland 24.2 by 3.6 by 5.3 cm. Nodule enlargement extends into the upper thoracic inlet. (Image 16/2 and image 79/4) No mediastinal lymphadenopathy identified on noncontrast exam. No axillary adenopathy. Lungs/Pleura: Bilateral pulmonary nodules of varying size. Nodules are new from February 07/2022. Example nodule in the RIGHT upper lobe measures 11  mm on image 31/6. Nodule in the lingula measures 16 mm on image 65/6. Nodule in the LEFT lower lobe measures 11 mm on image 91/6. Nodule in the medial RIGHT lower lobe measures 11 mm (9/6) Upper Abdomen: Limited view of the liver, kidneys, pancreas are unremarkable. Normal adrenal glands. Musculoskeletal: No aggressive osseous lesion. IMPRESSION: 1. Rapid onset of pulmonary nodularity would favor infectious inflammatory etiology. Malignancy cannot be excluded. Recommend clinical correlation and consider bronchoscopy and tissue sampling versus short-term follow-up CT following appropriate therapy. 2. Nodule enlargement of the RIGHT lobe of the thyroid gland. Recommend thyroid ultrasound and potential biopsy to exclude  thyroid carcinoma. (ref: J Am Coll Radiol. 2015 Feb;12(2): 143-50). Electronically Signed   By: Suzy Bouchard M.D.   On: 02/13/2023 12:38   CT ABDOMEN PELVIS W CONTRAST  Result Date: 02/13/2023 CLINICAL DATA:  Right upper quadrant and epigastric pain for 1 month. Nephrolithiasis. EXAM: CT ABDOMEN AND PELVIS WITH CONTRAST TECHNIQUE: Multidetector CT imaging of the abdomen and pelvis was performed using the standard protocol following bolus administration of intravenous contrast. RADIATION DOSE REDUCTION: This exam was performed according to the departmental dose-optimization program which includes automated exposure control, adjustment of the mA and/or kV according to patient size and/or use of iterative reconstruction technique. CONTRAST:  43mL OMNIPAQUE IOHEXOL 300 MG/ML  SOLN COMPARISON:  01/05/2022 FINDINGS: Lower Chest: Numerous small pulmonary nodules are seen in both lung bases which are new since previous study. Largest in the lingula measures 1.3 cm. These are suspicious for pulmonary metastases. Hepatobiliary: No hepatic masses identified. Gallbladder is unremarkable. No evidence of biliary ductal dilatation. Pancreas:  No mass or inflammatory changes. Spleen: Within normal limits in size and appearance. Adrenals/Urinary Tract: No suspicious masses identified. No evidence of ureteral calculi or hydronephrosis. Unremarkable unopacified urinary bladder. Stomach/Bowel: No evidence of obstruction, inflammatory process or abnormal fluid collections. Normal appendix visualized. Right-sided colonic diverticulosis is again noted, without evidence of diverticulitis. Vascular/Lymphatic: Mild bilateral external iliac and inguinal lymphadenopathy is again seen which remains stable. No acute vascular findings. Reproductive: Probable small left-sided uterine fibroid noted. Adnexal regions are unremarkable. Other: Abnormal soft tissue density is again seen throughout the subcutaneous tissues of the buttocks  bilaterally, without significant change compared to multiple prior studies dating back to 2020. Musculoskeletal:  No suspicious bone lesions identified. IMPRESSION: No acute findings within the abdomen or pelvis. Stable abnormal soft tissue densities throughout the subcutaneous tissues of the buttocks. Stable mild bilateral external iliac and inguinal lymphadenopathy. Colonic diverticulosis, without radiographic evidence of diverticulitis. Probable small uterine fibroid. New small pulmonary nodules in both lung bases. Differential diagnosis includes pulmonary metastases and infectious or inflammatory etiologies. Recommend chest CT for further evaluation. Electronically Signed   By: Marlaine Hind M.D.   On: 02/13/2023 10:42   DG Cervical Spine Complete  Result Date: 02/13/2023 CLINICAL DATA:  Radiculopathy EXAM: CERVICAL SPINE - COMPLETE 4+ VIEW COMPARISON:  None Available. FINDINGS: No fracture or dislocation is seen. Reversal of lordosis may be due to positioning or muscle spasm. There is no disc space narrowing. There is no narrowing of neural foramina. Prevertebral soft tissues are unremarkable. IMPRESSION: No significant radiographic abnormalities are seen in cervical spine. Electronically Signed   By: Elmer Picker M.D.   On: 02/13/2023 09:06    Pertinent labs & imaging results that were available during my care of the patient were reviewed by me and considered in my medical decision making (see MDM for details).  Medications Ordered in ED Medications  sodium  chloride 0.9 % bolus 1,000 mL (1,000 mLs Intravenous New Bag/Given 02/13/23 0912)  ketorolac (TORADOL) 30 MG/ML injection 30 mg (30 mg Intravenous Given 02/13/23 0912)  alum & mag hydroxide-simeth (MAALOX/MYLANTA) 200-200-20 MG/5ML suspension 30 mL (30 mLs Oral Given 02/13/23 0914)    And  lidocaine (XYLOCAINE) 2 % viscous mouth solution 15 mL (15 mLs Oral Given 02/13/23 0914)  iohexol (OMNIPAQUE) 300 MG/ML solution 100 mL (85 mLs  Intravenous Contrast Given 02/13/23 1013)                                                                                                                                     Procedures Procedures  (including critical care time)  Medical Decision Making / ED Course    Medical Decision Making:    Connie Jenkins is a 42 y.o. female with past medical history as below, significant for IBS, PTSD, anxiety, asthma who presents to the ED with complaint of bodyaches, muscle cramping sensation of upper extremities, abdominal pain, malaise.  Dysuria.,  Frequency. The complaint involves an extensive differential diagnosis and also carries with it a high risk of complications and morbidity.  Serious etiology was considered. Ddx includes but is not limited to: Differential diagnosis includes but is not exclusive to acute cholecystitis, intrathoracic causes for epigastric abdominal pain, gastritis, duodenitis, pancreatitis, small bowel or large bowel obstruction, abdominal aortic aneurysm, hernia, gastritis, etc.   Complete initial physical exam performed, notably the patient  was no acute distress, resting comfortably, afebrile, ambient air.    Reviewed and confirmed nursing documentation for past medical history, family history, social history.  Vital signs reviewed.    Clinical Course as of 02/13/23 1332  Tue Feb 13, 2023  1117 CTAP with nodules at lung bases, radiology recommends CT thorax to differentiate  [SG]  1247 Pt with chills, myalgias over last month. Intermittent cough. CT with nodules infectious vs neoplasm, thyroid abnormality. She has no leukocytosis, not septic. Will cover with oral abx for ?pna and have her f/u with pulm for further eval of nodules/biopsy eval. PCP for eval of thyroid to help schedule [place] and referral for bx if needed.  [SG]    Clinical Course User Index [SG] Jeanell Sparrow, DO   Labs reviewed, CPK is mildly elevated, unclear etiology.  Renal function is stable.  No  hematuria.  Patient with abdominal lymphadenopathy on CT imaging, pulmonary and thyroid nodules noted on CT imaging of the chest.  Given constellation of B symptoms (elev CPK as well) there is elevated degree of suspicion for possible neoplastic process.  Patient to follow-up with pulmonology further evaluation of lung nodules and advised to follow-up with PCP in regards to thyroid nodule/thyroid imaging/ultrasound.  Will give azithromycin for possible pulmonary infectious process.   These findings were explained at length to patient at bedside.  She is feeling better after intervention.  All questions answered prior to discharge.  The  patient improved significantly and was discharged in stable condition. Detailed discussions were had with the patient regarding current findings, and need for close f/u with PCP or on call doctor. The patient has been instructed to return immediately if the symptoms worsen in any way for re-evaluation. Patient verbalized understanding and is in agreement with current care plan. All questions answered prior to discharge.    Additional history obtained: -Additional history obtained from na -External records from outside source obtained and reviewed including: Chart review including previous notes, labs, imaging, consultation notes including primary care documentation, prior labs and imaging, home medications Renal ultrasound 2/14 unremarkable CT abdomen pelvis 2/23 with 2 mm stone, mild cardiomegaly, otherwise stable   Lab Tests: -I ordered, reviewed, and interpreted labs.   The pertinent results include:   Labs Reviewed  URINALYSIS, ROUTINE W REFLEX MICROSCOPIC - Abnormal; Notable for the following components:      Result Value   Protein, ur TRACE (*)    All other components within normal limits  COMPREHENSIVE METABOLIC PANEL - Abnormal; Notable for the following components:   Total Protein 8.3 (*)    All other components within normal limits  CK - Abnormal;  Notable for the following components:   Total CK 349 (*)    All other components within normal limits  RESP PANEL BY RT-PCR (RSV, FLU A&B, COVID)  RVPGX2  CBC WITH DIFFERENTIAL/PLATELET  LIPASE, BLOOD  PREGNANCY, URINE  MAGNESIUM    Notable for elevated CPK  EKG   EKG Interpretation  Date/Time:    Ventricular Rate:    PR Interval:    QRS Duration:   QT Interval:    QTC Calculation:   R Axis:     Text Interpretation:           Imaging Studies ordered: I ordered imaging studies including CTAP., cervical xr I independently visualized the following imaging with scope of interpretation limited to determining acute life threatening conditions related to emergency care: lymphadenopathy, lung/thyroid nodules I independently visualized and interpreted imaging. I agree with the radiologist interpretation   Medicines ordered and prescription drug management: Meds ordered this encounter  Medications   sodium chloride 0.9 % bolus 1,000 mL   ketorolac (TORADOL) 30 MG/ML injection 30 mg   AND Linked Order Group    alum & mag hydroxide-simeth (MAALOX/MYLANTA) 200-200-20 MG/5ML suspension 30 mL    lidocaine (XYLOCAINE) 2 % viscous mouth solution 15 mL   iohexol (OMNIPAQUE) 300 MG/ML solution 100 mL   azithromycin (ZITHROMAX) 250 MG tablet    Sig: Take 1 tablet (250 mg total) by mouth daily. Take first 2 tablets together, then 1 every day until finished.    Dispense:  6 tablet    Refill:  0    -I have reviewed the patients home medicines and have made adjustments as needed   Consultations Obtained: na   Cardiac Monitoring: The patient was maintained on a cardiac monitor.  I personally viewed and interpreted the cardiac monitored which showed an underlying rhythm of: SR  Social Determinants of Health:  Diagnosis or treatment significantly limited by social determinants of health: former smoker   Reevaluation: After the interventions noted above, I reevaluated the patient  and found that they have improved  Co morbidities that complicate the patient evaluation  Past Medical History:  Diagnosis Date   Anxiety    no meds   Asthma    inhaler rarely used - twice year   Depression    no current Tx.  Dysuria 02/09/2021   Gestational diabetes    Headache    Herpes 2008   IBS (irritable bowel syndrome)    Renal stone 08/26/2014   Seasonal allergies       Dispostion: Disposition decision including need for hospitalization was considered, and patient discharged from emergency department.    Final Clinical Impression(s) / ED Diagnoses Final diagnoses:  Pulmonary nodule  Right thyroid nodule  Malaise  Uterine leiomyoma, unspecified location  Lymphadenopathy, abdominal     This chart was dictated using voice recognition software.  Despite best efforts to proofread,  errors can occur which can change the documentation meaning.    Wynona Dove A, DO 02/13/23 1332

## 2023-02-13 NOTE — Discharge Instructions (Addendum)
Please call pulmonologist office (Dr Valeta Harms) later on today or tomorrow to set up appointment for further evaluation of lung nodules.  Please call your primary care doctor to arrange for thyroid ultrasound and possible biopsy if needed of right sided thyroid nodule.  It was a pleasure caring for you today in the emergency department.  Please return to the emergency department for any worsening or worrisome symptoms.

## 2023-02-13 NOTE — ED Triage Notes (Signed)
"  I have been having chills, muscle aches, urinary frequency,  abdominal pain on and off x 1 month. In the past when I felt this way I had kidney stones. I went to the doctor and they saw blood in my urine, but no infection. I never followed back up or got a diagnosis. The problems have just continued" per pt Denies n/v/d

## 2023-02-15 ENCOUNTER — Encounter: Payer: Self-pay | Admitting: Student

## 2023-02-15 ENCOUNTER — Ambulatory Visit: Payer: Medicaid Other | Admitting: Student

## 2023-02-15 VITALS — BP 118/60 | HR 84 | Ht 62.0 in | Wt 223.0 lb

## 2023-02-15 DIAGNOSIS — E041 Nontoxic single thyroid nodule: Secondary | ICD-10-CM | POA: Diagnosis not present

## 2023-02-15 NOTE — Progress Notes (Signed)
  SUBJECTIVE:   CHIEF COMPLAINT / HPI:   Presents today for follow-up from being seen in ED yesterday for continued constellation of symptoms consisting of bodyaches, muscle cramping sensation, abdominal pain, malaise, dysuria and frequency.  Previous urine testing, CBC, metabolic panel were negative.  This was further confirmed in the ED however imaging revealed small pulmonary nodules in both lung bases in addition to nodule enlargement of the left lobe of thyroid gland.  Recommendations include and report were thyroid ultrasound and potential biopsy to exclude thyroid carcinoma.  She does have pulmonology appointment for 02/20/2023.  PERTINENT  PMH / PSH: Anxiety, asthma, IBS, allergies, GDM, vertigo  Patient Care Team: Wells Guiles, DO as PCP - General (Family Medicine) OBJECTIVE:  BP 118/60   Pulse 84   Ht 5\' 2"  (1.575 m)   Wt 223 lb (101.2 kg)   SpO2 96%   BMI 40.79 kg/m  General: Well-appearing, NAD HEENT: Nonspecific thyromegaly of left lobe without appreciable border CV: RRR, no murmurs auscultated Pulm: CTAB, normal WOB  ASSESSMENT/PLAN:  Thyroid nodule Assessment & Plan: Enlargement of the LEFT lobe of the thyroid gland 24.2 by 3.6 by 5.3 cm on CT chest 02/13/23. Recommend thyroid ultrasound and potentially bx to exclude malignancy.  Scheduled today and will also do lab testing.  Orders: -     US THYROID; Future -     TSH + free T4  Return if symptoms worsen or fail to improve. Wells Guiles, DO 02/15/2023, 4:35 PM PGY-2, [place]

## 2023-02-15 NOTE — Patient Instructions (Signed)
It was great to see you today! Thank you for choosing [facility] for your primary care. Connie Jenkins was seen for ED follow-up.  Today we addressed: We get you scheduled for the thyroid ultrasound and we will get some thyroid labs tested today.  If you haven't already, sign up for My Chart to have easy access to your labs results, and communication with your primary care physician.  You should return to our clinic Return if symptoms worsen or fail to improve. Please arrive 15 minutes before your appointment to ensure smooth check in process.  We appreciate your efforts in making this happen.  Thank you for allowing me to participate in your care, Wells Guiles, DO 02/15/2023, 4:20 PM PGY-2, [place]

## 2023-02-15 NOTE — Assessment & Plan Note (Addendum)
Enlargement of the LEFT lobe of the thyroid gland 24.2 by 3.6 by 5.3 cm on CT chest 02/13/23. Recommend thyroid ultrasound and potentially bx to exclude malignancy.  Scheduled today and will also do lab testing.

## 2023-02-16 LAB — TSH+FREE T4
Free T4: 0.99 ng/dL (ref 0.82–1.77)
TSH: 4.72 u[IU]/mL — ABNORMAL HIGH (ref 0.450–4.500)

## 2023-02-20 ENCOUNTER — Encounter: Payer: Self-pay | Admitting: Emergency Medicine

## 2023-02-20 ENCOUNTER — Ambulatory Visit: Payer: Medicaid Other | Admitting: Emergency Medicine

## 2023-02-20 VITALS — BP 128/80 | HR 82 | Temp 99.4°F | Ht 62.0 in | Wt 223.4 lb

## 2023-02-20 DIAGNOSIS — R918 Other nonspecific abnormal finding of lung field: Secondary | ICD-10-CM | POA: Insufficient documentation

## 2023-02-20 NOTE — Progress Notes (Signed)
Subjective:    Patient ID: Connie Jenkins, female    DOB: 1981/09/07, 42 y.o.   MRN: [id number]  HPI  42 year old woman with a minimal tobacco history, asthma with seasonal allergies, anxiety/depression, IBS.  She is referred today for evaluation of an abnormal CT scan of the chest.  She was in the ED 02/13/2023 for right upper quadrant pain.  As part of this evaluation she had CT scans of the chest abdomen pelvis.  No intra-abdominal abnormality was noted but there were bilateral scattered pulmonary nodules concerning for either malignancy or inflammatory disease.  There was also a large thyroid nodule in the left lobe of the thyroid.  A thyroid ultrasound has been planned for 02/22/2023 She has been having cough since November, some progressive dyspnea.  She is having L pleuritic pain  CT chest 02/13/2023 reviewed by me shows enlargement of the left lobe of the thyroid gland 2.4 x 3.6 x 5.3 cm that extends into the upper thoracic inlet.  There is no mediastinal or hilar adenopathy.  There are scattered bilateral pulmonary nodules that are new compared with 12/2021.  Includes 11 mm right upper lobe nodule, 16 mm lingular nodule, 11 mm left lower lobe nodule, 11 mm medial right lower lobe nodule.  No other abnormalities noted.  CT abdomen and pelvis showed no acute findings in the abdomen, new small bilateral pulmonary nodules of unclear etiology   Review of Systems As per HPI  Past Medical History:  Diagnosis Date   Anxiety    no meds   Asthma    inhaler rarely used - twice year   Depression    no current Tx.   Dysuria 02/09/2021   Gestational diabetes    Headache    Herpes 2008   IBS (irritable bowel syndrome)    Renal stone 08/26/2014   Seasonal allergies      Family History  Problem Relation Age of Onset   Hypertension Mother    Diabetes Mother      Social History   Socioeconomic History   Marital status: Single    Spouse name: Not on file   Number of children: Not on  file   Years of education: Not on file   Highest education level: Not on file  Occupational History   Not on file  Tobacco Use   Smoking status: Former    Types: Cigarettes    Passive exposure: Never   Smokeless tobacco: Never   Tobacco comments:    Pt smoked for 8 months. ARJ 02/20/23  Vaping Use   Vaping Use: Never used  Substance and Sexual Activity   Alcohol use: Not Currently    Alcohol/week: 1.0 standard drink of alcohol    Types: 1 Standard drinks or equivalent per week    Comment: occasionally   Drug use: No   Sexual activity: Yes    Partners: Male    Birth control/protection: None  Other Topics Concern   Not on file  Social History Narrative   Not on file   Social Determinants of Health   Financial Resource Strain: Not on file  Food Insecurity: Not on file  Transportation Needs: Not on file  Physical Activity: Not on file  Stress: Not on file  Social Connections: Not on file  Intimate Partner Violence: Not on file    She is in school, also works in deliveries She is worked in [employer] before, no other exposures except for dust Negative TB test 1 year ago  No [place] native  Allergies  Allergen Reactions   Flagyl [Metronidazole] Nausea And Vomiting   Latex Itching and Swelling   Penicillins Hives, Other (See Comments) and Rash    Childhood allergy Has patient had a PCN reaction causing immediate rash, facial/tongue/throat swelling, SOB or lightheadedness with hypotension: unknown Has patient had a PCN reaction causing severe rash involving mucus membranes or skin necrosis: unknown Has patient had a PCN reaction that required hospitalization unknown Has patient had a PCN reaction occurring within the last 10 years: no If all of the above answers are "NO", then may proceed with Cephalosporin use.   Childhood allergy Has patient had a PCN reaction causing immediate rash, facial/tongue/throat swelling, SOB or lightheadedness with  hypotension: unknown Has patient had a PCN reaction causing severe rash involving mucus membranes or skin necrosis: unknown Has patient had a PCN reaction that required hospitalization unknown Has patient had a PCN reaction occurring within the last 10 years: no If all of the above answers are "NO", then may proceed with Cephalosporin use.     Outpatient Medications Prior to Visit  Medication Sig Dispense Refill   azithromycin (ZITHROMAX) 250 MG tablet Take 1 tablet (250 mg total) by mouth daily. Take first 2 tablets together, then 1 every day until finished. 6 tablet 0   cetirizine (ZYRTEC ALLERGY) 10 MG tablet Take 1 tablet (10 mg total) by mouth daily. (Patient not taking: Reported on 02/20/2023) 30 tablet 3   escitalopram (LEXAPRO) 10 MG tablet Take 1 tablet (10 mg total) by mouth daily. 30 tablet 3   fluticasone (FLONASE) 50 MCG/ACT nasal spray Place 2 sprays into both nostrils daily. (Patient not taking: Reported on 02/20/2023) 16 g 6   tamsulosin (FLOMAX) 0.4 MG CAPS capsule Take 1 capsule (0.4 mg total) by mouth daily. (Patient not taking: Reported on 02/20/2023) 30 capsule 3   Vitamin D, Ergocalciferol, (DRISDOL) 1.25 MG (50000 UNIT) CAPS capsule Take 1 capsule (50,000 Units total) by mouth every 7 (seven) days. (Patient not taking: Reported on 02/20/2023) 8 capsule 0   No facility-administered medications prior to visit.        Objective:   Physical Exam Vitals:   02/20/23 1504  BP: 128/80  Pulse: 82  Temp: 99.4 F (37.4 C)  TempSrc: Oral  SpO2: 99%  Weight: 223 lb 6.4 oz (101.3 kg)  Height: 5\' 2"  (1.575 m)   Gen: Pleasant, well-nourished, in no distress,  normal affect  ENT: No lesions,  mouth clear,  oropharynx clear, no postnasal drip  Neck: No JVD, no stridor  Lungs: No use of accessory muscles, no crackles or wheezing on normal respiration, no wheeze on forced expiration  Cardiovascular: RRR, heart sounds normal, no murmur or gallops, no peripheral  edema  Musculoskeletal: No deformities, no cyanosis or clubbing  Neuro: alert, awake, non focal  Skin: Warm, no lesions or rash      Assessment & Plan:   Pulmonary nodules Scattered rounded pulmonary nodules, concerning for possible malignancy particularly given her newly identified enlargement of her left thyroid.  A thyroid ultrasound has been planned.  I think she needs navigational bronchoscopy as soon as possible to better characterize the nodules, obtain cytology and culture data.  She understands and agrees.  Will try to get this set up for 03/05/2023.   Baltazar Apo, MD, PhD 02/20/2023, 3:42 PM [facility] [phone] or if no answer before 7:00PM call [phone] For any issues after 7:00PM please call eLink [phone]

## 2023-02-20 NOTE — H&P (View-Only) (Signed)
 Subjective:    Patient ID: Connie Jenkins, female    DOB: 11/04/1981, 41 y.o.   MRN: [id number]  HPI  41-year-old woman with a minimal tobacco history, asthma with seasonal allergies, anxiety/depression, IBS.  She is referred today for evaluation of an abnormal CT scan of the chest.  She was in the ED 02/13/2023 for right upper quadrant pain.  As part of this evaluation she had CT scans of the chest abdomen pelvis.  No intra-abdominal abnormality was noted but there were bilateral scattered pulmonary nodules concerning for either malignancy or inflammatory disease.  There was also a large thyroid nodule in the left lobe of the thyroid.  A thyroid ultrasound has been planned for 02/22/2023 She has been having cough since November, some progressive dyspnea.  She is having L pleuritic pain  CT chest 02/13/2023 reviewed by me shows enlargement of the left lobe of the thyroid gland 2.4 x 3.6 x 5.3 cm that extends into the upper thoracic inlet.  There is no mediastinal or hilar adenopathy.  There are scattered bilateral pulmonary nodules that are new compared with 12/2021.  Includes 11 mm right upper lobe nodule, 16 mm lingular nodule, 11 mm left lower lobe nodule, 11 mm medial right lower lobe nodule.  No other abnormalities noted.  CT abdomen and pelvis showed no acute findings in the abdomen, new small bilateral pulmonary nodules of unclear etiology   Review of Systems As per HPI  Past Medical History:  Diagnosis Date   Anxiety    no meds   Asthma    inhaler rarely used - twice year   Depression    no current Tx.   Dysuria 02/09/2021   Gestational diabetes    Headache    Herpes 2008   IBS (irritable bowel syndrome)    Renal stone 08/26/2014   Seasonal allergies      Family History  Problem Relation Age of Onset   Hypertension Mother    Diabetes Mother      Social History   Socioeconomic History   Marital status: Single    Spouse name: Not on file   Number of children: Not on  file   Years of education: Not on file   Highest education level: Not on file  Occupational History   Not on file  Tobacco Use   Smoking status: Former    Types: Cigarettes    Passive exposure: Never   Smokeless tobacco: Never   Tobacco comments:    Pt smoked for 8 months. ARJ 02/20/23  Vaping Use   Vaping Use: Never used  Substance and Sexual Activity   Alcohol use: Not Currently    Alcohol/week: 1.0 standard drink of alcohol    Types: 1 Standard drinks or equivalent per week    Comment: occasionally   Drug use: No   Sexual activity: Yes    Partners: Male    Birth control/protection: None  Other Topics Concern   Not on file  Social History Narrative   Not on file   Social Determinants of Health   Financial Resource Strain: Not on file  Food Insecurity: Not on file  Transportation Needs: Not on file  Physical Activity: Not on file  Stress: Not on file  Social Connections: Not on file  Intimate Partner Violence: Not on file    She is in school, 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, no other exposures except for dust Negative TB test 1 year ago   No [employer] [place] native  Allergies  Allergen Reactions   Flagyl [Metronidazole] Nausea And Vomiting   Latex Itching and Swelling   Penicillins Hives, Other (See Comments) and Rash    Childhood allergy Has patient had a PCN reaction causing immediate rash, facial/tongue/throat swelling, SOB or lightheadedness with hypotension: unknown Has patient had a PCN reaction causing severe rash involving mucus membranes or skin necrosis: unknown Has patient had a PCN reaction that required hospitalization unknown Has patient had a PCN reaction occurring within the last 10 years: no If all of the above answers are "NO", then may proceed with Cephalosporin use.   Childhood allergy Has patient had a PCN reaction causing immediate rash, facial/tongue/throat swelling, SOB or lightheadedness with  hypotension: unknown Has patient had a PCN reaction causing severe rash involving mucus membranes or skin necrosis: unknown Has patient had a PCN reaction that required hospitalization unknown Has patient had a PCN reaction occurring within the last 10 years: no If all of the above answers are "NO", then may proceed with Cephalosporin use.     Outpatient Medications Prior to Visit  Medication Sig Dispense Refill   azithromycin (ZITHROMAX) 250 MG tablet Take 1 tablet (250 mg total) by mouth daily. Take first 2 tablets together, then 1 every day until finished. 6 tablet 0   cetirizine (ZYRTEC ALLERGY) 10 MG tablet Take 1 tablet (10 mg total) by mouth daily. (Patient not taking: Reported on 02/20/2023) 30 tablet 3   escitalopram (LEXAPRO) 10 MG tablet Take 1 tablet (10 mg total) by mouth daily. 30 tablet 3   fluticasone (FLONASE) 50 MCG/ACT nasal spray Place 2 sprays into both nostrils daily. (Patient not taking: Reported on 02/20/2023) 16 g 6   tamsulosin (FLOMAX) 0.4 MG CAPS capsule Take 1 capsule (0.4 mg total) by mouth daily. (Patient not taking: Reported on 02/20/2023) 30 capsule 3   Vitamin D, Ergocalciferol, (DRISDOL) 1.25 MG (50000 UNIT) CAPS capsule Take 1 capsule (50,000 Units total) by mouth every 7 (seven) days. (Patient not taking: Reported on 02/20/2023) 8 capsule 0   No facility-administered medications prior to visit.        Objective:   Physical Exam Vitals:   02/20/23 1504  BP: 128/80  Pulse: 82  Temp: 99.4 F (37.4 C)  TempSrc: Oral  SpO2: 99%  Weight: 223 lb 6.4 oz (101.3 kg)  Height: 5' 2" (1.575 m)   Gen: Pleasant, well-nourished, in no distress,  normal affect  ENT: No lesions,  mouth clear,  oropharynx clear, no postnasal drip  Neck: No JVD, no stridor  Lungs: No use of accessory muscles, no crackles or wheezing on normal respiration, no wheeze on forced expiration  Cardiovascular: RRR, heart sounds normal, no murmur or gallops, no peripheral  edema  Musculoskeletal: No deformities, no cyanosis or clubbing  Neuro: alert, awake, non focal  Skin: Warm, no lesions or rash      Assessment & Plan:   Pulmonary nodules Scattered rounded pulmonary nodules, concerning for possible malignancy particularly given her newly identified enlargement of her left thyroid.  A thyroid ultrasound has been planned.  I think she needs navigational bronchoscopy as soon as possible to better characterize the nodules, obtain cytology and culture data.  She understands and agrees.  Will try to get this set up for 03/05/2023.   Ellyse Rotolo, MD, PhD 02/20/2023, 3:42 PM [facility] [phone] or if no answer before 7:00PM call [phone] For any issues after 7:00PM please call eLink [phone]  

## 2023-02-20 NOTE — Assessment & Plan Note (Signed)
Scattered rounded pulmonary nodules, concerning for possible malignancy particularly given her newly identified enlargement of her left thyroid.  A thyroid ultrasound has been planned.  I think she needs navigational bronchoscopy as soon as possible to better characterize the nodules, obtain cytology and culture data.  She understands and agrees.  Will try to get this set up for 03/05/2023.

## 2023-02-20 NOTE — Patient Instructions (Addendum)
We reviewed your CT scan of the chest today. Agree with getting your thyroid ultrasound.  You may ultimately need a thyroid biopsy depending on that result We will arrange for navigational bronchoscopy to evaluate pulmonary nodules.  This will be done under general anesthesia as an outpatient at [facility] endoscopy.  You will need a designated driver.  We will try to get this set up for 03/05/2023. Follow Dr. Lamonte Sakai in 1 month or next available

## 2023-02-22 ENCOUNTER — Telehealth: Payer: Self-pay | Admitting: Student

## 2023-02-22 ENCOUNTER — Ambulatory Visit (HOSPITAL_COMMUNITY)
Admission: RE | Admit: 2023-02-22 | Discharge: 2023-02-22 | Disposition: A | Discharge status: Home / Self Care | Payer: Medicaid Other | Source: Ambulatory Visit | Attending: Family Medicine | Admitting: Family Medicine

## 2023-02-22 DIAGNOSIS — E041 Nontoxic single thyroid nodule: Secondary | ICD-10-CM | POA: Diagnosis not present

## 2023-02-22 NOTE — Telephone Encounter (Signed)
Called to discuss recommendation for biopsy of thyroid nodule.  Will submit order and send to team for scheduling.

## 2023-02-26 ENCOUNTER — Telehealth: Payer: Self-pay

## 2023-02-26 NOTE — Telephone Encounter (Signed)
Called Connie Jenkins at Centralized scheduling and she informed me that she would pass on the request for the Thyroid Nodule Biopsy to the proper office and they will call patient to schedule.  Ozella Almond, [place]

## 2023-02-28 ENCOUNTER — Ambulatory Visit
Admission: RE | Admit: 2023-02-28 | Discharge: 2023-02-28 | Disposition: A | Discharge status: Home / Self Care | Payer: Medicaid Other | Source: Ambulatory Visit | Attending: Family Medicine | Admitting: Family Medicine

## 2023-02-28 ENCOUNTER — Other Ambulatory Visit (HOSPITAL_COMMUNITY)
Admission: RE | Admit: 2023-02-28 | Discharge: 2023-02-28 | Disposition: A | Discharge status: Home / Self Care | Payer: Medicaid Other | Source: Ambulatory Visit | Attending: Radiology | Admitting: Radiology

## 2023-02-28 ENCOUNTER — Encounter (HOSPITAL_COMMUNITY): Payer: Self-pay | Admitting: Emergency Medicine

## 2023-02-28 DIAGNOSIS — E041 Nontoxic single thyroid nodule: Secondary | ICD-10-CM | POA: Diagnosis not present

## 2023-02-28 NOTE — Progress Notes (Signed)
PCP - Wells Guiles, DO Cardiologist - n/a  CT Chest x-ray - 02/13/23 EKG - n/a Stress Test - n/a ECHO - 02/16/11 Cardiac Cath - n/a  ICD Pacemaker/Loop - n/a  Sleep Study -  n/a CPAP - none  Diabetes - Gestational - resolved after delivery  NPO  STOP now taking any Aspirin (unless otherwise instructed by your surgeon), Aleve, Naproxen, Ibuprofen, Motrin, Advil, Goody's, BC's, all herbal medications, fish oil, and all vitamins.   Coronavirus Screening Do you have any of the following symptoms:  Cough yes/no: Yes Fever (>100.61F)  yes/no: No Runny nose yes/no: No Sore throat yes/no: No Difficulty breathing/shortness of breath  yes/no: Yes  Have you traveled in the last 14 days and where? yes/no: No  Patient verbalized understanding of instructions that were given via phone.

## 2023-03-01 ENCOUNTER — Other Ambulatory Visit: Payer: Medicaid Other

## 2023-03-01 DIAGNOSIS — R918 Other nonspecific abnormal finding of lung field: Secondary | ICD-10-CM

## 2023-03-02 LAB — SPECIMEN STATUS REPORT

## 2023-03-02 LAB — CYTOLOGY - NON PAP

## 2023-03-02 LAB — NOVEL CORONAVIRUS, NAA: SARS-CoV-2, NAA: NOT DETECTED

## 2023-03-05 ENCOUNTER — Ambulatory Visit (HOSPITAL_BASED_OUTPATIENT_CLINIC_OR_DEPARTMENT_OTHER): Payer: Medicaid Other | Admitting: Anesthesiology

## 2023-03-05 ENCOUNTER — Ambulatory Visit (HOSPITAL_COMMUNITY): Payer: Medicaid Other | Admitting: Anesthesiology

## 2023-03-05 ENCOUNTER — Ambulatory Visit (HOSPITAL_COMMUNITY): Payer: Medicaid Other

## 2023-03-05 ENCOUNTER — Ambulatory Visit (HOSPITAL_COMMUNITY)
Admission: RE | Admit: 2023-03-05 | Discharge: 2023-03-05 | Disposition: A | Discharge status: Home / Self Care | Payer: Medicaid Other | Attending: Emergency Medicine | Admitting: Emergency Medicine

## 2023-03-05 ENCOUNTER — Encounter (HOSPITAL_COMMUNITY)
Admission: RE | Disposition: A | Discharge status: Home / Self Care | Payer: Self-pay | Source: Home / Self Care | Attending: Emergency Medicine

## 2023-03-05 ENCOUNTER — Encounter (HOSPITAL_COMMUNITY): Payer: Self-pay | Admitting: Emergency Medicine

## 2023-03-05 DIAGNOSIS — Z87891 Personal history of nicotine dependence: Secondary | ICD-10-CM | POA: Diagnosis not present

## 2023-03-05 DIAGNOSIS — Z8632 Personal history of gestational diabetes: Secondary | ICD-10-CM | POA: Diagnosis not present

## 2023-03-05 DIAGNOSIS — R079 Chest pain, unspecified: Secondary | ICD-10-CM | POA: Diagnosis not present

## 2023-03-05 DIAGNOSIS — J45909 Unspecified asthma, uncomplicated: Secondary | ICD-10-CM | POA: Diagnosis not present

## 2023-03-05 DIAGNOSIS — R918 Other nonspecific abnormal finding of lung field: Secondary | ICD-10-CM | POA: Insufficient documentation

## 2023-03-05 HISTORY — PX: BRONCHIAL BRUSHINGS: SHX5108

## 2023-03-05 HISTORY — DX: Dermatitis, unspecified: L30.9

## 2023-03-05 HISTORY — DX: Pneumonia, unspecified organism: J18.9

## 2023-03-05 HISTORY — PX: VIDEO BRONCHOSCOPY WITH RADIAL ENDOBRONCHIAL ULTRASOUND: SHX6849

## 2023-03-05 HISTORY — PX: BRONCHIAL BIOPSY: SHX5109

## 2023-03-05 HISTORY — DX: Personal history of urinary calculi: Z87.442

## 2023-03-05 HISTORY — PX: BRONCHIAL WASHINGS: SHX5105

## 2023-03-05 LAB — ANAEROBIC CULTURE W GRAM STAIN

## 2023-03-05 LAB — CULTURE, RESPIRATORY W GRAM STAIN

## 2023-03-05 LAB — URINALYSIS, ROUTINE W REFLEX MICROSCOPIC
Bilirubin Urine: NEGATIVE
Glucose, UA: NEGATIVE mg/dL
Ketones, ur: NEGATIVE mg/dL
Leukocytes,Ua: NEGATIVE
Nitrite: NEGATIVE
Protein, ur: NEGATIVE mg/dL
Specific Gravity, Urine: 1.02 (ref 1.005–1.030)
pH: 5 (ref 5.0–8.0)

## 2023-03-05 LAB — GLUCOSE, CAPILLARY
Glucose-Capillary: 100 mg/dL — ABNORMAL HIGH (ref 70–99)
Glucose-Capillary: 98 mg/dL (ref 70–99)

## 2023-03-05 LAB — CULTURE, BAL-QUANTITATIVE W GRAM STAIN

## 2023-03-05 LAB — POCT PREGNANCY, URINE: Preg Test, Ur: NEGATIVE

## 2023-03-05 SURGERY — BRONCHOSCOPY, WITH BIOPSY USING ELECTROMAGNETIC NAVIGATION
Anesthesia: General | Laterality: Bilateral

## 2023-03-05 MED ORDER — ONDANSETRON HCL 4 MG/2ML IJ SOLN: 4.0000 mg | Freq: Four times a day (QID) | INTRAMUSCULAR | Status: DC | PRN

## 2023-03-05 MED ORDER — SUGAMMADEX SODIUM 200 MG/2ML IV SOLN
INTRAVENOUS | Status: DC | PRN
  Administered 2023-03-05: 200 mg via INTRAVENOUS

## 2023-03-05 MED ORDER — ROCURONIUM BROMIDE 10 MG/ML (PF) SYRINGE
PREFILLED_SYRINGE | INTRAVENOUS | Status: DC | PRN
  Administered 2023-03-05 (×2): 10 mg via INTRAVENOUS
  Administered 2023-03-05: 60 mg via INTRAVENOUS
  Administered 2023-03-05: 30 mg via INTRAVENOUS

## 2023-03-05 MED ORDER — ONDANSETRON HCL 4 MG/2ML IJ SOLN
INTRAMUSCULAR | Status: DC | PRN
  Administered 2023-03-05: 4 mg via INTRAVENOUS

## 2023-03-05 MED ORDER — FENTANYL CITRATE (PF) 100 MCG/2ML IJ SOLN: 25.0000 ug | INTRAMUSCULAR | Status: DC | PRN

## 2023-03-05 MED ORDER — PROPOFOL 500 MG/50ML IV EMUL
INTRAVENOUS | Status: DC | PRN
  Administered 2023-03-05: 100 ug/kg/min via INTRAVENOUS
  Administered 2023-03-05: 115 ug/kg/min via INTRAVENOUS

## 2023-03-05 MED ORDER — PROPOFOL 10 MG/ML IV BOLUS
INTRAVENOUS | Status: DC | PRN
  Administered 2023-03-05 (×3): 20 mg via INTRAVENOUS
  Administered 2023-03-05: 200 mg via INTRAVENOUS

## 2023-03-05 MED ORDER — LIDOCAINE 2% (20 MG/ML) 5 ML SYRINGE
INTRAMUSCULAR | Status: DC | PRN
  Administered 2023-03-05: 60 mg via INTRAVENOUS

## 2023-03-05 MED ORDER — FENTANYL CITRATE (PF) 100 MCG/2ML IJ SOLN
INTRAMUSCULAR | Status: DC | PRN
  Administered 2023-03-05: 100 ug via INTRAVENOUS

## 2023-03-05 MED ORDER — CHLORHEXIDINE GLUCONATE 0.12 % MT SOLN
OROMUCOSAL | Status: AC
  Filled 2023-03-05: qty 15

## 2023-03-05 MED ORDER — OXYCODONE HCL 5 MG/5ML PO SOLN: 5.0000 mg | Freq: Once | ORAL | Status: DC | PRN

## 2023-03-05 MED ORDER — MIDAZOLAM HCL 2 MG/2ML IJ SOLN
INTRAMUSCULAR | Status: DC | PRN
  Administered 2023-03-05: 2 mg via INTRAVENOUS

## 2023-03-05 MED ORDER — DEXAMETHASONE SODIUM PHOSPHATE 10 MG/ML IJ SOLN
INTRAMUSCULAR | Status: DC | PRN
  Administered 2023-03-05: 10 mg via INTRAVENOUS

## 2023-03-05 MED ORDER — OXYCODONE HCL 5 MG PO TABS: 5.0000 mg | ORAL_TABLET | Freq: Once | ORAL | Status: DC | PRN

## 2023-03-05 MED ORDER — LACTATED RINGERS IV SOLN: INTRAVENOUS | Status: DC

## 2023-03-05 NOTE — Anesthesia Postprocedure Evaluation (Signed)
Anesthesia Post Note  Patient: Connie Jenkins  Procedure(s) Performed: ROBOTIC ASSISTED NAVIGATIONAL BRONCHOSCOPY (Bilateral) VIDEO BRONCHOSCOPY WITH RADIAL ENDOBRONCHIAL ULTRASOUND BRONCHIAL BRUSHINGS BRONCHIAL BIOPSIES BRONCHIAL WASHINGS     Patient location during evaluation: PACU Anesthesia Type: General Level of consciousness: awake and alert Pain management: pain level controlled Vital Signs Assessment: post-procedure vital signs reviewed and stable Respiratory status: spontaneous breathing, nonlabored ventilation, respiratory function stable and patient connected to nasal cannula oxygen Cardiovascular status: blood pressure returned to baseline and stable Postop Assessment: no apparent nausea or vomiting Anesthetic complications: no   No notable events documented.  Last Vitals:  Vitals:   03/05/23 1215 03/05/23 1230  BP: 117/77 115/72  Pulse: 86 85  Resp: 20 19  Temp:  36.7 C  SpO2: 97% 94%    Last Pain:  Vitals:   03/05/23 1230  TempSrc:   PainSc: 0-No pain                 Mathias Bogacki S

## 2023-03-05 NOTE — Op Note (Signed)
Video Bronchoscopy with Robotic Assisted Bronchoscopic Navigation   Date of Operation: 03/05/2023   Pre-op Diagnosis: Multiple bilateral pulmonary nodules  Post-op Diagnosis: Same  Surgeon: Levy Pupa  Assistants: None  Anesthesia: General endotracheal anesthesia  Operation: Flexible video fiberoptic bronchoscopy with robotic assistance and biopsies.  Estimated Blood Loss: Minimal  Complications: None  Indications and History: Connie Jenkins is a 42 y.o. female with history of minimal tobacco, asthma.  She was found to have bilateral rounded pulmonary nodules on CT scan of the chest done to evaluate right upper quadrant and lower chest pain.  Recommendation made to achieve a tissue diagnosis and culture data via robotic assisted navigational bronchoscopy. The risks, benefits, complications, treatment options and expected outcomes were discussed with the patient.  The possibilities of pneumothorax, pneumonia, reaction to medication, pulmonary aspiration, perforation of a viscus, bleeding, failure to diagnose a condition and creating a complication requiring transfusion or operation were discussed with the patient who freely signed the consent.    Description of Procedure: The patient was seen in the Preoperative Area, was examined and was deemed appropriate to proceed.  The patient was taken to [facility] endoscopy room 3, identified as Connie Jenkins and the procedure verified as Flexible Video Fiberoptic Bronchoscopy.  A Time Out was held and the above information confirmed.   Prior to the date of the procedure a high-resolution CT scan of the chest was performed. Utilizing ION software program a virtual tracheobronchial tree was generated to allow the creation of distinct navigation pathways to the patient's parenchymal abnormalities. After being taken to the operating room general anesthesia was initiated and the patient  was orally intubated. The video fiberoptic bronchoscope was introduced  via the endotracheal tube and a general inspection was performed which showed normal right and left lung anatomy. Aspiration of the bilateral mainstems was completed to remove any remaining secretions. Robotic catheter inserted into patient's endotracheal tube.   Target #1 left upper lobe nodule: The distinct navigation pathways prepared prior to this procedure were then utilized to navigate to patient's lesion identified on CT scan. The robotic catheter was secured into place and the vision probe was withdrawn.  Lesion location was approximated using fluoroscopy.  Local registration and targeting was performed using CIOs three-dimensional imaging.  Under fluoroscopic guidance transbronchial brushings and transbronchial forceps biopsies were performed to be sent for cytology and pathology. A bronchioalveolar lavage was performed in the left upper lobe and sent for cytology and microbiology.  Target #2 left upper lobe nodule: The distinct navigation pathways prepared prior to this procedure were then utilized to navigate to patient's lesion identified on CT scan. The robotic catheter was secured into place and the vision probe was withdrawn.  Lesion location was approximated using fluoroscopy for peripheral targeting. Local registration and targeting was performed using CIOs three-dimensional imaging. Under fluoroscopic guidance transbronchial needle brushings were performed to be sent for cytology.   Target #3 right upper lobe nodule: The distinct navigation pathways prepared prior to this procedure were then utilized to navigate to patient's lesion identified on CT scan. The robotic catheter was secured into place and the vision probe was withdrawn.  Lesion location was approximated using fluoroscopy for peripheral targeting. Local registration and targeting was performed using CIOs three-dimensional imaging. Under fluoroscopic guidance transbronchial brushings were performed for cytology and pathology.    Target #5 right upper lobe nodule: The distinct navigation pathways prepared prior to this procedure were then utilized to navigate to patient's lesion identified on CT  scan. The robotic catheter was secured into place and the vision probe was withdrawn.  Lesion location was approximated using fluoroscopy for peripheral targeting. Local registration and targeting was performed using CIOs three-dimensional imaging. Under fluoroscopic guidance transbronchial brushings and transbronchial forceps biopsies were performed to be sent for cytology and pathology. A bronchioalveolar lavage was performed in the right upper lobe and sent for cytology and microbiology.    At the end of the procedure a general airway inspection was performed and there was no evidence of active bleeding. The bronchoscope was removed.  The patient tolerated the procedure well. There was no significant blood loss and there were no obvious complications. A post-procedural chest x-ray is pending.  Samples Target #1: 1. Transbronchial brushings from left upper lobe nodule 2. Transbronchial forceps biopsies from left upper lobe nodule 3. Bronchoalveolar lavage from left upper lobe  Samples Target #2: 1. Transbronchial brushings from left upper lobe nodule  Samples Target #3: 1. Transbronchial brushings from right upper lobe nodule  Samples Target #5: 1. Transbronchial brushings from right upper lobe nodule 2. Transbronchial forceps biopsies from right upper lobe nodule 3. Bronchoalveolar lavage from right upper lobe nodule   Plans:  The patient will be discharged from the PACU to home when recovered from anesthesia and after chest x-ray is reviewed. We will review the cytology, pathology and microbiology results with the patient when they become available. Outpatient followup will be with Dr. Delton Coombes.    Levy Pupa, MD, PhD 03/05/2023, 11:36 AM [facility] [phone] or if no answer before  7:00PM call [phone] For any issues after 7:00PM please call eLink [phone]

## 2023-03-05 NOTE — Transfer of Care (Signed)
Immediate Anesthesia Transfer of Care Note  Patient: Connie Jenkins  Procedure(s) Performed: ROBOTIC ASSISTED NAVIGATIONAL BRONCHOSCOPY (Bilateral) VIDEO BRONCHOSCOPY WITH RADIAL ENDOBRONCHIAL ULTRASOUND BRONCHIAL BRUSHINGS BRONCHIAL BIOPSIES BRONCHIAL WASHINGS  Patient Location: PACU  Anesthesia Type:General  Level of Consciousness: awake, alert , and oriented  Airway & Oxygen Therapy: Patient Spontanous Breathing  Post-op Assessment: Report given to RN, Post -op Vital signs reviewed and stable, and Patient moving all extremities X 4  Post vital signs: Reviewed and stable  Last Vitals:  Vitals Value Taken Time  BP 114/72 03/05/23 1127  Temp    Pulse 94 03/05/23 1128  Resp 23 03/05/23 1128  SpO2 91 % 03/05/23 1128  Vitals shown include unvalidated device data.  Last Pain:  Vitals:   03/05/23 0743  TempSrc:   PainSc: 0-No pain         Complications: No notable events documented.

## 2023-03-05 NOTE — Anesthesia Preprocedure Evaluation (Signed)
Anesthesia Evaluation  Patient identified by MRN, date of birth, ID band Patient awake    Reviewed: Allergy & Precautions, H&P , NPO status , Patient's Chart, lab work & pertinent test results  Airway Mallampati: II   Neck ROM: full    Dental   Pulmonary asthma , former smoker   breath sounds clear to auscultation       Cardiovascular negative cardio ROS  Rhythm:regular Rate:Normal     Neuro/Psych  Headaches PSYCHIATRIC DISORDERS Anxiety Depression       GI/Hepatic   Endo/Other  diabetes, Type 2    Renal/GU stones     Musculoskeletal   Abdominal   Peds  Hematology   Anesthesia Other Findings   Reproductive/Obstetrics                             Anesthesia Physical Anesthesia Plan  ASA: 2  Anesthesia Plan: General   Post-op Pain Management:    Induction: Intravenous  PONV Risk Score and Plan: 3 and Ondansetron, Dexamethasone, Midazolam and Treatment may vary due to age or medical condition  Airway Management Planned: Oral ETT  Additional Equipment:   Intra-op Plan:   Post-operative Plan: Extubation in OR  Informed Consent: I have reviewed the patients History and Physical, chart, labs and discussed the procedure including the risks, benefits and alternatives for the proposed anesthesia with the patient or authorized representative who has indicated his/her understanding and acceptance.     Dental advisory given  Plan Discussed with: CRNA, Anesthesiologist and Surgeon  Anesthesia Plan Comments:        Anesthesia Quick Evaluation

## 2023-03-05 NOTE — Discharge Instructions (Signed)
Flexible Bronchoscopy, Care After This sheet gives you information about how to care for yourself after your test. Your doctor may also give you more specific instructions. If you have problems or questions, contact your doctor. Follow these instructions at home: Eating and drinking When your numbness is gone and your cough and gag reflexes have come back, you may: Eat only soft foods. Slowly drink liquids. The day after the test, go back to your normal diet. Driving Do not drive for 24 hours if you were given a medicine to help you relax (sedative). Do not drive or use heavy machinery while taking prescription pain medicine. General instructions  Take over-the-counter and prescription medicines only as told by your doctor. Return to your normal activities as told. Ask what activities are safe for you. Do not use any products that have nicotine or tobacco in them. This includes cigarettes and e-cigarettes. If you need help quitting, ask your doctor. Keep all follow-up visits as told by your doctor. This is important. It is very important if you had a tissue sample (biopsy) taken. Get help right away if: You have shortness of breath that gets worse. You get light-headed. You feel like you are going to pass out (faint). You have chest pain. You cough up: More than a little blood. More blood than before. Summary Do not eat or drink anything (not even water) for 2 hours after your test, or until your numbing medicine wears off. Do not use cigarettes. Do not use e-cigarettes. Get help right away if you have chest pain.  Please call our office for any questions or concerns.  [phone].  This information is not intended to replace advice given to you by your health care provider. Make sure you discuss any questions you have with your health care provider. Document Released: 09/10/2009 Document Revised: 10/26/2017 Document Reviewed: 12/01/2016 Elsevier Patient Education  2020 Elsevier  Inc.  

## 2023-03-05 NOTE — Anesthesia Procedure Notes (Signed)
Procedure Name: Intubation Date/Time: 03/05/2023 9:24 AM  Performed by: Nils Pyle, CRNAPre-anesthesia Checklist: Patient identified, Emergency Drugs available, Suction available and Patient being monitored Patient Re-evaluated:Patient Re-evaluated prior to induction Oxygen Delivery Method: Circle System Utilized Preoxygenation: Pre-oxygenation with 100% oxygen Induction Type: IV induction Ventilation: Mask ventilation without difficulty Laryngoscope Size: Miller and 2 Grade View: Grade I Tube type: Oral Tube size: 8.5 mm Number of attempts: 1 Airway Equipment and Method: Stylet and Oral airway Placement Confirmation: ETT inserted through vocal cords under direct vision, positive ETCO2 and breath sounds checked- equal and bilateral Secured at: 22 cm Tube secured with: Tape Dental Injury: Teeth and Oropharynx as per pre-operative assessment

## 2023-03-05 NOTE — Interval H&P Note (Signed)
History and Physical Interval Note:  03/05/2023 7:13 AM  Connie Jenkins  has presented today for surgery, with the diagnosis of MULTIPLE PULMONARY NODULES.  The various methods of treatment have been discussed with the patient and family. After consideration of risks, benefits and other options for treatment, the patient has consented to  Procedure(s): ROBOTIC ASSISTED NAVIGATIONAL BRONCHOSCOPY (Bilateral) as a surgical intervention.  The patient's history has been reviewed, patient examined, no change in status, stable for surgery.  I have reviewed the patient's chart and labs.  Questions were answered to the patient's satisfaction.     Leslye Peer

## 2023-03-06 ENCOUNTER — Telehealth: Payer: Self-pay

## 2023-03-06 LAB — CULTURE, BAL-QUANTITATIVE W GRAM STAIN

## 2023-03-06 LAB — CULTURE, RESPIRATORY W GRAM STAIN: Gram Stain: NONE SEEN

## 2023-03-06 NOTE — Telephone Encounter (Signed)
Patient calls nurse line requesting results from Thyroid biopsy.   Will forward to PCP.

## 2023-03-07 ENCOUNTER — Encounter: Payer: Self-pay | Admitting: Emergency Medicine

## 2023-03-07 DIAGNOSIS — F331 Major depressive disorder, recurrent, moderate: Secondary | ICD-10-CM | POA: Diagnosis not present

## 2023-03-07 LAB — ACID FAST SMEAR (AFB, MYCOBACTERIA)
Acid Fast Smear: NEGATIVE
Acid Fast Smear: NEGATIVE

## 2023-03-07 LAB — ANAEROBIC CULTURE W GRAM STAIN

## 2023-03-07 LAB — CULTURE, BAL-QUANTITATIVE W GRAM STAIN
Culture: 6000 — AB
Culture: NO GROWTH

## 2023-03-07 LAB — CULTURE, RESPIRATORY W GRAM STAIN: Culture: NO GROWTH

## 2023-03-07 NOTE — Telephone Encounter (Signed)
Dr. Delton Coombes please advise on the following My Chart message:   Connie [facility] (supporting Leslye Peer, MD)36 minutes ago (3:34 PM)    Hi, my left eye has a bloody presence on half of my eye, is this normal after the lung biopsy?   Thank you

## 2023-03-07 NOTE — Telephone Encounter (Signed)
This isn't a frequent occurrence, but sometimes you can have a scleral hemorrhage with strong coughing. I suspect that is what she is experiencing. If so It is harmless and should go away on its own. We do need to make sure that she is not having pain or any visual changes - if so then I would want her to be checked out

## 2023-03-08 ENCOUNTER — Other Ambulatory Visit: Payer: Self-pay | Admitting: Student

## 2023-03-08 LAB — CYTOLOGY - NON PAP

## 2023-03-08 LAB — ANAEROBIC CULTURE W GRAM STAIN

## 2023-03-08 NOTE — Telephone Encounter (Signed)
Her CXR performed after her bronchoscopy has some hazziness that is expected after a lavage is performed. I bel;ieve her pneumonia has been treated.   If her eye is painful, then that would be very atypical after a bronchoscopy. I believe she should go have it checked out by urgent care

## 2023-03-08 NOTE — Telephone Encounter (Signed)
Dr. Delton Coombes, please advise on pt's most recent message asking if she still has pneumonia based of recent cxr. Thanks.

## 2023-03-08 NOTE — Telephone Encounter (Signed)
Thank you my eye does hurt and I have a headache on my left side  Also, pt asking if her cxr shows a pna   Please advise, thanks   Note that we are out of openings in the office rest of this wk unfortunately.

## 2023-03-09 ENCOUNTER — Telehealth: Payer: Self-pay | Admitting: Emergency Medicine

## 2023-03-09 ENCOUNTER — Encounter (HOSPITAL_COMMUNITY): Payer: Self-pay | Admitting: Emergency Medicine

## 2023-03-09 LAB — CYTOLOGY - NON PAP

## 2023-03-09 NOTE — Telephone Encounter (Signed)
Reviewed the bronchoscopy results with the patient by phone.  All of her transbronchial brushings and biopsies from multiple pulmonary nodules were negative for malignancy.  We will plan for serial CT imaging to follow.  Her bacterial culture shows rare Prevotella, unclear significance.  Her AFB and fungal cultures are pending.  She has follow-up with me in 2 weeks.

## 2023-03-13 LAB — FUNGUS CULTURE WITH STAIN

## 2023-03-13 LAB — FUNGUS CULTURE RESULT

## 2023-03-14 DIAGNOSIS — F331 Major depressive disorder, recurrent, moderate: Secondary | ICD-10-CM | POA: Diagnosis not present

## 2023-03-21 DIAGNOSIS — F331 Major depressive disorder, recurrent, moderate: Secondary | ICD-10-CM | POA: Diagnosis not present

## 2023-03-22 ENCOUNTER — Ambulatory Visit: Payer: Medicaid Other | Admitting: Emergency Medicine

## 2023-03-22 ENCOUNTER — Encounter: Payer: Self-pay | Admitting: Emergency Medicine

## 2023-03-22 VITALS — BP 130/72 | HR 70 | Temp 98.6°F | Ht 62.0 in | Wt 228.0 lb

## 2023-03-22 DIAGNOSIS — R918 Other nonspecific abnormal finding of lung field: Secondary | ICD-10-CM

## 2023-03-22 DIAGNOSIS — J309 Allergic rhinitis, unspecified: Secondary | ICD-10-CM | POA: Insufficient documentation

## 2023-03-22 DIAGNOSIS — J301 Allergic rhinitis due to pollen: Secondary | ICD-10-CM

## 2023-03-22 NOTE — Assessment & Plan Note (Addendum)
Etiology remains unclear.  She had negative cytology, negative cultures so far.  Denies any GI symptoms.  She does have irregular periods, is planning to follow-up with OB/gynecology, but no focal findings on the CT of her abdomen and pelvis in March.  Her thyroid evaluation was reassuring.  Question whether this could be autoimmune/inflammatory.  Check an ACE level today.  We will plan to repeat her CT chest at short interval, 3 months.  If enlarging or any evidence of progressing then would favor repeat bronchoscopy versus alternative biopsy.

## 2023-03-22 NOTE — Assessment & Plan Note (Signed)
Continue cetirizine 

## 2023-03-22 NOTE — Patient Instructions (Signed)
We will plan to repeat your CT scan of the chest in June 2024. Continue your cetirizine once daily Follow-up with OB/Gyn as planned Follow-up with your PCP regarding your thyroid testing as planned Follow Dr. Delton Coombes in June to review your CT chest

## 2023-03-22 NOTE — Progress Notes (Signed)
Subjective:    Patient ID: Connie Jenkins, female    DOB: 09/14/1981, 42 y.o.   MRN: [id number]  HPI  42 year old woman with a minimal tobacco history, asthma with seasonal allergies, anxiety/depression, IBS.  She is referred today for evaluation of an abnormal CT scan of the chest.  She was in the ED 02/13/2023 for right upper quadrant pain.  As part of this evaluation she had CT scans of the chest abdomen pelvis.  No intra-abdominal abnormality was noted but there were bilateral scattered pulmonary nodules concerning for either malignancy or inflammatory disease.  There was also a large thyroid nodule in the left lobe of the thyroid.  A thyroid ultrasound has been planned for 02/22/2023 She has been having cough since November, some progressive dyspnea.  She is having L pleuritic pain  CT chest 02/13/2023 reviewed by me shows enlargement of the left lobe of the thyroid gland 2.4 x 3.6 x 5.3 cm that extends into the upper thoracic inlet.  There is no mediastinal or hilar adenopathy.  There are scattered bilateral pulmonary nodules that are new compared with 12/2021.  Includes 11 mm right upper lobe nodule, 16 mm lingular nodule, 11 mm left lower lobe nodule, 11 mm medial right lower lobe nodule.  No other abnormalities noted.  CT abdomen and pelvis showed no acute findings in the abdomen, new small bilateral pulmonary nodules of unclear etiology  ROV 03/22/23 --42 year old woman whom I saw March 2024 for newly identified pulmonary nodular disease noted on CT scan of the chest abdomen and pelvis.  She also had enlargement of left lobe thyroid that was biopsied 02/28/2023 that showed a benign follicular nodule.  She underwent bronchoscopy with biopsies 03/05/2023.  Multiple full targets biopsy, 2 in the left upper lobe, 2 in the right upper lobe.  All cytologies were negative.  Bacterial and fungal culture negative, AFB smear negative and culture final pending.  Today she reports that she is feeling better.  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 Occasional cough, no longer sputum. She is having allergy sx these days. She is on cetrizine.     Review of Systems As per HPI  Past Medical History:  Diagnosis Date   Anxiety    no meds   Asthma    no problems, no inhaler   Carpal tunnel syndrome, bilateral 06/07/2022   in [place]   Depression    no current Tx.   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   History of kidney stones    surg x 1 and passed stones x 1   IBS (irritable bowel syndrome)    Pneumonia    x 1   Pulmonary nodules 01/2023   bilateral scattered pulmonary nodules   Renal stone 08/26/2014   surgery to remove   Seasonal allergies    Thyroid nodule 01/2023   large, on left lobe     Family History  Problem Relation Age of Onset   Hypertension Mother    Diabetes Mother      Social History   Socioeconomic History   Marital status: Single    Spouse name: Not on file   Number of children: Not on file   Years of education: Not on file   Highest education level: Not on file  Occupational History   Not on file  Tobacco Use   Smoking status:  Former    Types: Cigars    Passive exposure: Never   Smokeless tobacco: Never   Tobacco comments:    Pt smoked Cigars for 8 months. ARJ 02/20/23  Vaping Use   Vaping Use: Never used  Substance and Sexual Activity   Alcohol use: Not Currently    Alcohol/week: 1.0 standard drink of alcohol    Types: 1 Standard drinks or equivalent per week    Comment: occasionally   Drug use: No   Sexual activity: Not Currently    Partners: Male    Birth control/protection: None  Other Topics Concern   Not on file  Social History Narrative   Not on file   Social Determinants of Health   Financial Resource Strain: Not on file  Food Insecurity: Not on file  Transportation Needs: Not on file  Physical Activity: Not on file  Stress: Not on file  Social  Connections: Not on file  Intimate Partner Violence: Not on file    She is in school, also works in deliveries She is worked in [employer] before, no other exposures except for dust Negative TB test 1 year ago No [employer] [place] native  Allergies  Allergen Reactions   Flagyl [Metronidazole] Nausea And Vomiting   Latex Itching and Swelling   Penicillins Hives, Other (See Comments) and Rash    Childhood allergy Has patient had a PCN reaction causing immediate rash, facial/tongue/throat swelling, SOB or lightheadedness with hypotension: unknown Has patient had a PCN reaction causing severe rash involving mucus membranes or skin necrosis: unknown Has patient had a PCN reaction that required hospitalization unknown Has patient had a PCN reaction occurring within the last 10 years: no If all of the above answers are "NO", then may proceed with Cephalosporin use.   Childhood allergy Has patient had a PCN reaction causing immediate rash, facial/tongue/throat swelling, SOB or lightheadedness with hypotension: unknown Has patient had a PCN reaction causing severe rash involving mucus membranes or skin necrosis: unknown Has patient had a PCN reaction that required hospitalization unknown Has patient had a PCN reaction occurring within the last 10 years: no If all of the above answers are "NO", then may proceed with Cephalosporin use.     No outpatient medications prior to visit.   No facility-administered medications prior to visit.        Objective:   Physical Exam Vitals:   03/22/23 0903  BP: 130/72  Pulse: 70  Temp: 98.6 F (37 C)  TempSrc: Oral  SpO2: 98%  Weight: 228 lb (103.4 kg)  Height:  (1.575 m)   Gen: Pleasant, well-nourished, in no distress,  normal affect  ENT: No lesions,  mouth clear,  oropharynx clear, no postnasal drip  Neck: No JVD, no stridor  Lungs: No use of accessory muscles, no crackles or wheezing on normal respiration, no wheeze on  forced expiration  Cardiovascular: RRR, heart sounds normal, no murmur or gallops, no peripheral edema  Musculoskeletal: No deformities, no cyanosis or clubbing  Neuro: alert, awake, non focal  Skin: Warm, no lesions or rash      Assessment & Plan:   Pulmonary nodules Etiology remains unclear.  She had negative cytology, negative cultures so far.  Denies any GI symptoms.  She does have irregular periods, is planning to follow-up with OB/gynecology, but no focal findings on the CT of her abdomen and pelvis in March.  Her thyroid evaluation was reassuring.  Question whether this could be autoimmune/inflammatory.  Check an ACE  level today.  We will plan to repeat her CT chest at short interval, 3 months.  If enlarging or any evidence of progressing then would favor repeat bronchoscopy versus alternative biopsy.  Allergic rhinitis Continue cetirizine   Levy Pupa, MD, PhD 03/22/2023, 1:12 PM [facility] [phone] or if no answer before 7:00PM call [phone] For any issues after 7:00PM please call eLink [phone]

## 2023-03-23 LAB — ANGIOTENSIN CONVERTING ENZYME: Angiotensin-Converting Enzyme: 69 U/L — ABNORMAL HIGH (ref 9–67)

## 2023-03-28 ENCOUNTER — Other Ambulatory Visit: Payer: Self-pay | Admitting: Internal Medicine

## 2023-03-28 DIAGNOSIS — J984 Other disorders of lung: Secondary | ICD-10-CM | POA: Diagnosis not present

## 2023-03-28 DIAGNOSIS — N760 Acute vaginitis: Secondary | ICD-10-CM | POA: Diagnosis not present

## 2023-03-28 DIAGNOSIS — E041 Nontoxic single thyroid nodule: Secondary | ICD-10-CM | POA: Diagnosis not present

## 2023-03-28 DIAGNOSIS — K581 Irritable bowel syndrome with constipation: Secondary | ICD-10-CM | POA: Diagnosis not present

## 2023-03-28 DIAGNOSIS — Z13220 Encounter for screening for lipoid disorders: Secondary | ICD-10-CM | POA: Diagnosis not present

## 2023-03-28 DIAGNOSIS — E669 Obesity, unspecified: Secondary | ICD-10-CM | POA: Diagnosis not present

## 2023-03-28 DIAGNOSIS — Z131 Encounter for screening for diabetes mellitus: Secondary | ICD-10-CM | POA: Diagnosis not present

## 2023-03-28 DIAGNOSIS — Z1239 Encounter for other screening for malignant neoplasm of breast: Secondary | ICD-10-CM | POA: Diagnosis not present

## 2023-03-28 DIAGNOSIS — E559 Vitamin D deficiency, unspecified: Secondary | ICD-10-CM | POA: Diagnosis not present

## 2023-03-28 DIAGNOSIS — Z124 Encounter for screening for malignant neoplasm of cervix: Secondary | ICD-10-CM | POA: Diagnosis not present

## 2023-03-28 DIAGNOSIS — Z1231 Encounter for screening mammogram for malignant neoplasm of breast: Secondary | ICD-10-CM

## 2023-03-28 DIAGNOSIS — Z0000 Encounter for general adult medical examination without abnormal findings: Secondary | ICD-10-CM | POA: Diagnosis not present

## 2023-03-28 DIAGNOSIS — J302 Other seasonal allergic rhinitis: Secondary | ICD-10-CM | POA: Diagnosis not present

## 2023-04-02 ENCOUNTER — Ambulatory Visit: Payer: Medicaid Other

## 2023-04-03 DIAGNOSIS — F331 Major depressive disorder, recurrent, moderate: Secondary | ICD-10-CM | POA: Diagnosis not present

## 2023-04-04 ENCOUNTER — Encounter: Payer: Self-pay | Admitting: Podiatry

## 2023-04-04 ENCOUNTER — Ambulatory Visit (INDEPENDENT_AMBULATORY_CARE_PROVIDER_SITE_OTHER): Payer: Medicaid Other

## 2023-04-04 ENCOUNTER — Ambulatory Visit: Payer: Medicaid Other | Admitting: Podiatry

## 2023-04-04 DIAGNOSIS — M722 Plantar fascial fibromatosis: Secondary | ICD-10-CM | POA: Diagnosis not present

## 2023-04-04 MED ORDER — TRIAMCINOLONE ACETONIDE 10 MG/ML IJ SUSP
10.0000 mg | Freq: Once | INTRAMUSCULAR | Status: AC
Start: 2023-04-04 — End: 2023-04-04
  Administered 2023-04-04: 10 mg

## 2023-04-04 NOTE — Progress Notes (Signed)
Subjective:   Patient ID: Connie Jenkins, female   DOB: 42 y.o.   MRN: [id number]   HPI Patient states she was doing well and the pain in the heel has come back over the last 3 months   ROS      Objective:  Physical Exam  Neurovascular status intact exquisite discomfort medial fascial band right at insertion     Assessment:  Acute Planter fasciitis right that did well for about 7 months     Plan:  Sterile prep went ahead reviewed x-ray then injected the plantar fascia 3 mg Kenalog 5 mg Xylocaine advised on shoe gear support reappoint  X-rays negative for signs of fracture does indicate spur formation present

## 2023-04-06 LAB — FUNGUS CULTURE WITH STAIN

## 2023-04-06 LAB — FUNGAL ORGANISM REFLEX

## 2023-04-06 LAB — FUNGUS CULTURE RESULT

## 2023-04-09 ENCOUNTER — Other Ambulatory Visit: Payer: Self-pay | Admitting: Podiatry

## 2023-04-09 ENCOUNTER — Telehealth: Payer: Self-pay

## 2023-04-09 MED ORDER — MELOXICAM 7.5 MG PO TABS: 7.5000 mg | ORAL_TABLET | Freq: Every day | ORAL | 2 refills | Status: DC

## 2023-04-09 NOTE — Telephone Encounter (Signed)
Patient was calling to follow up on a prescription that the provider stated he would call in for inflammation.

## 2023-04-09 NOTE — Telephone Encounter (Signed)
done

## 2023-04-13 DIAGNOSIS — F331 Major depressive disorder, recurrent, moderate: Secondary | ICD-10-CM | POA: Diagnosis not present

## 2023-04-18 DIAGNOSIS — F4320 Adjustment disorder, unspecified: Secondary | ICD-10-CM | POA: Diagnosis not present

## 2023-04-26 DIAGNOSIS — F4320 Adjustment disorder, unspecified: Secondary | ICD-10-CM | POA: Diagnosis not present

## 2023-04-26 DIAGNOSIS — N898 Other specified noninflammatory disorders of vagina: Secondary | ICD-10-CM | POA: Diagnosis not present

## 2023-04-26 DIAGNOSIS — R319 Hematuria, unspecified: Secondary | ICD-10-CM | POA: Diagnosis not present

## 2023-04-26 LAB — ACID FAST CULTURE WITH REFLEXED SENSITIVITIES (MYCOBACTERIA)
Acid Fast Culture: NEGATIVE
Acid Fast Culture: NEGATIVE

## 2023-05-02 DIAGNOSIS — F4320 Adjustment disorder, unspecified: Secondary | ICD-10-CM | POA: Diagnosis not present

## 2023-05-03 ENCOUNTER — Other Ambulatory Visit: Payer: Self-pay | Admitting: Podiatry

## 2023-05-03 ENCOUNTER — Encounter: Payer: Self-pay | Admitting: Podiatry

## 2023-05-03 ENCOUNTER — Ambulatory Visit
Admission: RE | Admit: 2023-05-03 | Discharge: 2023-05-03 | Disposition: A | Discharge status: Home / Self Care | Payer: Medicaid Other | Source: Ambulatory Visit | Attending: Emergency Medicine | Admitting: Emergency Medicine

## 2023-05-03 DIAGNOSIS — R918 Other nonspecific abnormal finding of lung field: Secondary | ICD-10-CM | POA: Diagnosis not present

## 2023-05-04 DIAGNOSIS — Z309 Encounter for contraceptive management, unspecified: Secondary | ICD-10-CM | POA: Diagnosis not present

## 2023-05-04 DIAGNOSIS — J302 Other seasonal allergic rhinitis: Secondary | ICD-10-CM | POA: Diagnosis not present

## 2023-05-11 ENCOUNTER — Ambulatory Visit: Payer: Medicaid Other | Admitting: Emergency Medicine

## 2023-05-11 ENCOUNTER — Encounter: Payer: Self-pay | Admitting: Emergency Medicine

## 2023-05-11 VITALS — BP 130/82 | HR 74 | Temp 98.8°F | Ht 62.0 in | Wt 226.4 lb

## 2023-05-11 DIAGNOSIS — R918 Other nonspecific abnormal finding of lung field: Secondary | ICD-10-CM

## 2023-05-11 NOTE — Addendum Note (Signed)
Addended by: Dorisann Frames R on: 05/11/2023 09:40 AM   Modules accepted: Orders

## 2023-05-11 NOTE — Assessment & Plan Note (Signed)
Largely resolved on her repeat CT chest.  Cultures are negative, ACE level slightly elevated.  She also has a history of asthma in the past (not active).  Entire clinical picture is suggestive of sarcoidosis.  Transbronchial biopsies did not show any granulomas.  Discussed this with her today  We reviewed your CT scan of the chest today.  Your pulmonary nodules have largely resolved.  Good news. Overall evaluation is suggestive of a noninfectious inflammatory process, possibly sarcoidosis. We will plan to repeat your CT chest in June 2025 We will arrange for pulmonary function testing in June 2025 We will repeat your blood work (ACE level) at that time as well Please call if you have any changes in your breathing, respiratory status, any new rash or swollen lymph nodes.  If so we would like to see you sooner. Follow Dr. Delton Coombes in 1 year

## 2023-05-11 NOTE — Progress Notes (Signed)
Subjective:    Patient ID: Connie Jenkins, female    DOB: 28-Oct-1981, 42 y.o.   MRN: [id number]  HPI  ROV 03/22/23 --42 year old woman whom I saw March 2024 for newly identified pulmonary nodular disease noted on CT scan of the chest abdomen and pelvis.  She also had enlargement of left lobe thyroid that was biopsied 02/28/2023 that showed a benign follicular nodule.  She underwent bronchoscopy with biopsies 03/05/2023.  Multiple full targets biopsy, 2 in the left upper lobe, 2 in the right upper lobe.  All cytologies were negative.  Bacterial and fungal culture negative, AFB smear negative and culture final pending.  Today she reports that she is feeling better. 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 Occasional cough, no longer sputum. She is having allergy sx these days. She is on cetrizine.    ROV 05/11/2023 --Connie Jenkins is 42 and follows up today for scattered pulmonary nodules found on lung imaging.  She underwent navigational bronchoscopy/8/24 with multiple pulmonary nodules biopsied (2 left upper lobe, 2 right upper lobe) with negative cytologies.  AFB and fungal smears and cultures negative. PMH: Asthma, former tobacco use, seasonal allergies, anxiety/depression, IBS, thyroid nodule  ACE level 03/22/2023 was 69 (slightly elevated)  Super D CT chest 05/03/2023 reviewed by me, shows no mediastinal or hilar adenopathy.  Resolution of numerous pulmonary nodules seen on her prior CT chest with some minimal residual patchy areas of groundglass.   Review of Systems As per HPI  Past Medical History:  Diagnosis Date   Anxiety    no meds   Asthma    no problems, no inhaler   Carpal tunnel syndrome, bilateral 06/07/2022   in [place]   Depression    no current Tx.   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   History of kidney stones    surg x 1 and passed stones x 1   IBS (irritable bowel syndrome)    Pneumonia    x 1    Pulmonary nodules 01/2023   bilateral scattered pulmonary nodules   Renal stone 08/26/2014   surgery to remove   Seasonal allergies    Thyroid nodule 01/2023   large, on left lobe     Family History  Problem Relation Age of Onset   Hypertension Mother    Diabetes Mother      Social History   Socioeconomic History   Marital status: Single    Spouse name: Not on file   Number of children: Not on file   Years of education: Not on file   Highest education level: Not on file  Occupational History   Not on file  Tobacco Use   Smoking status: Former    Types: Cigars    Passive exposure: Never   Smokeless tobacco: Never   Tobacco comments:    Pt smoked Cigars for 8 months. ARJ 02/20/23  Vaping Use   Vaping Use: Never used  Substance and Sexual Activity   Alcohol use: Not Currently    Alcohol/week: 1.0 standard drink of alcohol    Types: 1 Standard drinks or equivalent per week    Comment: occasionally   Drug use: No   Sexual activity: Not Currently    Partners: Male    Birth control/protection: None  Other Topics Concern   Not on file  Social History Narrative   Not on  file   Social Determinants of Health   Financial Resource Strain: Not on file  Food Insecurity: Not on file  Transportation Needs: Not on file  Physical Activity: Not on file  Stress: Not on file  Social Connections: Not on file  Intimate Partner Violence: Not on file    She is in school, also works in deliveries She is worked in [employer] before, no other exposures except for dust Negative TB test 1 year ago No [employer] [place] native  Allergies  Allergen Reactions   Flagyl [Metronidazole] Nausea And Vomiting   Latex Itching and Swelling   Penicillins Hives, Other (See Comments) and Rash    Childhood allergy Has patient had a PCN reaction causing immediate rash, facial/tongue/throat swelling, SOB or lightheadedness with hypotension: unknown Has patient had a PCN reaction  causing severe rash involving mucus membranes or skin necrosis: unknown Has patient had a PCN reaction that required hospitalization unknown Has patient had a PCN reaction occurring within the last 10 years: no If all of the above answers are "NO", then may proceed with Cephalosporin use.   Childhood allergy Has patient had a PCN reaction causing immediate rash, facial/tongue/throat swelling, SOB or lightheadedness with hypotension: unknown Has patient had a PCN reaction causing severe rash involving mucus membranes or skin necrosis: unknown Has patient had a PCN reaction that required hospitalization unknown Has patient had a PCN reaction occurring within the last 10 years: no If all of the above answers are "NO", then may proceed with Cephalosporin use.     Outpatient Medications Prior to Visit  Medication Sig Dispense Refill   meloxicam (MOBIC) 7.5 MG tablet Take 1 tablet (7.5 mg total) by mouth daily. 30 tablet 2   No facility-administered medications prior to visit.        Objective:   Physical Exam Vitals:   05/11/23 0900  BP: 130/82  Pulse: 74  Temp: 98.8 F (37.1 C)  TempSrc: Oral  SpO2: 100%  Weight: 226 lb 6.4 oz (102.7 kg)  Height: 5\' 2"  (1.575 m)   Gen: Pleasant, well-nourished, in no distress,  normal affect  ENT: No lesions,  mouth clear,  oropharynx clear, no postnasal drip  Neck: No JVD, no stridor  Lungs: No use of accessory muscles, no crackles or wheezing on normal respiration, no wheeze on forced expiration  Cardiovascular: RRR, heart sounds normal, no murmur or gallops, no peripheral edema  Musculoskeletal: No deformities, no cyanosis or clubbing  Neuro: alert, awake, non focal  Skin: Warm, no lesions or rash      Assessment & Plan:   Pulmonary nodules Largely resolved on her repeat CT chest.  Cultures are negative, ACE level slightly elevated.  She also has a history of asthma in the past (not active).  Entire clinical picture is  suggestive of sarcoidosis.  Transbronchial biopsies did not show any granulomas.  Discussed this with her today  We reviewed your CT scan of the chest today.  Your pulmonary nodules have largely resolved.  Good news. Overall evaluation is suggestive of a noninfectious inflammatory process, possibly sarcoidosis. We will plan to repeat your CT chest in June 2025 We will arrange for pulmonary function testing in June 2025 We will repeat your blood work (ACE level) at that time as well Please call if you have any changes in your breathing, respiratory status, any new rash or swollen lymph nodes.  If so we would like to see you sooner. Follow Dr. Delton Coombes in 1 year  Levy Pupa, MD, PhD 05/11/2023, 9:32 AM [facility] [phone] or if no answer before 7:00PM call [phone] For any issues after 7:00PM please call eLink [phone]

## 2023-05-11 NOTE — Patient Instructions (Signed)
We reviewed your CT scan of the chest today.  Your pulmonary nodules have largely resolved.  Good news. Overall evaluation is suggestive of a noninfectious inflammatory process, possibly sarcoidosis. We will plan to repeat your CT chest in June 2025 We will arrange for pulmonary function testing in June 2025 We will repeat your blood work (ACE level) at that time as well Please call if you have any changes in your breathing, respiratory status, any new rash or swollen lymph nodes.  If so we would like to see you sooner. Follow Dr. Delton Coombes in 1 year

## 2023-05-16 DIAGNOSIS — F4320 Adjustment disorder, unspecified: Secondary | ICD-10-CM | POA: Diagnosis not present

## 2023-05-18 DIAGNOSIS — E041 Nontoxic single thyroid nodule: Secondary | ICD-10-CM | POA: Diagnosis not present

## 2023-05-23 DIAGNOSIS — F4321 Adjustment disorder with depressed mood: Secondary | ICD-10-CM | POA: Diagnosis not present

## 2023-05-26 NOTE — Progress Notes (Deleted)
    SUBJECTIVE:   Chief compliant/HPI: annual examination  Connie Jenkins is a 42 y.o. who presents today for an annual exam.   Reviewed and updated history.   OBJECTIVE:  There were no vitals taken for this visit.  ***  ASSESSMENT/PLAN:  There are no diagnoses linked to this encounter.  Annual Examination  See AVS for age appropriate recommendations  PHQ score ***, reviewed and discussed.  Blood pressure reviewed and at goal. ***   Advanced directive ***   Considered the following items based upon USPSTF recommendations: HIV testing: {not indicated/requested/declined:14582} Hepatitis C: {not indicated/requested/declined:14582} Hepatitis B: {not indicated/requested/declined:14582} Syphilis if at high risk: {{not indicated/requested/declined:14582} GC/CT{not indicated/requested/declined:14582} Lipid panel (nonfasting or fasting) discussed based upon AHA recommendations and {ordered not order:23822}.  Consider repeat every 4-6 years.  Reviewed risk factors for latent tuberculosis and {not indicated/requested/declined:14582} Immunizations ***   Follow up in 1 year or sooner if indicated.    Shelby Mattocks, DO [place] [facility]

## 2023-05-28 ENCOUNTER — Encounter: Payer: Medicaid Other | Admitting: Student

## 2023-05-30 DIAGNOSIS — F4320 Adjustment disorder, unspecified: Secondary | ICD-10-CM | POA: Diagnosis not present

## 2023-06-01 ENCOUNTER — Ambulatory Visit
Admission: RE | Admit: 2023-06-01 | Discharge: 2023-06-01 | Disposition: A | Discharge status: Home / Self Care | Payer: Medicaid Other | Source: Ambulatory Visit | Attending: Internal Medicine | Admitting: Internal Medicine

## 2023-06-01 DIAGNOSIS — Z1231 Encounter for screening mammogram for malignant neoplasm of breast: Secondary | ICD-10-CM

## 2023-06-05 ENCOUNTER — Encounter: Payer: Self-pay | Admitting: Student

## 2023-06-05 ENCOUNTER — Ambulatory Visit (INDEPENDENT_AMBULATORY_CARE_PROVIDER_SITE_OTHER): Payer: Medicaid Other | Admitting: Student

## 2023-06-05 ENCOUNTER — Other Ambulatory Visit (HOSPITAL_COMMUNITY): Admission: RE | Admit: 2023-06-05 | Payer: Medicaid Other | Source: Ambulatory Visit

## 2023-06-05 VITALS — BP 120/80 | HR 86 | Ht 62.0 in | Wt 226.0 lb

## 2023-06-05 DIAGNOSIS — Z30430 Encounter for insertion of intrauterine contraceptive device: Secondary | ICD-10-CM

## 2023-06-05 DIAGNOSIS — Z124 Encounter for screening for malignant neoplasm of cervix: Secondary | ICD-10-CM

## 2023-06-05 DIAGNOSIS — Z113 Encounter for screening for infections with a predominantly sexual mode of transmission: Secondary | ICD-10-CM | POA: Diagnosis not present

## 2023-06-05 DIAGNOSIS — Z3009 Encounter for other general counseling and advice on contraception: Secondary | ICD-10-CM

## 2023-06-05 DIAGNOSIS — N898 Other specified noninflammatory disorders of vagina: Secondary | ICD-10-CM | POA: Diagnosis not present

## 2023-06-05 LAB — POCT WET PREP (WET MOUNT)
Clue Cells Wet Prep Whiff POC: POSITIVE
Trichomonas Wet Prep HPF POC: ABSENT
WBC, Wet Prep HPF POC: NONE SEEN

## 2023-06-05 LAB — POCT URINE PREGNANCY: Preg Test, Ur: NEGATIVE

## 2023-06-05 MED ORDER — CLEOCIN 100 MG VA SUPP
100.0000 mg | Freq: Every day | VAGINAL | 0 refills | Status: DC
Start: 2023-06-05 — End: 2023-06-06

## 2023-06-05 MED ORDER — LEVONORGESTREL 20 MCG/DAY IU IUD
1.0000 | INTRAUTERINE_SYSTEM | Freq: Once | INTRAUTERINE | Status: AC
Start: 2023-06-05 — End: 2023-06-05
  Administered 2023-06-05: 1 via INTRAUTERINE

## 2023-06-05 NOTE — Assessment & Plan Note (Addendum)
Counseled on various contraception methods, patient opted for IUD.  Reasonable certainty that she is not pregnant given recent menses in addition to negative urine pregnancy test.  IUD Intrauterine Device Insertion Procedure Note PRE-OP DIAGNOSIS: desired long-term, reversible contraception  POST-OP DIAGNOSIS: Same  PROCEDURE: IUD placement Performing Physician: Royal Piedra Supervising Physician (if applicable): Pray  IUD type: Mirena  PROCEDURE:  Timeout procedure was performed to ensure right patient and right site.   A bimanual exam was performed to determine the position of the uterus.  The speculum was placed. The vagina and cervix was sterilized in the usual manner and sterile technique was maintained throughout the course of the procedure. A single toothed tenaculum was applied to the anterior lip of the cervix and gentle traction applied to straighten and stabilize it. The depth of the uterus was sounded to be of appropriate depth (6cm). With gentle traction on the tenaculum, the IUD was inserted to the appropriate depth and deposited by withdrawing on the insertion tube holding the rod steady. The string was cut to an estimated 4 cm length. Bleeding was minimal. The patient tolerated the  procedure well.   Follow-up: The patient tolerated the procedure well without complications.  Standard post-procedure care was explained and return precautions were given.

## 2023-06-05 NOTE — Assessment & Plan Note (Addendum)
Wet prep reveals clue cells many bacteria and positive whiff consistent with BV.  Will treat with clindamycin suppository given she has metronidazole allergy.

## 2023-06-05 NOTE — Patient Instructions (Signed)
It was great to see you today! Thank you for choosing [facility] for your primary care. Connie Jenkins was seen for birth control and Pap.  Today we addressed: When you go home please take 800 mg of ibuprofen in a tall glass of water.  You may take this every 8 hours for the next 1 to 2 days for any cramping you might be feeling.  This Mirena lasts 8 years.  Some women like to make sure that their strings are there which can confirm IUD placement.  You may check for this yourself (or have a partner check) or have your physician check at any time.  I have attached a handout for what to expect as we discussed by having the IUD in place.  We may take this out anytime you would like should that be your wish. I will be in touch with you regarding the labs and Pap that we collected today as well.  If you haven't already, sign up for My Chart to have easy access to your labs results, and communication with your primary care physician.  We are checking some labs today. If they are abnormal, I will call you. If they are normal, I will send you a MyChart message (if it is active) or a letter in the mail. If you do not hear about your labs in the next 2 weeks, please call the office.  Please arrive 15 minutes before your appointment to ensure smooth check in process.  We appreciate your efforts in making this happen.  Thank you for allowing me to participate in your care, Shelby Mattocks, DO 06/05/2023, 3:07 PM PGY-3, [facility]

## 2023-06-05 NOTE — Assessment & Plan Note (Deleted)
IUD Intrauterine Device Insertion Procedure Note PRE-OP DIAGNOSIS: desired long-term, reversible contraception  POST-OP DIAGNOSIS: Same  PROCEDURE: IUD placement Performing Physician: Royal Piedra Supervising Physician (if applicable): Pray  IUD type: Mirena  PROCEDURE:  Timeout procedure was performed to ensure right patient and right site.   A bimanual exam was performed to determine the position of the uterus.  The speculum was placed. The vagina and cervix was sterilized in the usual manner and sterile technique was maintained throughout the course of the procedure. A single toothed tenaculum was applied to the anterior lip of the cervix and gentle traction applied to straighten and stabilize it. The depth of the uterus was sounded to be of appropriate depth (6cm). With gentle traction on the tenaculum, the IUD was inserted to the appropriate depth and deposited by withdrawing on the insertion tube holding the rod steady. The string was cut to an estimated 4 cm length. Bleeding was minimal. The patient tolerated the  procedure well.   Follow-up: The patient tolerated the procedure well without complications.  Standard post-procedure care was explained and return precautions were given.

## 2023-06-05 NOTE — Progress Notes (Signed)
  SUBJECTIVE:   CHIEF COMPLAINT / HPI:   Presenting today for Pap and IUD placement.  She is interested in IUD for birth control purposes as she has a new sexual partner.  She had her period last week and is also requesting STD screening.  She is concerned that she may have BV at this time.  PERTINENT  PMH / PSH: N/A  Patient Care Team: Shelby Mattocks, DO as PCP - General (Family Medicine) OBJECTIVE:  BP 120/80 (BP Location: Left Arm, Patient Position: Sitting)   Pulse 86   Ht 5\' 2"  (1.575 m)   Wt 226 lb (102.5 kg)   LMP 05/28/2023 (Exact Date)   SpO2 96%   BMI 41.34 kg/m  Pelvic exam: VULVA: normal appearing vulva with no masses, tenderness or lesions, VAGINA: vaginal discharge - clear and white, CERVIX: normal appearing cervix without discharge or lesions.  Exam chaperoned and assisted by Dr. Miquel Dunn and Lanier Prude  ASSESSMENT/PLAN:  Screening for cervical cancer -     Cytology - PAP  Encounter for counseling regarding contraception Assessment & Plan: Counseled on various contraception methods, patient opted for IUD.  Reasonable certainty that she is not pregnant given recent menses in addition to negative urine pregnancy test.  IUD Intrauterine Device Insertion Procedure Note PRE-OP DIAGNOSIS: desired long-term, reversible contraception  POST-OP DIAGNOSIS: Same  PROCEDURE: IUD placement Performing Physician: Royal Piedra Supervising Physician (if applicable): Pray  IUD type: Mirena  PROCEDURE:  Timeout procedure was performed to ensure right patient and right site.   A bimanual exam was performed to determine the position of the uterus.  The speculum was placed. The vagina and cervix was sterilized in the usual manner and sterile technique was maintained throughout the course of the procedure. A single toothed tenaculum was applied to the anterior lip of the cervix and gentle traction applied to straighten and stabilize it. The depth of the uterus was sounded to be of  appropriate depth (6cm). With gentle traction on the tenaculum, the IUD was inserted to the appropriate depth and deposited by withdrawing on the insertion tube holding the rod steady. The string was cut to an estimated 4 cm length. Bleeding was minimal. The patient tolerated the  procedure well.   Follow-up: The patient tolerated the procedure well without complications.  Standard post-procedure care was explained and return precautions were given.  Orders: -     POCT urine pregnancy -     Levonorgestrel  Vaginal discharge Assessment & Plan: Wet prep reveals clue cells many bacteria and positive whiff consistent with BV.  Will treat with clindamycin suppository given she has metronidazole allergy.  Orders: -     POCT Wet Prep (Wet [place]) -     Cleocin; Place 1 suppository (100 mg total) vaginally at bedtime for 7 days.  Dispense: 7 suppository; Refill: 0  Routine screening for STI (sexually transmitted infection) -     RPR -     HCV Ab w Reflex to Quant PCR -     Hepatitis B surface antigen -     HIV Antibody (routine testing w rflx)  Shelby Mattocks, DO 06/05/2023, 5:16 PM PGY-3, [facility]

## 2023-06-06 ENCOUNTER — Ambulatory Visit: Payer: Medicaid Other | Admitting: Emergency Medicine

## 2023-06-06 ENCOUNTER — Other Ambulatory Visit: Payer: Self-pay | Admitting: Student

## 2023-06-06 ENCOUNTER — Encounter: Payer: Self-pay | Admitting: Student

## 2023-06-06 DIAGNOSIS — N898 Other specified noninflammatory disorders of vagina: Secondary | ICD-10-CM

## 2023-06-06 LAB — RPR: RPR Ser Ql: NONREACTIVE

## 2023-06-06 LAB — CYTOLOGY - PAP
Chlamydia: NEGATIVE
Comment: NEGATIVE
Comment: NEGATIVE
Comment: NORMAL
Diagnosis: NEGATIVE
High risk HPV: NEGATIVE
Neisseria Gonorrhea: NEGATIVE

## 2023-06-06 LAB — HCV INTERPRETATION

## 2023-06-06 LAB — HCV AB W REFLEX TO QUANT PCR: HCV Ab: NONREACTIVE

## 2023-06-06 LAB — HIV ANTIBODY (ROUTINE TESTING W REFLEX): HIV Screen 4th Generation wRfx: NONREACTIVE

## 2023-06-06 LAB — HEPATITIS B SURFACE ANTIGEN: Hepatitis B Surface Ag: NEGATIVE

## 2023-06-06 MED ORDER — METRONIDAZOLE 500 MG PO TABS
500.0000 mg | ORAL_TABLET | Freq: Two times a day (BID) | ORAL | 0 refills | Status: AC
Start: 2023-06-06 — End: 2023-06-13

## 2023-06-06 NOTE — Addendum Note (Signed)
Addended by: Tanya Nones on: 06/06/2023 01:58 PM   Modules accepted: Level of Service

## 2023-06-13 ENCOUNTER — Encounter: Payer: Self-pay | Admitting: Student

## 2023-06-14 DIAGNOSIS — F4320 Adjustment disorder, unspecified: Secondary | ICD-10-CM | POA: Diagnosis not present

## 2023-06-18 ENCOUNTER — Emergency Department (HOSPITAL_BASED_OUTPATIENT_CLINIC_OR_DEPARTMENT_OTHER): Payer: Medicaid Other

## 2023-06-18 ENCOUNTER — Encounter (HOSPITAL_BASED_OUTPATIENT_CLINIC_OR_DEPARTMENT_OTHER): Payer: Self-pay

## 2023-06-18 ENCOUNTER — Emergency Department (HOSPITAL_BASED_OUTPATIENT_CLINIC_OR_DEPARTMENT_OTHER)
Admission: EM | Admit: 2023-06-18 | Discharge: 2023-06-18 | Disposition: A | Discharge status: Home / Self Care | Payer: Medicaid Other | Source: Home / Self Care | Attending: Emergency Medicine | Admitting: Emergency Medicine

## 2023-06-18 ENCOUNTER — Other Ambulatory Visit: Payer: Self-pay

## 2023-06-18 ENCOUNTER — Ambulatory Visit: Payer: Medicaid Other | Admitting: Student

## 2023-06-18 VITALS — BP 115/85 | HR 82 | Ht 62.0 in | Wt 228.0 lb

## 2023-06-18 DIAGNOSIS — T8384XA Pain from genitourinary prosthetic devices, implants and grafts, initial encounter: Secondary | ICD-10-CM | POA: Diagnosis not present

## 2023-06-18 DIAGNOSIS — B379 Candidiasis, unspecified: Secondary | ICD-10-CM | POA: Diagnosis not present

## 2023-06-18 DIAGNOSIS — Z30432 Encounter for removal of intrauterine contraceptive device: Secondary | ICD-10-CM

## 2023-06-18 DIAGNOSIS — T8332XA Displacement of intrauterine contraceptive device, initial encounter: Secondary | ICD-10-CM | POA: Diagnosis not present

## 2023-06-18 DIAGNOSIS — Z91040 Latex allergy status: Secondary | ICD-10-CM | POA: Insufficient documentation

## 2023-06-18 DIAGNOSIS — R102 Pelvic and perineal pain: Secondary | ICD-10-CM

## 2023-06-18 DIAGNOSIS — T839XXD Unspecified complication of genitourinary prosthetic device, implant and graft, subsequent encounter: Secondary | ICD-10-CM

## 2023-06-18 DIAGNOSIS — R1030 Lower abdominal pain, unspecified: Secondary | ICD-10-CM | POA: Diagnosis present

## 2023-06-18 LAB — URINALYSIS, ROUTINE W REFLEX MICROSCOPIC
Bilirubin Urine: NEGATIVE
Glucose, UA: NEGATIVE mg/dL
Ketones, ur: NEGATIVE mg/dL
Nitrite: NEGATIVE
Protein, ur: NEGATIVE mg/dL
Specific Gravity, Urine: 1.015 (ref 1.005–1.030)
pH: 5.5 (ref 5.0–8.0)

## 2023-06-18 LAB — URINALYSIS, MICROSCOPIC (REFLEX)

## 2023-06-18 LAB — HCG, SERUM, QUALITATIVE: Preg, Serum: NEGATIVE

## 2023-06-18 MED ORDER — FLUCONAZOLE 150 MG PO TABS
150.0000 mg | ORAL_TABLET | Freq: Once | ORAL | 0 refills | Status: AC
Start: 2023-06-18 — End: 2023-06-18

## 2023-06-18 MED ORDER — FLUCONAZOLE 150 MG PO TABS
150.0000 mg | ORAL_TABLET | Freq: Once | ORAL | 0 refills | Status: DC
Start: 2023-06-18 — End: 2023-06-18

## 2023-06-18 MED ORDER — NAPROXEN 500 MG PO TABS: 500.0000 mg | ORAL_TABLET | Freq: Two times a day (BID) | ORAL | 0 refills | Status: DC

## 2023-06-18 MED ORDER — HYDROCODONE-ACETAMINOPHEN 5-325 MG PO TABS
1.0000 | ORAL_TABLET | Freq: Once | ORAL | Status: AC
  Administered 2023-06-18: 1 via ORAL
  Filled 2023-06-18: qty 1

## 2023-06-18 NOTE — Progress Notes (Signed)
    SUBJECTIVE:   CHIEF COMPLAINT / HPI:   Patient is a 42 year old female presenting today for IUD removal.  She recently had an IUD placed (Mirena) on 06/05/2023.  Patient's request to remove her IUD due to discomfort and cramping.  Describes the pain as a right-sided lower quadrant chramping pain.  Pain has been present since her IUD placement and she endorses occasional spotting.  PERTINENT  PMH / PSH: Reviewed   OBJECTIVE:   BP 115/85   Pulse 82   Ht 5\' 2"  (1.575 m)   Wt 228 lb (103.4 kg)   LMP 05/28/2023 (Exact Date)   SpO2 100%   BMI 41.70 kg/m    Physical Exam General: Alert, well appearing, NAD Cardiovascular: RRR, No Murmurs, Normal S2/S2 Respiratory: CTAB, No wheezing or Rales Abdomen: No distension or tenderness Genitalia:  Normal  but long introitus , no external lesions, No vaginal discharge but had dark brown blood consistent with spotting.  mucosa pink and moist, no vaginal or cervical lesions, no vaginal atrophy  Chaperon: Dayshia CMA   ASSESSMENT/PLAN:    IUD Removal Procedure Note  Pre-operative Diagnosis: IUD removal  Post-operative Diagnosis: same  Indications: contraception  Procedure Details   The risks (including infection, bleeding, pain, and uterine perforation) and benefits of the procedure were explained to the patient and Written informed consent was obtained.    Patient had long introitus  and extra vaginal wall tissues which made visualization of the cervix a bit difficult and once cervix was visualize was unable to locate the IUD strands. Patient tolerated procedure well. Informed patient she will be sent to a OB/GYN for removal of the IUD. -Placed ambulatory referral to OB/gyn -Recommend Tylenol/Ibuprofen for pain    Jerre Simon, MD [facility]

## 2023-06-18 NOTE — ED Triage Notes (Signed)
Patient here POV from Home.  Seeks IUD removal. Seen today and states they were unable to locate her cervix.   NAD Noted during Triage. A&Ox4. GCS 15. Ambulatory.

## 2023-06-18 NOTE — Patient Instructions (Signed)
It was wonderful to see you today. Thank you for allowing me to be a part of your care. Below is a short summary of what we discussed at your visit today:  Unfortunately we were unable to get your IUD removed today.  We have sent a referral to gynecologist.  They will call you to schedule an appointment.  I have also sent in prescription for Diflucan for your fungus infection.  Please bring all of your medications to every appointment!  If you have any questions or concerns, please do not hesitate to contact us via phone or MyChart message.   Jerre Simon, MD [facility]

## 2023-06-18 NOTE — Discharge Instructions (Signed)
Make sure to follow-up with OB/GYN for removal of your IUD  Return for new or worsening symptoms

## 2023-06-18 NOTE — ED Provider Notes (Addendum)
[place] EMERGENCY DEPARTMENT AT [facility] Provider Note   CSN: [id number] Arrival date & time: 06/18/23  1527    History  Chief Complaint  Patient presents with   IUD Removal    Connie Jenkins is a 42 y.o. female here for evaluation of IUD removal.  Had IUD placed 06/05/2023 at PCP office.  States she has had cramping and discomfort to her lower abdomen since placement.  She has had some vaginal bleeding her denies any large clots, having to her tampons or pads.  She was seen by PCP earlier today.  Per their note had difficulty visualizing the cervix initially however once visualized cervix today noted they could not see her strings from her IUD.  They were placing referral to OB/GYN for further evaluation.  States she is here to have her IUD removed.  No dysuria, hematuria, focal RLQ or LLQ pain.  No fever, nausea, vomiting, change in bowel movements, bloody stool.  HPI     Home Medications Prior to Admission medications   Medication Sig Start Date End Date Taking? Authorizing Provider  naproxen (NAPROSYN) 500 MG tablet Take 1 tablet (500 mg total) by mouth 2 (two) times daily. 06/18/23  Yes Nahara Dona A, PA-C  fluconazole (DIFLUCAN) 150 MG tablet Take 1 tablet (150 mg total) by mouth once for 1 dose. 06/18/23 06/18/23  Jerre Simon, MD  meloxicam (MOBIC) 7.5 MG tablet Take 1 tablet (7.5 mg total) by mouth daily. 04/09/23   Lenn Sink, DPM      Allergies    Latex and Penicillins    Review of Systems   Review of Systems  Constitutional: Negative.   HENT: Negative.    Respiratory: Negative.    Cardiovascular: Negative.   Gastrointestinal:  Positive for abdominal pain.  Genitourinary:  Positive for vaginal bleeding. Negative for decreased urine volume, difficulty urinating, dyspareunia, dysuria, enuresis, flank pain, frequency, genital sores, hematuria, menstrual problem, pelvic pain, urgency, vaginal discharge and vaginal pain.  Neurological: Negative.    All other systems reviewed and are negative.   Physical Exam Updated Vital Signs BP 116/65   Pulse 87   Temp 98.4 F (36.9 C) (Temporal)   Resp 16   Ht 5\' 2"  (1.575 m)   Wt 103.4 kg   LMP 05/28/2023 (Exact Date)   SpO2 100%   BMI 41.69 kg/m  Physical Exam Vitals and nursing note reviewed.  Constitutional:      General: She is not in acute distress.    Appearance: She is well-developed. She is not ill-appearing, toxic-appearing or diaphoretic.  HENT:     Head: Atraumatic.  Eyes:     Pupils: Pupils are equal, round, and reactive to light.  Cardiovascular:     Rate and Rhythm: Normal rate.     Pulses: Normal pulses.     Heart sounds: Normal heart sounds.  Pulmonary:     Effort: No respiratory distress.  Abdominal:     General: Bowel sounds are normal. There is no distension.     Palpations: Abdomen is soft.     Tenderness: There is no abdominal tenderness. There is no right CVA tenderness, left CVA tenderness or guarding. Negative signs include McBurney's sign.  Genitourinary:    Comments: Deferred performed at PCP office PTA Musculoskeletal:        General: Normal range of motion.     Cervical back: Normal range of motion.  Skin:    General: Skin is warm and dry.  Capillary Refill: Capillary refill takes less than 2 seconds.  Neurological:     General: No focal deficit present.     Mental Status: She is alert and oriented to person, place, and time.  Psychiatric:        Mood and Affect: Mood normal.     ED Results / Procedures / Treatments   Labs (all labs ordered are listed, but only abnormal results are displayed) Labs Reviewed  URINALYSIS, ROUTINE W REFLEX MICROSCOPIC - Abnormal; Notable for the following components:      Result Value   Color, Urine STRAW (*)    Hgb urine dipstick LARGE (*)    Leukocytes,Ua SMALL (*)    All other components within normal limits  URINALYSIS, MICROSCOPIC (REFLEX) - Abnormal; Notable for the following components:    Bacteria, UA RARE (*)    All other components within normal limits  HCG, SERUM, QUALITATIVE    EKG None  Radiology US PELVIC COMPLETE W TRANSVAGINAL AND TORSION R/O  Result Date: 06/18/2023 CLINICAL DATA:  Pelvic pain and lost IUD strings, initial encounter EXAM: TRANSABDOMINAL AND TRANSVAGINAL ULTRASOUND OF PELVIS DOPPLER ULTRASOUND OF OVARIES TECHNIQUE: Both transabdominal and transvaginal ultrasound examinations of the pelvis were performed. Transabdominal technique was performed for global imaging of the pelvis including uterus, ovaries, adnexal regions, and pelvic cul-de-sac. It was necessary to proceed with endovaginal exam following the transabdominal exam to visualize the ovaries. Color and duplex Doppler ultrasound was utilized to evaluate blood flow to the ovaries. COMPARISON:  CT from 02/13/2023 FINDINGS: Uterus Measurements: 9.7 x 4.4 x 5.6 cm. = volume: 125 mL. No fibroids or other mass visualized. Endometrium IUD is noted within the endometrial canal. Right ovary Measurements: 2.2 x 1.3 x 0.9 cm. = volume: 0.3 mL. Normal appearance/no adnexal mass. Left ovary Measurements: 3.4 x 2.3 x 3.1 cm. = volume: 12.7 mL. 2.2 cm simple appearing cyst is noted. Pulsed Doppler evaluation of both ovaries demonstrates normal low-resistance arterial and venous waveforms. Other findings No abnormal free fluid. IMPRESSION: IUD within the uterus.  No acute abnormality is noted. Left ovarian follicle measuring 2.2 cm, normal finding. No follow-up imaging is recommended. Reference: Radiology 2019 Nov;293(2):359-371 Electronically Signed   By: Alcide Clever M.D.   On: 06/18/2023 19:36    Procedures Procedures    Medications Ordered in ED Medications  HYDROcodone-acetaminophen (NORCO/VICODIN) 5-325 MG per tablet 1 tablet (1 tablet Oral Given 06/18/23 1849)   ED Course/ Medical Decision Making/ A&P   42 year old here for evaluation of IUD removal.  Has had lower abdominal cramping and vaginal bleeding  since having placed on 06/05/2023 with PCP.  She went to clinic to have removed today they were unable to visualize her IUD strings.  They sent referral to OB/GYN however patient came here to see if she could have IUD removed here.  Admits to some generalized lower abdominal cramping right greater than left.  No change in bowel movements.  No bloody stool.  No UTI symptoms.  She denies any chance of pregnancy.  Cultures obtained at PCP office PTA which showed yeast infection.  Labs and imaging personally viewed and interpreted:  Pregnancy test negative UA negative for infection Ultrasound IUD in uterus.  No complications.  No torsion  Discussed results with patient.  Refer outpatient to OB/GYN for IUD removal.   Patient is nontoxic, nonseptic appearing, in no apparent distress.  Patient's pain and other symptoms adequately managed in emergency department.  Fluid bolus given.  Labs, imaging and vitals reviewed.  Patient does not meet the SIRS or Sepsis criteria.  On repeat exam patient does not have a surgical abdomin and there are no peritoneal signs.  No indication of appendicitis, bowel obstruction, bowel perforation, cholecystitis, diverticulitis, PID, TOA, torsion or ectopic pregnancy.  Patient discharged home with symptomatic treatment and given strict instructions for follow-up with their primary care physician.  I have also discussed reasons to return immediately to the ER.  Patient expresses understanding and agrees with plan.                             Medical Decision Making Amount and/or Complexity of Data Reviewed External Data Reviewed: labs, radiology and notes. Labs: ordered. Decision-making details documented in ED Course. Radiology: ordered and independent interpretation performed. Decision-making details documented in ED Course.  Risk OTC drugs. Prescription drug management. Decision regarding hospitalization. Diagnosis or treatment significantly limited by social  determinants of health.          Final Clinical Impression(s) / ED Diagnoses Final diagnoses:  Pelvic pain  Complication of intrauterine device (IUD), unspecified complication, subsequent encounter    Rx / DC Orders ED Discharge Orders          Ordered    naproxen (NAPROSYN) 500 MG tablet  2 times daily        06/18/23 2021              Sumayah Bearse A, PA-C 06/18/23 2023    Ryer Asato A, PA-C 06/18/23 2024    Ernie Avena, MD 06/18/23 2234

## 2023-06-20 DIAGNOSIS — Z30432 Encounter for removal of intrauterine contraceptive device: Secondary | ICD-10-CM | POA: Diagnosis not present

## 2023-06-20 DIAGNOSIS — Z30013 Encounter for initial prescription of injectable contraceptive: Secondary | ICD-10-CM | POA: Diagnosis not present

## 2023-06-27 DIAGNOSIS — F4323 Adjustment disorder with mixed anxiety and depressed mood: Secondary | ICD-10-CM | POA: Diagnosis not present

## 2023-07-06 DIAGNOSIS — F4323 Adjustment disorder with mixed anxiety and depressed mood: Secondary | ICD-10-CM | POA: Diagnosis not present

## 2023-07-12 DIAGNOSIS — F4323 Adjustment disorder with mixed anxiety and depressed mood: Secondary | ICD-10-CM | POA: Diagnosis not present

## 2023-07-20 DIAGNOSIS — F4323 Adjustment disorder with mixed anxiety and depressed mood: Secondary | ICD-10-CM | POA: Diagnosis not present

## 2023-07-27 DIAGNOSIS — F4323 Adjustment disorder with mixed anxiety and depressed mood: Secondary | ICD-10-CM | POA: Diagnosis not present

## 2023-08-03 DIAGNOSIS — F4323 Adjustment disorder with mixed anxiety and depressed mood: Secondary | ICD-10-CM | POA: Diagnosis not present

## 2023-08-08 DIAGNOSIS — F4323 Adjustment disorder with mixed anxiety and depressed mood: Secondary | ICD-10-CM | POA: Diagnosis not present

## 2023-08-16 DIAGNOSIS — N898 Other specified noninflammatory disorders of vagina: Secondary | ICD-10-CM | POA: Diagnosis not present

## 2023-08-16 DIAGNOSIS — N760 Acute vaginitis: Secondary | ICD-10-CM | POA: Diagnosis not present

## 2023-08-16 DIAGNOSIS — R8290 Unspecified abnormal findings in urine: Secondary | ICD-10-CM | POA: Diagnosis not present

## 2023-08-16 DIAGNOSIS — F4323 Adjustment disorder with mixed anxiety and depressed mood: Secondary | ICD-10-CM | POA: Diagnosis not present

## 2023-08-21 DIAGNOSIS — F4323 Adjustment disorder with mixed anxiety and depressed mood: Secondary | ICD-10-CM | POA: Diagnosis not present

## 2023-08-28 DIAGNOSIS — Z113 Encounter for screening for infections with a predominantly sexual mode of transmission: Secondary | ICD-10-CM | POA: Diagnosis not present

## 2023-08-28 DIAGNOSIS — F4323 Adjustment disorder with mixed anxiety and depressed mood: Secondary | ICD-10-CM | POA: Diagnosis not present

## 2023-08-28 DIAGNOSIS — R309 Painful micturition, unspecified: Secondary | ICD-10-CM | POA: Diagnosis not present

## 2023-08-28 DIAGNOSIS — Z30013 Encounter for initial prescription of injectable contraceptive: Secondary | ICD-10-CM | POA: Diagnosis not present

## 2023-09-05 DIAGNOSIS — F4323 Adjustment disorder with mixed anxiety and depressed mood: Secondary | ICD-10-CM | POA: Diagnosis not present

## 2023-09-12 DIAGNOSIS — F4323 Adjustment disorder with mixed anxiety and depressed mood: Secondary | ICD-10-CM | POA: Diagnosis not present

## 2023-09-19 DIAGNOSIS — F4323 Adjustment disorder with mixed anxiety and depressed mood: Secondary | ICD-10-CM | POA: Diagnosis not present

## 2023-09-27 DIAGNOSIS — F4323 Adjustment disorder with mixed anxiety and depressed mood: Secondary | ICD-10-CM | POA: Diagnosis not present

## 2023-10-03 DIAGNOSIS — F4323 Adjustment disorder with mixed anxiety and depressed mood: Secondary | ICD-10-CM | POA: Diagnosis not present

## 2023-10-04 ENCOUNTER — Other Ambulatory Visit (HOSPITAL_COMMUNITY)
Admission: RE | Admit: 2023-10-04 | Discharge: 2023-10-04 | Disposition: A | Discharge status: Home / Self Care | Payer: Medicaid Other | Source: Ambulatory Visit | Attending: Family Medicine | Admitting: Family Medicine

## 2023-10-04 ENCOUNTER — Ambulatory Visit: Payer: Medicaid Other | Admitting: Podiatry

## 2023-10-04 ENCOUNTER — Ambulatory Visit (INDEPENDENT_AMBULATORY_CARE_PROVIDER_SITE_OTHER): Payer: Medicaid Other | Admitting: Student

## 2023-10-04 ENCOUNTER — Encounter: Payer: Self-pay | Admitting: Podiatry

## 2023-10-04 VITALS — BP 118/83 | HR 70 | Ht 62.0 in | Wt 228.4 lb

## 2023-10-04 VITALS — Ht 62.0 in | Wt 228.4 lb

## 2023-10-04 DIAGNOSIS — M722 Plantar fascial fibromatosis: Secondary | ICD-10-CM | POA: Diagnosis not present

## 2023-10-04 DIAGNOSIS — N898 Other specified noninflammatory disorders of vagina: Secondary | ICD-10-CM | POA: Diagnosis not present

## 2023-10-04 DIAGNOSIS — E041 Nontoxic single thyroid nodule: Secondary | ICD-10-CM | POA: Diagnosis not present

## 2023-10-04 DIAGNOSIS — B009 Herpesviral infection, unspecified: Secondary | ICD-10-CM | POA: Diagnosis not present

## 2023-10-04 LAB — POCT WET PREP (WET MOUNT)
Clue Cells Wet Prep Whiff POC: NEGATIVE
Trichomonas Wet Prep HPF POC: ABSENT

## 2023-10-04 LAB — POCT URINALYSIS DIP (CLINITEK)
Bilirubin, UA: NEGATIVE
Glucose, UA: NEGATIVE mg/dL
Ketones, POC UA: NEGATIVE mg/dL
Leukocytes, UA: NEGATIVE
Nitrite, UA: NEGATIVE
POC PROTEIN,UA: NEGATIVE
Spec Grav, UA: >=1.03 — AB (ref 1.010–1.025)
Urobilinogen, UA: 0.2 U/dL
pH, UA: 5.5 (ref 5.0–8.0)

## 2023-10-04 LAB — POCT UA - MICROSCOPIC ONLY
Epithelial cells, urine per micros: >20
WBC, Ur, HPF, POC: NONE SEEN (ref 0–5)

## 2023-10-04 MED ORDER — TRIAMCINOLONE ACETONIDE 10 MG/ML IJ SUSP
10.0000 mg | Freq: Once | INTRAMUSCULAR | Status: AC
Start: 2023-10-04 — End: 2023-10-04
  Administered 2023-10-04: 10 mg via INTRA_ARTICULAR

## 2023-10-04 MED ORDER — VALACYCLOVIR HCL 500 MG PO TABS: ORAL_TABLET | ORAL | 0 refills | Status: AC

## 2023-10-04 NOTE — Assessment & Plan Note (Signed)
No evidence of current outbreak on exam today. Discussed signs and symptoms to watch out for. Valtrex 500 mg represcribed today to take within 24 hours of signs or symptoms of outbreak

## 2023-10-04 NOTE — Assessment & Plan Note (Signed)
Copious white discharge on exam today, otherwise no abnormalities. Gonorrhea/chlamydia/trichomonas/BV and yeast collected today HIV and RPR blood draw collected as well Will follow-up on results

## 2023-10-04 NOTE — Progress Notes (Signed)
Subjective:   Patient ID: Connie Jenkins, female   DOB: 42 y.o.   MRN: [id number]   HPI Patient presents with chronic pain in both heels that have done well for close to 6 months   ROS      Objective:  Physical Exam  Ocular status intact pain in the plantar heel region bilateral     Assessment:  Chronic Planter fasciitis bilateral with pain     Plan:  H&P reviewed sterile prep injected the plantar fascia bilateral 3 mg Kenalog 5 mg Xylocaine reappoint as needed made recommendations for shoe gear modifications

## 2023-10-04 NOTE — Assessment & Plan Note (Addendum)
Repeat TSH today with reflex to T4

## 2023-10-04 NOTE — Progress Notes (Signed)
    SUBJECTIVE:   CHIEF COMPLAINT / HPI:   Connie Jenkins is a 42 year-old female here to discuss concern for UTI and desire for STI screening, as well as discussing her thyroid.  Vaginal itching/sore She is worried she has an "outbreak" due to a couple sores she feels on her labia- a little bit of stinging. 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 Diagnosed with trichomonas No change in vaginal discharge, no irritation. Does have a little bit of itching.  Having a little bit of discomfort when urinating as well. No fever, chills, or flank pain.  Prior IUD has been removed- she did not like it- had a lot of cramping. She is now using Depo-Provera.  History of thyroid nodule Wants her thyroid labs checked. Last checked in March 2024- TSH 4.720 with T4 0.99.  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 External vulva and vagina nonerythematous, without any obvious lesions or rash. No ulcers/areas of concern on labia or perineal area. Copious white tinted discharge appreciated.  Normal ruggae of vaginal walls.  Cervix is non erythematous and non-friable.    ASSESSMENT/PLAN:   HSV (herpes simplex virus) infection No evidence of current outbreak on exam today. Discussed signs and symptoms to watch out for. Valtrex 500 mg represcribed today to take within 24 hours of signs or symptoms of outbreak  Vaginal itching Copious white discharge on exam today, otherwise no abnormalities. Gonorrhea/chlamydia/trichomonas/BV and yeast collected today HIV and RPR blood draw collected as well Will follow-up on results  Thyroid nodule Repeat TSH today with reflex to T4     Darral Dash, DO [facility]

## 2023-10-04 NOTE — Patient Instructions (Signed)
It was great seeing you today.  As we discussed, -I sent Valtrex prescription to your pharmacy to be taken within 24 hours of signs and symptoms of outbreak. -We collected testing today, I will call you if anything is abnormal   If you have any questions or concerns, please feel free to call the clinic.   Have a wonderful day,  Dr. Darral Dash [facility] [phone]

## 2023-10-05 ENCOUNTER — Encounter: Payer: Self-pay | Admitting: Student

## 2023-10-05 LAB — CERVICOVAGINAL ANCILLARY ONLY
Bacterial Vaginitis (gardnerella): POSITIVE — AB
Chlamydia: NEGATIVE
Comment: NEGATIVE
Comment: NEGATIVE
Comment: NEGATIVE
Comment: NORMAL
Neisseria Gonorrhea: NEGATIVE
Trichomonas: NEGATIVE

## 2023-10-05 LAB — TREPONEMAL ANTIBODIES, TPPA: Treponemal Antibodies, TPPA: NONREACTIVE

## 2023-10-05 LAB — RPR W/REFLEX TO TREPSURE: RPR: NONREACTIVE

## 2023-10-05 LAB — TSH RFX ON ABNORMAL TO FREE T4: TSH: 1.84 u[IU]/mL (ref 0.450–4.500)

## 2023-10-05 LAB — HIV ANTIBODY (ROUTINE TESTING W REFLEX): HIV Screen 4th Generation wRfx: NONREACTIVE

## 2023-10-08 ENCOUNTER — Other Ambulatory Visit: Payer: Self-pay | Admitting: Student

## 2023-10-08 MED ORDER — METRONIDAZOLE 500 MG PO TABS: 500.0000 mg | ORAL_TABLET | Freq: Two times a day (BID) | ORAL | 0 refills | Status: DC

## 2023-10-10 DIAGNOSIS — F4323 Adjustment disorder with mixed anxiety and depressed mood: Secondary | ICD-10-CM | POA: Diagnosis not present

## 2023-10-19 DIAGNOSIS — F4323 Adjustment disorder with mixed anxiety and depressed mood: Secondary | ICD-10-CM | POA: Diagnosis not present

## 2023-10-31 DIAGNOSIS — F4323 Adjustment disorder with mixed anxiety and depressed mood: Secondary | ICD-10-CM | POA: Diagnosis not present

## 2023-11-01 ENCOUNTER — Encounter: Payer: Self-pay | Admitting: Family Medicine

## 2023-11-01 ENCOUNTER — Ambulatory Visit (HOSPITAL_COMMUNITY)
Admission: RE | Admit: 2023-11-01 | Discharge: 2023-11-01 | Disposition: A | Discharge status: Home / Self Care | Payer: Medicaid Other | Source: Ambulatory Visit | Attending: Family Medicine | Admitting: Family Medicine

## 2023-11-01 ENCOUNTER — Encounter (HOSPITAL_COMMUNITY): Payer: Self-pay

## 2023-11-01 ENCOUNTER — Other Ambulatory Visit: Payer: Self-pay

## 2023-11-01 ENCOUNTER — Ambulatory Visit: Payer: Medicaid Other | Admitting: Family Medicine

## 2023-11-01 ENCOUNTER — Emergency Department (HOSPITAL_COMMUNITY)
Admission: EM | Admit: 2023-11-01 | Discharge: 2023-11-01 | Discharge status: Against Medical Advice | Payer: Medicaid Other | Attending: Emergency Medicine | Admitting: Emergency Medicine

## 2023-11-01 ENCOUNTER — Emergency Department (HOSPITAL_COMMUNITY): Payer: Medicaid Other

## 2023-11-01 VITALS — BP 136/85 | HR 88 | Temp 98.5°F | Ht 62.0 in | Wt 229.8 lb

## 2023-11-01 DIAGNOSIS — R079 Chest pain, unspecified: Secondary | ICD-10-CM | POA: Diagnosis not present

## 2023-11-01 DIAGNOSIS — R0789 Other chest pain: Secondary | ICD-10-CM | POA: Diagnosis not present

## 2023-11-01 DIAGNOSIS — R030 Elevated blood-pressure reading, without diagnosis of hypertension: Secondary | ICD-10-CM | POA: Insufficient documentation

## 2023-11-01 DIAGNOSIS — R519 Headache, unspecified: Secondary | ICD-10-CM | POA: Diagnosis not present

## 2023-11-01 DIAGNOSIS — R0902 Hypoxemia: Secondary | ICD-10-CM | POA: Diagnosis not present

## 2023-11-01 DIAGNOSIS — R0689 Other abnormalities of breathing: Secondary | ICD-10-CM | POA: Diagnosis not present

## 2023-11-01 DIAGNOSIS — Z5321 Procedure and treatment not carried out due to patient leaving prior to being seen by health care provider: Secondary | ICD-10-CM | POA: Diagnosis not present

## 2023-11-01 DIAGNOSIS — G4489 Other headache syndrome: Secondary | ICD-10-CM | POA: Diagnosis not present

## 2023-11-01 LAB — BASIC METABOLIC PANEL
Anion gap: 5 (ref 5–15)
BUN: 12 mg/dL (ref 6–20)
CO2: 26 mmol/L (ref 22–32)
Calcium: 9.2 mg/dL (ref 8.9–10.3)
Chloride: 108 mmol/L (ref 98–111)
Creatinine, Ser: 0.94 mg/dL (ref 0.44–1.00)
GFR, Estimated: >60 mL/min (ref >60)
Glucose, Bld: 86 mg/dL (ref 70–99)
Potassium: 4.3 mmol/L (ref 3.5–5.1)
Sodium: 139 mmol/L (ref 135–145)

## 2023-11-01 LAB — CBC
HCT: 39.6 % (ref 36.0–46.0)
Hemoglobin: 12.2 g/dL (ref 12.0–15.0)
MCH: 26.1 pg (ref 26.0–34.0)
MCHC: 30.8 g/dL (ref 30.0–36.0)
MCV: 84.6 fL (ref 80.0–100.0)
Platelets: 309 10*3/uL (ref 150–400)
RBC: 4.68 MIL/uL (ref 3.87–5.11)
RDW: 14.2 % (ref 11.5–15.5)
WBC: 5.8 10*3/uL (ref 4.0–10.5)
nRBC: 0 % (ref 0.0–0.2)

## 2023-11-01 LAB — TROPONIN I (HIGH SENSITIVITY): Troponin I (High Sensitivity): <2 ng/L (ref <18)

## 2023-11-01 LAB — HCG, SERUM, QUALITATIVE: Preg, Serum: NEGATIVE

## 2023-11-01 MED ORDER — ASPIRIN 325 MG PO TABS
325.0000 mg | ORAL_TABLET | Freq: Once | ORAL | Status: AC
Start: 2023-11-01 — End: 2023-11-01
  Administered 2023-11-01: 325 mg via ORAL

## 2023-11-01 MED ORDER — NITROGLYCERIN 0.4 MG SL SUBL
0.4000 mg | SUBLINGUAL_TABLET | Freq: Once | SUBLINGUAL | Status: AC
Start: 2023-11-01 — End: 2023-11-01
  Administered 2023-11-01: 0.4 mg via SUBLINGUAL

## 2023-11-01 NOTE — Patient Instructions (Signed)
I hate to hear you have been dealing with this!  Because your blood pressure has been so high, and because you are having chest pain and numbness/tingling in your left arm, I recommend going to the emergency room for further evaluation.

## 2023-11-01 NOTE — Progress Notes (Signed)
    SUBJECTIVE:   CHIEF COMPLAINT / HPI:   Feeling bad with elevated blood pressure Has been experiencing headaches, blurry vision, shortness of breath, chest pain, and numbness/tingling over the last month.  About a week ago, she took her blood pressure with a wrist monitor and it was 220s over 160s.  Another reading she has was 180s over 80s.  She does not have a history of hypertension.  She has been under a lot of stress recently with family matters.  She is currently having chest pain today.  She is also continuing to feel numbness/tingling in her left upper extremity.  No headache today.  She continues to have blurry vision, though she feels she may have had this before this period.  PERTINENT  PMH / PSH: Migraines, fatigue, PTSD, anxiety  OBJECTIVE:   BP 136/85   Pulse 88   Temp 98.5 F (36.9 C)   Ht 5\' 2"  (1.575 m)   Wt 229 lb 12.8 oz (104.2 kg)   LMP  (LMP Unknown)   SpO2 100%   BMI 42.03 kg/m   General: Alert and oriented, in NAD Skin: Warm, dry, and intact without lesions HEENT: NCAT, EOM grossly normal, midline nasal septum Cardiac: RRR, no m/r/g appreciated Respiratory: CTAB, breathing and speaking comfortably on RA Abdominal: Soft, normoactive bowel sounds Extremities: Moves all extremities grossly equally Neurological: Strength 5 out of 5 throughout, sensation overall intact though more numb in the entire left upper extremity, cranial nerves II through XII intact Psychiatric: Appropriate mood and affect   ASSESSMENT/PLAN:   Chest pain Not improving.  EKG collected in office with normal sinus rhythm though with new left axis deviation.  No evidence of STEMI.  Given current chest pain, along with persistent left upper extremity numbness/tingling in the context of recent severe range blood pressures, advise she should go to the emergency department for further evaluation with likely head imaging and troponins.  Administered aspirin 325 mg once to patient.  Also  administered nitroglycerin 0.4 mg sublingually with improvement in chest pain.  Called ED charge RN to give report.  EMS to transport patient to ED.  Patient amenable.  Janeal Holmes, MD [facility]

## 2023-11-01 NOTE — ED Notes (Signed)
Pt LWBS, despite being advised not to. Dragging OTF.

## 2023-11-01 NOTE — ED Provider Triage Note (Signed)
Emergency Medicine Provider Triage Evaluation Note  Connie Jenkins , a 42 y.o. female  was evaluated in triage.  Pt complains of chest pain and intermittent high blood pressure has been ongoing for 2 and half weeks.  Went to an urgent care took an EKG and recommended she come to the emergency department..  Review of Systems    Physical Exam  BP 112/84 (BP Location: Left Arm)   Pulse 81   Temp 98 F (36.7 C) (Oral)   Resp 16   LMP  (LMP Unknown)   SpO2 100%  Gen:   Awake, no distress   Resp:  Normal effort  MSK:   Moves extremities without difficulty  Other:    Medical Decision Making  Medically screening exam initiated at 5:25 PM.  Appropriate orders placed.  [name] was informed that the remainder of the evaluation will be completed by another provider, this initial triage assessment does not replace that evaluation, and the importance of remaining in the ED until their evaluation is complete.  Nonischemic EKG.  Normal vital signs.  Patient safe for lobby while blood work is in process.   Anders Simmonds T, DO 11/01/23 1725

## 2023-11-01 NOTE — ED Triage Notes (Signed)
Pt to ED BIB EMS from PCP with complaint of CP - abnormal EKG at PCP (No abnormalities on EKG for EMS). Given 324mg  ASA and 0.4mg  nitroglycerin at PCP - reports CP has resolved at this time. C/o HA s/p nitroglycerin administration.

## 2023-11-01 NOTE — Assessment & Plan Note (Addendum)
Not improving.  EKG collected in office with normal sinus rhythm though with new left axis deviation.  No evidence of STEMI.  Given current chest pain, along with persistent left upper extremity numbness/tingling in the context of recent severe range blood pressures, advise she should go to the emergency department for further evaluation with likely head imaging and troponins.  Administered aspirin 325 mg once to patient.  Also administered nitroglycerin 0.4 mg sublingually with improvement in chest pain.  Called ED charge RN to give report.  EMS to transport patient to ED.  Patient amenable.

## 2023-11-07 DIAGNOSIS — G44201 Tension-type headache, unspecified, intractable: Secondary | ICD-10-CM | POA: Diagnosis not present

## 2023-11-07 DIAGNOSIS — F4323 Adjustment disorder with mixed anxiety and depressed mood: Secondary | ICD-10-CM | POA: Diagnosis not present

## 2023-11-20 DIAGNOSIS — E785 Hyperlipidemia, unspecified: Secondary | ICD-10-CM | POA: Diagnosis not present

## 2023-12-03 DIAGNOSIS — F4323 Adjustment disorder with mixed anxiety and depressed mood: Secondary | ICD-10-CM | POA: Diagnosis not present

## 2023-12-06 ENCOUNTER — Encounter: Payer: Self-pay | Admitting: Student

## 2023-12-06 ENCOUNTER — Other Ambulatory Visit (HOSPITAL_COMMUNITY)
Admission: RE | Admit: 2023-12-06 | Discharge: 2023-12-06 | Disposition: A | Discharge status: Home / Self Care | Payer: Medicaid Other | Source: Ambulatory Visit | Attending: Family Medicine | Admitting: Family Medicine

## 2023-12-06 ENCOUNTER — Ambulatory Visit (INDEPENDENT_AMBULATORY_CARE_PROVIDER_SITE_OTHER): Payer: Medicaid Other | Admitting: Student

## 2023-12-06 VITALS — BP 121/80 | HR 96 | Ht 62.0 in | Wt 236.6 lb

## 2023-12-06 DIAGNOSIS — E785 Hyperlipidemia, unspecified: Secondary | ICD-10-CM | POA: Insufficient documentation

## 2023-12-06 DIAGNOSIS — N898 Other specified noninflammatory disorders of vagina: Secondary | ICD-10-CM | POA: Diagnosis not present

## 2023-12-06 DIAGNOSIS — Z113 Encounter for screening for infections with a predominantly sexual mode of transmission: Secondary | ICD-10-CM | POA: Insufficient documentation

## 2023-12-06 LAB — POCT URINE PREGNANCY: Preg Test, Ur: NEGATIVE

## 2023-12-06 NOTE — Progress Notes (Signed)
  SUBJECTIVE:   CHIEF COMPLAINT / HPI:   Requests STD screening today. She has been having stomach cramps. She got off depo in October. She has not had a period yet. Endorses vaginal discharge and itching. She is sexually active and is not on any form of contraception including condom use.   She saw Dr. Levern regarding her high blood pressures (at home) and was advised that her cholesterol is high and was placed on rosuvastatin 20mg  daily.   PERTINENT  PMH / PSH: Anxiety, migraines, PTSD  OBJECTIVE:  BP 121/80   Pulse 96   Ht 5' 2 (1.575 m)   Wt 236 lb 9.6 oz (107.3 kg)   SpO2 100%   BMI 43.27 kg/m  General: Well-appearing, NAD GU: deferred by patient  ASSESSMENT/PLAN:   Assessment & Plan Vaginal discharge STD screening.  Pregnancy test negative.  Discussed MVI with folate given not currently on any contraception. Hyperlipidemia, unspecified hyperlipidemia type Advised to continue statin per cardiology and discussed that labs should be rechecked in 2 to 3 months. Return if symptoms worsen or fail to improve. Kieth Johnson, DO 12/06/2023, 5:13 PM PGY-3, [facility]

## 2023-12-06 NOTE — Assessment & Plan Note (Addendum)
 Advised to continue statin per cardiology and discussed that labs should be rechecked in 2 to 3 months.

## 2023-12-06 NOTE — Assessment & Plan Note (Signed)
 STD screening.  Pregnancy test negative.  Discussed MVI with folate given not currently on any contraception.

## 2023-12-06 NOTE — Patient Instructions (Addendum)
 It was great to see you today! Thank you for choosing [facility] for your primary care.  Today we addressed: Please continue to take your Crestor.  I suspect your cardiologist will recheck your cholesterol in a few months.  Please also use an arm blood pressure cuff rather than wrist. Vaginal discharge: I highly recommend you use contraception as you are no longer on a form of birth control.  It is recommended that women who are of childbearing age and are not on a form of contraception that they take a multivitamin with folate should they become pregnant.  We have tested for gonorrhea/chlamydia/trichomonas.  You were also urine pregnancy tested which was negative.  If you haven't already, sign up for My Chart to have easy access to your labs results, and communication with your primary care physician.  Return if symptoms worsen or fail to improve. Please arrive 15 minutes before your appointment to ensure smooth check in process.  We appreciate your efforts in making this happen.  Thank you for allowing me to participate in your care, Kieth Johnson, DO 12/06/2023, 3:33 PM PGY-3, [facility]

## 2023-12-07 LAB — CERVICOVAGINAL ANCILLARY ONLY
Chlamydia: NEGATIVE
Comment: NEGATIVE
Comment: NEGATIVE
Comment: NORMAL
Neisseria Gonorrhea: NEGATIVE
Trichomonas: NEGATIVE

## 2023-12-13 DIAGNOSIS — F4323 Adjustment disorder with mixed anxiety and depressed mood: Secondary | ICD-10-CM | POA: Diagnosis not present

## 2023-12-20 DIAGNOSIS — F4323 Adjustment disorder with mixed anxiety and depressed mood: Secondary | ICD-10-CM | POA: Diagnosis not present

## 2023-12-25 ENCOUNTER — Ambulatory Visit: Payer: Medicaid Other | Admitting: Student

## 2023-12-25 VITALS — BP 126/80 | HR 82 | Ht 62.0 in | Wt 239.6 lb

## 2023-12-25 DIAGNOSIS — L309 Dermatitis, unspecified: Secondary | ICD-10-CM

## 2023-12-25 MED ORDER — HYDROCORTISONE 2.5 % EX OINT
TOPICAL_OINTMENT | Freq: Two times a day (BID) | CUTANEOUS | 3 refills | Status: AC
Start: 2023-12-25 — End: ?

## 2023-12-25 MED ORDER — TRIAMCINOLONE ACETONIDE 0.1 % EX CREA
1.0000 | TOPICAL_CREAM | Freq: Two times a day (BID) | CUTANEOUS | 0 refills | Status: AC | PRN
Start: 2023-12-25 — End: ?

## 2023-12-25 NOTE — Progress Notes (Signed)
  SUBJECTIVE:   CHIEF COMPLAINT / HPI:   Patient presents today requesting dermatology referral for her eczema.  In the past she has been seen by dermatology however she has not seen them for 2 years therefore needs another referral.  She was unaware that family medicine can manage this.  She states she has had an asthma flare on her arms and hands.  In the past she has used hydrocortisone for face and hands and triamcinolone for arms and large body surfaces such as her chest.  PERTINENT  PMH / PSH: Anxiety, migraines, PTSD, HLD  OBJECTIVE:  BP 126/80   Pulse 82   Ht 5\' 2"  (1.575 m)   Wt 239 lb 9.6 oz (108.7 kg)   SpO2 100%   BMI 43.82 kg/m  Gen: Well-appearing, NAD Skin:eczematous, xerotic surfaces of skin overlying right hand, and portions of right upper arm, hyperpigmented and xerotic surface of anterior chest consistent with atopic dermatitis  ASSESSMENT/PLAN:   Assessment & Plan Eczema, unspecified type Mild atopic dermatitis present mostly on upper extremities and chest, she is amenable to me managing this.  Hydrocortisone for small body surfaces and triamcinolone for large body surfaces strictly for flares.  Educated on overuse.  Discussed lukewarm bathing and applying moisturizer directly afterwards.  Regular moisturizing when not using steroids. Return if symptoms worsen or fail to improve. Shelby Mattocks, DO 12/25/2023, 2:03 PM PGY-3, [facility]

## 2023-12-25 NOTE — Patient Instructions (Addendum)
It was great to see you today! Thank you for choosing [facility] for your primary care.  Today we addressed: I have sent in hydrocortisone and triamcinolone to your pharmacy.  Please let me know if you should need refills.  Eczema can get better or worse depending on the time of year and sometimes without any trigger. The best treatment is prevention.   Prevent eczema flares by:  - Moisturize your skin 1-2 times a day EVERY day with a mild, unscented lotion such as Aveeno, CeraVe, Cetaphil or Eucerin. At night, let the lotion dry and then cover with a barrier ointment such as Vaseline or Aquaphor - In the bath, use a mild, unscented soap such as Dove - When washing clothes, use a fragrance-free laundry detergent  - On the face: Use Hydrocortisone 2.5% ointment for up to 5 days in a row.  - On the body: Use Triamcinolone 0.1% ointment for up to 5 days in a row.   Why can't I use steroid creams every day ?  - Regular use of steroid cream will make the skin color lighter  - There is a small amount of steroid that may get into the bloodstream from the skin   Online Resources: EczemaNet: An online eczema information resource sponsored by the [employer].  http://www.skincarephysicians.com/eczemanet/index.html  National Eczema Association for Science and Education (NEASE): NEASE is a Education officer, community. The site contains information for patients and families (primarily in [place] but some in [place]) and links to other resources.  FabricationGuide.ca   Please remember: If you have any questions regarding your child's condition or the use of medications please contact us.  If you haven't already, sign up for My Chart to have easy access to your labs results, and communication with your primary care physician.  Return if symptoms worsen or fail to improve. Please arrive 15 minutes before your appointment to ensure smooth check in  process.  We appreciate your efforts in making this happen.  Thank you for allowing me to participate in your care, Shelby Mattocks, DO 12/25/2023, 2:02 PM PGY-3, [facility]

## 2023-12-25 NOTE — Assessment & Plan Note (Signed)
Mild atopic dermatitis present mostly on upper extremities and chest, she is amenable to me managing this.  Hydrocortisone for small body surfaces and triamcinolone for large body surfaces strictly for flares.  Educated on overuse.  Discussed lukewarm bathing and applying moisturizer directly afterwards.  Regular moisturizing when not using steroids.

## 2023-12-31 DIAGNOSIS — F4323 Adjustment disorder with mixed anxiety and depressed mood: Secondary | ICD-10-CM | POA: Diagnosis not present

## 2024-01-07 DIAGNOSIS — F4323 Adjustment disorder with mixed anxiety and depressed mood: Secondary | ICD-10-CM | POA: Diagnosis not present

## 2024-01-08 DIAGNOSIS — B3731 Acute candidiasis of vulva and vagina: Secondary | ICD-10-CM | POA: Diagnosis not present

## 2024-01-08 DIAGNOSIS — H5213 Myopia, bilateral: Secondary | ICD-10-CM | POA: Diagnosis not present

## 2024-01-08 DIAGNOSIS — Z113 Encounter for screening for infections with a predominantly sexual mode of transmission: Secondary | ICD-10-CM | POA: Diagnosis not present

## 2024-01-08 DIAGNOSIS — Z30011 Encounter for initial prescription of contraceptive pills: Secondary | ICD-10-CM | POA: Diagnosis not present

## 2024-01-08 DIAGNOSIS — Z3202 Encounter for pregnancy test, result negative: Secondary | ICD-10-CM | POA: Diagnosis not present

## 2024-01-13 DIAGNOSIS — H66001 Acute suppurative otitis media without spontaneous rupture of ear drum, right ear: Secondary | ICD-10-CM | POA: Diagnosis not present

## 2024-01-13 DIAGNOSIS — H6001 Abscess of right external ear: Secondary | ICD-10-CM | POA: Diagnosis not present

## 2024-01-13 DIAGNOSIS — H9201 Otalgia, right ear: Secondary | ICD-10-CM | POA: Diagnosis not present

## 2024-01-18 DIAGNOSIS — F4310 Post-traumatic stress disorder, unspecified: Secondary | ICD-10-CM | POA: Diagnosis not present

## 2024-01-18 DIAGNOSIS — F331 Major depressive disorder, recurrent, moderate: Secondary | ICD-10-CM | POA: Diagnosis not present

## 2024-01-25 DIAGNOSIS — F331 Major depressive disorder, recurrent, moderate: Secondary | ICD-10-CM | POA: Diagnosis not present

## 2024-01-25 DIAGNOSIS — F4310 Post-traumatic stress disorder, unspecified: Secondary | ICD-10-CM | POA: Diagnosis not present

## 2024-02-01 DIAGNOSIS — F4310 Post-traumatic stress disorder, unspecified: Secondary | ICD-10-CM | POA: Diagnosis not present

## 2024-02-01 DIAGNOSIS — F331 Major depressive disorder, recurrent, moderate: Secondary | ICD-10-CM | POA: Diagnosis not present

## 2024-02-04 ENCOUNTER — Other Ambulatory Visit: Payer: Self-pay | Admitting: Student

## 2024-02-13 DIAGNOSIS — F4310 Post-traumatic stress disorder, unspecified: Secondary | ICD-10-CM | POA: Diagnosis not present

## 2024-02-13 DIAGNOSIS — F331 Major depressive disorder, recurrent, moderate: Secondary | ICD-10-CM | POA: Diagnosis not present

## 2024-02-20 ENCOUNTER — Ambulatory Visit: Admitting: Student

## 2024-02-20 ENCOUNTER — Encounter: Payer: Self-pay | Admitting: Student

## 2024-02-20 VITALS — BP 136/87 | HR 89 | Ht 62.0 in | Wt 238.4 lb

## 2024-02-20 DIAGNOSIS — E785 Hyperlipidemia, unspecified: Secondary | ICD-10-CM | POA: Diagnosis not present

## 2024-02-20 DIAGNOSIS — L309 Dermatitis, unspecified: Secondary | ICD-10-CM

## 2024-02-20 MED ORDER — CETIRIZINE HCL 10 MG PO TABS
10.0000 mg | ORAL_TABLET | Freq: Every day | ORAL | 1 refills | Status: AC
Start: 2024-02-20 — End: ?

## 2024-02-20 NOTE — Progress Notes (Signed)
  SUBJECTIVE:   CHIEF COMPLAINT / HPI:   The patient, with a history of eczema, presents with new and worsening symptoms. She reports that the eczema has spread to new areas including the hands, scalp, and abdomen, which is unusual for her. The scalp involvement is severe, described as 'cradle cap,' and has led to significant hair loss. She also reports an eye infection, which she suspects may be related to the scalp eczema.  She went to her optometrist who gave her Keflex, triple antibiotic ointment for the eye and methylprednisolone tablets and told her she has cataracts.  This is an optometrist at [employer].  She has been using triamcinolone on larger body surfaces and hydrocortisone on smaller areas like the face and hands. However, she reports that the hydrocortisone burns when applied to the hands. She has been using these treatments daily, but notes that the eczema clears up for only a few days before returning.  In addition to the eczema treatments, she has been using a triple antibiotic ointment for relief. She also reports that her usual moisturizer, CeraVe, now burns when applied to the eczema. She has not changed her bathing habits or soap use.  She also has a history of allergies, for which she takes Zyrtec. She reports needing a refill of this medication.  PERTINENT  PMH / PSH: Anxiety, migraines, PTSD, HLD   OBJECTIVE:  BP 136/87   Pulse 89   Ht 5\' 2"  (1.575 m)   Wt 238 lb 6.4 oz (108.1 kg)   SpO2 100%   BMI 43.60 kg/m  Skin: widespread xerosis on forehead, hypopigmentation and scaling of dorsal surface of 2nd finger or right hand and interdigit web space concerning for atrophy, scattered raised scaling plaques on abdomen        ASSESSMENT/PLAN:   Assessment & Plan Eczema, unspecified type Discontinue steroids given concern for overuse and atrophy. Extent and location of new areas does not fit eczema. Recommend continuous use of aquaphor and avoiding wigs. Use  Cetirizine to aid with pruritus. Derm referral placed. Recommend returning to resident skin/derm clinic for biopsy of abdominal lesions.  Return in about 2 weeks (around 03/05/2024) for Derm/skin clinic for biopsy. Shelby Mattocks, DO 02/20/2024, 12:24 PM PGY-3, [facility]

## 2024-02-20 NOTE — Assessment & Plan Note (Signed)
 Discontinue steroids given concern for overuse and atrophy. Extent and location of new areas does not fit eczema. Recommend continuous use of aquaphor and avoiding wigs. Use Cetirizine to aid with pruritus. Derm referral placed. Recommend returning to resident skin/derm clinic for biopsy of abdominal lesions.

## 2024-02-20 NOTE — Patient Instructions (Addendum)
 It was great to see you today! Thank you for choosing [facility] for your primary care.  Today we addressed: I will need you to stop using the steroids.  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  In the future, these medications should be used as the label states as needed.  I will represcribe Zyrtec as you will need some assistance with antihistamines to stop itching.  I highly recommend you use Aquaphor several times a day as many times as you need for the dry skin while you cannot use steroids.  I will place a Derm referral for you.  We should biopsy one of the spots on your abdomen as this is not classic eczema.  I would like you to return to our particular skin clinic in this office for that.  If you haven't already, sign up for My Chart to have easy access to your labs results, and communication with your primary care physician.  Return in about 2 weeks (around 03/05/2024) for Derm/skin clinic for biopsy. Please arrive 15 minutes before your appointment to ensure smooth check in process.  We appreciate your efforts in making this happen.  Thank you for allowing me to participate in your care, Shelby Mattocks, DO 02/20/2024, 10:56 AM PGY-3, [facility]

## 2024-02-21 DIAGNOSIS — F331 Major depressive disorder, recurrent, moderate: Secondary | ICD-10-CM | POA: Diagnosis not present

## 2024-02-21 DIAGNOSIS — F4310 Post-traumatic stress disorder, unspecified: Secondary | ICD-10-CM | POA: Diagnosis not present

## 2024-02-29 DIAGNOSIS — F4310 Post-traumatic stress disorder, unspecified: Secondary | ICD-10-CM | POA: Diagnosis not present

## 2024-02-29 DIAGNOSIS — F331 Major depressive disorder, recurrent, moderate: Secondary | ICD-10-CM | POA: Diagnosis not present

## 2024-02-29 DIAGNOSIS — B3731 Acute candidiasis of vulva and vagina: Secondary | ICD-10-CM | POA: Diagnosis not present

## 2024-02-29 DIAGNOSIS — Z113 Encounter for screening for infections with a predominantly sexual mode of transmission: Secondary | ICD-10-CM | POA: Diagnosis not present

## 2024-03-13 DIAGNOSIS — F331 Major depressive disorder, recurrent, moderate: Secondary | ICD-10-CM | POA: Diagnosis not present

## 2024-03-13 DIAGNOSIS — F4310 Post-traumatic stress disorder, unspecified: Secondary | ICD-10-CM | POA: Diagnosis not present

## 2024-03-17 ENCOUNTER — Emergency Department (HOSPITAL_BASED_OUTPATIENT_CLINIC_OR_DEPARTMENT_OTHER)
Admission: EM | Admit: 2024-03-17 | Discharge: 2024-03-18 | Disposition: A | Discharge status: Home / Self Care | Attending: Emergency Medicine | Admitting: Emergency Medicine

## 2024-03-17 ENCOUNTER — Other Ambulatory Visit: Payer: Self-pay

## 2024-03-17 ENCOUNTER — Encounter (HOSPITAL_BASED_OUTPATIENT_CLINIC_OR_DEPARTMENT_OTHER): Payer: Self-pay

## 2024-03-17 DIAGNOSIS — R591 Generalized enlarged lymph nodes: Secondary | ICD-10-CM

## 2024-03-17 DIAGNOSIS — Z202 Contact with and (suspected) exposure to infections with a predominantly sexual mode of transmission: Secondary | ICD-10-CM | POA: Diagnosis not present

## 2024-03-17 DIAGNOSIS — Z91040 Latex allergy status: Secondary | ICD-10-CM | POA: Diagnosis not present

## 2024-03-17 DIAGNOSIS — R590 Localized enlarged lymph nodes: Secondary | ICD-10-CM | POA: Insufficient documentation

## 2024-03-17 DIAGNOSIS — L292 Pruritus vulvae: Secondary | ICD-10-CM | POA: Insufficient documentation

## 2024-03-17 LAB — URINALYSIS, ROUTINE W REFLEX MICROSCOPIC
Bilirubin Urine: NEGATIVE
Glucose, UA: NEGATIVE mg/dL
Ketones, ur: NEGATIVE mg/dL
Leukocytes,Ua: NEGATIVE
Nitrite: NEGATIVE
Specific Gravity, Urine: 1.028 (ref 1.005–1.030)
pH: 6 (ref 5.0–8.0)

## 2024-03-17 LAB — CBC
HCT: 37.5 % (ref 36.0–46.0)
Hemoglobin: 12.1 g/dL (ref 12.0–15.0)
MCH: 26.5 pg (ref 26.0–34.0)
MCHC: 32.3 g/dL (ref 30.0–36.0)
MCV: 82.2 fL (ref 80.0–100.0)
Platelets: 315 10*3/uL (ref 150–400)
RBC: 4.56 MIL/uL (ref 3.87–5.11)
RDW: 13.5 % (ref 11.5–15.5)
WBC: 6.1 10*3/uL (ref 4.0–10.5)
nRBC: 0 % (ref 0.0–0.2)

## 2024-03-17 LAB — WET PREP, GENITAL
Clue Cells Wet Prep HPF POC: NONE SEEN
Sperm: NONE SEEN
Trich, Wet Prep: NONE SEEN
WBC, Wet Prep HPF POC: <10 (ref <10)
Yeast Wet Prep HPF POC: NONE SEEN

## 2024-03-17 LAB — COMPREHENSIVE METABOLIC PANEL WITH GFR
ALT: 16 U/L (ref 0–44)
AST: 31 U/L (ref 15–41)
Albumin: 3.9 g/dL (ref 3.5–5.0)
Alkaline Phosphatase: 45 U/L (ref 38–126)
Anion gap: 9 (ref 5–15)
BUN: 11 mg/dL (ref 6–20)
CO2: 24 mmol/L (ref 22–32)
Calcium: 9.6 mg/dL (ref 8.9–10.3)
Chloride: 104 mmol/L (ref 98–111)
Creatinine, Ser: 0.84 mg/dL (ref 0.44–1.00)
GFR, Estimated: >60 mL/min (ref >60)
Glucose, Bld: 90 mg/dL (ref 70–99)
Potassium: 3.6 mmol/L (ref 3.5–5.1)
Sodium: 137 mmol/L (ref 135–145)
Total Bilirubin: 0.3 mg/dL (ref 0.0–1.2)
Total Protein: 7.7 g/dL (ref 6.5–8.1)

## 2024-03-17 LAB — LIPASE, BLOOD: Lipase: 42 U/L (ref 11–51)

## 2024-03-17 LAB — PREGNANCY, URINE: Preg Test, Ur: NEGATIVE

## 2024-03-17 NOTE — ED Provider Triage Note (Signed)
 Emergency Medicine Provider Triage Evaluation Note  Connie Jenkins , a 43 y.o. female  was evaluated in triage.  Pt complains of left inguinal lymph node swelling, ongoing for about 3 weeks, has also had some vaginal itching..  Review of Systems  Positive:  Negative:   Physical Exam  BP 115/85   Pulse 77   Temp 97.9 F (36.6 C)   Resp 18   Ht 5\' 2"  (1.575 m)   Wt 108.9 kg   SpO2 98%   BMI 43.90 kg/m  Gen:   Awake, no distress   Resp:  Normal effort  MSK:   Moves extremities without difficulty  Other:    Medical Decision Making  Medically screening exam initiated at 10:38 PM.  Appropriate orders placed.  Connie Jenkins was informed that the remainder of the evaluation will be completed by another provider, this initial triage assessment does not replace that evaluation, and the importance of remaining in the ED until their evaluation is complete.  Feels like an inguinal lymph node on the left side.  Likely reactive.  Patient has a mild tenderness with this.  She does endorse vaginal itching.  Will obtain gonorrhea, chlamydia, wet prep.  Patient states she does not have any new sexual partners.   Afton Horse T, DO 03/17/24 2239

## 2024-03-17 NOTE — ED Provider Notes (Signed)
 [place] EMERGENCY DEPARTMENT AT [facility]  Provider Note  CSN: [id number] Arrival date & time: 03/17/24 1846  History Chief Complaint  Patient presents with   Lymphadenopathy         ESMAE DONATHAN is a 43 y.o. female here for evaluation of a painful knot in her L groin. First noticed it about 3 weeks ago, saw PCP and told it was probably a benign lymph node that would go away. Four days ago she began having pain in that area and became more concerned. She has not had any fever,  nausea, vomiting, diarrhea, dysuria. No abdominal pain. She reports some vaginal itching but not discharge. She was advised by PCP to come to the ED for evaluation   Home Medications Prior to Admission medications   Medication Sig Start Date End Date Taking? Authorizing Provider  doxycycline  (VIBRAMYCIN ) 100 MG capsule Take 1 capsule (100 mg total) by mouth 2 (two) times daily. 03/18/24  Yes Charmayne Cooper, MD  cetirizine  (ZYRTEC ) 10 MG tablet Take 1 tablet (10 mg total) by mouth daily. 02/20/24   Veronia Goon, DO  hydrocortisone  2.5 % ointment Apply topically 2 (two) times daily. As needed for mild eczema.  Do not use for more than 1-2 weeks at a time. 12/25/23   Veronia Goon, DO  rosuvastatin (CRESTOR) 20 MG tablet Take 20 mg by mouth at bedtime. 11/26/23   [provider]  triamcinolone  cream (KENALOG ) 0.1 % Apply 1 Application topically 2 (two) times daily as needed (eczema flares on large body surfaces. Avoid hands, face, feet, neck, genitals). 12/25/23   Veronia Goon, DO  valACYclovir  (VALTREX ) 500 MG tablet Take 1 tablet (500 mg) orally twice daily for 3 days; start within 24 hours of onset of signs and symptoms for outbreak 10/04/23   Vallorie Gayer, DO     Allergies    Latex and Penicillins   Review of Systems   Review of Systems Please see HPI for pertinent positives and negatives  Physical Exam BP 115/85   Pulse 77   Temp 97.9 F (36.6 C)   Resp 18   Ht 5'  2" (1.575 m)   Wt 108.9 kg   SpO2 98%   BMI 43.90 kg/m   Physical Exam Vitals and nursing note reviewed. Exam conducted with a chaperone present.  Constitutional:      Appearance: Normal appearance.  HENT:     Head: Normocephalic and atraumatic.     Nose: Nose normal.     Mouth/Throat:     Mouth: Mucous membranes are moist.  Eyes:     Extraocular Movements: Extraocular movements intact.     Conjunctiva/sclera: Conjunctivae normal.  Cardiovascular:     Rate and Rhythm: Normal rate.  Pulmonary:     Effort: Pulmonary effort is normal.     Breath sounds: Normal breath sounds.  Abdominal:     General: Abdomen is flat.     Palpations: Abdomen is soft.     Tenderness: There is no abdominal tenderness.  Genitourinary:    Comments: Single enlarged and tender lymph node in L inguinal area Musculoskeletal:        General: No swelling. Normal range of motion.     Cervical back: Neck supple.  Lymphadenopathy:     Cervical: No cervical adenopathy.  Skin:    General: Skin is warm and dry.  Neurological:     General: No focal deficit present.     Mental Status: She is alert.  Psychiatric:        Mood and Affect: Mood normal.     ED Results / Procedures / Treatments   EKG None  Procedures Procedures  Medications Ordered in the ED Medications  cefTRIAXone  (ROCEPHIN ) injection 500 mg (has no administration in time range)  doxycycline  (VIBRA -TABS) tablet 100 mg (has no administration in time range)    Initial Impression and Plan  Patient here with a single tender inguinal lymph node. She has some concern for STI based on her internet search. She prefers self-swab. Labs done in triage show normal CBC, CMP and lipase. HCG is neg. Will check UA and pelvic swabs.   ED Course   Clinical Course as of 03/18/24 0007  Mon Mar 17, 2024  2320 UA with some bacteria, but otherwise no signs of UTI.  [CS]  2327 Wet prep is negative. Will treat empirically for GC/CT. Recommend PCP follow  up for recheck if not improving. RTED for any other concerns.  [CS]    Clinical Course User Index [CS] Charmayne Cooper, MD     MDM Rules/Calculators/A&P Medical Decision Making Problems Addressed: Lymphadenopathy: acute illness or injury Possible exposure to STI: undiagnosed new problem with uncertain prognosis  Amount and/or Complexity of Data Reviewed Labs: ordered. Decision-making details documented in ED Course.  Risk Prescription drug management.     Final Clinical Impression(s) / ED Diagnoses Final diagnoses:  Lymphadenopathy  Possible exposure to STI    Rx / DC Orders ED Discharge Orders          Ordered    doxycycline  (VIBRAMYCIN ) 100 MG capsule  2 times daily        03/18/24 0006             Charmayne Cooper, MD 03/18/24 [phone]

## 2024-03-17 NOTE — ED Triage Notes (Signed)
 In for eval of LLQ abd pain/lymph node pain onset Thursday. First noticed if 02/29/2024. Slight nausea. Denies vomiting or diarrhea. Denies dysuria or hematuria.

## 2024-03-18 DIAGNOSIS — F331 Major depressive disorder, recurrent, moderate: Secondary | ICD-10-CM | POA: Diagnosis not present

## 2024-03-18 DIAGNOSIS — F4310 Post-traumatic stress disorder, unspecified: Secondary | ICD-10-CM | POA: Diagnosis not present

## 2024-03-18 MED ORDER — LIDOCAINE HCL (PF) 1 % IJ SOLN
2.0000 mL | Freq: Once | INTRAMUSCULAR | Status: AC
  Administered 2024-03-18: 2 mL
  Filled 2024-03-18: qty 5

## 2024-03-18 MED ORDER — DOXYCYCLINE HYCLATE 100 MG PO CAPS: 100.0000 mg | ORAL_CAPSULE | Freq: Two times a day (BID) | ORAL | 0 refills | Status: DC

## 2024-03-18 MED ORDER — DOXYCYCLINE HYCLATE 100 MG PO TABS
100.0000 mg | ORAL_TABLET | Freq: Once | ORAL | Status: AC
  Administered 2024-03-18: 100 mg via ORAL
  Filled 2024-03-18: qty 1

## 2024-03-18 MED ORDER — CEFTRIAXONE SODIUM 500 MG IJ SOLR
500.0000 mg | Freq: Once | INTRAMUSCULAR | Status: AC
  Administered 2024-03-18: 500 mg via INTRAMUSCULAR
  Filled 2024-03-18: qty 500

## 2024-03-19 LAB — GC/CHLAMYDIA PROBE AMP (~~LOC~~) NOT AT ARMC
Chlamydia: NEGATIVE
Comment: NEGATIVE
Comment: NORMAL
Neisseria Gonorrhea: NEGATIVE

## 2024-03-20 ENCOUNTER — Ambulatory Visit (INDEPENDENT_AMBULATORY_CARE_PROVIDER_SITE_OTHER): Admitting: Student

## 2024-03-20 VITALS — BP 119/84 | HR 84 | Ht 62.0 in | Wt 236.0 lb

## 2024-03-20 DIAGNOSIS — L989 Disorder of the skin and subcutaneous tissue, unspecified: Secondary | ICD-10-CM

## 2024-03-20 DIAGNOSIS — A64 Unspecified sexually transmitted disease: Secondary | ICD-10-CM

## 2024-03-20 DIAGNOSIS — Z113 Encounter for screening for infections with a predominantly sexual mode of transmission: Secondary | ICD-10-CM

## 2024-03-20 DIAGNOSIS — Z114 Encounter for screening for human immunodeficiency virus [HIV]: Secondary | ICD-10-CM

## 2024-03-20 DIAGNOSIS — L739 Follicular disorder, unspecified: Secondary | ICD-10-CM | POA: Diagnosis not present

## 2024-03-20 DIAGNOSIS — R1909 Other intra-abdominal and pelvic swelling, mass and lump: Secondary | ICD-10-CM

## 2024-03-20 NOTE — Addendum Note (Signed)
 Addended by: Penni Bowman T on: 03/20/2024 01:59 PM   Modules accepted: Level of Service

## 2024-03-20 NOTE — Assessment & Plan Note (Signed)
 Tender mass in left groin since April 4th, increasing in size and pain.  Most likely swollen lymph node.  Negative tests for gonorrhea, chlamydia and trichomonas.  Will need to further characterize lesion with ultrasound.  Will rule out HIV and syphilis given her new sexual partner.  - Order HIV and syphilis tests - Order ultrasound of the left groin - Schedule follow-up appointment with PCP for further evaluation.

## 2024-03-20 NOTE — Patient Instructions (Signed)
 It was great to see you! Thank you for allowing me to participate in your care!   Our plans for today:  - Continue to use Aquaphor or vaseline multiple times per day while we wait for biopsy results - Go get the schedule the ultrasound of your groin  - Schedule follow up appointment with your PCP for next week   We are checking some labs today, I will call you if they are abnormal will send you a MyChart message or a letter if they are normal.  If you do not hear about your labs in the next 2 weeks please let us  know.  Take care and seek immediate care sooner if you develop any concerns.   Dr. Glenn Lange, DO [facility]

## 2024-03-20 NOTE — Assessment & Plan Note (Signed)
 Worsening symptoms of dry itchy skin with hyperpigmented areas over eight months, affecting multiple areas. Previous treatments provided temporary relief. Differential includes nostalgia parasitica versus widespread atopic dermatitis, biopsy needed for confirmation. Informed consent obtained for biopsy, risks discussed. - Perform skin biopsy on the back to confirm diagnosis. - Continue using Aquaphor as needed for symptom relief. - Previously referred to [facility] for further management, patient given their contact information today

## 2024-03-20 NOTE — Progress Notes (Signed)
 SUBJECTIVE:   CHIEF COMPLAINT / HPI:   The patient, with a known history of eczema, presents with worsening and spreading of the condition. The itchy and dry rash has now spread to the scalp, face, abdomen, and hands. The skin is described as dry, itchy, and scaly. The patient has been using triamcinolone  and hydrocortisone  cream, which were stopped prior to the current visit. The patient has been applying Aquaphor twice daily since the last visit as advised by Dr. Janae Mclean. The patient reports no significant improvement in symptoms, with persistent dryness and itchiness.  In addition to the eczema, the patient reports a swollen lymph node in the left groin area, which has been present since the beginning of April. The lymph node has been increasing in size and causing significant pain. The patient has a new sexual partner and has requested testing for sexually transmitted infections, including HIV and syphilis. The patient was treated empirically for gonorrhea and chlamydia 3 days ago in the ED, but test results came back negative for gonorrhea, chlamydia, trichomonas, yeast, and BV.  She denies any fever or chills but does admit to increased discharge with odor. She states she was previously taking birth control daily to prevent pregnancy and just stopped taking it 4 days ago, has not had intercourse since.  She had a negative pregnancy test 3 days ago in the ED.  PERTINENT  PMH / PSH: Eczema  OBJECTIVE:   BP 119/84   Pulse 84   Ht 5\' 2"  (1.575 m)   Wt 236 lb (107 kg)   SpO2 100%   BMI 43.16 kg/m    General: NAD, pleasant, well-appearing Cardiac: Well-perfused Respiratory: Normal effort on room air Groin: Dr. Grandville Lax and myself present.  Elongated palpable soft mass along left groin which is tender to palpation and without overlying erythema or edema of the skin Skin: Dry hyperpigmented areas of skin on mid chest, stomach, and mid back.  See photos.  Dry flaky skin of right  hand/fingers and arms.  See photos. Neuro: alert, no obvious focal deficits Psych: Normal affect and mood             ASSESSMENT/PLAN:   Procedure note Procedure: Punch Skin Biopsy Location: Mid back  Performing Physician: Glenn Lange, DO Supervising Physician (if applicable): Dr. Grandville Lax  Informed consent was obtained prior to the procedure. Time out performed. The area surrounding the skin lesion was prepared and cleaned with povidone-iodine swab stick and wiped clean with alcohol prep. The area was sufficiently anesthetized with 1% Lidocaine  without epinephrine.  A size 3 mm disposable biopsy punch tool was used to obtain tissue sample and placed in labeled biopsy cup.  Pressure with gauze  was used to obtain hemostasis. The site was not closed. The patient tolerated the procedure well without complications.  Standard post-procedure care was explained and return precautions reviewed   Groin mass in female Tender mass in left groin since April 4th, increasing in size and pain.  Most likely swollen lymph node.  Negative tests for gonorrhea, chlamydia and trichomonas.  Will need to further characterize lesion with ultrasound.  Will rule out HIV and syphilis given her new sexual partner.  - Order HIV and syphilis tests - Order ultrasound of the left groin - Schedule follow-up appointment with PCP for further evaluation.  Skin lesion Worsening symptoms of dry itchy skin with hyperpigmented areas over eight months, affecting multiple areas. Previous treatments provided temporary relief. Differential includes nostalgia parasitica versus widespread atopic dermatitis,  biopsy needed for confirmation. Informed consent obtained for biopsy, risks discussed. - Perform skin biopsy on the back to confirm diagnosis. - Continue using Aquaphor as needed for symptom relief. - Previously referred to [facility] for further management, patient given their contact information today      Dr. Glenn Lange, DO [facility]

## 2024-03-21 ENCOUNTER — Encounter: Payer: Self-pay | Admitting: Student

## 2024-03-21 LAB — RPR: RPR Ser Ql: NONREACTIVE

## 2024-03-21 LAB — HIV ANTIBODY (ROUTINE TESTING W REFLEX): HIV Screen 4th Generation wRfx: NONREACTIVE

## 2024-03-22 ENCOUNTER — Ambulatory Visit (HOSPITAL_COMMUNITY): Admission: RE | Admit: 2024-03-22 | Source: Ambulatory Visit

## 2024-03-25 ENCOUNTER — Telehealth: Payer: Self-pay

## 2024-03-25 NOTE — Telephone Encounter (Signed)
 Called pt tp reschedule appointment,reached out to get her sched for a pft and her ct and will call her back to schedule follow once tests are scheduled

## 2024-03-26 ENCOUNTER — Ambulatory Visit: Admitting: Emergency Medicine

## 2024-03-26 LAB — DERMATOLOGY PATHOLOGY

## 2024-03-27 ENCOUNTER — Ambulatory Visit (HOSPITAL_COMMUNITY)
Admission: RE | Admit: 2024-03-27 | Discharge: 2024-03-27 | Disposition: A | Discharge status: Home / Self Care | Source: Ambulatory Visit | Attending: Family Medicine | Admitting: Family Medicine

## 2024-03-27 DIAGNOSIS — R102 Pelvic and perineal pain: Secondary | ICD-10-CM | POA: Diagnosis not present

## 2024-03-27 DIAGNOSIS — R1909 Other intra-abdominal and pelvic swelling, mass and lump: Secondary | ICD-10-CM | POA: Diagnosis not present

## 2024-03-27 DIAGNOSIS — F331 Major depressive disorder, recurrent, moderate: Secondary | ICD-10-CM | POA: Diagnosis not present

## 2024-03-27 DIAGNOSIS — F4310 Post-traumatic stress disorder, unspecified: Secondary | ICD-10-CM | POA: Diagnosis not present

## 2024-03-27 NOTE — Progress Notes (Signed)
  SUBJECTIVE:   CHIEF COMPLAINT / HPI:   Left groin mass follow-up: Per chart review at last encounter, noted since April 4 and has been increasing in size and pain.  Seen in [facility] ED on 4/21. STDs ruled out, CBC and CMP normal. Ultrasound performed on 03/27/2024, results not returned yet. She notes pain started in mid-April. She does not shave in the suprapubic but she does wax. Denies any changes in the doxycycline . It is uncomfortable to wear jeans and she feels tender in the area.   Rash: she is using aquaphor currently. In the past she has used hydrocortisone  and triamcinolone .   PERTINENT  PMH / PSH: Anxiety, migraines, PTSD, HLD   OBJECTIVE:  BP 110/72   Pulse 77   Ht 5\' 2"  (1.575 m)   Wt 238 lb (108 kg)   LMP 03/28/2024   SpO2 98%   BMI 43.53 kg/m  General: Well-appearing, NAD Skin: Hyperpigmented chronic folliculitis on upper chest (improved), abdomen (improved), upper back with well-healing punch biopsy site, and forehead; deep, tender, hard cystic structure in the left suprapubic region unable to easily measure without overlying signs of infection  ASSESSMENT/PLAN:   Assessment & Plan Chronic folliculitis Biopsy-proven.  Failed hydrocortisone  and triamcinolone .  Escalate to high potency topical steroids betamethasone 0.05% with continued dry skin measures.  Advised betamethasone only on body surfaces and not on the face.  She may continue hydrocortisone  for face.  Return as needed Groin mass in female Not necessarily groin, more left suprapubic.  May be secondary to waxing in the past.  Feels deep, not along inguinal lymph node chain.  Suspect deep cyst potentially abscess which may need surgical involvement although we will await ultrasound results currently as no immediate concern at this time.  Veronia Goon, DO 03/28/2024, 1:57 PM PGY-3, [facility]

## 2024-03-28 ENCOUNTER — Encounter: Payer: Self-pay | Admitting: Student

## 2024-03-28 ENCOUNTER — Ambulatory Visit: Admitting: Student

## 2024-03-28 VITALS — BP 110/72 | HR 77 | Ht 62.0 in | Wt 238.0 lb

## 2024-03-28 DIAGNOSIS — L739 Follicular disorder, unspecified: Secondary | ICD-10-CM

## 2024-03-28 DIAGNOSIS — R1909 Other intra-abdominal and pelvic swelling, mass and lump: Secondary | ICD-10-CM | POA: Diagnosis not present

## 2024-03-28 MED ORDER — BETAMETHASONE DIPROPIONATE 0.05 % EX CREA
TOPICAL_CREAM | Freq: Two times a day (BID) | CUTANEOUS | 0 refills | Status: DC
Start: 2024-03-28 — End: 2024-04-07

## 2024-03-28 NOTE — Assessment & Plan Note (Signed)
 Not necessarily groin, more left suprapubic.  May be secondary to waxing in the past.  Feels deep, not along inguinal lymph node chain.  Suspect deep cyst potentially abscess which may need surgical involvement although we will await ultrasound results currently as no immediate concern at this time.

## 2024-03-28 NOTE — Patient Instructions (Addendum)
 It was great to see you today! Thank you for choosing [facility] for your primary care.  Today we addressed: Chronic folliculitis: Betamethasone twice a day x 10 days.  Continue with Aquaphor and moisturizing measures. Left groin/suprapubic mass: Lets wait on the ultrasound.  We may need general surgery if this is a deep cyst.  If you haven't already, sign up for My Chart to have easy access to your labs results, and communication with your primary care physician.  Return if symptoms worsen or fail to improve. Please arrive 15 minutes before your appointment to ensure smooth check in process.  We appreciate your efforts in making this happen.  Thank you for allowing me to participate in your care, Veronia Goon, DO 03/28/2024, 1:48 PM PGY-3, [facility]

## 2024-03-28 NOTE — Assessment & Plan Note (Signed)
 Biopsy-proven.  Failed hydrocortisone  and triamcinolone .  Escalate to high potency topical steroids betamethasone 0.05% with continued dry skin measures.  Advised betamethasone only on body surfaces and not on the face.  She may continue hydrocortisone  for face.  Return as needed

## 2024-03-31 ENCOUNTER — Telehealth: Payer: Self-pay

## 2024-03-31 ENCOUNTER — Other Ambulatory Visit (HOSPITAL_COMMUNITY): Payer: Self-pay

## 2024-03-31 NOTE — Telephone Encounter (Signed)
 Pharmacy Patient Advocate Encounter   Received notification from Patient Pharmacy that prior authorization for BETAMETHASONE DIP 0.05% CREAM is required/requested.   Insurance verification completed.   The patient is insured through [facility] .   THE FOLLOWING is preferred by the insurance.  If suggested medication is appropriate, Please send in a new RX and discontinue this one. If not, please advise as to why it's not appropriate so that we may request a Prior Authorization. Please note, some preferred medications may still require a PA.  If the suggested medications have not been trialed and there are no contraindications to their use, the PA will not be submitted, as it will not be approved.

## 2024-04-02 ENCOUNTER — Other Ambulatory Visit: Payer: Self-pay | Admitting: Student

## 2024-04-02 ENCOUNTER — Encounter: Payer: Self-pay | Admitting: Student

## 2024-04-02 DIAGNOSIS — L739 Follicular disorder, unspecified: Secondary | ICD-10-CM

## 2024-04-02 MED ORDER — BETAMETHASONE VALERATE 0.1 % EX OINT
1.0000 | TOPICAL_OINTMENT | Freq: Two times a day (BID) | CUTANEOUS | 0 refills | Status: DC
Start: 2024-04-02 — End: 2024-04-15

## 2024-04-04 ENCOUNTER — Telehealth: Payer: Self-pay | Admitting: Student

## 2024-04-04 DIAGNOSIS — R1909 Other intra-abdominal and pelvic swelling, mass and lump: Secondary | ICD-10-CM

## 2024-04-04 NOTE — Telephone Encounter (Signed)
 Called pt to discuss the recent US  results concerning her L tender inguinal mass which is concerning for a heterogeneous 3.7 cm lesion which may represent hematoma, infection, granuloma and adjacent echogenic rounded area which was read as possibly free silicone within the lymph nodes. I also wonder if subcutaneous endometriosis could be on the differential.  Prior surgeries and procedures in this area include c-sections and hysteroscopy.  Pt agrees with further work up with CT with contrast of abdomen and pelvis which has been ordered.   Last intercourse April 1st LMP July 2024 Neg preg test 4/21 and has not been sexually active since 02/26/24.  Pt advised to take home pregnancy test prior to CT scan to ensure she is not pregnant which she states she will do.

## 2024-04-08 DIAGNOSIS — F4323 Adjustment disorder with mixed anxiety and depressed mood: Secondary | ICD-10-CM | POA: Diagnosis not present

## 2024-04-11 ENCOUNTER — Ambulatory Visit (HOSPITAL_COMMUNITY)
Admission: RE | Admit: 2024-04-11 | Discharge: 2024-04-11 | Disposition: A | Discharge status: Home / Self Care | Source: Ambulatory Visit | Attending: Family Medicine | Admitting: Family Medicine

## 2024-04-11 DIAGNOSIS — R1909 Other intra-abdominal and pelvic swelling, mass and lump: Secondary | ICD-10-CM | POA: Insufficient documentation

## 2024-04-11 MED ORDER — SODIUM CHLORIDE (PF) 0.9 % IJ SOLN
INTRAMUSCULAR | Status: AC
  Filled 2024-04-11: qty 50

## 2024-04-11 MED ORDER — IOHEXOL 300 MG/ML  SOLN
100.0000 mL | Freq: Once | INTRAMUSCULAR | Status: AC | PRN
Start: 2024-04-11 — End: 2024-04-11
  Administered 2024-04-11: 100 mL via INTRAVENOUS

## 2024-04-12 ENCOUNTER — Ambulatory Visit: Payer: Self-pay | Admitting: Student

## 2024-04-14 NOTE — Progress Notes (Signed)
  SUBJECTIVE:   CHIEF COMPLAINT / HPI:   Patient presents today regarding enlarged left inguinal lymph node and would like to discuss next steps and CT results.  She reports the lymph node is still tender every day although does not feel that it has enlarged.  She also notes that she did not receive the betamethasone  ointment for her folliculitis.  PERTINENT  PMH / PSH: Anxiety, migraines, PTSD, HLD   OBJECTIVE:  BP 120/84   Pulse 71   Ht 5\' 2"  (1.575 m)   Wt 238 lb 3.2 oz (108 kg)   LMP 03/28/2024   SpO2 100%   BMI 43.57 kg/m  General: Well-appearing, NAD  ASSESSMENT/PLAN:   Assessment & Plan Groin mass in female Reviewed ultrasound and CT results and differential diagnosis, recommend neoplastic workup with CBC with differential, ESR, LDH.  Will forego follow-up ultrasound and attempt FNA with IR. Chronic folliculitis Patient did not receive ointment at pharmacy, sent another Rx of betamethasone  valerate ointment. Return if symptoms worsen or fail to improve. Veronia Goon, DO 04/15/2024, 2:11 PM PGY-3, [facility]

## 2024-04-15 ENCOUNTER — Ambulatory Visit: Admitting: Student

## 2024-04-15 ENCOUNTER — Encounter: Payer: Self-pay | Admitting: Student

## 2024-04-15 VITALS — BP 120/84 | HR 71 | Ht 62.0 in | Wt 238.2 lb

## 2024-04-15 DIAGNOSIS — R1909 Other intra-abdominal and pelvic swelling, mass and lump: Secondary | ICD-10-CM

## 2024-04-15 DIAGNOSIS — L739 Follicular disorder, unspecified: Secondary | ICD-10-CM | POA: Diagnosis not present

## 2024-04-15 LAB — POCT SEDIMENTATION RATE: POCT SED RATE: 30 mm/h — AB (ref 0–22)

## 2024-04-15 MED ORDER — BETAMETHASONE VALERATE 0.1 % EX OINT: 1.0000 | TOPICAL_OINTMENT | Freq: Two times a day (BID) | CUTANEOUS | 0 refills | Status: AC

## 2024-04-15 NOTE — Patient Instructions (Addendum)
 It was great to see you today! Thank you for choosing [facility] for your primary care.  Today we addressed: Sent started ointment for chronic folliculitis Lab work for enlarged lymph node and FNA biopsy.  If you haven't already, sign up for My Chart to have easy access to your labs results, and communication with your primary care physician. We are checking some labs today. If they are abnormal, I will call you. If they are normal, I will send you a MyChart message (if it is active) or a letter in the mail. If you do not hear about your labs in the next 2 weeks, please call the office. Return if symptoms worsen or fail to improve. Please arrive 15 minutes before your appointment to ensure smooth check in process.  We appreciate your efforts in making this happen.  Thank you for allowing me to participate in your care, Veronia Goon, DO 04/15/2024, 2:08 PM PGY-3, [facility]

## 2024-04-15 NOTE — Assessment & Plan Note (Addendum)
 Reviewed ultrasound and CT results and differential diagnosis, recommend neoplastic workup with CBC with differential, ESR, LDH.  Will forego follow-up ultrasound and attempt FNA with IR.

## 2024-04-15 NOTE — Addendum Note (Signed)
 Addended by: Vicki Chaffin L on: 04/15/2024 03:39 PM   Modules accepted: Orders

## 2024-04-16 LAB — CBC WITH DIFFERENTIAL/PLATELET
Basophils Absolute: 0 10*3/uL (ref 0.0–0.2)
Basos: 0 %
EOS (ABSOLUTE): 0.2 10*3/uL (ref 0.0–0.4)
Eos: 4 %
Hematocrit: 38.7 % (ref 34.0–46.6)
Hemoglobin: 12.4 g/dL (ref 11.1–15.9)
Immature Grans (Abs): 0 10*3/uL (ref 0.0–0.1)
Immature Granulocytes: 0 %
Lymphocytes Absolute: 1.5 10*3/uL (ref 0.7–3.1)
Lymphs: 28 %
MCH: 26.6 pg (ref 26.6–33.0)
MCHC: 32 g/dL (ref 31.5–35.7)
MCV: 83 fL (ref 79–97)
Monocytes Absolute: 0.5 10*3/uL (ref 0.1–0.9)
Monocytes: 10 %
Neutrophils Absolute: 3 10*3/uL (ref 1.4–7.0)
Neutrophils: 58 %
Platelets: 301 10*3/uL (ref 150–450)
RBC: 4.66 x10E6/uL (ref 3.77–5.28)
RDW: 13 % (ref 11.7–15.4)
WBC: 5.2 10*3/uL (ref 3.4–10.8)

## 2024-04-16 LAB — LACTATE DEHYDROGENASE: LDH: 403 IU/L — ABNORMAL HIGH (ref 119–226)

## 2024-04-18 ENCOUNTER — Ambulatory Visit: Payer: Self-pay | Admitting: Student

## 2024-04-18 DIAGNOSIS — F4323 Adjustment disorder with mixed anxiety and depressed mood: Secondary | ICD-10-CM | POA: Diagnosis not present

## 2024-04-22 ENCOUNTER — Other Ambulatory Visit: Payer: Self-pay | Admitting: Internal Medicine

## 2024-04-22 DIAGNOSIS — Z1231 Encounter for screening mammogram for malignant neoplasm of breast: Secondary | ICD-10-CM

## 2024-04-23 DIAGNOSIS — Z7689 Persons encountering health services in other specified circumstances: Secondary | ICD-10-CM | POA: Diagnosis not present

## 2024-04-23 DIAGNOSIS — R079 Chest pain, unspecified: Secondary | ICD-10-CM | POA: Diagnosis not present

## 2024-04-23 DIAGNOSIS — R1909 Other intra-abdominal and pelvic swelling, mass and lump: Secondary | ICD-10-CM | POA: Diagnosis not present

## 2024-04-23 DIAGNOSIS — E041 Nontoxic single thyroid nodule: Secondary | ICD-10-CM | POA: Diagnosis not present

## 2024-04-23 DIAGNOSIS — R918 Other nonspecific abnormal finding of lung field: Secondary | ICD-10-CM | POA: Diagnosis not present

## 2024-04-27 DIAGNOSIS — R079 Chest pain, unspecified: Secondary | ICD-10-CM | POA: Diagnosis not present

## 2024-04-28 ENCOUNTER — Other Ambulatory Visit (HOSPITAL_COMMUNITY): Payer: Self-pay

## 2024-05-01 DIAGNOSIS — F331 Major depressive disorder, recurrent, moderate: Secondary | ICD-10-CM | POA: Diagnosis not present

## 2024-05-01 DIAGNOSIS — F4310 Post-traumatic stress disorder, unspecified: Secondary | ICD-10-CM | POA: Diagnosis not present

## 2024-05-01 DIAGNOSIS — E785 Hyperlipidemia, unspecified: Secondary | ICD-10-CM | POA: Diagnosis not present

## 2024-05-01 DIAGNOSIS — R1909 Other intra-abdominal and pelvic swelling, mass and lump: Secondary | ICD-10-CM | POA: Diagnosis not present

## 2024-05-06 ENCOUNTER — Encounter: Payer: Self-pay | Admitting: *Deleted

## 2024-05-07 ENCOUNTER — Ambulatory Visit (HOSPITAL_COMMUNITY)
Admission: RE | Admit: 2024-05-07 | Discharge: 2024-05-07 | Disposition: A | Discharge status: Home / Self Care | Source: Ambulatory Visit | Attending: Emergency Medicine | Admitting: Emergency Medicine

## 2024-05-07 DIAGNOSIS — E041 Nontoxic single thyroid nodule: Secondary | ICD-10-CM | POA: Diagnosis not present

## 2024-05-07 DIAGNOSIS — R918 Other nonspecific abnormal finding of lung field: Secondary | ICD-10-CM | POA: Diagnosis not present

## 2024-05-08 DIAGNOSIS — F331 Major depressive disorder, recurrent, moderate: Secondary | ICD-10-CM | POA: Diagnosis not present

## 2024-05-08 DIAGNOSIS — F4310 Post-traumatic stress disorder, unspecified: Secondary | ICD-10-CM | POA: Diagnosis not present

## 2024-05-12 DIAGNOSIS — F331 Major depressive disorder, recurrent, moderate: Secondary | ICD-10-CM | POA: Diagnosis not present

## 2024-05-12 DIAGNOSIS — F4310 Post-traumatic stress disorder, unspecified: Secondary | ICD-10-CM | POA: Diagnosis not present

## 2024-05-14 DIAGNOSIS — R591 Generalized enlarged lymph nodes: Secondary | ICD-10-CM | POA: Diagnosis not present

## 2024-05-14 DIAGNOSIS — R590 Localized enlarged lymph nodes: Secondary | ICD-10-CM | POA: Diagnosis not present

## 2024-05-14 DIAGNOSIS — I881 Chronic lymphadenitis, except mesenteric: Secondary | ICD-10-CM | POA: Diagnosis not present

## 2024-05-19 DIAGNOSIS — Z0000 Encounter for general adult medical examination without abnormal findings: Secondary | ICD-10-CM | POA: Diagnosis not present

## 2024-05-20 DIAGNOSIS — F331 Major depressive disorder, recurrent, moderate: Secondary | ICD-10-CM | POA: Diagnosis not present

## 2024-05-20 DIAGNOSIS — F4310 Post-traumatic stress disorder, unspecified: Secondary | ICD-10-CM | POA: Diagnosis not present

## 2024-05-22 ENCOUNTER — Ambulatory Visit: Admitting: Podiatry

## 2024-05-22 ENCOUNTER — Encounter: Payer: Self-pay | Admitting: Podiatry

## 2024-05-22 DIAGNOSIS — M722 Plantar fascial fibromatosis: Secondary | ICD-10-CM

## 2024-05-22 DIAGNOSIS — R079 Chest pain, unspecified: Secondary | ICD-10-CM | POA: Diagnosis not present

## 2024-05-22 MED ORDER — MELOXICAM 15 MG PO TABS: 15.0000 mg | ORAL_TABLET | Freq: Every day | ORAL | 2 refills | Status: AC

## 2024-05-23 NOTE — Progress Notes (Signed)
 Subjective:   Patient ID: Connie Jenkins, female   DOB: 43 y.o.   MRN: [id number]   HPI Patient states she was doing well but has developed pain in the heel region of both feet again   ROS      Objective:  Physical Exam  Neurovascular status intact with exquisite discomfort in the medial band of the fascia bilateral     Assessment:  Acute plantar fasciitis bilateral     Plan:  Sterile prep injected the plantar fascia at insertion bilateral 3 mg Kenalog  5 mg Xylocaine  applied sterile dressing tolerated well reappoint as symptoms indicate

## 2024-05-26 DIAGNOSIS — R079 Chest pain, unspecified: Secondary | ICD-10-CM | POA: Diagnosis not present

## 2024-05-29 DIAGNOSIS — F4323 Adjustment disorder with mixed anxiety and depressed mood: Secondary | ICD-10-CM | POA: Diagnosis not present

## 2024-06-03 ENCOUNTER — Ambulatory Visit
Admission: RE | Admit: 2024-06-03 | Discharge: 2024-06-03 | Disposition: A | Discharge status: Home / Self Care | Source: Ambulatory Visit | Attending: Internal Medicine | Admitting: Internal Medicine

## 2024-06-03 DIAGNOSIS — Z1231 Encounter for screening mammogram for malignant neoplasm of breast: Secondary | ICD-10-CM

## 2024-06-09 DIAGNOSIS — F331 Major depressive disorder, recurrent, moderate: Secondary | ICD-10-CM | POA: Diagnosis not present

## 2024-06-09 DIAGNOSIS — F4310 Post-traumatic stress disorder, unspecified: Secondary | ICD-10-CM | POA: Diagnosis not present

## 2024-06-17 DIAGNOSIS — I517 Cardiomegaly: Secondary | ICD-10-CM | POA: Diagnosis not present

## 2024-06-18 DIAGNOSIS — G43501 Persistent migraine aura without cerebral infarction, not intractable, with status migrainosus: Secondary | ICD-10-CM | POA: Diagnosis not present

## 2024-06-18 DIAGNOSIS — G43009 Migraine without aura, not intractable, without status migrainosus: Secondary | ICD-10-CM | POA: Diagnosis not present

## 2024-06-18 DIAGNOSIS — R519 Headache, unspecified: Secondary | ICD-10-CM | POA: Diagnosis not present

## 2024-06-18 NOTE — ED Provider Notes (Signed)
 Patient placed in First Look pathway, seen and evaluated for chief complaint of migraine that is into improving after UC medication or cocktail. Was sent for workup. Pertinent exam findings include non-toxic in appearance. Based on initial evaluation, labs are currently indicated and radiology studies are currently indicated as allowed for current processes and treatments as applicable in a triage setting and could be different than if patient were seen in a main treatment area or dependent on labs/imagining after results are displayed.  Patient counseled on process, plan, and necessity for staying for completing the evaluation.   This document serves as a record of services personally performed by Tamea Ford PA-C.    [facility] Emergency Department Emergency Department Provider Note  This document was created using the aid of voice recognition Dragon dictation software.   Provider at bedside: 06/19/2024 9:23 PM  History obtained from the: Patient  History   Chief Complaint  Patient presents with  . Headache    HPI  Alexsandra Shontz is a 43 y.o. female who presents to the ED with complaints of migraine headache for the last 2 days.  Symptoms started yesterday and not maximal at onset.  Gradually worsening between yesterday and today.  But patient was able to take a nap earlier today however woke up with persistent headache.  Was seen by urgent care and given a migraine cocktail without improvement in symptoms.  Denies any other treatments or remedies.  Notes history of migraines and symptoms feel similar however usually resolved with migraine cocktail and did not on this occasion.  Denies following with neurology.  States has been many years since a headache.  Does note photophobia and intermittent nausea.  Still tolerating p.o.  Denies chest pain, shortness of breath, abdominal pain, fever chills lightheadedness and dizziness.  Does note pain radiates globally  throughout the head and occasionally down to her neck.  Denies stiffness of the neck or decreased range of motion. ______________________ ROS: Pertinent positives and negatives per HPI. Pertinent past medical, surgical, social and family history records were reviewed. Current Medications and Allergies were reviewed.  Physical Exam   Vitals:   06/18/24 1830 06/19/24 0025  BP: 96/63 110/69  BP Location: Left arm Right arm  Patient Position: Sitting Lying  Pulse: 73 83  Resp: 18 18  Temp: 98.1 F (36.7 C) 97.9 F (36.6 C)  TempSrc:  Oral  SpO2: 100% 100%  Weight: 111 kg (245 lb)   Height: 157.5 cm (5' 2)     Physical Exam Vitals and nursing note reviewed.  Constitutional:      General: She is not in acute distress.    Appearance: She is well-developed. She is ill-appearing. She is not toxic-appearing or diaphoretic.  HENT:     Head: Normocephalic and atraumatic.   Eyes:     Extraocular Movements: Extraocular movements intact.     Pupils: Pupils are equal, round, and reactive to light.    Cardiovascular:     Rate and Rhythm: Normal rate.  Pulmonary:     Effort: Pulmonary effort is normal. No respiratory distress.     Breath sounds: No stridor. No wheezing.  Abdominal:     General: Abdomen is flat. Bowel sounds are normal. There is no distension or abdominal bruit. There are no signs of injury.     Palpations: Abdomen is soft. There is no shifting dullness, fluid wave, hepatomegaly, splenomegaly or mass.     Tenderness: There is no abdominal tenderness. There is  no guarding or rebound.   Musculoskeletal:     Cervical back: Normal range of motion. No rigidity.   Skin:    General: Skin is warm and dry.     Capillary Refill: Capillary refill takes less than 2 seconds.   Neurological:     General: No focal deficit present.     Mental Status: She is alert.     GCS: GCS eye subscore is 4. GCS verbal subscore is 5. GCS motor subscore is 6.     Cranial Nerves: No cranial  nerve deficit, dysarthria or facial asymmetry.     Sensory: No sensory deficit.     Motor: No weakness.     Coordination: Coordination normal.     Gait: Gait normal.     Results    ED Course     Medical Decision Making  Pt is a 43 y.o. female who presented with persistent headache for 2 days.   DDX: Migraine, brain bleed, tension headache, cluster headache  Clinical Complexity  Patient's presentation is most consistent with acute presentation with potential threat to life or bodily function.  Patient's Impaired access to primary care increases the complexity of managing their  presentation with headache.    Provider time spent in patient care today, inclusive of but not limited to clinical reassessment, review of diagnostic studies, and discharge preparation, was greater than 30 minutes.   Afebrile nontoxic-appearing 43 year old female coming in for persistent headache.  Vital stable arrival.  Clinically well-appearing.  CT of the head negative for any brain bleed.  Labs within normal limits for the patient.  Symptoms are consistent with baseline migraine and no red flag symptoms at this time. Patient was given migraine cocktail with mild improvement in symptoms.  Was given an additional dose of Toradol  and Tylenol  with improvement in symptoms.  Patient on reassessment was sleeping and resting comfortably.  Does note minimal headache still After waking up however is tolerable amount.  Patient back to tolerating p.o. and was able to eat while here in the ER.  Patient will increase fluids over the next several days and continue taking ibuprofen  and Tylenol  as needed for pain.  I did give return precautions for any new or worsening symptoms.  On final reassessment, patient does note moderate improvement in symptoms and overall feels well and could not go home.  Return precautions given but otherwise outpatient follow-up.  All Labs were nterpreted/reviewed no leukocytosis or severe  electrolyte derangement. Imaging interpreted by me include CT of the head negative for brain bleed  Patient reassessed throughout ER stay and remained stable.  Results were discussed with the patient.  We also discussed plan for treatment going forward and importance of close follow-up with her outpatient provider.  I did give strict return precautions for any new symptoms or any worsening symptoms.  Patient verbalized understanding and agreement to plan, treatment, and close follow-up with return precautions for any new or worsening symptoms.    Medical Decision Making Problems Addressed: Migraine without aura and without status migrainosus, not intractable: complicated acute illness or injury  Amount and/or Complexity of Data Reviewed Labs: ordered. Radiology: ordered.  Risk OTC drugs. Prescription drug management.    ED Clinical Impression   1. Migraine without aura and without status migrainosus, not intractable    FOLLOW UP Arnaz Siganporia, FNP [address] [place] [address] [phone]  Schedule an appointment as soon as possible for a visit    [facility] -  EMERGENCY DEPARTMENT [address] [place]  [phone]  As needed, If symptoms worsen   ED Disposition     ED Disposition  Discharge   Condition  Stable   Comment  --        _____________________________

## 2024-06-20 DIAGNOSIS — F331 Major depressive disorder, recurrent, moderate: Secondary | ICD-10-CM | POA: Diagnosis not present

## 2024-06-20 DIAGNOSIS — F4310 Post-traumatic stress disorder, unspecified: Secondary | ICD-10-CM | POA: Diagnosis not present

## 2024-06-24 DIAGNOSIS — R1909 Other intra-abdominal and pelvic swelling, mass and lump: Secondary | ICD-10-CM | POA: Diagnosis not present

## 2024-06-24 DIAGNOSIS — E66813 Obesity, class 3: Secondary | ICD-10-CM | POA: Diagnosis not present

## 2024-06-24 DIAGNOSIS — Z6841 Body Mass Index (BMI) 40.0 and over, adult: Secondary | ICD-10-CM | POA: Diagnosis not present

## 2024-06-24 DIAGNOSIS — G43109 Migraine with aura, not intractable, without status migrainosus: Secondary | ICD-10-CM | POA: Diagnosis not present

## 2024-06-25 DIAGNOSIS — F331 Major depressive disorder, recurrent, moderate: Secondary | ICD-10-CM | POA: Diagnosis not present

## 2024-06-25 DIAGNOSIS — F4310 Post-traumatic stress disorder, unspecified: Secondary | ICD-10-CM | POA: Diagnosis not present

## 2024-06-27 DIAGNOSIS — D72828 Other elevated white blood cell count: Secondary | ICD-10-CM | POA: Diagnosis not present

## 2024-06-27 DIAGNOSIS — R1909 Other intra-abdominal and pelvic swelling, mass and lump: Secondary | ICD-10-CM | POA: Diagnosis not present

## 2024-06-27 DIAGNOSIS — R591 Generalized enlarged lymph nodes: Secondary | ICD-10-CM | POA: Diagnosis not present

## 2024-07-03 DIAGNOSIS — F4323 Adjustment disorder with mixed anxiety and depressed mood: Secondary | ICD-10-CM | POA: Diagnosis not present

## 2024-07-07 DIAGNOSIS — I209 Angina pectoris, unspecified: Secondary | ICD-10-CM | POA: Diagnosis not present

## 2024-07-07 DIAGNOSIS — R079 Chest pain, unspecified: Secondary | ICD-10-CM | POA: Diagnosis not present

## 2024-07-07 DIAGNOSIS — R7303 Prediabetes: Secondary | ICD-10-CM | POA: Diagnosis not present

## 2024-07-09 DIAGNOSIS — F4310 Post-traumatic stress disorder, unspecified: Secondary | ICD-10-CM | POA: Diagnosis not present

## 2024-07-09 DIAGNOSIS — F331 Major depressive disorder, recurrent, moderate: Secondary | ICD-10-CM | POA: Diagnosis not present

## 2024-07-10 DIAGNOSIS — D72828 Other elevated white blood cell count: Secondary | ICD-10-CM | POA: Diagnosis not present

## 2024-07-10 DIAGNOSIS — R591 Generalized enlarged lymph nodes: Secondary | ICD-10-CM | POA: Diagnosis not present

## 2024-07-16 DIAGNOSIS — F4310 Post-traumatic stress disorder, unspecified: Secondary | ICD-10-CM | POA: Diagnosis not present

## 2024-07-16 DIAGNOSIS — F331 Major depressive disorder, recurrent, moderate: Secondary | ICD-10-CM | POA: Diagnosis not present

## 2024-07-23 DIAGNOSIS — L308 Other specified dermatitis: Secondary | ICD-10-CM | POA: Diagnosis not present

## 2024-07-25 DIAGNOSIS — Z6841 Body Mass Index (BMI) 40.0 and over, adult: Secondary | ICD-10-CM | POA: Diagnosis not present

## 2024-07-25 DIAGNOSIS — R591 Generalized enlarged lymph nodes: Secondary | ICD-10-CM | POA: Diagnosis not present

## 2024-07-25 DIAGNOSIS — R7303 Prediabetes: Secondary | ICD-10-CM | POA: Diagnosis not present

## 2024-07-25 DIAGNOSIS — E66813 Obesity, class 3: Secondary | ICD-10-CM | POA: Diagnosis not present

## 2024-07-25 DIAGNOSIS — F4323 Adjustment disorder with mixed anxiety and depressed mood: Secondary | ICD-10-CM | POA: Diagnosis not present

## 2024-07-29 DIAGNOSIS — R591 Generalized enlarged lymph nodes: Secondary | ICD-10-CM | POA: Diagnosis not present

## 2024-07-31 DIAGNOSIS — L309 Dermatitis, unspecified: Secondary | ICD-10-CM | POA: Diagnosis not present

## 2024-07-31 NOTE — Progress Notes (Signed)
 Telephone Information:  Home Phone Not on file.  Work [employer] Not on file.  Mobile [phone]   Subjective:  Connie Jenkins is a 43 y.o. yo female here for No chief complaint on file.   Primary reason you are here today? Eczema Areas involved - back, chest, hands, scalp  How long - follow up Symptoms - itching Treatment tried - OTC lotions - has had clobetasol 0.05% ointment, hydrocortisone  2.5% cream and TAC ointment in past  Current treatment - Clobetasol 0.05% ointment,  ROS: pt denies any lumps, rashes or other skin symptoms except as detailed above.  No constitutional complaints today   Reviewed and updated this visit by provider: None       Objective:  WDWN female in NAD; A & O x 3 with normal affect  Pt is much improved with less scaling and erythema on back, chest, face  Exam Lexicon - AK = red, scaly macule ; SK = stuck on waxy hyperkeratotic papule ; C = cherry red macule/papule; NEVI = pigmented macule/papule ; BCC = pearly papule/plaque/nodule ; SCC= keratotic indurated papule/nodule; Ecz = scaling erythematous patches/plaques; MC= flesh colored umbilicated papule; VV = rough, warty papule/plaque; NERD = no evidence of recurrent disease at site of skin cancer removal ; DF= firm, brown papule with positive retraction sign; ISK = stuck on waxy papule with erythema and/or crusting; EIC = cystic nodule/papule with punctum   Assessment and Plan:  1.Eczema-improving  No diagnosis found.  -continue clobetasol oint bid x 1 more week - Protopic 0.03% ointment twice a day on face for another week Continue clobetasol shampoo once a week as needed Moisture lotion and cream stressed daily after showering or bathing patient voiced understanding Return to clinic as needed   *Emphasized importance of good sun protection practice including wearing wide brimmed hat, sun glasses, sun protective clothing and frequent application of at least 30 SPF broad spectrum sunscreen.   Sun protection information was also included in AVS for patient to review at home  *Patient to call for appointment for any new, nonhealing, or changing lesion  Attestation:  I have reviewed the information contained in this note and personally verified its accuracy. I obtained the history of present illness and personally performed the physical exam.

## 2024-08-01 DIAGNOSIS — F4310 Post-traumatic stress disorder, unspecified: Secondary | ICD-10-CM | POA: Diagnosis not present

## 2024-08-01 DIAGNOSIS — F331 Major depressive disorder, recurrent, moderate: Secondary | ICD-10-CM | POA: Diagnosis not present

## 2024-08-02 ENCOUNTER — Other Ambulatory Visit: Payer: Self-pay

## 2024-08-02 ENCOUNTER — Emergency Department (HOSPITAL_BASED_OUTPATIENT_CLINIC_OR_DEPARTMENT_OTHER)

## 2024-08-02 ENCOUNTER — Encounter (HOSPITAL_BASED_OUTPATIENT_CLINIC_OR_DEPARTMENT_OTHER): Payer: Self-pay | Admitting: Emergency Medicine

## 2024-08-02 ENCOUNTER — Emergency Department (HOSPITAL_BASED_OUTPATIENT_CLINIC_OR_DEPARTMENT_OTHER)
Admission: EM | Admit: 2024-08-02 | Discharge: 2024-08-02 | Disposition: A | Discharge status: Home / Self Care | Attending: Emergency Medicine | Admitting: Emergency Medicine

## 2024-08-02 ENCOUNTER — Encounter: Payer: Self-pay | Admitting: Emergency Medicine

## 2024-08-02 DIAGNOSIS — R079 Chest pain, unspecified: Secondary | ICD-10-CM | POA: Diagnosis not present

## 2024-08-02 DIAGNOSIS — R0602 Shortness of breath: Secondary | ICD-10-CM | POA: Diagnosis not present

## 2024-08-02 DIAGNOSIS — I509 Heart failure, unspecified: Secondary | ICD-10-CM | POA: Diagnosis not present

## 2024-08-02 DIAGNOSIS — I110 Hypertensive heart disease with heart failure: Secondary | ICD-10-CM | POA: Diagnosis not present

## 2024-08-02 DIAGNOSIS — Z91040 Latex allergy status: Secondary | ICD-10-CM | POA: Insufficient documentation

## 2024-08-02 DIAGNOSIS — R0789 Other chest pain: Secondary | ICD-10-CM | POA: Diagnosis not present

## 2024-08-02 DIAGNOSIS — J9811 Atelectasis: Secondary | ICD-10-CM | POA: Diagnosis not present

## 2024-08-02 LAB — CBC
HCT: 38.6 % (ref 36.0–46.0)
Hemoglobin: 12.2 g/dL (ref 12.0–15.0)
MCH: 26.8 pg (ref 26.0–34.0)
MCHC: 31.6 g/dL (ref 30.0–36.0)
MCV: 84.8 fL (ref 80.0–100.0)
Platelets: 279 K/uL (ref 150–400)
RBC: 4.55 MIL/uL (ref 3.87–5.11)
RDW: 13.8 % (ref 11.5–15.5)
WBC: 7.2 K/uL (ref 4.0–10.5)
nRBC: 0 % (ref 0.0–0.2)

## 2024-08-02 LAB — BASIC METABOLIC PANEL WITH GFR
Anion gap: 11 (ref 5–15)
BUN: 17 mg/dL (ref 6–20)
CO2: 24 mmol/L (ref 22–32)
Calcium: 9.4 mg/dL (ref 8.9–10.3)
Chloride: 105 mmol/L (ref 98–111)
Creatinine, Ser: 0.89 mg/dL (ref 0.44–1.00)
GFR, Estimated: >60 mL/min (ref >60)
Glucose, Bld: 105 mg/dL — ABNORMAL HIGH (ref 70–99)
Potassium: 3.7 mmol/L (ref 3.5–5.1)
Sodium: 139 mmol/L (ref 135–145)

## 2024-08-02 LAB — PREGNANCY, URINE: Preg Test, Ur: NEGATIVE

## 2024-08-02 LAB — TROPONIN T, HIGH SENSITIVITY
Troponin T High Sensitivity: <15 ng/L (ref 0–19)
Troponin T High Sensitivity: <15 ng/L (ref 0–19)

## 2024-08-02 LAB — D-DIMER, QUANTITATIVE: D-Dimer, Quant: 1.39 ug{FEU}/mL — ABNORMAL HIGH (ref 0.00–0.50)

## 2024-08-02 MED ORDER — IOHEXOL 350 MG/ML SOLN
75.0000 mL | Freq: Once | INTRAVENOUS | Status: AC | PRN
  Administered 2024-08-02: 75 mL via INTRAVENOUS

## 2024-08-02 MED ORDER — KETOROLAC TROMETHAMINE 30 MG/ML IJ SOLN
15.0000 mg | Freq: Once | INTRAMUSCULAR | Status: AC
  Administered 2024-08-02: 15 mg via INTRAVENOUS
  Filled 2024-08-02: qty 1

## 2024-08-02 NOTE — ED Triage Notes (Signed)
  Patient comes in with chest pain and SOB that woke her up from sleeping around 0130.  Patient states pain is in L chest and radiates to back.  Hx CHF.  Denies any N/V, dizziness, or diaphoresis.  Pain 8/10, squeezing.

## 2024-08-02 NOTE — ED Notes (Signed)
 86-94 Pulse while ambulating. Pt complained of pain while breathing in and out.  No dizziness or SOB.

## 2024-08-02 NOTE — ED Provider Notes (Signed)
 [place] EMERGENCY DEPARTMENT AT [facility] Provider Note   CSN: [id number] Arrival date & time: 08/02/24  0222     Patient presents with: Chest Pain   Connie Jenkins is a 43 y.o. female.   Patient with a history of prediabetes, hypertension, hyperlipidemia, CHF not on any diuretics here with left-sided chest pain, back pain and shortness of breath that woke her from sleep.  States she felt well going to bed woke up about 1 AM with constant pain in the center of her chest, left breast and upper back area.  Pain is constant worse with deep breathing and palpation and movement.  Associated with shortness of breath.  No cough or fever.  No abdominal pain, nausea or vomiting.  No leg pain or leg swelling.  She is never had this kind of pain in the past.  Denies history of MI.  She had reassuring cardiac catheterization in August 2025.  Reports she had CHF and used to be on Imdur but is no longer.  Reports the pumping function of her heart was slightly reduced but she is not taking any diuretics. She has never had this kind of chest pain before.  She did not take anything for it at home.  The history is provided by the patient.  Chest Pain Associated symptoms: shortness of breath   Associated symptoms: no abdominal pain, no dizziness, no fever, no headache, no nausea, no vomiting and no weakness        Prior to Admission medications   Medication Sig Start Date End Date Taking? Authorizing Provider  betamethasone  valerate ointment (VALISONE ) 0.1 % Apply 1 Application topically 2 (two) times daily. Avoid use on face/neck, hands, feet. 04/15/24   Orlando Pond, DO  cetirizine  (ZYRTEC ) 10 MG tablet Take 1 tablet (10 mg total) by mouth daily. 02/20/24   Orlando Pond, DO  hydrocortisone  2.5 % ointment Apply topically 2 (two) times daily. As needed for mild eczema.  Do not use for more than 1-2 weeks at a time. 12/25/23   Orlando Pond, DO  meloxicam  (MOBIC ) 15 MG tablet Take 1  tablet (15 mg total) by mouth daily. 05/22/24   Magdalen Pasco RAMAN, DPM  rosuvastatin (CRESTOR) 20 MG tablet Take 20 mg by mouth at bedtime. 11/26/23   [provider]  triamcinolone  cream (KENALOG ) 0.1 % Apply 1 Application topically 2 (two) times daily as needed (eczema flares on large body surfaces. Avoid hands, face, feet, neck, genitals). 12/25/23   Orlando Pond, DO  valACYclovir  (VALTREX ) 500 MG tablet Take 1 tablet (500 mg) orally twice daily for 3 days; start within 24 hours of onset of signs and symptoms for outbreak 10/04/23   Dartha Geralds, DO    Allergies: Latex and Penicillins    Review of Systems  Constitutional:  Negative for activity change, appetite change and fever.  HENT:  Negative for congestion and rhinorrhea.   Respiratory:  Positive for chest tightness and shortness of breath.   Cardiovascular:  Positive for chest pain.  Gastrointestinal:  Negative for abdominal pain, nausea and vomiting.  Genitourinary:  Negative for dysuria and hematuria.  Musculoskeletal:  Negative for arthralgias and myalgias.  Neurological:  Negative for dizziness, weakness and headaches.    all other systems are negative except as noted in the HPI and PMH.   Updated Vital Signs BP 118/89   Pulse 83   Temp 97.8 F (36.6 C)   Resp 18   Ht 5' 2 (1.575 m)   Wt  108 kg   SpO2 100%   BMI 43.53 kg/m   Physical Exam Vitals and nursing note reviewed.  Constitutional:      General: She is not in acute distress.    Appearance: She is well-developed.  HENT:     Head: Normocephalic and atraumatic.     Mouth/Throat:     Pharynx: No oropharyngeal exudate.  Eyes:     Conjunctiva/sclera: Conjunctivae normal.     Pupils: Pupils are equal, round, and reactive to light.  Neck:     Comments: No meningismus. Cardiovascular:     Rate and Rhythm: Normal rate and regular rhythm.     Heart sounds: Normal heart sounds. No murmur heard. Pulmonary:     Effort: Pulmonary effort is normal. No  respiratory distress.     Breath sounds: Normal breath sounds.     Comments: Chaperone present.  There is tenderness to palpation of chest wall above left breast.  Breast itself is nontender.  No rash. Chest:     Chest wall: Tenderness present.  Abdominal:     Palpations: Abdomen is soft.     Tenderness: There is no abdominal tenderness. There is no guarding or rebound.  Musculoskeletal:        General: No tenderness. Normal range of motion.     Cervical back: Normal range of motion and neck supple.  Skin:    General: Skin is warm.  Neurological:     Mental Status: She is alert and oriented to person, place, and time.     Cranial Nerves: No cranial nerve deficit.     Motor: No abnormal muscle tone.     Coordination: Coordination normal.     Comments:  5/5 strength throughout. CN 2-12 intact.Equal grip strength.   Psychiatric:        Behavior: Behavior normal.     (all labs ordered are listed, but only abnormal results are displayed) Labs Reviewed  BASIC METABOLIC PANEL WITH GFR - Abnormal; Notable for the following components:      Result Value   Glucose, Bld 105 (*)    All other components within normal limits  D-DIMER, QUANTITATIVE - Abnormal; Notable for the following components:   D-Dimer, Quant 1.39 (*)    All other components within normal limits  CBC  PREGNANCY, URINE  TROPONIN T, HIGH SENSITIVITY  TROPONIN T, HIGH SENSITIVITY    EKG: EKG Interpretation Date/Time:  Saturday August 02 2024 03:09:43 EDT Ventricular Rate:  80 PR Interval:  143 QRS Duration:  85 QT Interval:  376 QTC Calculation: 434 R Axis:   -11  Text Interpretation: Sinus rhythm Low voltage, precordial leads Consider anterior infarct No significant change was found Confirmed by Carita Senior [id number]) on 08/02/2024 5:12:31 AM  Radiology: CT Angio Chest PE W and/or Wo Contrast Result Date: 08/02/2024 EXAM: CTA of the Chest with contrast for PE 08/02/2024 06:16:40 AM TECHNIQUE: CTA of the  chest was performed after the administration of intravenous contrast. Multiplanar reformatted images are provided for review. MIP images are provided for review. Automated exposure control, iterative reconstruction, and/or weight based adjustment of the mA/kV was utilized to reduce the radiation dose to as low as reasonably achievable. COMPARISON: CT of the chest dated 05/07/2024. CLINICAL HISTORY: Pulmonary embolism (PE) suspected, low to intermediate prob, positive D-dimer. FINDINGS: PULMONARY ARTERIES: Pulmonary arteries are adequately opacified for evaluation. No pulmonary embolism. Main pulmonary artery is normal in caliber. MEDIASTINUM: The heart and pericardium demonstrate no acute abnormality. There is no acute abnormality  of the thoracic aorta. LYMPH NODES: No mediastinal, hilar or axillary lymphadenopathy. LUNGS AND PLEURA: The lungs are without acute process. No focal consolidation or pulmonary edema. No pleural effusion or pneumothorax. UPPER ABDOMEN: Limited images of the upper abdomen are unremarkable. SOFT TISSUES AND BONES: No acute bone or soft tissue abnormality. Nodular enlargement of the left lobe of the thyroid  again demonstrated which measures approximately 3.8 cm in transverse dimension. Recommend follow up thyroid  ultrasound. IMPRESSION: 1. No pulmonary embolism. 2. Nodular enlargement of the left lobe of the thyroid , measuring approximately 3.8 cm in transverse dimension. Recommend follow-up thyroid  ultrasound. Electronically signed by: Evalene Coho MD 08/02/2024 06:26 AM EDT RP Workstation: [id number]   DG Chest Port 1 View Result Date: 08/02/2024 EXAM: 1 VIEW XRAY OF THE CHEST 08/02/2024 03:43:16 AM COMPARISON: 11/01/23 CLINICAL HISTORY: Chest pain and SOB that woke her up from sleeping around 0130. Patient states pain is in L chest and radiates to back. Hx CHF. Denies any N/V, dizziness, or diaphoresis. Pain 8/10, squeezing. FINDINGS: LUNGS AND PLEURA: Right basilar atelectasis.  Otherwise no focal pulmonary opacity. No pulmonary edema. No pleural effusion. No pneumothorax. HEART AND MEDIASTINUM: No acute abnormality of the cardiac and mediastinal silhouettes. BONES AND SOFT TISSUES: No acute osseous abnormality. IMPRESSION: 1. No acute process. Electronically signed by: Norman Gatlin MD 08/02/2024 03:46 AM EDT RP Workstation: [id number]     Procedures   Medications Ordered in the ED - No data to display                                  Medical Decision Making Amount and/or Complexity of Data Reviewed Labs: ordered. Decision-making details documented in ED Course. Radiology: ordered and independent interpretation performed. Decision-making details documented in ED Course. ECG/medicine tests: ordered and independent interpretation performed. Decision-making details documented in ED Course.  Risk Prescription drug management.   Left-sided chest pain since 1 AM with back pain and shortness of breath.  Somewhat reproducible to palpation.  Stable vitals.  EKG is normal sinus rhythm without acute ST changes  Normal cardiac catheterization in Auguest 2025. Echocardiogram with ejection fraction 45 to 50% with diastolic dysfunction  CXR normal. Troponin negative. D-dimer positive.  Chest pain seems atypical for ACS and is reproducible with palpation of the chest wall.   CTPE negative for PE and negative for aortic dissection. Patient made aware of thyroid  nodule and need for followup. D/w Dr. Coho.  Troponin negative x2 and pain improved with toradol .  Recent reassuring cardiac catheterization.   D/w patient no evidence of ACS, PE, aortic dissection, pneumonia, pneumothorax. Consider MSK pain or pain from shingles with rash not yet visible. Recommend supportive care and PCP followup. Return to the ED with exertional chest pain, pain associated with shortness of breath, nausea, vomiting, diaphoresis or any other concerns.      Final diagnoses:  Atypical  chest pain    ED Discharge Orders     None          Cheryn Lundquist, Garnette, MD 08/02/24 [phone]

## 2024-08-02 NOTE — Discharge Instructions (Addendum)
 Testing shows no evidence of heart attack or blood clot in the lung.  Your pain seems to be musculoskeletal in origin.  You can take inflammatories as needed for discomfort.  Monitor for rash that could appear as sometimes shingles can cause pain without rash. your CT scan did show an nodule of your thyroid  gland which should be followed up by your primary doctor for an ultrasound. Return to the ED with exertional chest pain, pain associate with shortness of breath, nausea, vomiting, sweating or other concerns

## 2024-08-06 DIAGNOSIS — N644 Mastodynia: Secondary | ICD-10-CM | POA: Diagnosis not present

## 2024-08-06 DIAGNOSIS — Z0001 Encounter for general adult medical examination with abnormal findings: Secondary | ICD-10-CM | POA: Diagnosis not present

## 2024-08-06 DIAGNOSIS — N898 Other specified noninflammatory disorders of vagina: Secondary | ICD-10-CM | POA: Diagnosis not present

## 2024-08-06 DIAGNOSIS — N912 Amenorrhea, unspecified: Secondary | ICD-10-CM | POA: Diagnosis not present

## 2024-08-08 ENCOUNTER — Other Ambulatory Visit: Payer: Self-pay

## 2024-08-08 DIAGNOSIS — F331 Major depressive disorder, recurrent, moderate: Secondary | ICD-10-CM | POA: Diagnosis not present

## 2024-08-08 DIAGNOSIS — R911 Solitary pulmonary nodule: Secondary | ICD-10-CM

## 2024-08-08 DIAGNOSIS — F4310 Post-traumatic stress disorder, unspecified: Secondary | ICD-10-CM | POA: Diagnosis not present

## 2024-08-13 ENCOUNTER — Ambulatory Visit: Admitting: Emergency Medicine

## 2024-08-13 ENCOUNTER — Encounter: Payer: Self-pay | Admitting: Emergency Medicine

## 2024-08-13 VITALS — BP 100/60 | HR 88 | Temp 98.2°F | Ht 63.5 in | Wt 242.0 lb

## 2024-08-13 DIAGNOSIS — R911 Solitary pulmonary nodule: Secondary | ICD-10-CM

## 2024-08-13 DIAGNOSIS — F331 Major depressive disorder, recurrent, moderate: Secondary | ICD-10-CM | POA: Diagnosis not present

## 2024-08-13 DIAGNOSIS — F4310 Post-traumatic stress disorder, unspecified: Secondary | ICD-10-CM | POA: Diagnosis not present

## 2024-08-13 DIAGNOSIS — Z87891 Personal history of nicotine dependence: Secondary | ICD-10-CM | POA: Diagnosis not present

## 2024-08-13 DIAGNOSIS — D869 Sarcoidosis, unspecified: Secondary | ICD-10-CM

## 2024-08-13 DIAGNOSIS — R079 Chest pain, unspecified: Secondary | ICD-10-CM | POA: Diagnosis not present

## 2024-08-13 DIAGNOSIS — R918 Other nonspecific abnormal finding of lung field: Secondary | ICD-10-CM

## 2024-08-13 LAB — PULMONARY FUNCTION TEST
DL/VA % pred: 102 %
DL/VA: 4.56 ml/min/mmHg/L
DLCO cor % pred: 88 %
DLCO cor: 19.01 ml/min/mmHg
DLCO unc % pred: 85 %
DLCO unc: 18.27 ml/min/mmHg
FEF 25-75 Post: 2.2 L/s
FEF 25-75 Pre: 1.89 L/s
FEF2575-%Change-Post: 16 %
FEF2575-%Pred-Post: 71 %
FEF2575-%Pred-Pre: 61 %
FEV1-%Change-Post: 2 %
FEV1-%Pred-Post: 71 %
FEV1-%Pred-Pre: 70 %
FEV1-Post: 2.12 L
FEV1-Pre: 2.08 L
FEV1FVC-%Change-Post: 3 %
FEV1FVC-%Pred-Pre: 96 %
FEV6-%Change-Post: 0 %
FEV6-%Pred-Post: 73 %
FEV6-%Pred-Pre: 73 %
FEV6-Post: 2.6 L
FEV6-Pre: 2.63 L
FEV6FVC-%Pred-Post: 101 %
FEV6FVC-%Pred-Pre: 101 %
FVC-%Change-Post: 0 %
FVC-%Pred-Post: 71 %
FVC-%Pred-Pre: 72 %
FVC-Post: 2.6 L
FVC-Pre: 2.63 L
Post FEV1/FVC ratio: 82 %
Post FEV6/FVC ratio: 100 %
Pre FEV1/FVC ratio: 79 %
Pre FEV6/FVC Ratio: 100 %
RV % pred: 64 %
RV: 1.03 L
TLC % pred: 71 %
TLC: 3.56 L

## 2024-08-13 MED ORDER — ALBUTEROL SULFATE HFA 108 (90 BASE) MCG/ACT IN AERS: 2.0000 | INHALATION_SPRAY | Freq: Four times a day (QID) | RESPIRATORY_TRACT | 6 refills | Status: AC | PRN

## 2024-08-13 NOTE — Patient Instructions (Signed)
 Full PFT performed today.

## 2024-08-13 NOTE — Progress Notes (Signed)
 Subjective:    Patient ID: Connie Jenkins, female    DOB: 09/04/1981, 43 y.o.   MRN: [id number]  HPI   ROV 08/13/2024 --follow-up visit 43 year old woman with a history of asthma, former tobacco, allergies, IBS and pulmonary nodular disease.  She underwent navigational bronchoscopy April 2024 with multiple biopsies that were cytology negative.  Cultures were negative also.  Follow-up imaging 05/03/2023 showed some resolution of numerous nodules, no adenopathy.  She has a slightly elevated angiotensin-converting enzyme level.  Question sarcoidosis.  We performed pulmonary function testing today.  She was in the ED on 08/02/2024 with pain in the center of her chest left breast and upper back area. Some associated SOB.  She had a TTE w [facility], and then cardiac catheterization in 06/2024 that was reassuring.  A CT-PA was done as well that was reassuring as below. Her functional capacity is ok, she gets dyspnea when supine and w long walks. She has had GERD before, unclear how actiuve right now.  She also reports that she has an inguinal lymph node bx since I last saw her > showed granulomas.   CT-PA 08/02/2024 reviewed by me, shows no evidence of pulmonary embolism no mediastinal hilar or axillary adenopathy, resolution of her pulmonary nodules without any consolidation or edema.  There was a nodular enlargement of the left lobe of the thyroid  3.8 cm and a thyroid  ultrasound was recommended.  Pulmonary function testing performed today and reviewed by me shows possible mixed obstruction and restriction without a bronchodilator response, restricted lung volumes, normal diffusion capacity.  Flow-volume loop has some very mild curve that suggests obstruction.   Review of Systems As per HPI  Past Medical History:  Diagnosis Date   Anxiety    no meds   Asthma    no problems, no inhaler   Carpal tunnel syndrome, bilateral 06/07/2022   in [place]   Depression    no current Tx.   Dysuria 02/09/2021   Eczema     Gestational diabetes    resolved after delivery   Headache    Herpes 2008   genital   History of kidney stones    surg x 1 and passed stones x 1   IBS (irritable bowel syndrome)    Nephrolithiasis 01/03/2022   Pneumonia    x 1   Pulmonary nodules 01/2023   bilateral scattered pulmonary nodules   Renal stone 08/26/2014   surgery to remove   Seasonal allergies    Thyroid  nodule 01/2023   large, on left lobe     Family History  Problem Relation Age of Onset   Hypertension Mother    Diabetes Mother      Social History   Socioeconomic History   Marital status: Single    Spouse name: Not on file   Number of children: Not on file   Years of education: Not on file   Highest education level: Not on file  Occupational History   Not on file  Tobacco Use   Smoking status: Former    Types: Cigars    Passive exposure: Never   Smokeless tobacco: Never   Tobacco comments:    Pt smoked Cigars for 8 months. ARJ 02/20/23  Vaping Use   Vaping status: Never Used  Substance and Sexual Activity   Alcohol use: Not Currently    Alcohol/week: 1.0 standard drink of alcohol    Types: 1 Standard drinks or equivalent per week    Comment: occasionally   Drug use:  No   Sexual activity: Not Currently    Partners: Male    Birth control/protection: None  Other Topics Concern   Not on file  Social History Narrative   Not on file   Social Drivers of Health   Financial Resource Strain: Low Risk  (07/23/2024)   Received from [facility]   Overall Financial Resource Strain (CARDIA)    How hard is it for you to pay for the very basics like food, housing, medical care, and heating?: Not hard at all  Food Insecurity: Low Risk  (07/25/2024)   Received from [facility]   Hunger Vital Sign    Within the past 12 months, you worried that your food would run out before you got money to buy more: Never true    Within the past 12 months, the food you bought just didn't last and you didn't have  money to get more. : Never true  Transportation Needs: No Transportation Needs (07/25/2024)   Received from [facility]    In the past 12 months, has lack of reliable transportation kept you from medical appointments, meetings, work or from getting things needed for daily living? : No  Physical Activity: Not on file  Stress: Not on file  Social Connections: Unknown (04/04/2022)   Received from [facility]   Social Network    Social Network: Not on file  Intimate Partner Violence: Unknown (02/28/2022)   Received from [facility]   HITS    Physically Hurt: Not on file    Insult or Talk Down To: Not on file    Threaten Physical Harm: Not on file    Scream or Curse: Not on file    She is in school, also works in deliveries She is worked in [employer] before, no other exposures except for dust Negative TB test 1 year ago No military [place]  native  Allergies  Allergen Reactions   Latex Itching and Swelling   Penicillins Hives, Other (See Comments) and Rash    Childhood allergy Has patient had a PCN reaction causing immediate rash, facial/tongue/throat swelling, SOB or lightheadedness with hypotension: unknown Has patient had a PCN reaction causing severe rash involving mucus membranes or skin necrosis: unknown Has patient had a PCN reaction that required hospitalization unknown Has patient had a PCN reaction occurring within the last 10 years: no If all of the above answers are NO, then may proceed with Cephalosporin use.   Childhood allergy Has patient had a PCN reaction causing immediate rash, facial/tongue/throat swelling, SOB or lightheadedness with hypotension: unknown Has patient had a PCN reaction causing severe rash involving mucus membranes or skin necrosis: unknown Has patient had a PCN reaction that required hospitalization unknown Has patient had a PCN reaction occurring within the last 10 years: no If all of the above answers are NO,  then may proceed with Cephalosporin use.     Outpatient Medications Prior to Visit  Medication Sig Dispense Refill   betamethasone  valerate ointment (VALISONE ) 0.1 % Apply 1 Application topically 2 (two) times daily. Avoid use on face/neck, hands, feet. 30 g 0   cetirizine  (ZYRTEC ) 10 MG tablet Take 1 tablet (10 mg total) by mouth daily. 30 tablet 1   hydrocortisone  2.5 % ointment Apply topically 2 (two) times daily. As needed for mild eczema.  Do not use for more than 1-2 weeks at a time. 30 g 3   losartan (COZAAR) 25 MG tablet Take 25 mg by mouth daily.  meloxicam  (MOBIC ) 15 MG tablet Take 1 tablet (15 mg total) by mouth daily. 30 tablet 2   rosuvastatin (CRESTOR) 20 MG tablet Take 20 mg by mouth at bedtime.     triamcinolone  cream (KENALOG ) 0.1 % Apply 1 Application topically 2 (two) times daily as needed (eczema flares on large body surfaces. Avoid hands, face, feet, neck, genitals). 80 g 0   valACYclovir  (VALTREX ) 500 MG tablet Take 1 tablet (500 mg) orally twice daily for 3 days; start within 24 hours of onset of signs and symptoms for outbreak 30 tablet 0   WEGOVY 0.25 MG/0.5ML SOAJ SQ injection Inject 0.25 mg into the skin.     No facility-administered medications prior to visit.        Objective:   Physical Exam Vitals:   08/13/24 1015  BP: 100/60  Pulse: 88  Temp: 98.2 F (36.8 C)  TempSrc: Oral  SpO2: 95%  Weight: 242 lb (109.8 kg)  Height: 5' 3.5 (1.613 m)   Gen: Pleasant, well-nourished, in no distress,  normal affect  ENT: No lesions,  mouth clear,  oropharynx clear, no postnasal drip  Neck: No JVD, no stridor  Lungs: No use of accessory muscles, no crackles or wheezing on normal respiration, no wheeze on forced expiration  Cardiovascular: RRR, heart sounds normal, no murmur or gallops, no peripheral edema  Musculoskeletal: No deformities, no cyanosis or clubbing  Neuro: alert, awake, non focal  Skin: Warm, no lesions or rash      Assessment &  Plan:   Pulmonary nodules Based on our bronchoscopy results which were reassuring and her subsequent workup that included her inguinal lymph node biopsy, I think there is enough data to support a diagnosis of sarcoidosis.  Sarcoidosis Her pulmonary nodules have resolved on CT chest.  She does have some obstructive lung disease that would be consistent with sarcoid.  I do not think she needs scheduled BD therapy right now but I would like for her to have albuterol  available to use if needed.  She has undergone a cardiac evaluation and likely will need either a cardiac MRI or PET to ensure no evidence of cardiac sarcoid.  I also recommended that she follow-up with ophthalmology.    We reviewed your lymph node biopsy today.  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  I do not see any evidence of active sarcoidosis in your lungs currently based on your most recent CT scan of the chest. Your pulmonary function testing shows some evidence of mild asthma.  We will send a prescription for albuterol  for you to have this available to use if needed for shortness of breath.  We will hold off on starting a scheduled inhaler medication for now. Please discuss the diagnosis of sarcoidosis with your cardiologist.  There is probably other workup that will need to be done to ensure that sarcoid is not impacting your heart and your heart function. You need to see an ophthalmologist (eye doctor) annually. Follow Dr. Shelah in 6 months, sooner if you have any problems.  Chest pain I think her chest pain may be consistent with GERD and I will ask her to start an empiric PPI.  Her cardiac catheterization was reassuring and I do not see any evidence of active sarcoid in the chest/lungs.  Please try starting over-the-counter omeprazole (generic Prilosec) 20 mg once daily to see if this helps with the chest discomfort that you have been experiencing.  Time spent 45 minutes reviewing studies, history from [facility]  and the [facility], symptoms, the diagnosis of sarcoidosis and plan for future evaluation including appropriate cardiac workup, ophthalmology follow-up.  Lamar Chris, MD, PhD 08/13/2024, 1:15 PM [facility] [phone] or if no answer before 7:00PM call [phone] For any issues after 7:00PM please call eLink [phone]

## 2024-08-13 NOTE — Patient Instructions (Signed)
 We reviewed your lymph node biopsy today.  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  I do not see any evidence of active sarcoidosis in your lungs currently based on your most recent CT scan of the chest. Your pulmonary function testing shows some evidence of mild asthma.  We will send a prescription for albuterol  for you to have this available to use if needed for shortness of breath.  We will hold off on starting a scheduled inhaler medication for now. Please discuss the diagnosis of sarcoidosis with your cardiologist.  There is probably other workup that will need to be done to ensure that sarcoid is not impacting your heart and your heart function. You need to see an ophthalmologist (eye doctor) annually. Please try starting over-the-counter omeprazole (generic Prilosec) 20 mg once daily to see if this helps with the chest discomfort that you have been experiencing. Follow Dr. Shelah in 6 months, sooner if you have any problems.

## 2024-08-13 NOTE — Assessment & Plan Note (Signed)
 Her pulmonary nodules have resolved on CT chest.  She does have some obstructive lung disease that would be consistent with sarcoid.  I do not think she needs scheduled BD therapy right now but I would like for her to have albuterol  available to use if needed.  She has undergone a cardiac evaluation and likely will need either a cardiac MRI or PET to ensure no evidence of cardiac sarcoid.  I also recommended that she follow-up with ophthalmology.    We reviewed your lymph node biopsy today.  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  I do not see any evidence of active sarcoidosis in your lungs currently based on your most recent CT scan of the chest. Your pulmonary function testing shows some evidence of mild asthma.  We will send a prescription for albuterol  for you to have this available to use if needed for shortness of breath.  We will hold off on starting a scheduled inhaler medication for now. Please discuss the diagnosis of sarcoidosis with your cardiologist.  There is probably other workup that will need to be done to ensure that sarcoid is not impacting your heart and your heart function. You need to see an ophthalmologist (eye doctor) annually. Follow Dr. Shelah in 6 months, sooner if you have any problems.

## 2024-08-13 NOTE — Assessment & Plan Note (Signed)
 I think her chest pain may be consistent with GERD and I will ask her to start an empiric PPI.  Her cardiac catheterization was reassuring and I do not see any evidence of active sarcoid in the chest/lungs.  Please try starting over-the-counter omeprazole (generic Prilosec) 20 mg once daily to see if this helps with the chest discomfort that you have been experiencing.

## 2024-08-13 NOTE — Assessment & Plan Note (Signed)
 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

## 2024-08-13 NOTE — Progress Notes (Signed)
 Full PFT performed today.

## 2024-08-14 ENCOUNTER — Telehealth: Payer: Self-pay | Admitting: *Deleted

## 2024-08-14 DIAGNOSIS — E041 Nontoxic single thyroid nodule: Secondary | ICD-10-CM | POA: Diagnosis not present

## 2024-08-14 DIAGNOSIS — E559 Vitamin D deficiency, unspecified: Secondary | ICD-10-CM | POA: Diagnosis not present

## 2024-08-14 DIAGNOSIS — D869 Sarcoidosis, unspecified: Secondary | ICD-10-CM | POA: Diagnosis not present

## 2024-08-14 DIAGNOSIS — R8290 Unspecified abnormal findings in urine: Secondary | ICD-10-CM | POA: Diagnosis not present

## 2024-08-14 DIAGNOSIS — R591 Generalized enlarged lymph nodes: Secondary | ICD-10-CM | POA: Diagnosis not present

## 2024-08-14 NOTE — Telephone Encounter (Signed)
 We reviewed your lymph node biopsy today.  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  I do not see any evidence of active sarcoidosis in your lungs currently based on your most recent CT scan of the chest. Your pulmonary function testing shows some evidence of mild asthma.  We will send a prescription for albuterol  for you to have this available to use if needed for shortness of breath.  We will hold off on starting a scheduled inhaler medication for now. Please discuss the diagnosis of sarcoidosis with your cardiologist.  There is probably other workup that will need to be done to ensure that sarcoid is not impacting your heart and your heart function. You need to see an ophthalmologist (eye doctor) annually. Follow Dr. Shelah in 6 months, sooner if you have any problems.   Issue resolved patient's cardiologist will be ordering additional test. NFN

## 2024-08-15 DIAGNOSIS — N912 Amenorrhea, unspecified: Secondary | ICD-10-CM | POA: Diagnosis not present

## 2024-08-15 DIAGNOSIS — E66813 Obesity, class 3: Secondary | ICD-10-CM | POA: Diagnosis not present

## 2024-08-15 DIAGNOSIS — N951 Menopausal and female climacteric states: Secondary | ICD-10-CM | POA: Diagnosis not present

## 2024-08-15 DIAGNOSIS — Z6841 Body Mass Index (BMI) 40.0 and over, adult: Secondary | ICD-10-CM | POA: Diagnosis not present

## 2024-08-15 DIAGNOSIS — N858 Other specified noninflammatory disorders of uterus: Secondary | ICD-10-CM | POA: Diagnosis not present

## 2024-08-18 DIAGNOSIS — E041 Nontoxic single thyroid nodule: Secondary | ICD-10-CM | POA: Diagnosis not present

## 2024-09-10 ENCOUNTER — Ambulatory Visit: Admitting: Emergency Medicine

## 2024-09-10 ENCOUNTER — Encounter

## 2024-09-12 DIAGNOSIS — F331 Major depressive disorder, recurrent, moderate: Secondary | ICD-10-CM | POA: Diagnosis not present

## 2024-09-12 DIAGNOSIS — F4310 Post-traumatic stress disorder, unspecified: Secondary | ICD-10-CM | POA: Diagnosis not present

## 2024-09-18 DIAGNOSIS — R350 Frequency of micturition: Secondary | ICD-10-CM | POA: Diagnosis not present

## 2024-09-19 DIAGNOSIS — F4323 Adjustment disorder with mixed anxiety and depressed mood: Secondary | ICD-10-CM | POA: Diagnosis not present

## 2024-09-25 DIAGNOSIS — F4323 Adjustment disorder with mixed anxiety and depressed mood: Secondary | ICD-10-CM | POA: Diagnosis not present

## 2024-10-02 DIAGNOSIS — F331 Major depressive disorder, recurrent, moderate: Secondary | ICD-10-CM | POA: Diagnosis not present

## 2024-10-02 DIAGNOSIS — F4310 Post-traumatic stress disorder, unspecified: Secondary | ICD-10-CM | POA: Diagnosis not present

## 2024-10-03 DIAGNOSIS — M79605 Pain in left leg: Secondary | ICD-10-CM | POA: Diagnosis not present

## 2024-10-03 DIAGNOSIS — G4719 Other hypersomnia: Secondary | ICD-10-CM | POA: Diagnosis not present

## 2024-10-03 DIAGNOSIS — M79604 Pain in right leg: Secondary | ICD-10-CM | POA: Diagnosis not present

## 2024-10-03 DIAGNOSIS — Z111 Encounter for screening for respiratory tuberculosis: Secondary | ICD-10-CM | POA: Diagnosis not present

## 2024-10-03 DIAGNOSIS — D869 Sarcoidosis, unspecified: Secondary | ICD-10-CM | POA: Diagnosis not present

## 2024-10-06 DIAGNOSIS — E66813 Obesity, class 3: Secondary | ICD-10-CM | POA: Diagnosis not present

## 2024-10-06 DIAGNOSIS — I5020 Unspecified systolic (congestive) heart failure: Secondary | ICD-10-CM | POA: Diagnosis not present

## 2024-10-06 DIAGNOSIS — R0602 Shortness of breath: Secondary | ICD-10-CM | POA: Diagnosis not present

## 2024-10-06 DIAGNOSIS — R29818 Other symptoms and signs involving the nervous system: Secondary | ICD-10-CM | POA: Diagnosis not present

## 2024-10-06 DIAGNOSIS — Z6841 Body Mass Index (BMI) 40.0 and over, adult: Secondary | ICD-10-CM | POA: Diagnosis not present

## 2024-10-06 DIAGNOSIS — D869 Sarcoidosis, unspecified: Secondary | ICD-10-CM | POA: Diagnosis not present

## 2024-10-09 DIAGNOSIS — F4310 Post-traumatic stress disorder, unspecified: Secondary | ICD-10-CM | POA: Diagnosis not present

## 2024-10-09 DIAGNOSIS — F331 Major depressive disorder, recurrent, moderate: Secondary | ICD-10-CM | POA: Diagnosis not present

## 2024-10-14 DIAGNOSIS — M79605 Pain in left leg: Secondary | ICD-10-CM | POA: Diagnosis not present

## 2024-10-15 DIAGNOSIS — F4323 Adjustment disorder with mixed anxiety and depressed mood: Secondary | ICD-10-CM | POA: Diagnosis not present

## 2024-10-28 DIAGNOSIS — F4310 Post-traumatic stress disorder, unspecified: Secondary | ICD-10-CM | POA: Diagnosis not present

## 2024-10-28 DIAGNOSIS — F331 Major depressive disorder, recurrent, moderate: Secondary | ICD-10-CM | POA: Diagnosis not present

## 2024-10-29 ENCOUNTER — Ambulatory Visit: Admitting: Dermatology

## 2024-11-03 DIAGNOSIS — F4323 Adjustment disorder with mixed anxiety and depressed mood: Secondary | ICD-10-CM | POA: Diagnosis not present

## 2024-11-04 DIAGNOSIS — R519 Headache, unspecified: Secondary | ICD-10-CM | POA: Diagnosis not present

## 2024-11-04 DIAGNOSIS — M79644 Pain in right finger(s): Secondary | ICD-10-CM | POA: Diagnosis not present

## 2024-11-04 DIAGNOSIS — Z9889 Other specified postprocedural states: Secondary | ICD-10-CM | POA: Diagnosis not present

## 2024-11-04 DIAGNOSIS — L304 Erythema intertrigo: Secondary | ICD-10-CM | POA: Diagnosis not present

## 2024-11-04 DIAGNOSIS — N898 Other specified noninflammatory disorders of vagina: Secondary | ICD-10-CM | POA: Diagnosis not present

## 2024-11-06 DIAGNOSIS — R0683 Snoring: Secondary | ICD-10-CM | POA: Diagnosis not present

## 2024-11-11 DIAGNOSIS — R079 Chest pain, unspecified: Secondary | ICD-10-CM | POA: Diagnosis not present

## 2024-11-11 DIAGNOSIS — I5020 Unspecified systolic (congestive) heart failure: Secondary | ICD-10-CM | POA: Diagnosis not present

## 2024-11-11 DIAGNOSIS — D860 Sarcoidosis of lung: Secondary | ICD-10-CM | POA: Diagnosis not present

## 2024-11-11 DIAGNOSIS — F331 Major depressive disorder, recurrent, moderate: Secondary | ICD-10-CM | POA: Diagnosis not present

## 2024-11-11 DIAGNOSIS — F4310 Post-traumatic stress disorder, unspecified: Secondary | ICD-10-CM | POA: Diagnosis not present

## 2024-11-13 DIAGNOSIS — R200 Anesthesia of skin: Secondary | ICD-10-CM | POA: Diagnosis not present

## 2024-11-13 DIAGNOSIS — R202 Paresthesia of skin: Secondary | ICD-10-CM | POA: Diagnosis not present

## 2024-11-13 DIAGNOSIS — M180 Bilateral primary osteoarthritis of first carpometacarpal joints: Secondary | ICD-10-CM | POA: Diagnosis not present

## 2024-11-14 DIAGNOSIS — I5020 Unspecified systolic (congestive) heart failure: Secondary | ICD-10-CM | POA: Diagnosis not present

## 2024-11-14 DIAGNOSIS — Z6841 Body Mass Index (BMI) 40.0 and over, adult: Secondary | ICD-10-CM | POA: Diagnosis not present

## 2024-11-14 DIAGNOSIS — E66813 Obesity, class 3: Secondary | ICD-10-CM | POA: Diagnosis not present

## 2024-11-14 DIAGNOSIS — D860 Sarcoidosis of lung: Secondary | ICD-10-CM | POA: Diagnosis not present

## 2024-11-24 DIAGNOSIS — F331 Major depressive disorder, recurrent, moderate: Secondary | ICD-10-CM | POA: Diagnosis not present

## 2024-11-24 DIAGNOSIS — F4310 Post-traumatic stress disorder, unspecified: Secondary | ICD-10-CM | POA: Diagnosis not present
